# Patient Record
Sex: Female | Born: 1945
Health system: Southern US, Community
[De-identification: ages and names within clinical notes are randomized; demographics above are authoritative.]

## PROBLEM LIST (undated history)

## (undated) DIAGNOSIS — E785 Hyperlipidemia, unspecified: Secondary | ICD-10-CM

## (undated) DIAGNOSIS — G20 Parkinson's disease: Secondary | ICD-10-CM

## (undated) DIAGNOSIS — M1990 Unspecified osteoarthritis, unspecified site: Secondary | ICD-10-CM

## (undated) DIAGNOSIS — E7800 Pure hypercholesterolemia, unspecified: Secondary | ICD-10-CM

## (undated) DIAGNOSIS — F419 Anxiety disorder, unspecified: Secondary | ICD-10-CM

## (undated) DIAGNOSIS — K589 Irritable bowel syndrome without diarrhea: Secondary | ICD-10-CM

## (undated) DIAGNOSIS — G2581 Restless legs syndrome: Secondary | ICD-10-CM

## (undated) DIAGNOSIS — I639 Cerebral infarction, unspecified: Secondary | ICD-10-CM

## (undated) DIAGNOSIS — G20A1 Parkinson's disease without dyskinesia, without mention of fluctuations: Secondary | ICD-10-CM

## (undated) DIAGNOSIS — G9050 Complex regional pain syndrome I, unspecified: Secondary | ICD-10-CM

## (undated) HISTORY — PX: BACK SURGERY: SHX140

## (undated) HISTORY — DX: Parkinson's disease without dyskinesia, without mention of fluctuations: G20.A1

## (undated) HISTORY — PX: APPENDECTOMY: SHX54

## (undated) HISTORY — PX: CATARACT EXTRACTION: SUR2

## (undated) HISTORY — PX: COLONOSCOPY: SHX174

## (undated) HISTORY — DX: Parkinson's disease: G20

## (undated) HISTORY — PX: EYE SURGERY: SHX253

## (undated) HISTORY — PX: FRACTURE SURGERY: SHX138

---

## 2005-02-15 ENCOUNTER — Ambulatory Visit: Payer: Self-pay | Admitting: Internal Medicine

## 2005-05-03 ENCOUNTER — Ambulatory Visit: Payer: Self-pay

## 2005-11-27 ENCOUNTER — Ambulatory Visit: Payer: Self-pay | Admitting: Physician Assistant

## 2005-11-30 ENCOUNTER — Observation Stay (HOSPITAL_COMMUNITY): Admission: EM | Admit: 2005-11-30 | Discharge: 2005-12-01 | Payer: Self-pay | Admitting: Emergency Medicine

## 2006-03-25 ENCOUNTER — Ambulatory Visit: Payer: Self-pay | Admitting: Internal Medicine

## 2007-04-07 ENCOUNTER — Ambulatory Visit: Payer: Self-pay | Admitting: Internal Medicine

## 2008-05-03 ENCOUNTER — Ambulatory Visit: Payer: Self-pay | Admitting: Internal Medicine

## 2008-11-25 HISTORY — PX: PARS PLANA VITRECTOMY: SHX2166

## 2009-05-04 ENCOUNTER — Ambulatory Visit: Payer: Self-pay | Admitting: Internal Medicine

## 2010-06-13 ENCOUNTER — Ambulatory Visit: Payer: Self-pay | Admitting: Internal Medicine

## 2010-06-25 ENCOUNTER — Ambulatory Visit: Payer: Self-pay | Admitting: Internal Medicine

## 2011-01-01 ENCOUNTER — Ambulatory Visit: Payer: Self-pay | Admitting: Internal Medicine

## 2011-07-23 ENCOUNTER — Ambulatory Visit: Payer: Self-pay | Admitting: Internal Medicine

## 2012-07-23 ENCOUNTER — Ambulatory Visit: Payer: Self-pay | Admitting: Internal Medicine

## 2013-07-27 ENCOUNTER — Ambulatory Visit: Payer: Self-pay | Admitting: Internal Medicine

## 2013-08-06 ENCOUNTER — Ambulatory Visit: Payer: Self-pay | Admitting: Internal Medicine

## 2013-08-16 ENCOUNTER — Ambulatory Visit: Payer: Self-pay | Admitting: Family Medicine

## 2013-08-16 HISTORY — PX: BREAST BIOPSY: SHX20

## 2013-08-17 LAB — PATHOLOGY REPORT

## 2014-06-24 DIAGNOSIS — G8929 Other chronic pain: Secondary | ICD-10-CM | POA: Insufficient documentation

## 2014-06-24 DIAGNOSIS — F419 Anxiety disorder, unspecified: Secondary | ICD-10-CM | POA: Insufficient documentation

## 2014-06-24 DIAGNOSIS — G2581 Restless legs syndrome: Secondary | ICD-10-CM | POA: Insufficient documentation

## 2014-09-06 ENCOUNTER — Ambulatory Visit: Payer: Self-pay | Admitting: Family Medicine

## 2014-12-23 DIAGNOSIS — E7800 Pure hypercholesterolemia, unspecified: Secondary | ICD-10-CM | POA: Insufficient documentation

## 2015-06-20 ENCOUNTER — Ambulatory Visit
Admission: RE | Admit: 2015-06-20 | Discharge: 2015-06-20 | Disposition: A | Discharge status: Home / Self Care | Payer: PPO | Source: Ambulatory Visit | Attending: Gastroenterology | Admitting: Gastroenterology

## 2015-06-20 ENCOUNTER — Encounter
Admission: RE | Disposition: A | Discharge status: Home / Self Care | Payer: Self-pay | Source: Ambulatory Visit | Attending: Gastroenterology

## 2015-06-20 ENCOUNTER — Ambulatory Visit: Payer: PPO | Admitting: *Deleted

## 2015-06-20 DIAGNOSIS — F419 Anxiety disorder, unspecified: Secondary | ICD-10-CM | POA: Insufficient documentation

## 2015-06-20 DIAGNOSIS — E785 Hyperlipidemia, unspecified: Secondary | ICD-10-CM | POA: Insufficient documentation

## 2015-06-20 DIAGNOSIS — Z1211 Encounter for screening for malignant neoplasm of colon: Secondary | ICD-10-CM | POA: Insufficient documentation

## 2015-06-20 DIAGNOSIS — Z79899 Other long term (current) drug therapy: Secondary | ICD-10-CM | POA: Insufficient documentation

## 2015-06-20 DIAGNOSIS — K589 Irritable bowel syndrome without diarrhea: Secondary | ICD-10-CM | POA: Diagnosis not present

## 2015-06-20 DIAGNOSIS — K5730 Diverticulosis of large intestine without perforation or abscess without bleeding: Secondary | ICD-10-CM | POA: Insufficient documentation

## 2015-06-20 DIAGNOSIS — G9050 Complex regional pain syndrome I, unspecified: Secondary | ICD-10-CM | POA: Insufficient documentation

## 2015-06-20 DIAGNOSIS — G2581 Restless legs syndrome: Secondary | ICD-10-CM | POA: Insufficient documentation

## 2015-06-20 HISTORY — DX: Hyperlipidemia, unspecified: E78.5

## 2015-06-20 HISTORY — DX: Restless legs syndrome: G25.81

## 2015-06-20 HISTORY — PX: COLONOSCOPY WITH PROPOFOL: SHX5780

## 2015-06-20 HISTORY — DX: Complex regional pain syndrome I, unspecified: G90.50

## 2015-06-20 HISTORY — DX: Irritable bowel syndrome, unspecified: K58.9

## 2015-06-20 HISTORY — DX: Anxiety disorder, unspecified: F41.9

## 2015-06-20 SURGERY — COLONOSCOPY WITH PROPOFOL
Anesthesia: General

## 2015-06-20 MED ORDER — EPHEDRINE SULFATE 50 MG/ML IJ SOLN
INTRAMUSCULAR | Status: DC | PRN
  Administered 2015-06-20: 10 mg via INTRAVENOUS

## 2015-06-20 MED ORDER — SODIUM CHLORIDE 0.9 % IV SOLN: INTRAVENOUS | Status: DC

## 2015-06-20 MED ORDER — LACTATED RINGERS IV SOLN
INTRAVENOUS | Status: DC | PRN
  Administered 2015-06-20: 08:00:00 via INTRAVENOUS

## 2015-06-20 MED ORDER — SODIUM CHLORIDE 0.9 % IV SOLN
INTRAVENOUS | Status: DC
  Administered 2015-06-20: 1000 mL via INTRAVENOUS

## 2015-06-20 MED ORDER — PROPOFOL INFUSION 10 MG/ML OPTIME
INTRAVENOUS | Status: DC | PRN
  Administered 2015-06-20: 30 mL via INTRAVENOUS

## 2015-06-20 MED ORDER — PHENYLEPHRINE HCL 10 MG/ML IJ SOLN
INTRAMUSCULAR | Status: DC | PRN
  Administered 2015-06-20: 50 ug via INTRAVENOUS

## 2015-06-20 NOTE — Op Note (Signed)
[facility] Patient Name: Deborah Velazquez Procedure Date: 06/20/2015 7:30 AM MRN: [id number] Account #: [id number] Date of Birth: Apr 05, 1946 Admit Type: Outpatient Age: 69 Room: [facility] ENDO ROOM 4 Gender: Female Note Status: Finalized Procedure:         Colonoscopy Indications:       Screening for colorectal malignant neoplasm Providers:         Lollie Sails, MD Referring MD:      Caprice Renshaw (Referring MD) Medicines:         Monitored Anesthesia Care Complications:     No immediate complications. Procedure:         Pre-Anesthesia Assessment:                    - ASA Grade Assessment: II - A patient with mild systemic                     disease.                    After obtaining informed consent, the colonoscope was                     passed under direct vision. Throughout the procedure, the                     patient's blood pressure, pulse, and oxygen saturations                     were monitored continuously. The Colonoscope was                     introduced through the anus and advanced to the the cecum,                     identified by appendiceal orifice and ileocecal valve. The                     colonoscopy was unusually difficult due to a tortuous                     colon. Successful completion of the procedure was aided by                     changing the patient to a supine position and using manual                     pressure. The patient tolerated the procedure well. The                     quality of the bowel preparation was good. Findings:      A few small-mouthed diverticula were found in the transverse colon.      The exam was otherwise without abnormality.      The digital rectal exam was normal.      Non-bleeding internal hemorrhoids were found during anoscopy. The       hemorrhoids were small.      A few small angioectasias without bleeding were found in the rectum,       deeep to the mucosa. Impression:         - Diverticulosis in the transverse colon.                    - The examination was otherwise normal.                    -  No specimens collected. Recommendation:    - Repeat colonoscopy in 10 years for screening purposes. Procedure Code(s): --- Professional ---                    [id number], Colonoscopy, flexible; diagnostic, including                     collection of specimen(s) by brushing or washing, when                     performed (separate procedure) Diagnosis Code(s): --- Professional ---                    V76.51, Special screening for malignant neoplasms of colon                    562.10, Diverticulosis of colon (without mention of                     hemorrhage) CPT copyright 2014 American Medical Association. All rights reserved. The codes documented in this report are preliminary and upon coder review may  be revised to meet current compliance requirements. Lollie Sails, MD 06/20/2015 8:50:38 AM This report has been signed electronically. Number of Addenda: 0 Note Initiated On: 06/20/2015 7:30 AM Scope Withdrawal Time: 0 hours 6 minutes 54 seconds  Total Procedure Duration: 0 hours 33 minutes 28 seconds       [facility]

## 2015-06-20 NOTE — Anesthesia Postprocedure Evaluation (Signed)
  Anesthesia Post-op Note  Patient: Deborah Velazquez  Procedure(s) Performed: Procedure(s): COLONOSCOPY WITH PROPOFOL (N/A)  Anesthesia type:General  Patient location: PACU  Post pain: Pain level controlled  Post assessment: Post-op Vital signs reviewed, Patient's Cardiovascular Status Stable, Respiratory Function Stable, Patent Airway and No signs of Nausea or vomiting  Post vital signs: Reviewed and stable  Last Vitals:  Filed Vitals:   06/20/15 0920  BP: 116/74  Pulse: 64  Temp:   Resp: 20    Level of consciousness: awake, alert  and patient cooperative  Complications: No apparent anesthesia complications

## 2015-06-20 NOTE — Anesthesia Preprocedure Evaluation (Signed)
Anesthesia Evaluation  Patient identified by MRN, date of birth, ID band Patient awake    Reviewed: Allergy & Precautions, NPO status , Patient's Chart, lab work & pertinent test results  Airway Mallampati: II       Dental no notable dental hx.    Pulmonary neg pulmonary ROS,    Pulmonary exam normal       Cardiovascular negative cardio ROS Normal cardiovascular exam    Neuro/Psych    GI/Hepatic negative GI ROS, Neg liver ROS,   Endo/Other  negative endocrine ROS  Renal/GU negative Renal ROS     Musculoskeletal negative musculoskeletal ROS (+)   Abdominal Normal abdominal exam  (+)   Peds negative pediatric ROS (+)  Hematology negative hematology ROS (+)   Anesthesia Other Findings   Reproductive/Obstetrics negative OB ROS                             Anesthesia Physical Anesthesia Plan  ASA: II  Anesthesia Plan: General   Post-op Pain Management:    Induction: Intravenous  Airway Management Planned: Nasal Cannula  Additional Equipment:   Intra-op Plan:   Post-operative Plan:   Informed Consent: I have reviewed the patients History and Physical, chart, labs and discussed the procedure including the risks, benefits and alternatives for the proposed anesthesia with the patient or authorized representative who has indicated his/her understanding and acceptance.     Plan Discussed with: CRNA  Anesthesia Plan Comments:         Anesthesia Quick Evaluation

## 2015-06-20 NOTE — H&P (Signed)
Outpatient short stay form Pre-procedure 06/20/2015 8:01 AM Lollie Sails MD  Primary Physician: Dr. [name]  Reason for visit:  Colonoscopy  History of present illness:  Patient is a 69 year old female presenting today for screening colonoscopy. Her last colonoscopy was in 2006. She is never had polyps on a colonoscopy. There is no family history of colon cancer or colon polyps. She tolerated her prep well. She takes no aspirin's or anticoagulation products.    Current facility-administered medications:  .  0.9 %  sodium chloride infusion, , Intravenous, Continuous, Lollie Sails, MD, Last Rate: 20 mL/hr at 06/20/15 0726, 1,000 mL at 06/20/15 0726 .  0.9 %  sodium chloride infusion, , Intravenous, Continuous, Lollie Sails, MD  Prescriptions prior to admission  Medication Sig Dispense Refill Last Dose  . ALPRAZolam (XANAX) 0.5 MG tablet Take 0.5 mg by mouth at bedtime as needed for anxiety.   06/19/2015  . atorvastatin (LIPITOR) 20 MG tablet Take 20 mg by mouth daily.   06/19/2015  . co-enzyme Q-10 30 MG capsule Take 30 mg by mouth 3 (three) times daily.   06/19/2015  . ibuprofen (ADVIL,MOTRIN) 200 MG tablet Take 200 mg by mouth every 6 (six) hours as needed.   06/19/2015  . omega-3 fish oil (MAXEPA) 1000 MG CAPS capsule Take 1 capsule by mouth 2 (two) times daily.   06/19/2015  . pramipexole (MIRAPEX) 0.5 MG tablet Take 0.5 mg by mouth 3 (three) times daily.   06/19/2015  . sertraline (ZOLOFT) 100 MG tablet Take 100 mg by mouth daily.   06/19/2015     Allergies  Allergen Reactions  . Sudafed [Pseudoephedrine]      Past Medical History  Diagnosis Date  . Anxiety   . Hyperlipemia   . Restless leg syndrome   . Irritable bowel syndrome   . RSD (reflex sympathetic dystrophy)     Review of systems:      Physical Exam    Heart and lungs: Regular rate and rhythm without rub or gallop, lungs are bilaterally clear    HEENT: Norm cephalic atraumatic eyes are  anicteric    Other:     Pertinant exam for procedure: Soft nontender nondistended bowel sounds positive normoactive    Planned proceedures: Colonoscopy and indicated procedures I have discussed the risks benefits and complications of procedures to include not limited to bleeding, infection, perforation and the risk of sedation and the patient wishes to proceed.    Lollie Sails, MD Gastroenterology 06/20/2015  8:01 AM

## 2015-06-20 NOTE — Transfer of Care (Signed)
Immediate Anesthesia Transfer of Care Note  Patient: Deborah Velazquez  Procedure(s) Performed: Procedure(s): COLONOSCOPY WITH PROPOFOL (N/A)  Patient Location: PACU  Anesthesia Type:General  Level of Consciousness: sedated  Airway & Oxygen Therapy: Patient Spontanous Breathing  Post-op Assessment: Report given to RN and Post -op Vital signs reviewed and stable  Post vital signs: Reviewed and stable  Last Vitals:  Filed Vitals:   06/20/15 0920  BP: 116/74  Pulse: 64  Temp:   Resp: 20    Complications: No apparent anesthesia complications

## 2015-06-23 ENCOUNTER — Encounter: Payer: Self-pay | Admitting: Gastroenterology

## 2015-08-04 ENCOUNTER — Other Ambulatory Visit: Payer: Self-pay | Admitting: Family Medicine

## 2015-08-04 DIAGNOSIS — Z1231 Encounter for screening mammogram for malignant neoplasm of breast: Secondary | ICD-10-CM

## 2015-09-08 ENCOUNTER — Ambulatory Visit
Admission: RE | Admit: 2015-09-08 | Discharge: 2015-09-08 | Disposition: A | Discharge status: Home / Self Care | Payer: PPO | Source: Ambulatory Visit | Attending: Family Medicine | Admitting: Family Medicine

## 2015-09-08 DIAGNOSIS — Z1231 Encounter for screening mammogram for malignant neoplasm of breast: Secondary | ICD-10-CM

## 2016-01-10 DIAGNOSIS — E7800 Pure hypercholesterolemia, unspecified: Secondary | ICD-10-CM | POA: Diagnosis not present

## 2016-01-10 DIAGNOSIS — R7301 Impaired fasting glucose: Secondary | ICD-10-CM | POA: Diagnosis not present

## 2016-01-10 DIAGNOSIS — Z79899 Other long term (current) drug therapy: Secondary | ICD-10-CM | POA: Diagnosis not present

## 2016-01-18 DIAGNOSIS — G479 Sleep disorder, unspecified: Secondary | ICD-10-CM | POA: Diagnosis not present

## 2016-01-18 DIAGNOSIS — Z79899 Other long term (current) drug therapy: Secondary | ICD-10-CM | POA: Diagnosis not present

## 2016-01-18 DIAGNOSIS — F411 Generalized anxiety disorder: Secondary | ICD-10-CM | POA: Diagnosis not present

## 2016-01-18 DIAGNOSIS — E7800 Pure hypercholesterolemia, unspecified: Secondary | ICD-10-CM | POA: Diagnosis not present

## 2016-01-18 DIAGNOSIS — R739 Hyperglycemia, unspecified: Secondary | ICD-10-CM | POA: Diagnosis not present

## 2016-03-13 DIAGNOSIS — L821 Other seborrheic keratosis: Secondary | ICD-10-CM | POA: Diagnosis not present

## 2016-03-13 DIAGNOSIS — D1801 Hemangioma of skin and subcutaneous tissue: Secondary | ICD-10-CM | POA: Diagnosis not present

## 2016-06-28 DIAGNOSIS — R0789 Other chest pain: Secondary | ICD-10-CM | POA: Diagnosis not present

## 2016-06-28 DIAGNOSIS — F411 Generalized anxiety disorder: Secondary | ICD-10-CM | POA: Diagnosis not present

## 2016-07-19 DIAGNOSIS — R739 Hyperglycemia, unspecified: Secondary | ICD-10-CM | POA: Diagnosis not present

## 2016-07-19 DIAGNOSIS — Z79899 Other long term (current) drug therapy: Secondary | ICD-10-CM | POA: Diagnosis not present

## 2016-07-19 DIAGNOSIS — E7800 Pure hypercholesterolemia, unspecified: Secondary | ICD-10-CM | POA: Diagnosis not present

## 2016-07-26 DIAGNOSIS — Z0000 Encounter for general adult medical examination without abnormal findings: Secondary | ICD-10-CM | POA: Diagnosis not present

## 2016-07-26 DIAGNOSIS — R7303 Prediabetes: Secondary | ICD-10-CM | POA: Insufficient documentation

## 2016-07-26 DIAGNOSIS — Z79899 Other long term (current) drug therapy: Secondary | ICD-10-CM | POA: Diagnosis not present

## 2016-07-26 DIAGNOSIS — R739 Hyperglycemia, unspecified: Secondary | ICD-10-CM | POA: Diagnosis not present

## 2016-07-26 DIAGNOSIS — E7800 Pure hypercholesterolemia, unspecified: Secondary | ICD-10-CM | POA: Diagnosis not present

## 2016-10-08 DIAGNOSIS — M5441 Lumbago with sciatica, right side: Secondary | ICD-10-CM | POA: Diagnosis not present

## 2016-10-08 DIAGNOSIS — F411 Generalized anxiety disorder: Secondary | ICD-10-CM | POA: Diagnosis not present

## 2016-11-26 ENCOUNTER — Other Ambulatory Visit: Payer: Self-pay | Admitting: Family Medicine

## 2016-11-26 DIAGNOSIS — Z1231 Encounter for screening mammogram for malignant neoplasm of breast: Secondary | ICD-10-CM

## 2016-12-24 ENCOUNTER — Ambulatory Visit
Admission: RE | Admit: 2016-12-24 | Discharge: 2016-12-24 | Disposition: A | Discharge status: Home / Self Care | Payer: PPO | Source: Ambulatory Visit | Attending: Family Medicine | Admitting: Family Medicine

## 2016-12-24 DIAGNOSIS — Z1231 Encounter for screening mammogram for malignant neoplasm of breast: Secondary | ICD-10-CM | POA: Diagnosis not present

## 2017-01-10 DIAGNOSIS — H35372 Puckering of macula, left eye: Secondary | ICD-10-CM | POA: Diagnosis not present

## 2017-01-16 DIAGNOSIS — Z79899 Other long term (current) drug therapy: Secondary | ICD-10-CM | POA: Diagnosis not present

## 2017-01-16 DIAGNOSIS — R739 Hyperglycemia, unspecified: Secondary | ICD-10-CM | POA: Diagnosis not present

## 2017-01-16 DIAGNOSIS — E7800 Pure hypercholesterolemia, unspecified: Secondary | ICD-10-CM | POA: Diagnosis not present

## 2017-01-23 DIAGNOSIS — R739 Hyperglycemia, unspecified: Secondary | ICD-10-CM | POA: Diagnosis not present

## 2017-01-23 DIAGNOSIS — F411 Generalized anxiety disorder: Secondary | ICD-10-CM | POA: Diagnosis not present

## 2017-01-23 DIAGNOSIS — E7800 Pure hypercholesterolemia, unspecified: Secondary | ICD-10-CM | POA: Diagnosis not present

## 2017-01-23 DIAGNOSIS — G2581 Restless legs syndrome: Secondary | ICD-10-CM | POA: Diagnosis not present

## 2017-01-23 DIAGNOSIS — R202 Paresthesia of skin: Secondary | ICD-10-CM | POA: Diagnosis not present

## 2017-01-23 DIAGNOSIS — Z79899 Other long term (current) drug therapy: Secondary | ICD-10-CM | POA: Diagnosis not present

## 2017-03-06 DIAGNOSIS — J309 Allergic rhinitis, unspecified: Secondary | ICD-10-CM | POA: Diagnosis not present

## 2017-03-13 DIAGNOSIS — D1801 Hemangioma of skin and subcutaneous tissue: Secondary | ICD-10-CM | POA: Diagnosis not present

## 2017-03-13 DIAGNOSIS — L821 Other seborrheic keratosis: Secondary | ICD-10-CM | POA: Diagnosis not present

## 2017-07-22 DIAGNOSIS — R202 Paresthesia of skin: Secondary | ICD-10-CM | POA: Diagnosis not present

## 2017-07-22 DIAGNOSIS — E7800 Pure hypercholesterolemia, unspecified: Secondary | ICD-10-CM | POA: Diagnosis not present

## 2017-07-22 DIAGNOSIS — R739 Hyperglycemia, unspecified: Secondary | ICD-10-CM | POA: Diagnosis not present

## 2017-07-22 DIAGNOSIS — Z79899 Other long term (current) drug therapy: Secondary | ICD-10-CM | POA: Diagnosis not present

## 2017-07-29 DIAGNOSIS — R2689 Other abnormalities of gait and mobility: Secondary | ICD-10-CM | POA: Diagnosis not present

## 2017-07-29 DIAGNOSIS — Z0000 Encounter for general adult medical examination without abnormal findings: Secondary | ICD-10-CM | POA: Diagnosis not present

## 2017-08-19 DIAGNOSIS — R2681 Unsteadiness on feet: Secondary | ICD-10-CM | POA: Diagnosis not present

## 2017-08-19 DIAGNOSIS — G20 Parkinson's disease: Secondary | ICD-10-CM | POA: Diagnosis not present

## 2017-08-19 DIAGNOSIS — R251 Tremor, unspecified: Secondary | ICD-10-CM | POA: Insufficient documentation

## 2017-08-19 DIAGNOSIS — G20A1 Parkinson's disease without dyskinesia, without mention of fluctuations: Secondary | ICD-10-CM | POA: Insufficient documentation

## 2017-09-22 DIAGNOSIS — G20 Parkinson's disease: Secondary | ICD-10-CM | POA: Diagnosis not present

## 2017-09-22 DIAGNOSIS — R2681 Unsteadiness on feet: Secondary | ICD-10-CM | POA: Diagnosis not present

## 2017-09-22 DIAGNOSIS — M79604 Pain in right leg: Secondary | ICD-10-CM | POA: Diagnosis not present

## 2017-09-22 DIAGNOSIS — R251 Tremor, unspecified: Secondary | ICD-10-CM | POA: Diagnosis not present

## 2017-09-22 DIAGNOSIS — R4189 Other symptoms and signs involving cognitive functions and awareness: Secondary | ICD-10-CM | POA: Insufficient documentation

## 2017-09-22 DIAGNOSIS — M79605 Pain in left leg: Secondary | ICD-10-CM

## 2017-09-22 DIAGNOSIS — R413 Other amnesia: Secondary | ICD-10-CM | POA: Diagnosis not present

## 2017-12-01 ENCOUNTER — Encounter: Payer: Self-pay | Admitting: Neurology

## 2017-12-10 ENCOUNTER — Encounter: Payer: Self-pay | Admitting: Neurology

## 2017-12-12 NOTE — Progress Notes (Signed)
Deborah Velazquez was seen today in the movement disorders clinic for neurologic consultation at the request of [name], MD.  The consultation is for the evaluation of tremor and to r/o PD.  Pt previously seen Dr. [name].  The records that were made available to me were reviewed.  This patient is accompanied in the office by her spouse who supplements the history.   Patient first saw Dr. [name] on August 19, 2017.  She was diagnosed with Parkinson's disease at that visit and started on carbidopa/levodopa 25/100, 1 tablet 3 times per day.  She followed up in October, 2018 and stated that the medication definitely helped, but complained of fatigue.  She denies today that it was of benefit.  He recommended that she change to carbidopa/levodopa 25/100 CR, 1 tablet twice per day (8am/8pm). Her husband states that this also "turned her into a zombie."   He also recommended donepezil.  She states that she was not prescribed that and she doesn't know what that was for.  Specific Symptoms:  Tremor: No. Family hx of similar:  No. Voice: gotten weaker  Sleep: sleeps well  Vivid Dreams:  Yes.    Acting out dreams:  Yes.  , but very rarely screams out Wet Pillows: No. Postural symptoms:  Yes.    Falls?  No. Bradykinesia symptoms: shuffling gait, slow movements and difficulty getting out of a chair Loss of smell:  Yes.  x 5 years Loss of taste:  Yes.   Urinary Incontinence:  Yes.  , occasionally has to wear undergarments Difficulty Swallowing:  No. Handwriting, micrographia: Yes.   Trouble with ADL's:  No.  Trouble buttoning clothing: No. Depression:  No., but admits to anxiety - rarely takes xanax - last refilled in 2016 per patient Memory changes:  Yes.   (can forget recipes, can forget things in a list and patients husband attributes it to levodopa; prepares own pill box and remembers to take them with phone alarm; does drive and husband states that she does well with  that) Hallucinations:  No.  visual distortions: No. N/V:  No. Lightheaded:  No.  Syncope: No. Diplopia:  No. Dyskinesia:  No.  Neuroimaging of the brain has not previously been performed.  On pramipexole 1 mg for RLS x 10-20 years per patient.   ALLERGIES:   Allergies  Allergen Reactions  . Pseudoephedrine Hcl Other (See Comments)    Other Reaction: nervous  . Sudafed [Pseudoephedrine]     CURRENT MEDICATIONS:  Outpatient Encounter Medications as of 12/16/2017  Medication Sig  . ALPRAZolam (XANAX) 0.5 MG tablet Take 0.25 mg by mouth as needed for anxiety.   Marland Kitchen atorvastatin (LIPITOR) 20 MG tablet Take 20 mg by mouth daily.  . Carbidopa-Levodopa ER (SINEMET CR) 25-100 MG tablet controlled release Take 1 tablet by mouth 2 (two) times daily.  Marland Kitchen co-enzyme Q-10 30 MG capsule Take 30 mg by mouth 3 (three) times daily.  . Cyanocobalamin (B-12 PO) Take 200 mg by mouth daily.  Marland Kitchen ibuprofen (ADVIL,MOTRIN) 200 MG tablet Take 200 mg by mouth every 6 (six) hours as needed.  Marland Kitchen omega-3 fish oil (MAXEPA) 1000 MG CAPS capsule Take 1 capsule by mouth 2 (two) times daily.  . pramipexole (MIRAPEX) 0.5 MG tablet Take 1 mg by mouth at bedtime.   . sertraline (ZOLOFT) 100 MG tablet Take 100 mg by mouth daily.  . [DISCONTINUED] aspirin EC 81 MG tablet Take 81 mg by mouth daily.  . [DISCONTINUED] cetirizine (ZYRTEC) 10  MG tablet Take 10 mg by mouth daily.  . [DISCONTINUED] fluticasone (FLONASE) 50 MCG/ACT nasal spray Place into both nostrils daily.   No facility-administered encounter medications on file as of 12/16/2017.     PAST MEDICAL HISTORY:   Past Medical History:  Diagnosis Date  . Anxiety   . Hyperlipemia   . Irritable bowel syndrome   . Restless leg syndrome   . RSD (reflex sympathetic dystrophy)     PAST SURGICAL HISTORY:   Past Surgical History:  Procedure Laterality Date  . APPENDECTOMY    . BREAST BIOPSY Left 08/16/2013   neg  . COLONOSCOPY    . COLONOSCOPY WITH PROPOFOL N/A  06/20/2015   Procedure: COLONOSCOPY WITH PROPOFOL;  Surgeon: Martin U Skulskie, MD;  Location: [facility] ENDOSCOPY;  Service: Endoscopy;  Laterality: N/A;  . FRACTURE SURGERY      SOCIAL HISTORY:   Social History   Socioeconomic History  . Marital status: Married    Spouse name: Not on file  . Number of children: Not on file  . Years of education: Not on file  . Highest education level: Not on file  Social Needs  . Financial resource strain: Not on file  . Food insecurity - worry: Not on file  . Food insecurity - inability: Not on file  . Transportation needs - medical: Not on file  . Transportation needs - non-medical: Not on file  Occupational History  . Not on file  Tobacco Use  . Smoking status: Never Smoker  . Smokeless tobacco: Never Used  Substance and Sexual Activity  . Alcohol use: Yes    Comment: 1-2 x a week  . Drug use: No  . Sexual activity: Not on file  Other Topics Concern  . Not on file  Social History Narrative  . Not on file    FAMILY HISTORY:   Family Status  Relation Name Status  . Mother  Deceased  . Father  Deceased    ROS:  R foot burning.  A complete 10 system review of systems was obtained and was unremarkable apart from what is mentioned above.  PHYSICAL EXAMINATION:    VITALS:   Vitals:   12/16/17 0826  BP: 100/62  Pulse: 80  SpO2: 95%  Weight: 156 lb (70.8 kg)  Height: 5' 6" (1.676 m)    GEN:  The patient appears stated age and is in NAD. HEENT:  Normocephalic, atraumatic.  The mucous membranes are moist. The superficial temporal arteries are without ropiness or tenderness. CV:  RRR Lungs:  CTAB Neck/HEME:  There are no carotid bruits bilaterally.  Neurological examination:  Orientation:  Montreal Cognitive Assessment  12/16/2017  Visuospatial/ Executive (0/5) 3  Naming (0/3) 2  Attention: Read list of digits (0/2) 2  Attention: Read list of letters (0/1) 1  Attention: Serial 7 subtraction starting at 100 (0/3) 1  Language:  Repeat phrase (0/2) 2  Language : Fluency (0/1) 1  Abstraction (0/2) 1  Delayed Recall (0/5) 4  Orientation (0/6) 6  Total 23  Adjusted Score (based on education) 23   Cranial nerves: There is good facial symmetry. There is facial hypomimia.  Pupils are equal round and reactive to light bilaterally. Fundoscopic exam is attempted but the disc margins are not well visualized bilaterally. Extraocular muscles are intact. The visual fields are full to confrontational testing. The speech is fluent and clear.   She is hypophonic.  Soft palate rises symmetrically and there is no tongue deviation. Hearing is intact   to conversational tone. Sensation: Sensation is intact to light and pinprick throughout (facial, trunk, extremities). Vibration is intact at the bilateral big toe. There is no extinction with double simultaneous stimulation. There is no sensory dermatomal level identified. Motor: Strength is 5/5 in the bilateral upper and lower extremities.   Shoulder shrug is equal and symmetric.  There is no pronator drift. Deep tendon reflexes: Deep tendon reflexes are 3/4 at the bilateral biceps, triceps, brachioradialis, patella and achilles. Plantar responses are downgoing bilaterally.  Movement examination: Tone: There is mild increased tone in the RUE and bilateral lower extremities.   Abnormal movements: none even with distraction Coordination:  There is  decremation with RAM's, with any form of RAMS, including alternating supination and pronation of the forearm, hand opening and closing, finger taps, heel taps and toe taps on the right Gait and Station: The patient has difficulty arising out of a deep-seated chair without the use of the hands and tries multiple times and is unable.  She ultimately pushes off of the chair. The patient's stride length is initially fairly good but ultimately it decreases and she drags the right leg and becomes somewhat unsteady.      ASSESSMENT/PLAN:  1.  Idiopathic  Parkinson's disease.    -We discussed the diagnosis as well as pathophysiology of the disease.  We discussed treatment options as well as prognostic indicators.  Patient education was provided.  -We discussed that it used to be thought that levodopa would increase risk of melanoma but now it is believed that Parkinsons itself likely increases risk of melanoma. she is to get regular skin checks.  -we will do an MRI brain given hyperreflexia  -Greater than 50% of the 60 minute visit was spent in counseling answering questions and talking about what to expect now as well as in the future.  We talked about medication options as well as potential future surgical options.  We talked about safety in the home.  -I am not so convinced that her carbidopa/levodopa 25/100 is causing memory change as she thinks.  She has changed to carbidopa/levodopa CR and is taking it bid but they still think that it causes memory change.  I will take her off of it for now as they don't think that it has been helpful and just use it at night for RLS.  She will take carbidopa/levodopa 50/200 q hs.  I will hopefully be able to get her back on the IR version in the future, once we are able to likely prove that memory change is independent of memory.    -wean off of pramipexole 1 mg q hs that she has used for years given memory change  -We discussed community resources in the area including patient support groups and community exercise programs for PD and pt education was provided to the patient.  -met with our social worker today   2.  Memory change  -will send for neurocognitive testing  3.  Follow up is anticipated in the next few months, sooner should new neurologic issues arise.     Cc:  [name], MD

## 2017-12-16 ENCOUNTER — Encounter: Payer: Self-pay | Admitting: Neurology

## 2017-12-16 ENCOUNTER — Ambulatory Visit: Payer: PPO | Admitting: Neurology

## 2017-12-16 ENCOUNTER — Encounter: Payer: Self-pay | Admitting: Psychology

## 2017-12-16 VITALS — BP 100/62 | HR 80 | Ht 66.0 in | Wt 156.0 lb

## 2017-12-16 DIAGNOSIS — R413 Other amnesia: Secondary | ICD-10-CM

## 2017-12-16 DIAGNOSIS — R292 Abnormal reflex: Secondary | ICD-10-CM

## 2017-12-16 DIAGNOSIS — G2581 Restless legs syndrome: Secondary | ICD-10-CM

## 2017-12-16 DIAGNOSIS — G20 Parkinson's disease: Secondary | ICD-10-CM

## 2017-12-16 NOTE — Patient Instructions (Addendum)
1. Stop Carbidopa Levodopa 25/100 CR  Start Carbidopa Levodopa 50/200 - 1 tablet at night.   2. Stop Pramipexole as follows:  Take Pramipexole 0.5 mg - 1 tablet at night for two weeks, then stop.   3. You have been referred for a neurocognitive evaluation in our office.   The evaluation takes approximately two hours. The first part of the appointment is a clinical interview with the neuropsychologist (Dr. Macarthur Critchley). Please bring someone with you to this appointment if possible, as it is helpful for Dr. [name] to hear from both you and another adult who knows you well. After speaking with Dr. [name], you will complete testing with her technician. The testing includes a variety of tasks- mostly question-and-answer, some paper-and-pencil. There is nothing you need to do to prepare for this appointment, but having a good night's sleep prior to the testing, and bringing eyeglasses and hearing aids (if you wear them), is advised.   About a week after the evaluation, you will return to follow up with Dr. [name] to review the test results. This appointment is about 30 minutes. If you would like a family member to receive this information as well, please bring them to the appointment.   We have to reserve several hours of the neuropsychologist's time and the psychometrician's time for your evaluation appointment. As such, please note that there is a No-Show fee of $100. If you are unable to attend any of your appointments, please contact our office as soon as possible to reschedule.   4. We have sent a referral to [facility] for your MRI and they will call you directly to schedule your appt. They are located at [address],  [place]  [address]. If you need to contact them directly please call [phone].

## 2017-12-16 NOTE — Progress Notes (Signed)
I met with the patient and her husband today while they were in the clinic. I reviewed exercise and support resources in the [place] area. They did not have many questions, so I asked them to contact me with any questions or needs as they arise.

## 2017-12-18 ENCOUNTER — Telehealth: Payer: Self-pay | Admitting: Neurology

## 2017-12-18 MED ORDER — CARBIDOPA-LEVODOPA ER 50-200 MG PO TBCR: 1.0000 | EXTENDED_RELEASE_TABLET | Freq: Every day | ORAL | 1 refills | Status: DC

## 2017-12-18 NOTE — Telephone Encounter (Signed)
RX sent to [address]

## 2017-12-18 NOTE — Telephone Encounter (Signed)
Prescription was sent to the wrong pharmacy it needs to go [facility] [address],[place] [address] Phone # [phone]

## 2017-12-29 ENCOUNTER — Ambulatory Visit (INDEPENDENT_AMBULATORY_CARE_PROVIDER_SITE_OTHER): Payer: PPO | Admitting: Psychology

## 2017-12-29 ENCOUNTER — Encounter: Payer: Self-pay | Admitting: Psychology

## 2017-12-29 DIAGNOSIS — G20 Parkinson's disease: Secondary | ICD-10-CM | POA: Diagnosis not present

## 2017-12-29 DIAGNOSIS — R413 Other amnesia: Secondary | ICD-10-CM

## 2017-12-29 NOTE — Progress Notes (Signed)
NEUROBEHAVIORAL STATUS EXAM   Name: Deborah Velazquez Date of Birth: 20-Oct-1946 Date of Interview: 12/29/2017  Reason for Referral:  Deborah Velazquez is a 72 y.o. female who is referred for neuropsychological evaluation by Dr. Wells Guiles Tat of [facility] due to concerns about cognitive decline. This patient is accompanied in the office by her husband who supplements the history.  History of Presenting Problem:  Deborah Velazquez was diagnosed with PD in September 2018. She reports initial symptom of rigidity which started approximately 3 years ago. She also reported history of cognitive slowing present for about 5-6 years. She and her husband feel that her cognitive abilities abruptly declined after starting Levodopa. Levodopa was subsequently reduced to CR twice a day but they still felt it was causing memory change. The patient was seen by Dr. Carles Collet on 12/16/2017. MoCA was 23/30. She was taken off the carbidopa/levodopa during the day and is now just taking CR once at night. She thinks there has possibly been a small improvement in cognitive function since making this change. She also has been weaned off pramipexole that she was on for many years. Brain MRI was ordered but has not yet been completed.  Today (12/29/2017), the patient's husband states that while she had some cognitive decline prior to going on levodopa, it was "nothing like it is now". He stated she "got like a zombie" as soon as she went on that medication. She started having trouble understanding what he was telling her and remembering it afterwards. She continues to have difficulty processing information and remembering things she has been told. She has to concentrate much harder in conversation. She is misplacing her phone a lot. She has great difficulty with starting but not finishing tasks due to getting distracted. Her processing speed is significantly slowed. She endorses word finding difficulty. She denies any difficulty with  visual-spatial skills or navigation.   She also complains of an internal tremor, "quivering of my insides" when she gets nervous, for example about getting somewhere on time. She reports her mood fluctuates. Her husband says she is able to "put on a show" in front of other people, but then she goes back to feeling bad and wanting to be alone. She prefers to be by herself. She reported that she has had recent difficulty falling asleep due to significant itching. She is taking Benadryl for this. She has not had any hallucinations but has had brief visual illusions (seeing a flash out of the corner of her eye).   She complains of reduced balance and significant fatigue. She feels as though she has no energy. She is going to start exercising in a PD program tomorrow, which will likely be very helpful. She has not had any falls.   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 Anxiety   . Hyperlipemia   . Irritable bowel syndrome   . Restless leg syndrome   . RSD (reflex sympathetic dystrophy)    pt denies     Current Medications:  Outpatient Encounter Medications as of 12/29/2017  Medication Sig  . ALPRAZolam (XANAX) 0.5 MG tablet Take 0.25 mg by mouth as needed for anxiety.   Marland Kitchen atorvastatin (LIPITOR) 20  MG tablet Take 20 mg by mouth daily.  . carbidopa-levodopa (SINEMET CR) 50-200 MG tablet Take 1 tablet by mouth at bedtime.  Marland Kitchen co-enzyme Q-10 30 MG capsule Take 30 mg by mouth 3 (three) times daily.  . Cyanocobalamin (B-12 PO) Take 200 mg by mouth daily.  Marland Kitchen ibuprofen (ADVIL,MOTRIN) 200 MG tablet Take 200 mg by mouth every 6 (six) hours as needed.  Marland Kitchen omega-3 fish oil (MAXEPA) 1000 MG CAPS  capsule Take 1 capsule by mouth 2 (two) times daily.  . pramipexole (MIRAPEX) 0.5 MG tablet Take 1 mg by mouth at bedtime.   . sertraline (ZOLOFT) 100 MG tablet Take 100 mg by mouth daily.   No facility-administered encounter medications on file as of 12/29/2017.      Behavioral Observations:   Appearance: Neatly, casually and appropriately dressed and groomed Gait: Ambulated independently, no gross abnormalities observed Speech: Fluent; mildly reduced volume. Mild word finding difficulty. Thought process: Generally linear Affect: Blunted/masked facies, does smile on occasion Interpersonal: Pleasant, appropriate   40 minutes spent face-to-face with patient completing neurobehavioral status exam. 20 minutes spent integrating medical records/clinical data and completing this report. CPT code [phone]   TESTING: There is medical necessity to proceed with neuropsychological assessment as the results will be used to aid in differential diagnosis and clinical decision-making and to inform specific treatment recommendations. Per the patient, her husband and medical records reviewed, there has been a change in cognitive functioning and a reasonable suspicion of cognitive impairment due to PD versus due to levodopa (less likely).  Clinical Decision Making: In considering the patient's current level of functioning, level of presumed impairment, nature of symptoms, emotional and behavioral responses during the interview, level of literacy, and observed level of motivation, a battery of tests was selected and communicated to the psychometrician.    PLAN: The patient will return tomorrow to complete the above referenced full battery of neuropsychological testing with a psychometrician under my supervision. Education regarding testing procedures was provided to the patient. Subsequently, the patient will see this provider for a follow-up session at which time her test performances and my impressions and  treatment recommendations will be reviewed in detail.  Evaluation ongoing; full report to follow.

## 2017-12-30 ENCOUNTER — Ambulatory Visit (INDEPENDENT_AMBULATORY_CARE_PROVIDER_SITE_OTHER): Payer: PPO | Admitting: Psychology

## 2017-12-30 DIAGNOSIS — G20 Parkinson's disease: Secondary | ICD-10-CM

## 2017-12-30 DIAGNOSIS — G20A1 Parkinson's disease without dyskinesia, without mention of fluctuations: Secondary | ICD-10-CM

## 2017-12-30 NOTE — Progress Notes (Signed)
   Neuropsychology Note  Deborah Velazquez completed 60 minutes of neuropsychological testing with technician, Milana Kidney, BS, under the supervision of Dr. Macarthur Critchley, Licensed Psychologist. The patient did not appear overtly distressed by the testing session, per behavioral observation or via self-report to the technician. Rest breaks were offered.   Clinical Decision Making: In considering the patient's current level of functioning, level of presumed impairment, nature of symptoms, emotional and behavioral responses during the interview, level of literacy, and observed level of motivation/effort, a battery of tests was selected and communicated to the psychometrician.  Communication between the psychologist and technician was ongoing throughout the testing session and changes were made as deemed necessary based on patient performance on testing, technician observations and additional pertinent factors such as those listed above.  Deborah Velazquez will return within approximately 2 weeks for an interactive feedback session with Dr. [name] at which time her test performances, clinical impressions and treatment recommendations will be reviewed in detail. The patient understands she can contact our office should she require our assistance before this time.  15 minutes spent performing neuropsychological evaluation services/clinical decision making (psychologist). [CPT 58099] 60 minutes spent face-to-face with patient administering standardized tests, 30 minutes spent scoring (technician). [CPT [id number], 83382]  Full report to follow.

## 2018-01-04 NOTE — Progress Notes (Signed)
NEUROPSYCHOLOGICAL EVALUATION   Name:    Deborah Velazquez  Date of Birth:   03-20-1946 Date of Interview:  12/29/2017 Date of Testing:  12/30/2017   Date of Feedback:  01/06/2018       Background Information:  Reason for Referral:  Deborah Velazquez is a 72 y.o. female referred by Dr. Wells Guiles Tat to assess her current level of cognitive functioning and assist in differential diagnosis. The current evaluation consisted of a review of available medical records, an interview with the patient and her husband, and the completion of a neuropsychological testing battery. Informed consent was obtained.  History of Presenting Problem:  Deborah Velazquez was diagnosed with PD in September 2018. She reports initial symptom of rigidity which started approximately 3 years ago. She also reported history of cognitive slowing present for about 5-6 years. She and her husband feel that her cognitive abilities abruptly declined after starting Levodopa. Levodopa was subsequently reduced to CR twice a day but they still felt it was causing memory change. The patient was seen by Dr. Carles Collet on 12/16/2017. MoCA was 23/30. She was taken off the carbidopa/levodopa during the day and is now just taking CR once at night. She thinks there has possibly been a small improvement in cognitive function since making this change. She also has been weaned off pramipexole that she was on for many years. Brain MRI was ordered but has not yet been completed.  Today (12/29/2017), the patient's husband states that while she had some cognitive decline prior to going on levodopa, it was "nothing like it is now". He stated she "got like a zombie" as soon as she went on that medication. She started having trouble understanding what he was telling her and remembering it afterwards. She continues to have difficulty processing information and remembering things she has been told. She has to concentrate much harder in conversation. She is misplacing her phone a  lot. She has great difficulty with starting but not finishing tasks due to getting distracted. Her processing speed is significantly slowed. She endorses word finding difficulty. She denies any difficulty with visual-spatial skills or navigation.   She also complains of an internal tremor, "quivering of my insides" when she gets nervous, for example about getting somewhere on time. She reports her mood fluctuates. Her husband says she is able to "put on a show" in front of other people, but then she goes back to feeling bad and wanting to be alone. She prefers to be by herself. She reported that she has had recent difficulty falling asleep due to significant itching. She is taking Benadryl for this. She has not had any hallucinations but has had brief visual illusions (seeing a flash out of the corner of her eye).   She complains of reduced balance and significant fatigue. She feels as though she has no energy. She is going to start exercising in a PD program tomorrow, which will likely be very helpful. She has not had any falls.   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 Anxiety   . Hyperlipemia   . Irritable bowel syndrome   . Restless leg syndrome   . RSD (reflex sympathetic dystrophy)  pt denies    Current medications:  Outpatient Encounter Medications as of 01/06/2018  Medication Sig  . ALPRAZolam (XANAX) 0.5 MG tablet Take 0.25 mg by mouth as needed for anxiety.   Marland Kitchen atorvastatin (LIPITOR) 20 MG tablet Take 20 mg by mouth daily.  . carbidopa-levodopa (SINEMET CR) 50-200 MG tablet Take 1 tablet by mouth at bedtime.  Marland Kitchen co-enzyme Q-10 30 MG capsule Take 30 mg by mouth 3  (three) times daily.  . Cyanocobalamin (B-12 PO) Take 200 mg by mouth daily.  Marland Kitchen ibuprofen (ADVIL,MOTRIN) 200 MG tablet Take 200 mg by mouth every 6 (six) hours as needed.  Marland Kitchen omega-3 fish oil (MAXEPA) 1000 MG CAPS capsule Take 1 capsule by mouth 2 (two) times daily.  . pramipexole (MIRAPEX) 0.5 MG tablet Take 1 mg by mouth at bedtime.   . sertraline (ZOLOFT) 100 MG tablet Take 100 mg by mouth daily.   No facility-administered encounter medications on file as of 01/06/2018.      Current Examination:  Behavioral Observations:  Appearance: Neatly, casually and appropriately dressed and groomed Gait: Ambulated independently, no gross abnormalities observed Speech: Fluent; mildly reduced volume. Mild word finding difficulty. Thought process: Generally linear Affect: Blunted/masked facies, does smile on occasion Interpersonal: Pleasant, appropriate Orientation: Oriented to all spheres. Accurately named the current President and his predecessor.   Tests Administered: . Test of Premorbid Functioning (TOPF) . Wechsler Adult Intelligence Scale-Fourth Edition (WAIS-IV): Matrix Reasoning, Similarities, Block Design, Coding and Digit Span subtests . Wechsler Memory Scale-Fourth Edition (WMS-IV) Older Adult Version (ages 48-90): Logical Memory I, II and Recognition subtests  . [employer] Verbal Learning Test - 2nd Edition (CVLT-2) Short Form . Repeatable Battery for the Assessment of Neuropsychological Status (RBANS) Form A:  Figure Copy and Recall subtests and Semantic Fluency subtest . Neuropsychological Assessment Battery (NAB) Language Module, Form 1: Naming subtest . Boston Diagnostic Aphasia Examination: Complex Ideational Material and Commands subtests . Controlled Oral Word Association Test (COWAT) . Trail Making Test A and B . Clock drawing test . Beck Depression Inventory - 2nd Edition (BDI-II) . Generalized Anxiety Disorder - 7 item screener (GAD-7) . Parkinson's Disease Questionnaire  (PDQ-39)  Test Results: Note: Standardized scores are presented only for use by appropriately trained professionals and to allow for any future test-retest comparison. These scores should not be interpreted without consideration of all the information that is contained in the rest of the report. The most recent standardization samples from the test publisher or other sources were used whenever possible to derive standard scores; scores were corrected for age, gender, ethnicity and education when available.   Test Scores:   Test Name Raw Score Standardized Score Descriptor  TOPF 23/70 SS= 83 Low average  WAIS-IV Subtests     Matrix Reasoning 10/26 ss= 9 Average  Similarities 19/36 ss= 8 Average  Block Design 30/66 ss= 10 Average  Coding 46/135 ss= 9 Average  Digit Span Forward 9/16 ss= 9 Average  Digit Span Backward 7/16 ss= 9 Average  WMS-IV Subtests     LM I 18/53 ss= 5 Borderline  LM II 7/39 ss= 5 Borderline  LM II Recognition 14/23 Cum %: 3-9 Impaired  RBANS Subtests     Figure Copy 16/20 Z= -1 Low average  Figure Recall 4/20 Z= -2 Impaired  Semantic Fluency 16/40 Z= -0.7 Average  CVLT-II Scores     Trial 1 2/9 Z= -3 Severely impaired  Trial 4 6/9 Z= -1.5 Borderline  Trials 1-4 total 20/36 T= 37 Low average  SD Free Recall 7/9 Z= 0 Average  LD Free Recall 5/9 Z= -0.5 Average  LD Cued Recall 7/9 Z= 0 Average  Recognition Discriminability 9/9 hits, 2 false positives Z= 0 Average  Forced Choice Recognition 9/9  WNL  NAB Language subtests     Naming 31/31 T= 59 High average  BDAE Subtest     Complex Ideational Material 6/6  WNL  Commands 13/15  Mildly below expectation  COWAT-FAS 32 T= 42 Low average  COWAT-Animals 18 T= 50 Average   Trail Making Test A  57" 0 errors T= 40 Low average  Trail Making Test B  109" 0 errors T= 50 Average  Clock Drawing   WNL  BDI-II 12/63  WNL  GAD-7 12/21  Moderate  PDQ-39     Mobility 47.5%    Activities of Daily Living 25%      Emotional Well Being 45.8%    Stigma 18.75%    Social Support 0%    Cognitive Impairment 18.75%    Communication 16.66%    Bodily Discomfort 75%        Description of Test Results:  Premorbid verbal intellectual abilities were estimated to have been within the low average range based on a test of word reading. Psychomotor processing speed was average. Auditory attention and working memory were average. Visual-spatial construction was average to low average. Language abilities were within normal limits. Specifically, confrontation naming was high average, and semantic verbal fluency was average. Auditory comprehension of complex ideational material was within normal limits, while she did have some mild difficulty with auditory comprehension of complex multi-step commands. With regard to verbal memory, encoding and acquisition of non-contextual information (i.e., word list) was low average and below expectation overall. After a brief distracter task, however, free recall was average (7/9 items recalled). After a delay, free recall was average (5/9 items). Cued recall was average (7/9 items). Performance on a yes/no recognition task was average. On another verbal memory test, encoding and acquisition of contextual auditory information (i.e., short stories) was borderline. After a delay, free recall was borderline. Performance on a yes/no recognition task was impaired. With regard to non-verbal memory, delayed free recall of visual information was impaired. Executive functioning was within normal limits overall. Mental flexibility and set-shifting were average on Trails B. Verbal fluency with phonemic search restrictions was low average. Verbal abstract reasoning was average. Non-verbal abstract reasoning was average. Performance on a clock drawing task was within normal limits. On a self-report measure of mood, the patient's responses were not indicative of clinically significant depression at the present  time. On a self-report measure of anxiety, the patient endorsed moderate level generalized anxiety characterized by nervousness on a daily basis and difficulty relaxing, restlessness and irritability most days. She relates these symptoms to her feeling of internal tremor. On a self-report measure assessing the impact of PD symptoms on quality of life and daily functioning, the patient endorsed the most difficulty with bodily discomfort, mobility and emotional wellbeing. She did not endorse problems with social support, stigma, communication or significant cognitive impairment.   Clinical Impressions: Mild cognitive impairment, most likely secondary to Parkinson's disease.  Performances on cognitive testing suggested normal processing speed, attention, language abilities, visual spatial skills and executive functioning. Meanwhile, she demonstrated difficulty encoding and consolidating new information (both visual and verbal), but she benefited significantly from repetition and cueing, suggesting memory difficulty may be related to disruption of frontal-subcortical networks rather than hippocampal consolidation dysfunction. This pattern of memory difficulty  can certainly be seen in Parkinson's disease. While the patient and her husband report exacerbation of cognitive symptoms when she began levodopa, it is more likely that her cognitive symptoms are due to PD itself and not due to levodopa. This is supported by the fact that she continues to demonstrate mild cognitive impairment off the medication. Fortunately, test results do not indicate dementia. The most appropriate diagnosis at this time is Mild Cognitive Impairment. She does not appear to have any primary psychiatric disorder.    Recommendations/Plan: Based on the findings of the present evaluation, the following recommendations are offered:  1. The patient will benefit from repetition, cueing, and mental association strategies to compensate for  memory difficulty.  2. She is encouraged to continue her new exercise regimen as this will likely enhance physical, cognitive and emotional functioning. 3. Resuming medication for PD per Dr. Doristine Devoid recommendation is advised, given that we do not see any objective evidence of this affecting her cognitive function.  4. If a decline in cognitive functioning is reported or observed in the future, neuropsychological re-evaluation could be considered. 5. Educational information regarding cognition and PD was given to the patient.   Feedback to Patient: Deborah Velazquez and her husband returned for a feedback appointment on 01/06/2018 to review the results of her neuropsychological evaluation with this provider. 20 minutes face-to-face time was spent reviewing her test results, my impressions and my recommendations as detailed above.    Total time spent on this patient's case: 60 minutes for neurobehavioral status exam with psychologist (CPT code [id number]); 90 minutes of testing/scoring by psychometrician under psychologist's supervision (CPT codes [phone], [phone] units); 155 minutes for integration of patient data, interpretation of standardized test results and clinical data, clinical decision making, treatment planning and preparation of this report, and interactive feedback with review of results to the patient/family by psychologist (CPT codes [phone], [id number] units).      Thank you for your referral of Deborah Velazquez. Please feel free to contact me if you have any questions or concerns regarding this report.

## 2018-01-06 ENCOUNTER — Encounter: Payer: Self-pay | Admitting: Psychology

## 2018-01-06 ENCOUNTER — Ambulatory Visit (INDEPENDENT_AMBULATORY_CARE_PROVIDER_SITE_OTHER): Payer: PPO | Admitting: Psychology

## 2018-01-06 DIAGNOSIS — G20 Parkinson's disease: Secondary | ICD-10-CM | POA: Diagnosis not present

## 2018-01-06 DIAGNOSIS — G20A1 Parkinson's disease without dyskinesia, without mention of fluctuations: Secondary | ICD-10-CM

## 2018-01-06 DIAGNOSIS — G3184 Mild cognitive impairment, so stated: Secondary | ICD-10-CM

## 2018-01-06 NOTE — Patient Instructions (Signed)
  Performances on cognitive testing suggested normal processing speed, attention, language abilities, visual spatial skills and executive functioning. Meanwhile, there was some difficulty learning and remembering new information (both visual and verbal), but you benefited significantly from repetition and cueing. This pattern of memory difficulty is often seen in Parkinson's disease. It has not been shown to be due to levodopa.  Fortunately, test results do not indicate dementia. The most appropriate diagnosis at this time is Mild Cognitive Impairment (due to Parkinson's disease)    Recommendations/Plan:  1. You will benefit from repetition, cueing, and mental association strategies to compensate for memory difficulty.  2. You are encouraged to continue your new exercise regimen as this will likely enhance physical, cognitive and emotional functioning. 3. Resuming medication for PD per Dr. Doristine Devoid recommendation is advised, given that we do not see any objective evidence of this affecting cognitive function.  4. If a decline in cognitive functioning is reported or observed in the future, neuropsychological re-evaluation could be considered.

## 2018-01-09 ENCOUNTER — Telehealth: Payer: Self-pay | Admitting: Neurology

## 2018-01-09 NOTE — Telephone Encounter (Signed)
Spoke with patient. She wants to address increased anxiety with her PCP prior to adding daytime Levodopa. She will call us back and let us know what she decides.

## 2018-01-09 NOTE — Telephone Encounter (Signed)
Pt called and said she wants to have her medication changed, she is having anxiety, diarrhea and feels like a zombie, please call and advise

## 2018-01-09 NOTE — Telephone Encounter (Signed)
She is likely very underdosed as she didn't want to be on daytime medication.  She has been off daytime carbidopa/levodopa and pramipexole so they are clearing not the cause of zombie feeling she has or fatigue.  Cramping is sign she is not getting enough carbidopa/levodopa.  We could restart carbidopa/levodopa in the day if she wants and would recommend the IR version and work to 1 po tid with the carbidopa/levodopa 50/200 at bed

## 2018-01-09 NOTE — Telephone Encounter (Signed)
Spoke with patient. She was seen in January and medication was changed for her to take Carbidopa Levodopa 50/200 at bedtime only (previously taking Carbidopa Levodopa 25/100 CR twice daily).  She was also weaned off Pramipexole which patient stopped completely on 12/30/17.   She had many complaints, but after much questioning it sounds like all "side effects" have been present since starting Levodopa with Dr. [name].   Patient complains of "zombie like" feeling (which is noted in last office note) and anxiety. She states symptoms are getting worse. Also complains of nausea for the last couple days (has been on new medication since January).   She states it doesn't seem to be helping her RLS. She is having night time cramping and muscle tightening in legs to the point they are sore the next day.   Please advise.

## 2018-01-19 DIAGNOSIS — R195 Other fecal abnormalities: Secondary | ICD-10-CM | POA: Diagnosis not present

## 2018-01-19 DIAGNOSIS — E538 Deficiency of other specified B group vitamins: Secondary | ICD-10-CM | POA: Diagnosis not present

## 2018-01-19 DIAGNOSIS — E7800 Pure hypercholesterolemia, unspecified: Secondary | ICD-10-CM | POA: Diagnosis not present

## 2018-01-19 DIAGNOSIS — Z79899 Other long term (current) drug therapy: Secondary | ICD-10-CM | POA: Diagnosis not present

## 2018-01-19 DIAGNOSIS — F411 Generalized anxiety disorder: Secondary | ICD-10-CM | POA: Diagnosis not present

## 2018-01-23 DIAGNOSIS — G20 Parkinson's disease: Secondary | ICD-10-CM | POA: Diagnosis not present

## 2018-01-23 DIAGNOSIS — E7800 Pure hypercholesterolemia, unspecified: Secondary | ICD-10-CM | POA: Diagnosis not present

## 2018-01-23 DIAGNOSIS — F419 Anxiety disorder, unspecified: Secondary | ICD-10-CM | POA: Diagnosis not present

## 2018-01-23 DIAGNOSIS — R739 Hyperglycemia, unspecified: Secondary | ICD-10-CM | POA: Diagnosis not present

## 2018-02-02 ENCOUNTER — Other Ambulatory Visit: Payer: Self-pay

## 2018-02-02 NOTE — Patient Outreach (Addendum)
[place] [facility]) Care Management  02/02/2018  Deborah Velazquez 06-03-1946 [id number]   TELEPHONE SCREENING Referral date: 02/02/18 Referral source: nurse call line referral Referral reason: anxiety and panic attack  Insurance: health team advantage  PROVIDERS: Dr. [name] - primary  Dr. Carles Collet - neurologist.  Telephone call to patient regarding nurse call line referral. HIPAA verified with patient. Discussed nurse call line follow up with patient.   REFERRAL: patient states she was diagnosed with Parkinson's disease in September 2018. Patient reports recently the neurologist made adjustments to her medications and she has had anxiety and nervousness. Patient states she has also reported this to her primary MD who has been trying to wean down her medications to determine what could be causing the anxiety. Patient states she has been feeling this way since all of the adjustments have taken place with her medications. Patient states she is currently taking gabapentin 300 mg 1 tab at bedtime, sertraline 150mg   1 1/2 tab once per day, and atorvastatin 10mg  1 time per day.  Patient states she started taking the gabapentin on 01/29/18 and has been taking sertraline and atorvastatin for the last 20 years.   PARKINSON'S: Patient states she was newly diagnosed with Parkinson's disease in September 2018.  Patient verbally agrees to having follow up with [facility] for disease management/ education and follow up with pharmacist regarding her medications.   PLAN:  RNCM will refer patient to pharmacist and will be followed by telephonic case manager. RNCM will send [facility] care management welcome packet and consent to patient.  RNCM will send involvement letter to patient.  RNCM will follow up with patient within 1 week.    Quinn Plowman RN,BSN,CCM [facility] Telephonic  [phone]

## 2018-02-03 ENCOUNTER — Other Ambulatory Visit: Payer: Self-pay

## 2018-02-03 NOTE — Patient Outreach (Signed)
[facility] [facility] Care Management  02/03/2018  [name] [birth year] [id number]  72year old female referred to [place] [facility] by nurse call line referral related to anxiety and panic attacks.  [place] services requested for medication reconciliation.  Outreach call placed to Ms. Deborah Velazquez.  Patient was outside and requested a call back in 15 minutes.   Incoming call received from patient.  Appointment made to call her tomorrow morning to review medications.   [name], PharmD Clinical Pharmacist [phone]

## 2018-02-04 ENCOUNTER — Telehealth: Payer: Self-pay

## 2018-02-04 ENCOUNTER — Ambulatory Visit: Payer: Self-pay

## 2018-02-04 ENCOUNTER — Other Ambulatory Visit: Payer: Self-pay

## 2018-02-04 ENCOUNTER — Telehealth: Payer: Self-pay | Admitting: Psychology

## 2018-02-04 NOTE — Telephone Encounter (Signed)
TC received from patient. She had my contact number from the last time I met with her in the clinic.  She asked me what my function was in the clinic and then began to share her needs..  Apparently, she went off of her Parkinson's medications.  She is experiencing a lot of anxiety at this time.  She reports that the Parkinson's medication will help her with her anxiety.  I talked with her about possibly getting some counseling as it is apparent that her anxiety level is very high and it would be helpful for this to be managed.  I told her that I am more than happy to make a referral for her to get a counselor.  She said that she would think about it.  I did tell her that I would let Dr. Doristine Devoid assistant, Luvenia Starch, know that she wants to start her medications back. I also let her know that in the future I could be helpful with concerns like anxiety, but stopping and starting  medications need to be discussed with Dr. Carles Collet and she can speak to [facility] about these concerns.    I do not know the patient well, but I would be more than happy to help get her to a counselor if this would help improve her quality of life and help her live well with her Parkinson's disease.

## 2018-02-04 NOTE — Patient Outreach (Signed)
[facility]) Care Management  [place]   02/04/2018  Deborah Velazquez 1945-12-22 [id number]  72 year old female referred to [place] [facility] by nurse referral line.  [place] services requested for medication management for recent diagnosis of Parkinson's Disease and anxiety.  PMHx includes, but not limited to: Parkinson's Disease, anxiety, hyperlipidemia, restless leg syndrome  Successful outreach call made to Deborah Velazquez.  HIPAA identifiers verified.  Deborah Velazquez is agreeable to speak with me regarding her medications and gives consent for me to also speak with her husband.  Subjective: Spoke with Deborah Velazquez and her husband today regarding anxiety that she reports since her diagnosis of Parkinson's Disease in September of 2018.  At that time she was started on Sinemet and since then she has been complaining of fatigue, anxiety and reports that her husband says she has turned "into a zombie."  Her husband reports that she  "can't walk through a door, doesn't hear anything" and that she "just wonders around throughout the day."  Patient and husband both believe that these changes are due to the Sinemet.   Patient reports that she stopped her Sinemet 1/19 without an improvement in symptoms.  She reports she also stopped her alprazolam prn because she did not want to "get addicted."  Patient states she was started on gabapentin at bedtime for restless leg syndrome and that has helped.  She reports occasional, burning sensations of her bottom, right foot that make her breakout into a sweat.    Current Medications: Medications Reviewed Today    Reviewed by Dionne Milo, [facility] (Pharmacist) on 02/04/18 at 0955  Med List Status: <None>  Medication Order Taking? Sig Documenting Provider Last Dose Status Informant        Discontinued 02/04/18 0951 (Patient Preference)   atorvastatin (LIPITOR) 10 MG tablet [id number] Yes Take 10 mg by mouth at bedtime. [provider]   Active Self        Discontinued 02/04/18 0951 (Change in therapy)         Discontinued 02/04/18 0951 (Discontinued by provider)         Discontinued 02/04/18 0951 (Patient Preference)         Discontinued 02/04/18 5784 (Patient Preference)   gabapentin (NEURONTIN) 300 MG capsule [id number] Yes Take 300 mg by mouth at bedtime. [provider]  Active Self        Discontinued 02/04/18 [phone] (Error)         Discontinued 02/04/18 0953 (Patient Preference)         Discontinued 02/04/18 0953 (Change in therapy)   sertraline (ZOLOFT) 100 MG tablet [id number] Yes Take 150 mg by mouth daily. [provider]  Active Self         Functional Status: No flowsheet data found.  Fall/Depression Screening: Fall Risk  12/16/2017  Falls in the past year? No   PHQ 2/9 Scores 02/02/2018  PHQ - 2 Score 6  PHQ- 9 Score 22    Assessment:  Drugs sorted by system:  Neurologic/Psychologic: gabapentin, sertraline  Cardiovascular: atorvastatin  Pulmonary/Allergy:  Gastrointestinal:  Endocrine:  Renal:  Topical:  Pain:  Vitamins/Minerals:  Infectious Diseases:  Miscellaneous:   Duplications in therapy: none Gaps in therapy: Parkinson's currently not treated Medications to avoid in the elderly: none Drug interactions: none Other issues noted:  Medication Management: Per review of neurology note on 2/15, Dr. Carles Collet feels patient is underdosed as patient is not on daytime Sinemet.  She recommended restarting  IR Sinemet in the day and increase up  to 1 tablet po TID with the Sinemet 50mg /200mg  at bedtime.  [facility] pharmacist counseled patient and her husband on the signs/symptoms of Parkinson's Disease.  She voiced agreement to having many of the symptoms discussed.  Despite being off Sinemet, she is still having symptoms and anxiety. [facility] pharmacist reinforced that her symptoms are likely disease realated, since they persist off Sinemet and counseled her on importance of taking  Parkinson's medication to help manage disease symptoms.  Patent verbalized increased anxiety due to inability to complete daily task such as "putting her arm through her shirt sleeve."  Reinforced that controlling Parkinson's symptoms may decrease daily frustration and anxiety of simple tasks.     Successful outreach call placed to PCP, Dr.Babaoff's office to inform him of my conversation with patient about her increased anxiety.  They reported adding buspirone for anxiety, but it was without benefit and discontinued.  Then they increased her sertraline dose on 01/09/18.  After these interventions, Dr. Lucrezia Starch suggested a trial off Sinemet for one week to see if patient had decreased symptoms.  Patient preference not to resume Parkinson's mediation after this trial.  Successful followup outreach call placed to Mr. Strauser.  Discussed with patient the conversation with PCP office. Encourgaged patient to consider resuming Parkinson's medications.  Based on last neurology note, Dr. Carles Collet office's had left plan for Deborah Velazquez to contact their office if she wished to resume Sinemet.  Patient verbalized that she wanted to resume medication and would call her neurologist.   Plan: Follow up with Deborah Velazquez on Friday to see if she contacted neurology and if she has further questions.  [name], PharmD Clinical Pharmacist [place] [phone]

## 2018-02-05 ENCOUNTER — Other Ambulatory Visit: Payer: Self-pay

## 2018-02-05 MED ORDER — CARBIDOPA-LEVODOPA 25-100 MG PO TABS: 1.0000 | ORAL_TABLET | Freq: Three times a day (TID) | ORAL | 2 refills | Status: DC

## 2018-02-05 NOTE — Telephone Encounter (Signed)
Per message she wanted to go back on medication. Would that be okay?

## 2018-02-05 NOTE — Telephone Encounter (Signed)
ok 

## 2018-02-05 NOTE — Addendum Note (Signed)
Addended byMargarette Asal L on: 02/05/2018 11:13 AM   Modules accepted: Orders

## 2018-02-05 NOTE — Telephone Encounter (Signed)
She didn't stop it on her own.  Per last notes, she wanted to go off of it and I approved.  Counselor may be good idea.

## 2018-02-05 NOTE — Telephone Encounter (Signed)
Spoke with patient. She does want to start daytime Levodopa. She has seen PCP about anxiety. She is now taking Zoloft 150 mg daily and Xanax 0.5 mg - 1/2 BID.   Patient aware sending Levodopa RX to her pharmacy, but to start with directions I am sending through [place]. She expressed understanding.

## 2018-02-05 NOTE — Patient Outreach (Signed)
[facility]) Care Management  02/05/2018  Deborah Velazquez [birth year] [id number]  Incoming call received from [name].  HIPAA identifiers verified.  [name] reports that she "remembered why she is so anxious."  Reports that her anxiety level is increased because after her diagnosis she did not have a follow up scheduled as soon as she would have desired.  Reports she felt uneasy with new diagnosis and thought she should have been followed sooner. [facility] Pharmacist asked [name] if she had contacted Dr. Doristine Devoid office to inquire about restarting her Sinemet as she planned yesterday.  She said she thought [place] at neurology clinic was going to reach out to her for an appointment.  Encouraged her to call office this morning and follow up with [name].  Plan: Follow up with [name] tomorrow to see if she is resuming the Sinemet and understand the directions.  [name], PharmD Clinical Pharmacist [place] [phone]

## 2018-02-06 ENCOUNTER — Other Ambulatory Visit: Payer: Self-pay

## 2018-02-06 NOTE — Patient Outreach (Signed)
[place] [facility]) Care Management  02/06/2018  DANALEE FLATH 02-11-1946 [id number]   Succesful outreach call placed to Ms. Cobbs regarding medication management for Parkinson's Disease and anxiety.  HIPAA identifiers verified.  Subjective:   Patient reports that her neurologist, Dr. Carles Collet. called in a new prescription for Sinemet IR and she has picked it up and already taken her morning dose.  Patient was able to articulate dosing escalation plan below.  Sinemet IR 25mg /100mg  1/2 tablet TID at least 30 minutes before meals  X 1 week. 1/2 tablet in am, 1/2 tablet in afternoon and 1 tablet in evening at least 30 minutes before meals x 1 week 1/2 tablet in am, 1 tablet in afternoon and evening at least 30 minutes before meals x 1 week 1 tablet TID at least 30 minutes before meals  Pt reports feeling much less anxious today and is optimistic about resuming Sinemet.  Counseled patient that it may take awhile to achieve full effect of the drug, as the medication is being resumed slowly to avoid side effects.  I reminded patient that as her Parkinson's symptoms improve, she will hopefully be less anxious as she will be able to function in her normal daily tasks.  Patient reports that her PCP, Dr. Baldemar Lenis has called in a new anti-anxiety medication to her pharmacy, but she did not know the medications name.  [facility] Pharmacist contacted [facility] and prescription for "Hydroxyzine 50 mg 1 three times daily as needed for itching" is ready.    I reviewed with patientt the sedative and anticholinergic side effects of hydroxyzine and made sure that she understood that it was for anxiety even though the prescription label states itching.  Counseled her not to take the hydroxyzine if she takes alprazolam.      Ms. Litsey reports that her vision seems to be worsening despite glasses.  Encouraged patient to schedule appointment with eye doctor.  She said they are closed on Friday, but that she will call on  Monday.    Plan: 1.  Follow up with Ms. Sutphen next Friday 3/22 to see if Parkinson's symptoms and anxiety improving and if she has further questions. 2.  Will mail her [place]i patient education on Parkinson's Disease and Medication for Parkinson's Disease.  [name], PharmD Clinical Pharmacist [place] [phone]

## 2018-02-09 ENCOUNTER — Other Ambulatory Visit: Payer: Self-pay

## 2018-02-09 NOTE — Patient Outreach (Addendum)
[facility]) Care Management  02/09/2018  RAVEN FURNAS 14-Mar-1946 [id number]  Incoming call received from Ms. Witczak today regarding medication management for Sinemet and Hydroxyxine.  HIPAA identfiiers verified.   Subjective: Ms. Cardamone  requested information about when to take her Sinemet.  She had been taking at breakfast, lunch and bedtime.  I counseled her to space her dose evenly thoroughout her waking hours.  She has been setting her alarm at 0600 to wake up and take her Sinemet and then going back to sleep.  Patient was told that it is fine to move her sinemet morning dose to 0800 when she would normally awake and then to take it in the afternoon around 1500-1600 and then at bedtime 2200-2300.  She reports that she feels much less anxious in the afternoon and evening and reports she doesn't want to go to sleep at night because she feels so good.   However, she reports being very anxious in the mornings.   Patient reports taking hydroxyzine midmorning about once per day.   Ms. Posch states she has been enjoying more outside activities to help with her anxiety.   Assessment: Counseled patient to take her hyrdroxyzine earlier in the morning before the anxiety starts, since that is her most anxious time of the day.   She can also take it up to three times daily if she needs it.    Plan: Follow up with [name] on Friday about Sinemet and her anxiety.    [name], PharmD Clinical Pharmacist [place] [phone]

## 2018-02-13 ENCOUNTER — Other Ambulatory Visit: Payer: Self-pay

## 2018-02-13 ENCOUNTER — Ambulatory Visit: Payer: Self-pay

## 2018-02-13 NOTE — Patient Outreach (Signed)
[place] [facility]) Care Management  02/13/2018  Deborah Velazquez [birth year] [id number]   Successful outreach call placed to Ms. Hardaway.  HIPAA identifiers verified.  72 year old female referred to [place] [facility] by nurse referral line.  [place] services requested for medication management for recent diagnosis of Parkinson's Disease and anxiety.  PMHx includes, but not limited to: Parkinson's Disease, anxiety, hyperlipidemia, restless leg syndrome.    Following patient for medication management and adherence as she reinitiates her Parkinson's medication.  Subjective: Ms. Imber reports feeling much better "most of the day."  She states that the mornings are when she feels the worst and describes stiffness and more anxiety.  She says her afternoons and evenings are much improved.  She reports taking her Sinemet as prescribed (esculating her bedtime dose to 1 whole tablet) and using her hydroxyzine for anxiety 2-3 times daily.  She says she is a more normal routine and seems much happier.  Plan: Call patient next week to follow up with how she is doing and see if she has any questions or concerns.  [name], PharmD Clinical Pharmacist [place] [phone]

## 2018-02-16 ENCOUNTER — Other Ambulatory Visit: Payer: Self-pay

## 2018-02-16 NOTE — Patient Outreach (Signed)
[place] [facility]) Care Management  02/16/2018  BRYNLEIGH SEQUEIRA Mar 25, 1946 [id number]   81year oldfemalereferred to [place] Management by nurse referral line. [place] services requested for medication management for recent diagnosis of Parkinson's Disease and anxiety.PMHx includes, but not limited DI:[id number]'K Disease, anxiety, hyperlipidemia, restless leg syndrome.    Incoming call received by Ms. Skelley this morning.  HIPAA identifiers verified.  Subjective: Ms. Haft reports that she feels more anxious since increasing her Sinemet bedtime dose to a whole tablet on 3/21.  She questioned whether her anxiety was due to her increased dose of Sinemet ?  I counseled her that it may take a little while when drug dose adjustments are made for her body to get used to the new dose and that she should continue with her regimen prescribed by her neurologist.  Patient reports that she is taking hydroxyzine for anxiety, but that she has not taken it this morning.   I counseled her to take the hydroxyzine when she wakes up, since morning is the time of day when she reports her anxiety is at the highest level.    Plan: Follow up with patient later this week to see if anxiety is improved.    [name], PharmD Clinical Pharmacist [place] [phone]

## 2018-02-18 ENCOUNTER — Other Ambulatory Visit: Payer: Self-pay

## 2018-02-18 NOTE — Patient Outreach (Signed)
[place] [facility]) Care Management  02/18/2018  Deborah Velazquez May 30, 1946 [id number]   34year oldfemalereferred to [place] Management by nurse referral line. [place] services requested for medication management for recent diagnosis of Parkinson's Disease and anxiety.PMHx includes, but not limited WU:[id number] Disease, anxiety, hyperlipidemia, restless leg syndrome.   Successful outreach call placed to Mrs. Deborah Velazquez.  HIPAA identifiers verified.  Subjective: Mrs. Deborah Velazquez reports that she is still nervous, but "has no idea about what."  She states that she just took a hyroxyzine for anxiety.  She questioned if she should continue to take her medication for resttess leg if she is taking her Parkinson's Medication.   She states that her restless leg is much improved and that she sleeps well at night.  Patient reports that she would like to lay in bed all day, but that she makes herself get up. She verbalizes that she has always liked to lay in bed and that is not something new.  She states that she has not had to call her PCP with any issues lately.  She also states that she feels her anxiety started when the neurologist did not want to see her again for three months after her initial cognitive tests.     Assessment: Patient seems much improved to me from when I first reached out to her.  I reiterated that she should continue her dose escalation of the Sinemet.  I told her to continue with her gabapentin and sinemet as prescribed.   I verbalized that it was a good thing the neurologist  thought she didn't need to be seen for 3 months and that if she were really sick, that the doctor would have seen her sooner.   I also  encouraged her to continue to do all the things that she enjoys and try to stay active as that will decrease her anxiety.   Mrs. Deborah Velazquez has her first follow up appointment with neurology, Dr. Carles Collet next month.    I told patient that I was closing her [place] case  because she is doing so well, but that she can call me in the future if needed.   Plan:  [place] will close patient's case as all her medication management needs have been met at this time.  I am happy to assist in the future if needed.  I will alert the [facility] CMA of case closure.  [name], PharmD Clinical Pharmacist [place] [phone]

## 2018-02-27 DIAGNOSIS — F418 Other specified anxiety disorders: Secondary | ICD-10-CM | POA: Diagnosis not present

## 2018-02-27 DIAGNOSIS — G20 Parkinson's disease: Secondary | ICD-10-CM | POA: Diagnosis not present

## 2018-02-27 DIAGNOSIS — R21 Rash and other nonspecific skin eruption: Secondary | ICD-10-CM | POA: Diagnosis not present

## 2018-03-06 ENCOUNTER — Ambulatory Visit: Payer: Self-pay

## 2018-03-06 ENCOUNTER — Other Ambulatory Visit: Payer: Self-pay

## 2018-03-06 NOTE — Patient Outreach (Signed)
[facility]) Care Management  03/06/2018   Deborah Velazquez 1946/04/19 [id number]  Initial assessment Subjective: Telephone call to patient regarding for follow up. HIPAA verified with patient. Patient states the [facility] pharmacist has been working with her and her doctor regarding her medications. Patient states she had a period of time within the past few weeks that all of her medications were discontinued. Patient states she still had anxiety during this time. Patient states she had a follow up appointment with her primary MD on 4/5 19. Patient states she is scheduled to see her neurologist, Dr. Carles Collet on 03/11/18.  Patient reports her primary MD wants to follow up with her after she has seen Dr. Carles Collet.   Patient reports symptoms of anxiety, fatigue, tightness in thighs and calf.  RNCM reviewed EMMI education material on parkinson's with patient.  Patient states her last neurology appointment was December 16, 2017.  Patient concerned that her appointments are to far apart.  RNCM encouraged patient to call the neurology office prior to appointment if she is having concerns/ symptoms. Patient verbalized understanding.  Patient verbally agreed to next telephone outreach with [facility].   Objective: see assessment  Current Medications:  Current Outpatient Medications  Medication Sig Dispense Refill  . atorvastatin (LIPITOR) 10 MG tablet Take 10 mg by mouth at bedtime.    . carbidopa-levodopa (SINEMET IR) 25-100 MG tablet Take 1 tablet by mouth 3 (three) times daily. 90 tablet 2  . gabapentin (NEURONTIN) 300 MG capsule Take 300 mg by mouth at bedtime.    . sertraline (ZOLOFT) 100 MG tablet Take 150 mg by mouth daily.     No current facility-administered medications for this visit.     Functional Status:  In your present state of health, do you have any difficulty performing the following activities: 03/06/2018  Hearing? N  Vision? Y  Comment left eye cataract  Difficulty concentrating or  making decisions? Y  Walking or climbing stairs? Y  Dressing or bathing? N  Doing errands, shopping? Y  Preparing Food and eating ? N  Using the Toilet? N  In the past six months, have you accidently leaked urine? N  Do you have problems with loss of bowel control? N  Managing your Medications? N  Managing your Finances? Y  Housekeeping or managing your Housekeeping? Y  Some recent data might be hidden    Fall/Depression Screening: Fall Risk  03/06/2018 12/16/2017  Falls in the past year? Yes No  Number falls in past yr: 1 -  Injury with Fall? No -  Risk for fall due to : History of fall(s) -  Follow up Falls prevention discussed -   PHQ 2/9 Scores 03/06/2018 02/02/2018  PHQ - 2 Score 3 6  PHQ- 9 Score 15 22   [facility] CM Care Plan Problem One     Most Recent Value  Care Plan Problem One  knowledge deficit related to new Parkinson's diagnosis  Role Documenting the Problem One  Care Management Telephonic Coordinator  Care Plan for Problem One  Active  [facility] Long Term Goal   Patient will verbalize 3 ways to cope with Parkinson's disease symptoms within 31 days  [facility] Long Term Goal Start Date  02/02/18  [facility] Long Term Goal Met Date  -- [ongoing goal ]  Interventions for Problem One Long Term Goal  RNCM reviewed EMMI education material with patient on Parkinson's  [facility] CM Short Term Goal #1   patient will verbalize keeping follow up appointment  with neurologist within 2 weeks   [facility] CM Short Term Goal #1 Start Date  03/06/18  Interventions for Short Term Goal #1  RNCM advised patient to keep follow up appointment with neurologist.     [facility] CM Care Plan Problem Two     Most Recent Value  Care Plan Problem Two  Anxiety symptoms   Role Documenting the Problem Two  Care Management Telephonic Coordinator  Care Plan for Problem Two  Active  [facility] CM Short Term Goal #1   Patient will verbalize decrease in anxiety symptoms within 30 days.   [facility] CM Short Term Goal #1 Start Date  02/02/18  [facility] CM Short  Term Goal #1 Met Date   -- [ongoing goal ]  Interventions for Short Term Goal #2   RNCM advised patient to keep follow up visit with neurologist and ask about symptom of anxiety      Assessment: ongoing follow up for Parkinson's disease management and support.   Plan: RNCM will follow up with patient within 3 weeks.  RNCM will send [facility] assessment to primary MD.   Quinn Plowman RN,BSN,CCM [facility] Telephonic  [phone]

## 2018-03-10 NOTE — Progress Notes (Signed)
Deborah Velazquez was seen today in the movement disorders clinic for neurologic consultation at the request of [name], MD.  The consultation is for the evaluation of tremor and to r/o PD.  Pt previously seen Dr. [name].  The records that were made available to me were reviewed.  This patient is accompanied in the office by her spouse who supplements the history.   Patient first saw Dr. [name] on August 19, 2017.  She was diagnosed with Parkinson's disease at that visit and started on carbidopa/levodopa 25/100, 1 tablet 3 times per day.  She followed up in October, 2018 and stated that the medication definitely helped, but complained of fatigue.  She denies today that it was of benefit.  He recommended that she change to carbidopa/levodopa 25/100 CR, 1 tablet twice per day (8am/8pm). Her husband states that this also "turned her into a zombie."   He also recommended donepezil.  She states that she was not prescribed that and she doesn't know what that was for.  On pramipexole 1 mg for RLS x 10-20 years per patient.  03/11/18 update: Patient is seen in follow-up for Parkinson's disease.  Records have been reviewed since our last visit.  We decided to hold her daytime levodopa after last visit as the patient thought it was causing memory change (I was not convinced), although she still takes carbidopa/levodopa 50/200 at bedtime for restless leg.  She was weaned off pramipexole for restless leg. she saw Dr. [name] for neuropscyhometric testing on December 30, 2017 and subsequently had a feedback session with her where results and recommendations were given to her.  These are detailed within the chart.  In brief, this demonstrated mild cognitive impairment, but no evidence of dementia.  She called here on January 09, 2018 stating that she was having anxiety, diarrhea, cramping and "felt like a zombie."  We explained to her that she was likely very underdosed as she had refused medication.  Daytime  levodopa was offered at proper dosages, but she stated that she wanted to address anxiety with her primary care physician first.  She does report that she is back on levodopa now and started that on 3/15 but didn't get up to the dosage 25/100 IR tid until 5/11 (8am/4pm/11pm).   She did see her PCP and was given as needed Xanax and increased her Zoloft to 150 mg daily.  Primary care records are reviewed.  Her primary care physician did tell her that he really did not want her to use scheduled Xanax and would prefer sertraline and buspar.  She saw her primary care physician on January 23, 2018 and it was recommended that she discontinue her levodopa as she told him she felt much worse on the medication.  She called her primary care physician on January 29, 2018 stating that she was doing worse and she was placed on gabapentin for restless leg.  She called her primary care physician back on February 03, 2018 due to anxiety and psychiatry was recommended.  She didn't go see psychiatry.  Anxiety continues to be a big issue.  She states that she is on hydroxizine and PCP took her off of xanax.  Husband finds that when she is busy she does much better.  She has poor motivation to be busy though.  She walked some yesterday (20 min).  One fall when carrying groceries but didn't get hurt.     ALLERGIES:   Allergies  Allergen Reactions  . Pseudoephedrine Hcl  Other (See Comments)    Other Reaction: nervous  . Sudafed [Pseudoephedrine]     CURRENT MEDICATIONS:  Outpatient Encounter Medications as of 03/11/2018  Medication Sig  . atorvastatin (LIPITOR) 10 MG tablet Take 10 mg by mouth at bedtime.  . carbidopa-levodopa (SINEMET IR) 25-100 MG tablet Take 1 tablet by mouth 3 (three) times daily.  Marland Kitchen co-enzyme Q-10 30 MG capsule Take 100 mg by mouth daily.  Marland Kitchen gabapentin (NEURONTIN) 300 MG capsule Take 300 mg by mouth at bedtime.  . hydrOXYzine (ATARAX/VISTARIL) 50 MG tablet Take 50 mg by mouth 4 (four) times daily.  Marland Kitchen  ibuprofen (ADVIL,MOTRIN) 200 MG tablet Take 200 mg by mouth every 6 (six) hours as needed.  . Omega-3 Fatty Acids (FISH OIL) 1000 MG CAPS Take by mouth.  . psyllium (METAMUCIL) 58.6 % powder Take 1 packet by mouth 3 (three) times daily.  . sertraline (ZOLOFT) 100 MG tablet Take 150 mg by mouth daily.  . vitamin B-12 (CYANOCOBALAMIN) 250 MCG tablet Take 250 mcg by mouth daily.   No facility-administered encounter medications on file as of 03/11/2018.     PAST MEDICAL HISTORY:   Past Medical History:  Diagnosis Date  . Anxiety   . Hyperlipemia   . Irritable bowel syndrome   . Parkinson disease ([place])   . Restless leg syndrome   . RSD (reflex sympathetic dystrophy)    pt denies    PAST SURGICAL HISTORY:   Past Surgical History:  Procedure Laterality Date  . APPENDECTOMY    . BREAST BIOPSY Left 08/16/2013   neg  . CATARACT EXTRACTION Left   . COLONOSCOPY    . COLONOSCOPY WITH PROPOFOL N/A 06/20/2015   Procedure: COLONOSCOPY WITH PROPOFOL;  Surgeon: Lollie Sails, MD;  Location[facility] ENDOSCOPY;  Service: Endoscopy;  Laterality: N/A;  . FRACTURE SURGERY     wrist - right    SOCIAL HISTORY:   Social History   Socioeconomic History  . Marital status: Married    Spouse name: Not on file  . Number of children: Not on file  . Years of education: Not on file  . Highest education level: Not on file  Occupational History  . Occupation: retired    Comment: Geographical information systems officer  . Financial resource strain: Not on file  . Food insecurity:    Worry: Not on file    Inability: Not on file  . Transportation needs:    Medical: Not on file    Non-medical: Not on file  Tobacco Use  . Smoking status: Never Smoker  . Smokeless tobacco: Never Used  Substance and Sexual Activity  . Alcohol use: Yes    Comment: 1-2 x a week  . Drug use: No  . Sexual activity: Not on file  Lifestyle  . Physical activity:    Days per week: Not on file    Minutes per session: Not on file  . Stress:  Not on file  Relationships  . Social connections:    Talks on phone: Not on file    Gets together: Not on file    Attends religious service: Not on file    Active member of club or organization: Not on file    Attends meetings of clubs or organizations: Not on file    Relationship status: Not on file  . Intimate partner violence:    Fear of current or ex partner: Not on file    Emotionally abused: Not on file    Physically abused:  Not on file    Forced sexual activity: Not on file  Other Topics Concern  . Not on file  Social History Narrative  . Not on file    FAMILY HISTORY:   Family Status  Relation Name Status  . Mother  Deceased  . Father  Deceased  . Mat Aunt  (Not Specified)    ROS:  R foot burning.  A complete 10 system review of systems was obtained and was unremarkable apart from what is mentioned above.  PHYSICAL EXAMINATION:    VITALS:   Vitals:   03/11/18 0922  BP: 128/72  Pulse: 86  SpO2: 94%  Weight: 145 lb (65.8 kg)  Height: 5\' 6"  (1.676 m)    GEN:  The patient appears stated age and is in NAD. HEENT:  Normocephalic, atraumatic.  The mucous membranes are moist. The superficial temporal arteries are without ropiness or tenderness. CV:  RRR Lungs:  CTAB Neck/HEME:  There are no carotid bruits bilaterally.  Neurological examination:  Orientation: alert and oriented x 3 Montreal Cognitive Assessment  12/16/2017  Visuospatial/ Executive (0/5) 3  Naming (0/3) 2  Attention: Read list of digits (0/2) 2  Attention: Read list of letters (0/1) 1  Attention: Serial 7 subtraction starting at 100 (0/3) 1  Language: Repeat phrase (0/2) 2  Language : Fluency (0/1) 1  Abstraction (0/2) 1  Delayed Recall (0/5) 4  Orientation (0/6) 6  Total 23  Adjusted Score (based on education) 23   Cranial nerves: There is good facial symmetry. There is facial hypomimia. Extraocular muscles are intact. The visual fields are full to confrontational testing. The speech is  fluent and clear.   She is hypophonic.  Soft palate rises symmetrically and there is no tongue deviation. Hearing is intact to conversational tone. Sensation: Sensation is intact to light touch throughout Motor: Strength is 5/5 in the bilateral upper and lower extremities.   Shoulder shrug is equal and symmetric.  There is no pronator drift. Deep tendon reflexes: Deep tendon reflexes are 3/4 at the bilateral biceps, triceps, brachioradialis, patella and achilles. Plantar responses are downgoing bilaterally.  Movement examination: Tone: There is normal tone in the UE and mild to mod increased in the bilateral lower extremities Abnormal movements: none even with distraction Coordination:  There is mild decremation, with any form of RAMS, including alternating supination and pronation of the forearm, hand opening and closing, finger taps, heel taps and toe taps. Gait and Station: When asked to get OOC without hands, she cannot do it (but effort is questionable).  She does arise by pushing off of the chair.  She has decreased arm swing.     ASSESSMENT/PLAN:  1.  Idiopathic Parkinson's disease.    -We discussed the diagnosis as well as pathophysiology of the disease.  We discussed treatment options as well as prognostic indicators.  Patient education was provided.  -We discussed that it used to be thought that levodopa would increase risk of melanoma but now it is believed that Parkinsons itself likely increases risk of melanoma. she is to get regular skin checks.  -As previous, I was not and am not convinced that carbidopa/levodopa is causing memory change and, in fact, neurocognitive testing demonstrated no evidence of dementia. She was also off of it for over a month and she/husband both admit no memory change improvement when off.     -increase carbidopa/levodopa 25/100, 2/1/1 and move dosages closer together  -start Carbidopa/levodopa 50/200 at bed  -talked about exercise  -  talked about regular  schedule    2.  Memory change  -Neurocognitive testing in February, 2019 demonstrated no evidence of dementia, but only mild cognitive impairment.  3.  Anxiety  -This is very significant and getting in the way of treating her Parkinson's disease.  I agree with her primary care physician that psychiatry is recommended.  She and I talked about this at length.  She reports that she will make appt.    4.  Much greater than 50% of this visit was spent in counseling and coordinating care.  Total face to face time:  40 min.  Follow up is anticipated in the next 4 months, sooner should new neurologic issues arise.   Cc:  [name], MD

## 2018-03-11 ENCOUNTER — Ambulatory Visit (INDEPENDENT_AMBULATORY_CARE_PROVIDER_SITE_OTHER): Payer: PPO | Admitting: Neurology

## 2018-03-11 ENCOUNTER — Encounter: Payer: Self-pay | Admitting: Neurology

## 2018-03-11 VITALS — BP 128/72 | HR 86 | Ht 66.0 in | Wt 145.0 lb

## 2018-03-11 DIAGNOSIS — G20 Parkinson's disease: Secondary | ICD-10-CM | POA: Diagnosis not present

## 2018-03-11 DIAGNOSIS — F411 Generalized anxiety disorder: Secondary | ICD-10-CM

## 2018-03-11 NOTE — Patient Instructions (Signed)
1.  Take carbidopa/levodopa 25/100 as follows: 2 at 8am/1 at noon/1 at 4pm 2.  Take carbidopa/levodopa 50/200 at bedtime 3.  Make an appointment with psychiatry about anxiety 4.  Make a regular daily schedule and follow it   [facility] Parkinson's [facility] Movement [facility]  The [place] team know that living well with a movement disorder extends far beyond our clinic walls. We are together with you. Our team is passionate about providing resources to you and your loved ones who are living with Parkinson's disease and movement disorders. Participate in these programs and join our community. These resources are free or low cost!   [facility] is adding:   Innovative educational programs for patients and caregivers.   Support groups for patients and caregivers living with Parkinson's disease.   Parkinson's specific exercise programs.   Custom tailored therapeutic programs that will benefit patient's living with Parkinson's disease.   We are in this together. You can help and contribute to grow these programs and resources in our community. 100% of the funds donated to the [facility] stays right here in our community to support patients and their caregivers.  To make a tax deductible contribution:  -ask for a [facility] Parkinson's envelope in the office today.  - call the Office of Institutional Advancement at [phone].

## 2018-03-12 DIAGNOSIS — D2262 Melanocytic nevi of left upper limb, including shoulder: Secondary | ICD-10-CM | POA: Diagnosis not present

## 2018-03-12 DIAGNOSIS — D225 Melanocytic nevi of trunk: Secondary | ICD-10-CM | POA: Diagnosis not present

## 2018-03-12 DIAGNOSIS — D2261 Melanocytic nevi of right upper limb, including shoulder: Secondary | ICD-10-CM | POA: Diagnosis not present

## 2018-03-12 DIAGNOSIS — D2271 Melanocytic nevi of right lower limb, including hip: Secondary | ICD-10-CM | POA: Diagnosis not present

## 2018-03-12 DIAGNOSIS — L821 Other seborrheic keratosis: Secondary | ICD-10-CM | POA: Diagnosis not present

## 2018-03-12 DIAGNOSIS — D2272 Melanocytic nevi of left lower limb, including hip: Secondary | ICD-10-CM | POA: Diagnosis not present

## 2018-03-12 DIAGNOSIS — L853 Xerosis cutis: Secondary | ICD-10-CM | POA: Diagnosis not present

## 2018-03-23 ENCOUNTER — Other Ambulatory Visit: Payer: Self-pay

## 2018-03-23 NOTE — Patient Outreach (Addendum)
[facility]) Care Management  03/23/2018  Deborah Velazquez 09-08-1946 [id number]   28year oldfemalereferred to [facility] by nurse referral line. [facility] [place] services requested for medication management for recent diagnosis of Parkinson's Disease and anxiety.PMHx includes, but not limited IO:[id number] Disease, anxiety, hyperlipidemia, restless leg syndrome.   Incoming call received from Deborah Velazquez today.  HIPAA identifiers verified.  Subjective: Deborah Velazquez states that she is still experiencing a high level of anxiety.  She states that she wants to lay around all day and sleep and that her house is a  "wreck" because she has no desire to clean anymore.  She reports that she saw Dr. Carles Collet last week and she increased her Sinemet.  Patient states that she has tried to exercise more to see if that decreases her anxiety.  When questioned, the patient reports that she has always had some degree of anxiousness, but it is now worse than ever before.  Deborah Velazquez states that Dr. Carles Collet and her PCP, Dr. Baldemar Lenis recommend that she see a psychiatrist for her anxiety.  Assessment: I encouraged Deborah Velazquez to continue to exercise and do things she enjoys to keep busy.  I advised her to set little goals that she can complete daily to give herself a sense of accomplishment.  I counseled her to call her PCP, for a recommendation of a psychiatrist in her area.  She verbalized that she would call Dr. Baldemar Lenis to get his recommendation.  [name], PharmD Clinical Pharmacist [place] [phone]

## 2018-03-24 ENCOUNTER — Ambulatory Visit: Payer: Self-pay

## 2018-03-24 ENCOUNTER — Other Ambulatory Visit: Payer: Self-pay | Admitting: Neurology

## 2018-03-24 ENCOUNTER — Telehealth: Payer: Self-pay | Admitting: Neurology

## 2018-03-24 NOTE — Telephone Encounter (Signed)
Received request from pharmacy and already sent it.

## 2018-03-24 NOTE — Telephone Encounter (Signed)
*  STAT* If patient is at the pharmacy, call can be transferred to refill team.  1.     Which medications need to be refilled? (please list name of each medication and dose if know) Caridopa levopoda 25-100 mg   2.     Which pharmacy/location (including street and city if local pharmacy) is medication to be sent to? [address]   3.     Do they need a 30 or 90 day supply?30 day   4 pills a day per patient

## 2018-03-25 ENCOUNTER — Other Ambulatory Visit: Payer: Self-pay

## 2018-03-25 NOTE — Patient Outreach (Signed)
[facility]) Care Management  03/25/2018  KEIRAH KONITZER Feb 18, 1946 [id number]  Telephone assessment:  Referral date: 02/02/18 Insurance: health team advantage Attempt #1  Telephone call to patient regarding assessment follow up.  Unable to reach patient. HIPAA compliant voice message left with call back phone number.   PLAN; RNCM will attempt 2nd telephone call to patient within 4 business days.  RNCM will send patient outreach letter to attempt contact.   Quinn Plowman RN,BSN,CCM [facility] Telephonic  [phone]

## 2018-03-31 ENCOUNTER — Other Ambulatory Visit: Payer: Self-pay

## 2018-03-31 NOTE — Patient Outreach (Signed)
[facility]) Care Management  03/31/2018  Deborah Velazquez 11/16/1946 [id number]   Telephone assessment:  Referral date:02/02/18 Insurance:health team advantage Attempt #2  Telephone call to patient regarding assessment follow up.  Unable to reach patient. HIPAA compliant voice message left with call back phone number.   PLAN; RNCM will attempt 3rd telephone call to patient within 4 business days.    Quinn Plowman RN,BSN,CCM [facility] Telephonic  [phone]

## 2018-04-02 ENCOUNTER — Encounter: Payer: Self-pay | Admitting: Internal Medicine

## 2018-04-02 ENCOUNTER — Telehealth: Payer: Self-pay | Admitting: Internal Medicine

## 2018-04-02 ENCOUNTER — Ambulatory Visit: Payer: PPO | Admitting: Internal Medicine

## 2018-04-02 ENCOUNTER — Ambulatory Visit (INDEPENDENT_AMBULATORY_CARE_PROVIDER_SITE_OTHER): Payer: PPO | Admitting: Internal Medicine

## 2018-04-02 VITALS — BP 108/70 | HR 83 | Temp 97.8°F | Resp 15 | Ht 66.0 in | Wt 144.2 lb

## 2018-04-02 DIAGNOSIS — F419 Anxiety disorder, unspecified: Secondary | ICD-10-CM

## 2018-04-02 DIAGNOSIS — G629 Polyneuropathy, unspecified: Secondary | ICD-10-CM | POA: Diagnosis not present

## 2018-04-02 DIAGNOSIS — E559 Vitamin D deficiency, unspecified: Secondary | ICD-10-CM | POA: Diagnosis not present

## 2018-04-02 DIAGNOSIS — R739 Hyperglycemia, unspecified: Secondary | ICD-10-CM

## 2018-04-02 DIAGNOSIS — R7303 Prediabetes: Secondary | ICD-10-CM

## 2018-04-02 LAB — VITAMIN B12: VITAMIN B 12: 546 pg/mL (ref 211–911)

## 2018-04-02 LAB — HEMOGLOBIN A1C: Hgb A1c MFr Bld: 6.3 % (ref 4.6–6.5)

## 2018-04-02 LAB — VITAMIN D 25 HYDROXY (VIT D DEFICIENCY, FRACTURES): VITD: 30.4 ng/mL (ref 30.00–100.00)

## 2018-04-02 LAB — TSH: TSH: 2.15 u[IU]/mL (ref 0.35–4.50)

## 2018-04-02 MED ORDER — CLONAZEPAM 1 MG PO TABS: ORAL_TABLET | ORAL | 1 refills | Status: DC

## 2018-04-02 NOTE — Telephone Encounter (Signed)
I am aware that she does not take xanax any more,  I simply meant it is like xanax but longer acting,   And I do NOT want her to stop the zoloft.  She does not need to taper off hydroxyzine

## 2018-04-02 NOTE — Telephone Encounter (Signed)
Copied from [facility] [phone]. Topic: Quick Communication - See Telephone Encounter >> Apr 02, 2018  2:21 PM Robina Ade, Helene Kelp D wrote: CRM for notification. See Telephone encounter for: 04/02/18. Patient called and has question about the instructions she was given on her paper if Dr. Derrel Nip or her CMA can call her back today please.

## 2018-04-02 NOTE — Telephone Encounter (Signed)
Please advise 

## 2018-04-02 NOTE — Patient Instructions (Addendum)
I am recommending a trial of Klonipin for your anxiety.  This is in place of the xanax AND THE HYDROXYZINE   start with 1/2 tablet two times daily   If you need to ,  You increase the dose to a full tablet.

## 2018-04-02 NOTE — Progress Notes (Signed)
Subjective:  Patient ID: Deborah Velazquez, female    DOB: 1946-06-28  Age: 72 y.o. MRN: [id number]  CC: The primary encounter diagnosis was Elevated blood sugar. Diagnoses of Neuropathy, Vitamin D deficiency, Prediabetes, and Anxiety were also pertinent to this visit.  HPI [name] presents for transfer /establishment of care  Recently diagnosed with Parkinson's Disease after presenting to PCP in Sept 2018 with a one year history of progressive gait disorder, loss of balance and depth perception, resting tremor, and short term memory loss .  She was diagnosed by Dr [name]  And prescribed Sinemet 25/100 tid.  To summarize her course,  She has changed neurologists to Dr Wells Guiles Tat, and has had increased anxiety since the diagnosis,  Making treatment of PD difficult due to fear of side effects  Of Sinemet (per review of Dr Doristine Devoid and Dr Abbe Amsterdam notes)  Generalized Anxiety:  currenlty uncontrolled since the diagnosis of PD>  Had been taking zoloft for 20 years,  d dose was increased from 100 mg to 150 mg in Feb 2019 and buspirone was added due to overuse of alprazolam (she was using it three times daily ) . She continued to have uncontrolled anxiety and zoloft dose was again increased to 200 mg on  May 3  Alprazolam was weaned off and hydroxyzine was prescribed.  She did not tolerate buspirone er notes.  She has noted  neither side effects nor improvement with the change and describes feeling anxious and shaky on the inside constantly.  Her dose of zoloft was reduced to 150 mg at her last visit with Dr Loney Hering.  . .  Has an appointment on July 20 with Dr. Nicolasa Ducking and is requesting that her appointment be moved p if at all possible.    History Marney has a past medical history of Anxiety, Hyperlipemia, Irritable bowel syndrome, Parkinson disease ([place]), Restless leg syndrome, and RSD (reflex sympathetic dystrophy).   She has a past surgical history that includes Appendectomy; Colonoscopy; Fracture  surgery; Colonoscopy with propofol (N/A, 06/20/2015); Breast biopsy (Left, 08/16/2013); and Cataract extraction (Left).   Her family history includes Alzheimer's disease in her father; Breast cancer (age of onset: 65) in her mother; Cancer in her mother; Diabetes in her mother; Heart disease in her maternal aunt and mother.She reports that she has never smoked. She has never used smokeless tobacco. She reports that she drinks about 4.2 oz of alcohol per week. She reports that she does not use drugs.  Outpatient Medications Prior to Visit  Medication Sig Dispense Refill  . atorvastatin (LIPITOR) 10 MG tablet Take 10 mg by mouth at bedtime.    . carbidopa-levodopa (SINEMET IR) 25-100 MG tablet 2 in the morning, 1 in the afternoon, 1 in the evening 360 tablet 1  . co-enzyme Q-10 30 MG capsule Take 100 mg by mouth daily.    Marland Kitchen gabapentin (NEURONTIN) 300 MG capsule Take 300 mg by mouth at bedtime.    Marland Kitchen ibuprofen (ADVIL,MOTRIN) 200 MG tablet Take 200 mg by mouth every 6 (six) hours as needed.    . Omega-3 Fatty Acids (FISH OIL) 1000 MG CAPS Take by mouth.    . psyllium (METAMUCIL) 58.6 % powder Take 1 packet by mouth 3 (three) times daily.    . sertraline (ZOLOFT) 100 MG tablet Take 150 mg by mouth daily.    . vitamin B-12 (CYANOCOBALAMIN) 250 MCG tablet Take 250 mcg by mouth daily.    . hydrOXYzine (ATARAX/VISTARIL) 50 MG tablet Take  50 mg by mouth 4 (four) times daily.     No facility-administered medications prior to visit.     Review of Systems:  Patient denies headache, fevers, malaise, unintentional weight loss, skin rash, eye pain, sinus congestion and sinus pain, sore throat, dysphagia,  hemoptysis , cough, dyspnea, wheezing, chest pain, palpitations, orthopnea, edema, abdominal pain, nausea, melena, diarrhea, constipation, flank pain, dysuria, hematuria, urinary  Frequency, nocturia, numbness, tingling, seizures,  Focal weakness, Loss of consciousness,  Tremor, insomnia, depression, anxiety,  and suicidal ideation.     Objective:  BP 108/70 (BP Location: Left Arm, Patient Position: Sitting, Cuff Size: Normal)   Pulse 83   Temp 97.8 F (36.6 C) (Oral)   Resp 15   Ht 5\' 6"  (1.676 m)   Wt 144 lb 3.2 oz (65.4 kg)   SpO2 97%   BMI 23.27 kg/m   Physical Exam:   Assessment & Plan:   Problem List Items Addressed This Visit    Prediabetes - Primary    Her  random glucose is not  elevated but her A1c suggests she is at risk for developing diabetes.  I recommend he follow a low glycemic index diet and particpate regularly in an aerobic  exercise activity.  We should check an A1c in 6 months.    Lab Results  Component Value Date   HGBA1C 6.3 04/02/2018         Anxiety    Chronic aggravated by diagnosis of PD.  She was using alprazolam tid which was discouraged by her former PCP .  She did not tolerate buspirone and has had no relief with hydroxyzine.  The feeling s pervasive,  And constant.  Will continue zooft 150 mg daily and add clonazepam starting with 0.5 mg bid. Will try to get her new patient appointment with Dr Nicolasa Ducking moved up from July 20       Other Visit Diagnoses    Neuropathy       Relevant Orders   TSH (Completed)   B12 (Completed)   RPR (Completed)   Vitamin D deficiency       Relevant Orders   VITAMIN D 25 Hydroxy (Vit-D Deficiency, Fractures) (Completed)      I have discontinued Kedra G. Mansouri's hydrOXYzine. I am also having her start on clonazePAM. Additionally, I am having her maintain her sertraline, atorvastatin, gabapentin, Fish Oil, ibuprofen, co-enzyme Q-10, vitamin B-12, psyllium, and carbidopa-levodopa.  Meds ordered this encounter  Medications  . clonazePAM (KLONOPIN) 1 MG tablet    Sig: 1/2 to 1 pill 2 times daily    Dispense:  60 tablet    Refill:  1    Medications Discontinued During This Encounter  Medication Reason  . hydrOXYzine (ATARAX/VISTARIL) 50 MG tablet     Follow-up: No follow-ups on file.   [name],  MD

## 2018-04-02 NOTE — Telephone Encounter (Signed)
LMTCB. Please transfer pt to our office.  

## 2018-04-02 NOTE — Telephone Encounter (Signed)
Spoke with pt and she stated that on her AVS it states that "I am recommending a trial of klonipin for your anxiety. This is in place of the xanax and the hydroxyzine". The pt stated that she does not take xanax and is wondering if you meant the zoloft. Also if you meant the zoloft she wanted to know if she needed to taper off of it or the hydroxyzine?

## 2018-04-02 NOTE — Telephone Encounter (Signed)
The patient was seen in the office today she is wanting to know when she will need to follow up.

## 2018-04-03 ENCOUNTER — Other Ambulatory Visit: Payer: Self-pay

## 2018-04-03 LAB — RPR: RPR Ser Ql: NONREACTIVE

## 2018-04-03 NOTE — Telephone Encounter (Signed)
Spoke with Deborah Velazquez and informed her of Dr. Lupita Dawn message below. The Deborah Velazquez wanted to know if she could go ahead and take the klonopin when I talked to her yesterday. I spoke with Gregary Signs, NP because Dr. Derrel Nip was out of the office for the evening. The Deborah Velazquez stated that she had already taken two of the Hydroxyzine that day so when speaking with Gregary Signs, NP she stated that she would like for the Deborah Velazquez to wait until the next day to start the klonopin and not to take anymore hydroxyzine. The Deborah Velazquez gave a verbal understanding.

## 2018-04-03 NOTE — Telephone Encounter (Signed)
Pt has been scheduled for a one month follow up. Pt is aware of appt date and time.

## 2018-04-03 NOTE — Patient Outreach (Signed)
[facility]) Care Management  04/03/2018  Deborah Velazquez 10/31/1946 [id number]   Telephone assessment:  Referral date:02/02/18 Insurance:health team advantage Attempt #1   Telephone call to patient regarding assessment follow up. HIPAA verified with patient.  Patient states she had a follow up visit with the neurologist who changed her medications. States she is back on the full doses of her medications. Patient states she has reviewed this information with [facility] pharmacist.  Patient states she does not see Dr. Loney Hering anymore. States she is now seeing Dr. Derrel Nip as per primary MD. Patient states she saw Dr. Derrel Nip on yesterday 04/02/18. States Dr. Derrel Nip discontinued her hydralazine and started her on a new medication for anxiety. Reports the medicine starts with a "C".  Patient states she is not in the house to look at the bottle. States she believes the medicine is clonazepam once medicine reviewed with RNCM.  Patient states today was her first day taking the medicine and she feels better. States she has not spoken with [place] since the addition of the new medication from Dr. Derrel Nip.   Patient states she has an upcoming appointment with the psychiatrist and her next follow up appointment with the neurologist is in 3 months.  Patient reports her coping mechanisms with Parkinson's are Exercise boxing 1 day per week, New exercise class for Parkinson's 2 days per week, and patient states she has made her home safer to decrease potential for falls.  Patient verbally agreed to next follow up appointment with [facility]  RNCM advised patient to call doctor for any new concerns/ symptoms. Continue to take medication as prescribed and report to doctor if notices medication causing side effects or not working. Patient verbalized understanding.   PLAN; RNCM will follow up with patient within 2 weeks.   Quinn Plowman RN,BSN,CCM [facility] Telephonic  [phone]

## 2018-04-03 NOTE — Telephone Encounter (Signed)
We will start out with a one month follow up

## 2018-04-03 NOTE — Telephone Encounter (Signed)
How often would you like for Deborah Velazquez to follow up?

## 2018-04-04 ENCOUNTER — Encounter: Payer: Self-pay | Admitting: Internal Medicine

## 2018-04-04 NOTE — Assessment & Plan Note (Addendum)
Her  random glucose is not  elevated but her A1c suggests she is at risk for developing diabetes.  I recommend he follow a low glycemic index diet and particpate regularly in an aerobic  exercise activity.  We should check an A1c in 6 months.    Lab Results  Component Value Date   HGBA1C 6.3 04/02/2018

## 2018-04-04 NOTE — Assessment & Plan Note (Signed)
Chronic aggravated by diagnosis of PD.  She was using alprazolam tid which was discouraged by her former PCP .  She did not tolerate buspirone and has had no relief with hydroxyzine.  The feeling s pervasive,  And constant.  Will continue zooft 150 mg daily and add clonazepam starting with 0.5 mg bid. Will try to get her new patient appointment with Dr Nicolasa Ducking moved up from July 20

## 2018-04-08 ENCOUNTER — Other Ambulatory Visit: Payer: Self-pay

## 2018-04-08 NOTE — Patient Outreach (Signed)
[facility]) Care Management  04/08/2018  Deborah Velazquez Apr 15, 1946 [id number]  17year oldfemalereferred to [place] [facility] by nurse referral line. [place] services requested for medication management for recent diagnosis of Parkinson's Disease and anxiety.PMHx includes, but not limited OI:NOMVEHMCN'O Disease, anxiety, hyperlipidemia, restless leg syndrome.   Incoming call received from Deborah Velazquez.  HIPAA identifiers verified.   Subjective: Deborah Velazquez states that she has changed to a new PCP, Dr. Derrel Nip, that attends church with the her.  She reports that the doctor changed her to clonazepam 1/2 to 1 tablet twice daily and discontinued her alprazolam and hydroxyzine.  Patient reports that she is very anxious today and that she took an extra 1 mg of clonazepam around 1300 today in addition to the 0.5mg  that she had this morning.  When questioned if she knew why she was more anxious, Deborah Velazquez got teary and said it might be because they went to the financial planner recently and discussed issues related to the end of life.  Patient reports that Dr. Carles Collet increased her Parkinson's medication in April. She states that her leg pain has progressed more into her thighs, than just her calves as before.  She states her next appointment with Dr. Carles Collet is in August.     [name] reports that she doesn't have any children, but that she keeps in contact with friends she's known since second grade.  She reports going to exercise and boxing classes and repeatedly states that her husband says exercise won't help because her problem "is between her ears."    Patient states that she has an appointment with Dr. Nicolasa Ducking, a psychiatrist in [place] on 05/13/18.  She reports having to wait several weeks for an appointment.    Deborah Velazquez reports that her husband is often in his shop or mowing because they have over 100 acres of land.  She says that she sleeps well, but does not eat as good as she  should.    Assessment: I encouraged the patient to continue exercising and to try to make daily arrangements with friends or church to stay busy so she won't have as much idle time on her hands.  I also encouraged her to give the clonazepam some more time to be fully effective, because she just started it last week. She did not want me to call Dr. Derrel Nip about our conversation because she states that she is aware. I encouraged Deborah Velazquez to call me if she ever needs someone to listen.    Plan: Route in-basket message to [facility] RN Quinn Plowman, care team member.  Deborah Velazquez, PharmD Clinical Pharmacist [place] [phone]

## 2018-04-13 ENCOUNTER — Encounter: Payer: PPO | Admitting: Psychology

## 2018-04-14 DIAGNOSIS — F411 Generalized anxiety disorder: Secondary | ICD-10-CM | POA: Diagnosis not present

## 2018-04-14 DIAGNOSIS — F4321 Adjustment disorder with depressed mood: Secondary | ICD-10-CM | POA: Diagnosis not present

## 2018-04-16 ENCOUNTER — Other Ambulatory Visit: Payer: Self-pay

## 2018-04-16 ENCOUNTER — Other Ambulatory Visit: Payer: Self-pay | Admitting: Internal Medicine

## 2018-04-16 MED ORDER — ATORVASTATIN CALCIUM 10 MG PO TABS: 10.0000 mg | ORAL_TABLET | Freq: Every day | ORAL | 1 refills | Status: DC

## 2018-04-16 NOTE — Telephone Encounter (Signed)
Copied from [place] [id number]. Topic: Quick Communication - Rx Refill/Question >> Apr 16, 2018 11:23 AM Antonieta Iba C wrote: Medication: atorvastatin (LIPITOR) 10 MG tablet   Has the patient contacted their pharmacy? No   (Agent: If no, request that the patient contact the pharmacy for the refill.)  (Agent: If yes, when and what did the pharmacy advise?)  Preferred Pharmacy (with phone number or street name): [id number] - [place], [place] - [address] [phone] (Phone) [phone] (Fax)      Agent: Please be advised that RX refills may take up to 3 business days. We ask that you follow-up with your pharmacy.

## 2018-04-16 NOTE — Telephone Encounter (Signed)
LOV  04/02/18 Dr. Derrel Nip Provider's name not on medication list

## 2018-04-16 NOTE — Patient Outreach (Signed)
[facility]) Care Management  04/16/2018  Deborah Velazquez 11-Jun-1946 [id number]   Telephone assessment: Referral date:02/02/18 Insurance:health team advantage   Telephone call to patient for assessment follow up. HIPAA verified with patient. Patient states she is now seeing psychiatrist, Dr. Nicolasa Ducking who adjusted her medications. Patient states she is on a new medication now for the anxiety, Venlafaxine.  Patient states she continues to take the zoloft and has discontinued the Klonopine.  Patient states she is  Still having the anxiety but she just started the medication 2 days ago. Patient states her psychiatrist informed her it would take time for the medication to get in her system.  Patient states she has been instructed to call her psychiatrist in 1 week and then follow up with her in the office on 04/27/18.   Patient denies any additional concerns at this time. RNCM advised patient to take her medication as prescribe and call her doctor if she notices in new side effects or concerns regarding the medicine. Patient verbalized understanding.  RNCM informed patient she will plan to follow up with her after her April 27, 2018 appointment.   PLAN: RNCM will follow up with patient in the month of June 2019.  Quinn Plowman RN,BSN,CCM [facility] Telephonic  [phone]

## 2018-04-16 NOTE — Telephone Encounter (Signed)
Pravastatin  Never filled in our office. Pt saw you for first time on 04/02/2018. Is it okay to refill?

## 2018-04-17 ENCOUNTER — Telehealth: Payer: Self-pay | Admitting: Internal Medicine

## 2018-04-17 NOTE — Telephone Encounter (Signed)
Pt requesting Zoloft refill. Previously filled by historical provider. Last OV: 04/02/18 Dr. Derrel Nip Pharmacy:  [place]

## 2018-04-17 NOTE — Telephone Encounter (Signed)
Copied from [place] [id number]. Topic: Quick Communication - Rx Refill/Question >> Apr 17, 2018  5:31 PM Tye [place] wrote: Medication: sertraline (ZOLOFT) 100 MG tablet [[id number]]   Has the patient contacted their pharmacy? Yes.   (Agent: If no, request that the patient contact the pharmacy for the refill.) (Agent: If yes, when and what did the pharmacy advise?)  [facility]  Agent: Please be advised that RX refills may take up to 3 business days. We ask that you follow-up with your pharmacy.

## 2018-04-18 ENCOUNTER — Other Ambulatory Visit: Payer: Self-pay | Admitting: Neurology

## 2018-04-21 ENCOUNTER — Other Ambulatory Visit: Payer: Self-pay

## 2018-04-21 ENCOUNTER — Other Ambulatory Visit: Payer: Self-pay | Admitting: Neurology

## 2018-04-21 MED ORDER — SERTRALINE HCL 100 MG PO TABS: 150.0000 mg | ORAL_TABLET | Freq: Every day | ORAL | 5 refills | Status: DC

## 2018-04-21 MED ORDER — SERTRALINE HCL 100 MG PO TABS: 200.0000 mg | ORAL_TABLET | Freq: Every day | ORAL | 5 refills | Status: DC

## 2018-04-21 NOTE — Telephone Encounter (Signed)
Yes you can call pharmacy and cancel the 150 mg and  Refill the 200 mg

## 2018-04-21 NOTE — Telephone Encounter (Signed)
Is it ok to send in a new prescription stating 200mg  once daily. Pt stated that she has been taking that since 03/27/2018.

## 2018-04-21 NOTE — Telephone Encounter (Signed)
Ok to fill Historical provider. Last OV 04/02/18

## 2018-04-21 NOTE — Telephone Encounter (Signed)
Patient states that since 03/27/18 she has been taking 200mg  a day of Zoloft. Please advise.

## 2018-04-21 NOTE — Telephone Encounter (Signed)
Pt has only one pill. Pt is aware may take up to 3 business day

## 2018-04-21 NOTE — Telephone Encounter (Signed)
DOSE INCREASED TO 200 MG  AND SENT.  PLESAE  CALL PHARMACY  TO  DC THE 150 MG

## 2018-04-21 NOTE — Telephone Encounter (Signed)
Patient did not pick up RX that was sent in today she would like to wait until it is corrected before hand.

## 2018-04-21 NOTE — Telephone Encounter (Signed)
Spoke with pharmacy and discontinued the 150mg  zoloft.

## 2018-04-21 NOTE — Patient Outreach (Signed)
[facility]) Care Management  04/21/2018  Deborah Velazquez 1946-08-01 [id number]  9year oldfemalereferred to [place] [facility] by nurse referral line. [place] services requested for medication management for recent diagnosis of Parkinson's Disease and anxiety.PMHx includes, but not limited XL:KGMWNUUVO'Z Disease, anxiety, hyperlipidemia, restless leg syndrome.   Incoming call received from [name].  HIPAA identifiers verified.   Subjective: Deborah Velazquez reports that she is feeling very anxious today.  She states that she is now seeing a psychiatrist for her anxiety named Deborah Velazquez and she has been started on Venlafaxine.  She says that she takes Zoloft, Venlafaxine and Sinemet in the morning and then by noon she does not have an appetite to eat her lunch or supper.  She reports that that she only recently started on the Venlafaxine and her Zoloft was increased to 200 mg.  Assessment: Counseled patient that venlafaxine may cause decreased appetite, but that she should give her body time do become accustomed to the new medication.  Informed her to notify Deborah Velazquez if the problem persists.  Also, counseled her to never discontinued her medications without her doctor's approval because doing so could be dangerous.  She verbalized understanding.  Deborah Velazquez, PharmD Clinical Pharmacist [place] [phone]

## 2018-04-27 DIAGNOSIS — F411 Generalized anxiety disorder: Secondary | ICD-10-CM | POA: Diagnosis not present

## 2018-04-27 DIAGNOSIS — F4321 Adjustment disorder with depressed mood: Secondary | ICD-10-CM | POA: Diagnosis not present

## 2018-04-29 NOTE — Telephone Encounter (Signed)
This encounter was created in error - please disregard.

## 2018-05-01 ENCOUNTER — Other Ambulatory Visit: Payer: Self-pay

## 2018-05-01 NOTE — Patient Outreach (Signed)
[facility]) Care Management  05/01/2018  Deborah Velazquez 11-09-1946 [id number]   Telephone assessment:  Received return call from patient.  HIPAA verified with patient. Patient states her anxiety is terrible today.  Patient reports she was hoping the increase in her medication was going to make her feel better.  Patient states she is not scheduled to see the psychiatrist until 05/13/18.  Patient states she has a follow up scheduled with her primary MD within the next 2 weeks and scheduled for her first visit with the psychologist on 05/02/18. Patient reports she continues to take her anxiety medication as prescribed.  Patient again states her anxiety seems to be worse today than it has been since taking the increased dose of her medication. RNCM advised patient to call her psychiatrist office if continues with increased symptoms. Patient verbalized understanding.  Patient denies any other symptoms at this time.  RNCM advised patient to keep follow up appointments.  RNCM advised patient to notify MD of any changes in condition prior to scheduled appointment.  PLAN:  RNCM will follow up with patient within 2 weeks.   Quinn Plowman RN,BSN,CCM [facility] Telephonic  [phone]

## 2018-05-01 NOTE — Patient Outreach (Signed)
[facility]) Care Management  05/01/2018  Deborah Velazquez 05-22-1946 [id number]   Telephone assessment follow up.   Telephone call to patient.  Unable to reach patient. HIPAA compliant voice message left with call back phone number.   PLAN: RNCM will attempt 2nd telephone call to patient within 4 business days. RNCM will send outreach letter.   Quinn Plowman RN,BSN,CCM [facility] Telephonic  [phone]

## 2018-05-02 DIAGNOSIS — F411 Generalized anxiety disorder: Secondary | ICD-10-CM | POA: Diagnosis not present

## 2018-05-02 DIAGNOSIS — F4321 Adjustment disorder with depressed mood: Secondary | ICD-10-CM | POA: Diagnosis not present

## 2018-05-06 ENCOUNTER — Ambulatory Visit: Payer: Self-pay

## 2018-05-12 ENCOUNTER — Encounter: Payer: Self-pay | Admitting: Internal Medicine

## 2018-05-12 ENCOUNTER — Ambulatory Visit (INDEPENDENT_AMBULATORY_CARE_PROVIDER_SITE_OTHER): Payer: PPO | Admitting: Internal Medicine

## 2018-05-12 DIAGNOSIS — R413 Other amnesia: Secondary | ICD-10-CM

## 2018-05-12 DIAGNOSIS — R252 Cramp and spasm: Secondary | ICD-10-CM

## 2018-05-12 DIAGNOSIS — F419 Anxiety disorder, unspecified: Secondary | ICD-10-CM

## 2018-05-12 DIAGNOSIS — M79605 Pain in left leg: Secondary | ICD-10-CM

## 2018-05-12 DIAGNOSIS — M79604 Pain in right leg: Secondary | ICD-10-CM | POA: Diagnosis not present

## 2018-05-12 DIAGNOSIS — R7303 Prediabetes: Secondary | ICD-10-CM

## 2018-05-12 MED ORDER — GABAPENTIN 100 MG PO CAPS
ORAL_CAPSULE | ORAL | 2 refills | Status: DC
Start: 2018-05-12 — End: 2018-07-23

## 2018-05-12 NOTE — Patient Instructions (Addendum)
For your neuropathy:    I have reduced the gabapentin dose to 100 mg so you can start taking it during the day.  I want you to take  1 capsule with breakfast, lunch and dinner,  And 2 at bedtime     You have "prediabetes" .  This means you need to start reducing your intake of starches and sugary foods.  Here are a few ways to do that:   Nature's Own Double Fiber whole wheat is low in carbohydrates and higher in fiber   Ezekiel Bread is the lowest carb loaf bread,  but also $$$$.  YOu can find it in the frozen bread section at [employer] and [employer]  Increase your intake of  green vegetables, cauliflower.   Less potatoes,  Less corn  Less bananas , pineapple and watermelon .  Breyers low carb ice cream  bars and fudgsicles  That taste great and are 5 g sugar    KIND BARS  HAVE  A LOW GLYCEMIC INDEX    FOR BREAKFAST:  EGGS AND BREAKFAST MEAT PLUS LOW CARB TOAST  WITHOUT JELLY   You might want to try a premixed protein drink called Premier Protein shake for breakfast or late night snack . It is great tasting,   very low sugar and available of < $2 serving at [facility] and  In bulk for $1.50/serving at [employer] and [employer]  .    Nutritional analysis :  160 cal  30 g protein  1 g sugar 50% calcium needs   Vladimir Faster and [employer]  Danton Clap now makes a frozen breakfast frittata that can be microwaved in 2 minutes and is very low carb. Frittats are similar to quiches without the crust

## 2018-05-12 NOTE — Progress Notes (Signed)
Subjective:  Patient ID: Deborah Velazquez, female    DOB: 1946/08/22  Age: 72 y.o. MRN: [id number]  CC: Diagnoses of Prediabetes, Lower extremity pain, bilateral, Anxiety, Loss of memory, and Leg cramps were pertinent to this visit.  HPI [name] presents for follow up on multiple issues diagnosed at initial visit  1) elevated blood sugar  Secondary to  prediabetes . Review of medical records indicate chronic issue. Low glycemic index diet discussed  In detail/   2) Anxiety : lifelong,  aggravated by dx of Parkinson's. Disease.  She has been established with Dr. Nicolasa Ducking . clonazepam was stopped Started citalopram last Saturday 10 mg starting dose , going up to 20 mg on Thursday   Still taking 200 mg sertraline .  Already feeling better.     3) Leg cramps;  Intermittent last episode occurred 2 years ago.  Responding to 62meloxicam and flexeril. Had a cramp this morning (calf and thigh)  . Drinking  " lot of water"    4) has had distal neuropathy for years,  Now not walking as far because the foot pain discourages her and is aggravated by walking,  R > L .  No prior workup   Lab Results  Component Value Date   HGBA1C 6.3 04/02/2018      Taking gabapentin at night along with atorvastatin and co q 10  Lab Results  Component Value Date   HGBA1C 6.3 04/02/2018     Outpatient Medications Prior to Visit  Medication Sig Dispense Refill  . atorvastatin (LIPITOR) 10 MG tablet Take 1 tablet (10 mg total) by mouth at bedtime. 90 tablet 1  . carbidopa-levodopa (SINEMET CR) 50-200 MG tablet Take 1 tablet by mouth at bedtime.    . carbidopa-levodopa (SINEMET IR) 25-100 MG tablet 2 in the morning, 1 in the afternoon, 1 in the evening (Patient taking differently: 1-2 tablets. 2 in the morning, 1 in the noon, 1 in the 1600) 360 tablet 1  . citalopram (CELEXA) 20 MG tablet TK 1/2 T PO D FOR 5 DAYS THEN INCREASE TO 1 D  0  . co-enzyme Q-10 30 MG capsule Take 100 mg by mouth daily.    .  cyclobenzaprine (FLEXERIL) 5 MG tablet TK 1 T PO Q 8 H PRN FOR MUSCLE  SPAMS  1  . meloxicam (MOBIC) 15 MG tablet TK 1 T PO QD PRN FOR MUSCLE SPAMS  2  . Omega-3 Fatty Acids (FISH OIL) 1000 MG CAPS Take by mouth.    . psyllium (METAMUCIL) 58.6 % powder Take 1 packet by mouth 3 (three) times daily.    . sertraline (ZOLOFT) 100 MG tablet Take 2 tablets (200 mg total) by mouth daily. 60 tablet 5  . vitamin B-12 (CYANOCOBALAMIN) 250 MCG tablet Take 250 mcg by mouth daily.    . clonazePAM (KLONOPIN) 1 MG tablet 1/2 to 1 pill 2 times daily 60 tablet 1  . gabapentin (NEURONTIN) 300 MG capsule TAKE 1 CAPSULE(300 MG) BY MOUTH EVERY NIGHT    . carbidopa-levodopa (SINEMET IR) 25-100 MG tablet 2 in the morning, 1 in the afternoon, 1 in the evening (Patient not taking: Reported on 05/12/2018) 120 tablet 5  . carbidopa-levodopa (SINEMET IR) 25-100 MG tablet 2 at 8am/1 at noon/1 at 4pm (Patient not taking: Reported on 05/12/2018) 120 tablet 5  . gabapentin (NEURONTIN) 300 MG capsule Take 300 mg by mouth at bedtime.    Marland Kitchen ibuprofen (ADVIL,MOTRIN) 200 MG tablet Take 200 mg by  mouth every 6 (six) hours as needed.    . venlafaxine XR (EFFEXOR-XR) 37.5 MG 24 hr capsule Take 37.5 mg by mouth daily with breakfast.     No facility-administered medications prior to visit.     Review of Systems;  Patient denies headache, fevers, malaise, unintentional weight loss, skin rash, eye pain, sinus congestion and sinus pain, sore throat, dysphagia,  hemoptysis , cough, dyspnea, wheezing, chest pain, palpitations, orthopnea, edema, abdominal pain, nausea, melena, diarrhea, constipation, flank pain, dysuria, hematuria, urinary  Frequency, nocturia, numbness, tingling, seizures,  Focal weakness, Loss of consciousness,  Tremor, insomnia, depression, anxiety, and suicidal ideation.      Objective:  BP 112/76 (BP Location: Left Arm, Patient Position: Sitting, Cuff Size: Normal)   Pulse 84   Temp 98.6 F (37 C) (Oral)   Resp 15    Ht 5\' 6"  (1.676 m)   Wt 143 lb 6.4 oz (65 kg)   SpO2 95%   BMI 23.15 kg/m   BP Readings from Last 3 Encounters:  05/12/18 112/76  04/02/18 108/70  03/11/18 128/72    Wt Readings from Last 3 Encounters:  05/12/18 143 lb 6.4 oz (65 kg)  04/02/18 144 lb 3.2 oz (65.4 kg)  03/11/18 145 lb (65.8 kg)    General appearance: alert, cooperative and appears stated age Ears: normal TM's and external ear canals both ears Throat: lips, mucosa, and tongue normal; teeth and gums normal Neck: no adenopathy, no carotid bruit, supple, symmetrical, trachea midline and thyroid not enlarged, symmetric, no tenderness/mass/nodules Back: symmetric, no curvature. ROM normal. No CVA tenderness. Lungs: clear to auscultation bilaterally Heart: regular rate and rhythm, S1, S2 normal, no murmur, click, rub or gallop Abdomen: soft, non-tender; bowel sounds normal; no masses,  no organomegaly Pulses: 2+ and symmetric Skin: Skin color, texture, turgor normal. No rashes or lesions Lymph nodes: Cervical, supraclavicular, and axillary nodes normal.  Lab Results  Component Value Date   HGBA1C 6.3 04/02/2018    No results found for: CREATININE  Lab Results  Component Value Date   TSH 2.15 04/02/2018   HGBA1C 6.3 04/02/2018    Mm Screening Breast Tomo Bilateral  Result Date: 12/24/2016 CLINICAL DATA:  Screening. EXAM: 2D DIGITAL SCREENING BILATERAL MAMMOGRAM WITH CAD AND ADJUNCT TOMO COMPARISON:  Previous exam(s). ACR Breast Density Category b: There are scattered areas of fibroglandular density. FINDINGS: There are no findings suspicious for malignancy. Images were processed with CAD. IMPRESSION: No mammographic evidence of malignancy. A result letter of this screening mammogram will be mailed directly to the patient. RECOMMENDATION: Screening mammogram in one year. (Code:SM-B-01Y) BI-RADS CATEGORY  1: Negative. Electronically Signed   By: Lovey Newcomer M.D.   On: 12/24/2016 13:27    Assessment & Plan:    Problem List Items Addressed This Visit    Prediabetes    I have addressed  A1c and recommended adhering to a  a low glycemic index diet and regular exercise a minimum of 5 days per week.        Lower extremity pain, bilateral    Presumed to be a neuropathy based on description of pain,  But no prior workup has been done with EMG nerve conduction studies .  Changing gabapentin dosing to 200 mg qac and 200 mg at bedtime       Loss of memory    Unclear chronicity; may be secondary to PD      Leg cramps    Managed with meloxicam and prn flexeril  Anxiety    Chronic,  Greatly aggravated by her recent diagnosis of PD.  Improving with addition of citalopram to zoloft.       Relevant Medications   citalopram (CELEXA) 20 MG tablet      I have discontinued Skylor G. Heuer's gabapentin, ibuprofen, clonazePAM, and venlafaxine XR. I have also changed her gabapentin. Additionally, I am having her maintain her Fish Oil, co-enzyme Q-10, vitamin B-12, psyllium, carbidopa-levodopa, carbidopa-levodopa, atorvastatin, sertraline, citalopram, cyclobenzaprine, and meloxicam.  Meds ordered this encounter  Medications  . gabapentin (NEURONTIN) 100 MG capsule    Sig: 1 capsule at breakfast lunch and dinner ,  2 at bedtime    Dispense:  150 capsule    Refill:  2    Medications Discontinued During This Encounter  Medication Reason  . carbidopa-levodopa (SINEMET IR) 92-330 MG tablet Duplicate  . carbidopa-levodopa (SINEMET IR) 07-622 MG tablet Duplicate  . gabapentin (NEURONTIN) 633 MG capsule Duplicate  . ibuprofen (ADVIL,MOTRIN) 200 MG tablet Patient has not taken in last 30 days  . venlafaxine XR (EFFEXOR-XR) 37.5 MG 24 hr capsule Patient has not taken in last 30 days  . clonazePAM (KLONOPIN) 1 MG tablet   . gabapentin (NEURONTIN) 300 MG capsule     Follow-up: Return for /prediabetes .   [name], MD

## 2018-05-13 ENCOUNTER — Encounter: Payer: Self-pay | Admitting: Internal Medicine

## 2018-05-13 DIAGNOSIS — R252 Cramp and spasm: Secondary | ICD-10-CM | POA: Insufficient documentation

## 2018-05-13 NOTE — Assessment & Plan Note (Signed)
Unclear chronicity; may be secondary to PD

## 2018-05-13 NOTE — Assessment & Plan Note (Signed)
Managed with meloxicam and prn flexeril

## 2018-05-13 NOTE — Assessment & Plan Note (Signed)
I have addressed  A1c and recommended adhering to a  a low glycemic index diet and regular exercise a minimum of 5 days per week.

## 2018-05-13 NOTE — Assessment & Plan Note (Signed)
Presumed to be a neuropathy based on description of pain,  But no prior workup has been done with EMG nerve conduction studies .  Changing gabapentin dosing to 200 mg qac and 200 mg at bedtime

## 2018-05-13 NOTE — Assessment & Plan Note (Signed)
Chronic,  Greatly aggravated by her recent diagnosis of PD.  Improving with addition of citalopram to zoloft.

## 2018-05-14 ENCOUNTER — Other Ambulatory Visit: Payer: Self-pay

## 2018-05-14 DIAGNOSIS — F411 Generalized anxiety disorder: Secondary | ICD-10-CM | POA: Diagnosis not present

## 2018-05-14 DIAGNOSIS — F4321 Adjustment disorder with depressed mood: Secondary | ICD-10-CM | POA: Diagnosis not present

## 2018-05-14 NOTE — Patient Outreach (Signed)
[place] [facility]) Care Management  05/14/2018  Deborah Velazquez 1945-12-29 [id number]   Telephone assessment:  Referral date: 02/02/18 Insurance: Health team advantage Attempt #1  Telephone call to patient for telephone assessment follow up. Unable to reach patient. HIPAA compliant voice message left with call back phone number.   PLAN: RNCM will attempt 2nd telephone call to patient within 4 business days. RNCM will send outreach letter.   Quinn Plowman RN,BSN,CCM [facility] Telephonic  [phone]

## 2018-05-19 ENCOUNTER — Encounter: Payer: PPO | Admitting: Psychology

## 2018-05-19 DIAGNOSIS — F411 Generalized anxiety disorder: Secondary | ICD-10-CM | POA: Diagnosis not present

## 2018-05-19 DIAGNOSIS — F4321 Adjustment disorder with depressed mood: Secondary | ICD-10-CM | POA: Diagnosis not present

## 2018-05-20 ENCOUNTER — Other Ambulatory Visit: Payer: Self-pay

## 2018-05-20 NOTE — Patient Outreach (Signed)
[place] [facility]) Care Management  05/20/2018  Deborah Velazquez [birth year] [id number]  Telephone assessment:  Referral date: 02/02/18 Insurance: Health team advantage Attempt #2  Telephone call to patient for telephone assessment follow up. Unable to reach patient. HIPAA compliant voice message left with call back phone number.   PLAN: RNCM will attempt 3rd telephone call to patient within 4 business days.   Quinn Plowman RN,BSN,CCM [facility] Telephonic  [phone]

## 2018-05-22 ENCOUNTER — Other Ambulatory Visit: Payer: Self-pay

## 2018-05-22 NOTE — Patient Outreach (Signed)
[place] [facility]) Care Management  05/22/2018  Deborah Velazquez 08-Nov-1946 [id number]   Telephone assessment: Telephone call to patient for follow up. HIPAA verified.  Patient states she has had some additional medication changes since speaking with RNCM last. Patient states her anxiety has gotten better.  Patient reports her anxiety has previously been around 10.  States now her anxiety may range from 4 to 8 on a scale of 1-10.  Patient states she is taking her medications as prescribed.  Patient states she has no energy so she is resting more.  RNCM reviewed falls safety with patient due to medication side effects.  Patient reports she is having a little more constipation. States she is taking metamucil.  RNCM inquired if patient was taken as directed on metamucil container.  Patient states, "I'm trying to."  RNCM advised patient to take her metamucil as directed. Advised patient to eat more fruits, vegetables and drink more water. RNCM suggested prune juice if patient able to tolerate.  Medications reviewed with patient.  RNCM advised patient to notify MD of any changes in condition prior to scheduled appointment. RNCM verified patient aware of 911 services for urgent/ emergent needs.  PLAN:  RNCM will follow up with patient in month of July 2019 RNCM will send patient Fall prevention EMMI education material   Quinn Plowman RN,BSN,CCM [facility] Telephonic  [phone]

## 2018-05-25 ENCOUNTER — Other Ambulatory Visit: Payer: Self-pay

## 2018-05-25 ENCOUNTER — Ambulatory Visit: Payer: Self-pay

## 2018-05-25 NOTE — Patient Outreach (Signed)
[place] [facility]) Care Management  05/25/2018  Deborah Velazquez 1946/06/16 [id number]   54year oldfemalereferred to [place] [facility] by nurse referral line. [place] services requested for medication management for recent diagnosis of Parkinson's Disease and anxiety.PMHx includes, but not limited XK:GYJEHUDJS'H Disease, anxiety, hyperlipidemia, restless leg syndrome.   Incoming call received from Ms. Mcleroy. HIPAA identifiers verified.   Subjective: Mrs. Knighton was questioning whether she was taking all of her morning medications correctly.  She is seeing a new psychiatrist and wanted to make sure that she "could take all her morning medications at the same time."  Per her medication list from her psychiatrist, she is to take both sertraline (dose decreased) and citalopram.  She states that she is still having "nervous spells," but that they are improving.  She states that she has always "been in charge my whole life and I am not in charge now" and feels that this is why she has increased anxiety.   She reports having leg muscle spasms that are sometimes very severe.  She states that she is currently in an exercise program provided by a Fatima Sanger at [facility] for Parkinson's patients.    Medication Management: Mrs. Mcnaught was able to verbalize correctly her prescription regimen based on the Epic medication list.  Informed her that she can take her morning medications together.  Encouraged her to stay busy and keep exercising to help with her anxiety.  Informed her to call me anytime with medication questions.   [name], PharmD Clinical Pharmacist [facility] [phone]

## 2018-05-27 ENCOUNTER — Other Ambulatory Visit: Payer: Self-pay | Admitting: Internal Medicine

## 2018-06-09 ENCOUNTER — Telehealth: Payer: Self-pay | Admitting: Internal Medicine

## 2018-06-09 NOTE — Telephone Encounter (Signed)
Patient scheduled.

## 2018-06-09 NOTE — Telephone Encounter (Signed)
Please call ms Gladd and schedule her for 12:00 Thursday (this week) if she can do that time.  She has texged me about worsening leg pain

## 2018-06-11 ENCOUNTER — Ambulatory Visit (INDEPENDENT_AMBULATORY_CARE_PROVIDER_SITE_OTHER): Payer: PPO | Admitting: Internal Medicine

## 2018-06-11 ENCOUNTER — Encounter: Payer: Self-pay | Admitting: Internal Medicine

## 2018-06-11 ENCOUNTER — Other Ambulatory Visit: Payer: Self-pay

## 2018-06-11 VITALS — BP 102/70 | HR 82 | Temp 98.2°F | Resp 16 | Ht 66.0 in | Wt 142.2 lb

## 2018-06-11 DIAGNOSIS — M79604 Pain in right leg: Secondary | ICD-10-CM | POA: Diagnosis not present

## 2018-06-11 DIAGNOSIS — M79605 Pain in left leg: Secondary | ICD-10-CM

## 2018-06-11 DIAGNOSIS — R29898 Other symptoms and signs involving the musculoskeletal system: Secondary | ICD-10-CM

## 2018-06-11 DIAGNOSIS — R252 Cramp and spasm: Secondary | ICD-10-CM

## 2018-06-11 MED ORDER — DOCUSATE SODIUM 100 MG PO CAPS: 200.0000 mg | ORAL_CAPSULE | Freq: Every day | ORAL | 5 refills | Status: DC

## 2018-06-11 NOTE — Patient Instructions (Addendum)
Breyer's Carb Smart Ice cream and Fudgsicles are only 5  g sugar, so you can have one every night !    For your constipation :  Stop the miralax for now .   Stop the flexeril too.   Start taking Colace ( docusate)   up to 200 mg daily .  It Can  be taken at bedtime  With your other medications   You can have one glass of wine per night if you want to   Your muscle pains are due to disuse and atrophy.  You have no signs of spinal stenosis or sciatica  I am ordering pHysical therapy to help you gain your strength back

## 2018-06-11 NOTE — Progress Notes (Signed)
Subjective:  Patient ID: Deborah Velazquez, female    DOB: [birth year]  Age: 72 y.o. MRN: [id number]  CC: The primary encounter diagnosis was Complaints of leg weakness. Diagnoses of Leg cramps and Lower extremity pain, bilateral were also pertinent to this visit.  HPI [name] presents for evaluation and treatment of persistent bilateral leg pain and muscle cramping .  The pain has different qualities,  She states that she feels "pins and needles"  From the knees down bilaterally ,  For the past several weeksBut also has pain that will be brought on by standing for even 5 minutes and will localize to thighs,  Knees and calves,  Different places at different times.    Neuropathic pain started 2 weeks ago  Feels like ants crawling  Constipated:  3 BM per week, always strains.  Using miralax daily   Outpatient Medications Prior to Visit  Medication Sig Dispense Refill  . atorvastatin (LIPITOR) 10 MG tablet Take 1 tablet (10 mg total) by mouth at bedtime. 90 tablet 1  . carbidopa-levodopa (SINEMET CR) 50-200 MG tablet Take 1 tablet by mouth at bedtime.    . carbidopa-levodopa (SINEMET IR) 25-100 MG tablet 2 in the morning, 1 in the afternoon, 1 in the evening (Patient taking differently: 1-2 tablets. 2 in the morning, 1 in the noon, 1 in the 1600) 360 tablet 1  . citalopram (CELEXA) 40 MG tablet Take 40 mg by mouth daily.    Marland Kitchen co-enzyme Q-10 30 MG capsule Take 100 mg by mouth daily.    Marland Kitchen gabapentin (NEURONTIN) 100 MG capsule 1 capsule at breakfast lunch and dinner ,  2 at bedtime 150 capsule 2  . meloxicam (MOBIC) 15 MG tablet TK 1 T PO QD PRN FOR MUSCLE SPAMS  2  . Omega-3 Fatty Acids (FISH OIL) 1000 MG CAPS Take by mouth.    . sertraline (ZOLOFT) 100 MG tablet Take 2 tablets (200 mg total) by mouth daily. (Patient taking differently: Take 100 mg by mouth daily. ) 60 tablet 5  . vitamin B-12 (CYANOCOBALAMIN) 250 MCG tablet Take 250 mcg by mouth daily.    . cyclobenzaprine (FLEXERIL) 5 MG  tablet TK 2 T PO Q 8 H PRN FOR MUSCLE  SPAMS  1  . psyllium (METAMUCIL) 58.6 % powder Take 1 packet by mouth 3 (three) times daily.     No facility-administered medications prior to visit.     Review of Systems;  Patient denies headache, fevers, malaise, unintentional weight loss, skin rash, eye pain, sinus congestion and sinus pain, sore throat, dysphagia,  hemoptysis , cough, dyspnea, wheezing, chest pain, palpitations, orthopnea, edema, abdominal pain, nausea, melena, diarrhea, constipation, flank pain, dysuria, hematuria, urinary  Frequency, nocturia, numbness, tingling, seizures,  Focal weakness, Loss of consciousness,  Tremor, insomnia, depression, anxiety, and suicidal ideation.      Objective:  BP 102/70 (BP Location: Left Arm, Patient Position: Sitting, Cuff Size: Normal)   Pulse 82   Temp 98.2 F (36.8 C) (Oral)   Resp 16   Ht 5\' 6"  (1.676 m)   Wt 142 lb 3.2 oz (64.5 kg)   SpO2 96%   BMI 22.95 kg/m   BP Readings from Last 3 Encounters:  06/11/18 102/70  05/12/18 112/76  04/02/18 108/70    Wt Readings from Last 3 Encounters:  06/11/18 142 lb 3.2 oz (64.5 kg)  05/12/18 143 lb 6.4 oz (65 kg)  04/02/18 144 lb 3.2 oz (65.4 kg)    General  appearance: alert, cooperative and appears stated age Ears: normal TM's and external ear canals both ears Throat: lips, mucosa, and tongue normal; teeth and gums normal Neck: no adenopathy, no carotid bruit, supple, symmetrical, trachea midline and thyroid not enlarged, symmetric, no tenderness/mass/nodules Back: symmetric, no curvature. ROM normal. No CVA tenderness. Lungs: clear to auscultation bilaterally Heart: regular rate and rhythm, S1, S2 normal, no murmur, click, rub or gallop Abdomen: soft, non-tender; bowel sounds normal; no masses,  no organomegaly Pulses: 2+ and symmetric Skin: Skin color, texture, turgor normal. No rashes or lesions Lymph nodes: Cervical, supraclavicular, and axillary nodes normal. Neuro: CNs 2-12  intact. DTRs 3/4 in biceps, brachioradialis, patellars and achilles. Muscle strength 5/5 in upper and lower exremities. Fine resting tremor bilaterally both hands cerebellar function normal. Romberg negative.  No pronator drift.   Gait normal.   Lab Results  Component Value Date   HGBA1C 6.3 04/02/2018    No results found for: CREATININE  Lab Results  Component Value Date   TSH 2.15 04/02/2018   HGBA1C 6.3 04/02/2018    Mm Screening Breast Tomo Bilateral  Result Date: 12/24/2016 CLINICAL DATA:  Screening. EXAM: 2D DIGITAL SCREENING BILATERAL MAMMOGRAM WITH CAD AND ADJUNCT TOMO COMPARISON:  Previous exam(s). ACR Breast Density Category b: There are scattered areas of fibroglandular density. FINDINGS: There are no findings suspicious for malignancy. Images were processed with CAD. IMPRESSION: No mammographic evidence of malignancy. A result letter of this screening mammogram will be mailed directly to the patient. RECOMMENDATION: Screening mammogram in one year. (Code:SM-B-01Y) BI-RADS CATEGORY  1: Negative. Electronically Signed   By: Lovey Newcomer M.D.   On: 12/24/2016 13:27    Assessment & Plan:   Problem List Items Addressed This Visit    Lower extremity pain, bilateral    She has no evidence of disk disease or weakness on exam.  She has been largely sedentary for the past year and has lost muscle strength.  PT referral advised.       Leg cramps    Managed with meloxicam and prn flexeril  With no real benefit.  Due to muscle weakness.  Pt referral in progress        Other Visit Diagnoses    Complaints of leg weakness    -  Primary   Relevant Orders   Ambulatory referral to Physical Therapy    A total of 40 minutes was spent with patient more than half of which was spent in counseling patient on the above mentioned issues , reviewing and explaining recent labs and imaging studies done, and coordination of care.  I have discontinued Ruqaya G. Caggiano's psyllium and  cyclobenzaprine. I am also having her start on docusate sodium. Additionally, I am having her maintain her Fish Oil, co-enzyme Q-10, vitamin B-12, carbidopa-levodopa, carbidopa-levodopa, atorvastatin, sertraline, meloxicam, gabapentin, and citalopram.  Meds ordered this encounter  Medications  . docusate sodium (COLACE) 100 MG capsule    Sig: Take 2 capsules (200 mg total) by mouth at bedtime.    Dispense:  60 capsule    Refill:  5    Medications Discontinued During This Encounter  Medication Reason  . psyllium (METAMUCIL) 58.6 % powder   . cyclobenzaprine (FLEXERIL) 5 MG tablet     Follow-up: Return in about 3 months (around 09/11/2018) for lg weakness.   [name], MD

## 2018-06-11 NOTE — Patient Outreach (Addendum)
[place] [facility]) Care Management  06/11/2018  Deborah Velazquez May 26, 1946 [id number]  Telephone assessment:   Telephone call to patient for follow up assessment. HIPAA verified. Patient states she is doing better.  She states she continues to have leg pain.  Reports she is going to see her doctor today regarding this. Patient states she has not been able to attend the Parkinson's boxing class due to the leg pain but states she continues to go to the [place] [facility] [place] exercise class.    Patient states, " I am doing a whole lot better regarding my nervousness/ anxiety."  Patient reports she has some days where she does not have any symptoms at all.  She reports on a scale of 0 to 10 with 10 being worse her anxiety level is ranging from 3-4.  She reports she continues to take her medications as prescribed.  Patient states she is still dealing with the constipation. RNCM advised patient to discuss this with her doctor at today's appointment. Patient verbalized understanding.  Patient reports she sustained a fall approximately 1 week ago. Denies injury. Patient states her night light went out and when she was up in the middle of the night she couldn't see where she was going well and fell. Patient states she has since then replaced the bulb in her night light.  RNCM reviewed fall precautions with patient Patient denies any additional concerns or needs at this time.  Patient verbally agreed to next outreach with RNCM.   PLAN: RNCM will follow up with patient within the month of August 2019.  Quinn Plowman RN,BSN,CCM [facility] Telephonic  [phone]

## 2018-06-13 NOTE — Assessment & Plan Note (Signed)
She has no evidence of disk disease or weakness on exam.  She has been largely sedentary for the past year and has lost muscle strength.  PT referral advised.

## 2018-06-13 NOTE — Assessment & Plan Note (Signed)
Managed with meloxicam and prn flexeril  With no real benefit.  Due to muscle weakness.  Pt referral in progress

## 2018-06-30 ENCOUNTER — Ambulatory Visit: Payer: PPO | Attending: Internal Medicine | Admitting: Physical Therapy

## 2018-06-30 ENCOUNTER — Other Ambulatory Visit: Payer: Self-pay

## 2018-06-30 ENCOUNTER — Encounter: Payer: Self-pay | Admitting: Physical Therapy

## 2018-06-30 DIAGNOSIS — R2689 Other abnormalities of gait and mobility: Secondary | ICD-10-CM | POA: Insufficient documentation

## 2018-06-30 DIAGNOSIS — M79604 Pain in right leg: Secondary | ICD-10-CM | POA: Diagnosis not present

## 2018-06-30 DIAGNOSIS — R262 Difficulty in walking, not elsewhere classified: Secondary | ICD-10-CM | POA: Insufficient documentation

## 2018-06-30 NOTE — Therapy (Addendum)
[place] MAIN [facility] SERVICES [address] [place], [place], [address] Phone: [phone]   Fax:  [phone]  Physical Therapy Evaluation  Patient Details  Name: Deborah Velazquez MRN: [id number] Date of Birth: 10/23/1946 Referring Provider: [name]   Encounter Date: 06/30/2018  PT End of Session - 06/30/18 1714    Visit Number  1    Number of Visits  17    Date for PT Re-Evaluation  08/25/18    PT Start Time  0500    PT Stop Time  0600    PT Time Calculation (min)  60 min    Equipment Utilized During Treatment  Gait belt    Activity Tolerance  Patient tolerated treatment well    Behavior During Therapy  [facility] for tasks assessed/performed       Past Medical History:  Diagnosis Date  . Anxiety   . Hyperlipemia   . Irritable bowel syndrome   . Parkinson disease ([place])   . Restless leg syndrome   . RSD (reflex sympathetic dystrophy)    pt denies    Past Surgical History:  Procedure Laterality Date  . APPENDECTOMY    . BREAST BIOPSY Left 08/16/2013   neg  . CATARACT EXTRACTION Left   . COLONOSCOPY    . COLONOSCOPY WITH PROPOFOL N/A 06/20/2015   Procedure: COLONOSCOPY WITH PROPOFOL;  Surgeon: Lollie Sails, MD;  Location: [facility] ENDOSCOPY;  Service: Endoscopy;  Laterality: N/A;  . FRACTURE SURGERY     wrist - right  . PARS PLANA VITRECTOMY Left 2010    There were no vitals filed for this visit.   Subjective Assessment - 06/30/18 1700    Subjective  Patient is having leg pain and weakness.     Pertinent History  Patient is reporting B leg pain in achilles tendon for the last few years. She was told to drink more water and it did help some. Her pain has gotten worse and worse over the years. Sometimes the pain is from the buttocks to the back of the knee and it also can be from the back of the kneee to the heel. It is intermittent pain and is severe 8/10. It hurts so much that she has to stop.She has onset of leg pain during  standing and walking . She recently found out that she has parkonsons diseaese.     Currently in Pain?  Yes    Pain Score  8     Pain Location  Leg    Pain Orientation  Right;Left    Pain Descriptors / Indicators  Cramping;Spasm;Shooting    Pain Type  Chronic pain    Pain Onset  More than a month ago    Pain Frequency  Intermittent    Aggravating Factors   weight bearing, stretching    Pain Relieving Factors  getting off her feet    Multiple Pain Sites  No         [facility] PT Assessment - 06/30/18 1656      Assessment   Medical Diagnosis  Leg pain, weakness due to disuse.Marland Kitchen    Referring Provider  Deborra Medina L    Onset Date/Surgical Date  06/11/18    Hand Dominance  Right    Prior Therapy  no      Precautions   Precautions  Fall      Restrictions   Weight Bearing Restrictions  No      Balance Screen   Has the  patient fallen in the past 6 months  Yes    How many times?  1    Has the patient had a decrease in activity level because of a fear of falling?   Yes    Is the patient reluctant to leave their home because of a fear of falling?   No      Home Film/video editor residence    Living Arrangements  Spouse/significant other    Available Help at Discharge  Family    Type of [place] to enter    Entrance Stairs-Number of Steps  -- 2    Entrance Stairs-Rails  Right;Left;Can reach both    [place]  One level    [place]  Bedside commode      Prior Function   Level of Independence  Independent with household mobility with device    Vocation  Retired    Nature conservation officer the house, garden      Cognition   Overall Cognitive Status  Within Functional Limits for tasks assessed    Attention  Focused    Focused Attention  Appears intact        POSTURE flexed trunk   PROM/AROM:   R hip 90 degs flex B hips abd  ER left20 ER right 10 IR left 25 IR right 20  Decreased trunk extension with prone press up  x 10 reps  STRENGTH:  Graded on a 0-5 scale Muscle Group Left Right                          Hip Flex 4+/5 -4/5  Hip Abd 5/5 -4/5  Hip Add 3/5 3/5  Hip Ext 2/5 2/5  Hip IR/ER 3/5 3/5  Knee Flex 5/5 5/5  Knee Ext 5/5 5/5  Ankle DF 4/5 4/5  Ankle PF 4/5 4/5   SENSATION: Bilateral great toe burning and stinging with R side > than left side , tingling in bottom of feet intermittent bilaterally  SPECIAL TESTS: + FABER R with pain in back   + ELY BLE  Decreased flexibility in hamstrings and calfs bilaterally   FUNCTIONAL MOBILITY: transfers with several attempts     BALANCE: Static Standing Balance  Normal Able to maintain standing balance against maximal resistance   Good Able to maintain standing balance against moderate resistance   Good-/Fair+ Able to maintain standing balance against minimal resistance x  Fair Able to stand unsupported without UE support and without LOB for 1-2 min   Fair- Requires Min A and UE support to maintain standing without loss of balance   Poor+ Requires mod A and UE support to maintain standing without loss of balance   Poor Requires max A and UE support to maintain standing balance without loss    Standing Dynamic Balance  Normal Stand independently unsupported, able to weight shift and cross midline maximally   Good Stand independently unsupported, able to weight shift and cross midline moderately x  Good-/Fair+ Stand independently unsupported, able to weight shift across midline minimally   Fair Stand independently unsupported, weight shift, and reach ipsilaterally, loss of balance when crossing midline   Poor+ Able to stand with Min A and reach ipsilaterally, unable to weight shift   Poor Able to stand with Mod A and minimally reach ipsilaterally, unable to cross midline.       GAIT: Patient ambulates with decreased  step length and decreased gait speed with narrow base of support, flexed trunk, shuffling feet  OUTCOME MEASURES: TEST  Outcome Interpretation  5 times sit<>stand 20.57sec >34 yo, >15 sec indicates increased risk for falls  10 meter walk test   1.02              m/s <1.0 m/s indicates increased risk for falls; limited community ambulator  Timed up and Go    10.35             sec <14 sec indicates increased risk for falls                Treatment: Hamstring stretch 30 x 3 bilateral      .    Plan - 07/02/18 1410    Clinical Impression Statement . Patient presents with pain in BLE that she has had for 4 years , weakness and new onset of parkinsons. She has decreased BLE strength , decreased ROM with hips, back extension. She has decreased ability to ambulate intermittent and long distances She will benefit from skilled PT to improve flexibility in LE's and decrease pain in LE's.   Rehab Potential  Good    Clinical Impairments Affecting Rehab Potential  chronicity of condition, Parkinson's disease,     PT Frequency  2x / week    PT Duration  8 weeks    PT Treatment/Interventions  Neuromuscular re-education;Manual techniques;Therapeutic exercise;Therapeutic activities;Aquatic Therapy;Gait training;Patient/family education;Cryotherapy;Stair training;Balance training;Dry needling;Taping    PT Next Visit Plan  strengthening , stretching   PT Home Exercise Plan  Update HEP next visit.     Consulted and Agree with Plan of Care  Patient        Objective measurements completed on examination: See above findings.              PT Education - 06/30/18 1713    Education Details  plan of care    Person(s) Educated  Patient    Methods  Explanation    Comprehension  Verbalized understanding      Patient presents with pain in BLE that she has had for 4 years , weakness and new onset of parkinsons. She has decreased BLE strength , decreased ROM with hips, back extension. She has decreased ability to ambulate intermittent and long distances She will benefit from skilled PT to improve  flexibility in LE's and decrease pain in LE's              Patient will benefit from skilled therapeutic intervention in order to improve the following deficits and impairments:     Visit Diagnosis: Difficulty in walking, not elsewhere classified  Pain of right lower extremity  Other abnormalities of gait and mobility     Problem List Patient Active Problem List   Diagnosis Date Noted  . Leg cramps 05/13/2018  . Loss of memory 09/22/2017  . Lower extremity pain, bilateral 09/22/2017  . Parkinson's disease ([employer]) 08/19/2017  . Prediabetes 07/26/2016  . Pure hypercholesterolemia 12/23/2014  . Anxiety 06/24/2014    Alanson Puls , PT DPT 06/30/2018, 5:15 PM  [facility] MAIN [facility] SERVICES [address] [place], [place], [address] Phone: [phone]   Fax:  [phone]  Name: RAIYA STAINBACK MRN: [id number] Date of Birth: 1946-03-03

## 2018-07-02 ENCOUNTER — Ambulatory Visit: Payer: PPO | Admitting: Physical Therapy

## 2018-07-02 ENCOUNTER — Encounter: Payer: Self-pay | Admitting: Physical Therapy

## 2018-07-02 DIAGNOSIS — M79604 Pain in right leg: Secondary | ICD-10-CM

## 2018-07-02 DIAGNOSIS — F4321 Adjustment disorder with depressed mood: Secondary | ICD-10-CM | POA: Diagnosis not present

## 2018-07-02 DIAGNOSIS — R2689 Other abnormalities of gait and mobility: Secondary | ICD-10-CM

## 2018-07-02 DIAGNOSIS — F411 Generalized anxiety disorder: Secondary | ICD-10-CM | POA: Diagnosis not present

## 2018-07-02 DIAGNOSIS — R262 Difficulty in walking, not elsewhere classified: Secondary | ICD-10-CM

## 2018-07-02 NOTE — Addendum Note (Signed)
Addended by: Alanson Puls on: 07/02/2018 03:00 PM   Modules accepted: Orders

## 2018-07-02 NOTE — Therapy (Signed)
[place] MAIN [facility] SERVICES [address] [place], [place], [address] Phone: [phone]   Fax:  [phone]  Physical Therapy Treatment  Patient Details  Name: Deborah Velazquez MRN: [id number] Date of Birth: 05-06-1946 Referring Provider: [name]   Encounter Date: 07/02/2018  PT End of Session - 07/02/18 1407    Visit Number  2    Number of Visits  17    Date for PT Re-Evaluation  08/25/18    PT Start Time  0100    PT Stop Time  0145    PT Time Calculation (min)  45 min    Equipment Utilized During Treatment  Gait belt    Activity Tolerance  Patient tolerated treatment well    Behavior During Therapy  [facility] for tasks assessed/performed       Past Medical History:  Diagnosis Date  . Anxiety   . Hyperlipemia   . Irritable bowel syndrome   . Parkinson disease ([place])   . Restless leg syndrome   . RSD (reflex sympathetic dystrophy)    pt denies    Past Surgical History:  Procedure Laterality Date  . APPENDECTOMY    . BREAST BIOPSY Left 08/16/2013   neg  . CATARACT EXTRACTION Left   . COLONOSCOPY    . COLONOSCOPY WITH PROPOFOL N/A 06/20/2015   Procedure: COLONOSCOPY WITH PROPOFOL;  Surgeon: Lollie Sails, MD;  Location: [facility] ENDOSCOPY;  Service: Endoscopy;  Laterality: N/A;  . FRACTURE SURGERY     wrist - right  . PARS PLANA VITRECTOMY Left 2010    There were no vitals filed for this visit.  Subjective Assessment - 07/02/18 1309    Subjective  Patient is having leg pain and weakness. She has pain with intermediate walking today.    Pertinent History  Patient is reporting B leg pain in achilles tendon for the last few years. She was told to drink more water and it did help some. Her pain has gotten worse and worse over the years. Sometimes the pain is from the buttocks to the back of the knee and it also can be from the back of the kneee to the heel. It is intermittent pain and is severe 8/10. It hurts so much that she has  to stop.She has onset of leg pain during standing and walking . She recently found out that she has parkonsons diseaese.     Currently in Pain?  No/denies    Pain Score  0-No pain    Pain Onset  More than a month ago        Treatment: Manual therapy:  Long axis distraction BLE hips grade 4  X 8 mins x 2   RLE lateral glide ,grade 3 and 4, x 30 bouts x 3   Hamstring stretching BLE x 30 sec x 3   Calf gastroc and soleus stretching x 30 sec x 3  reviewed HEP of hamstring stretching  Patient reports pain walking into building in BLE and then no pain walking in from the waiting room to the therapy gym Patient reports no pain following therapy.                   PT Education - 07/02/18 1310    Education Details  HEP    Person(s) Educated  Patient    Methods  Explanation;Demonstration;Tactile cues;Verbal cues    Comprehension  Verbalized understanding       PT Short Term Goals - 07/01/18  0839      PT SHORT TERM GOAL #1   Title  Patient will be independent in home exercise program to improve strength/mobility for better functional independence with ADLs.    Time  4    Period  Weeks    Status  New    Target Date  07/29/18      PT SHORT TERM GOAL #2   Title  Patient (> 65 years old) will complete five times sit to stand test in < 15 seconds indicating an increased LE strength and improved balance.    Time  4    Period  Weeks    Status  New    Target Date  07/29/18        PT Long Term Goals - 07/01/18 0841      PT LONG TERM GOAL #1   Title  Patient will reduce timed up and go to <11 seconds to reduce fall risk and demonstrate improved transfer/gait ability.    Time  8    Period  Weeks    Status  New    Target Date  08/26/18      PT LONG TERM GOAL #2   Title  Patient will report a worst pain of 3/10 on VAS in B legs  to improve tolerance with ADLs and reduced symptoms with activities.     Baseline  8/10 B legs    Time  8    Period  Weeks    Status   New    Target Date  08/26/18      PT LONG TERM GOAL #3   Title  Patient will increase lower extremity functional scale to >60/80 to demonstrate improved functional mobility and increased tolerance with ADLs.     Time  8    Period  Weeks    Status  New    Target Date  08/26/18            Plan - 07/02/18 1503    Clinical Impression Statement  Patient is seen for manual therapy to B hips including long axis mobilization to B hips and lateral glide to right hip. She tolerates stretching to B hamstrings and calf musculature. She is instructed to continue with HEP of hamstring stretching. she will benefit from skilled PT to improve flexibility and decrease her pain.     Rehab Potential  Good    PT Frequency  2x / week    PT Duration  8 weeks    PT Treatment/Interventions  Therapeutic exercise;Therapeutic activities;Balance training;Patient/family education;Manual techniques;Dry needling    PT Next Visit Plan  manual therpay, stretching    Consulted and Agree with Plan of Care  Patient       Patient will benefit from skilled therapeutic intervention in order to improve the following deficits and impairments:  Abnormal gait, Difficulty walking, Decreased activity tolerance, Decreased strength, Impaired flexibility, Pain  Visit Diagnosis: Difficulty in walking, not elsewhere classified  Pain of right lower extremity  Other abnormalities of gait and mobility     Problem List Patient Active Problem List   Diagnosis Date Noted  . Leg cramps 05/13/2018  . Loss of memory 09/22/2017  . Lower extremity pain, bilateral 09/22/2017  . Parkinson's disease ([place]) 08/19/2017  . Prediabetes 07/26/2016  . Pure hypercholesterolemia 12/23/2014  . Anxiety 06/24/2014    Alanson Puls, PT DPT 07/02/2018, 3:09 PM  [place] MAIN [facility] SERVICES [address] [place], [place], [address] Phone:  [phone]   Fax:  [phone]  Name: Deborah Velazquez MRN: [id number] Date of Birth: 20-Aug-1946

## 2018-07-07 ENCOUNTER — Ambulatory Visit: Payer: PPO | Admitting: Physical Therapy

## 2018-07-07 ENCOUNTER — Encounter: Payer: Self-pay | Admitting: Physical Therapy

## 2018-07-07 DIAGNOSIS — R2689 Other abnormalities of gait and mobility: Secondary | ICD-10-CM

## 2018-07-07 DIAGNOSIS — M79604 Pain in right leg: Secondary | ICD-10-CM

## 2018-07-07 DIAGNOSIS — R262 Difficulty in walking, not elsewhere classified: Secondary | ICD-10-CM | POA: Diagnosis not present

## 2018-07-07 NOTE — Therapy (Signed)
[place] MAIN [facility] SERVICES [address] [place], [place], [address] Phone: [phone]   Fax:  [phone]  Physical Therapy Treatment  Patient Details  Name: Deborah Velazquez MRN: [id number] Date of Birth: October 13, 1946 Referring Provider: [name]   Encounter Date: 07/07/2018  PT End of Session - 07/07/18 0948    Visit Number  3    Number of Visits  17    Date for PT Re-Evaluation  08/25/18    Authorization Type  3/10    PT Start Time  0935    PT Stop Time  1015    PT Time Calculation (min)  40 min    Equipment Utilized During Treatment  Gait belt    Activity Tolerance  Patient tolerated treatment well    Behavior During Therapy  [facility] for tasks assessed/performed       Past Medical History:  Diagnosis Date  . Anxiety   . Hyperlipemia   . Irritable bowel syndrome   . Parkinson disease ([place])   . Restless leg syndrome   . RSD (reflex sympathetic dystrophy)    pt denies    Past Surgical History:  Procedure Laterality Date  . APPENDECTOMY    . BREAST BIOPSY Left 08/16/2013   neg  . CATARACT EXTRACTION Left   . COLONOSCOPY    . COLONOSCOPY WITH PROPOFOL N/A 06/20/2015   Procedure: COLONOSCOPY WITH PROPOFOL;  Surgeon: Lollie Sails, MD;  Location: [facility] ENDOSCOPY;  Service: Endoscopy;  Laterality: N/A;  . FRACTURE SURGERY     wrist - right  . PARS PLANA VITRECTOMY Left 2010    There were no vitals filed for this visit.  Subjective Assessment - 07/07/18 0940    Subjective  Patient reports that  she  wakes up with 6/10 in BLE and then it gets better as she moves around, then about 7-8 at night the pain gets worse and worse. After therapy she got worse and went to  bed after 10 PM  and had 10/10 pain BLE . She was able to decrease her pain by lying down and then on friday she woke up wiht the same pattern of pain that begins around 6/10 then decreases during the day and then increases to  6 or 7/10.      Pertinent History   Patient is reporting B leg pain in achilles tendon for the last few years. She was told to drink more water and it did help some. Her pain has gotten worse and worse over the years. Sometimes the pain is from the buttocks to the back of the knee and it also can be from the back of the kneee to the heel. It is intermittent pain and is severe 8/10. It hurts so much that she has to stop.She has onset of leg pain during standing and walking . She recently found out that she has parkonsons diseaese.     Currently in Pain?  Yes    Pain Score  6     Pain Location  Leg    Pain Orientation  Right;Left    Pain Descriptors / Indicators  Aching    Pain Type  Acute pain    Pain Onset  More than a month ago       Treatment: Knee to chest stretch x 30 sec x 3 sets BLE  Seated hamstring stretch x 30 sec x 3 sets  BLE  Standing hamstring stretch x 30 sec x 3 sets BLE  Standing calf stretch gastroc and soleus x 30 sec x 3 sets BLE  Standing trunk extension x 10  Instructed patient on not leaning fwd to stretch her back but to lean backwards and to use a towel roll for lumbar support with sitting.    Patient has cramps in left thigh during stretching and says that it feels like fire going through her feet.                      PT Education - 07/07/18 0947    Education Details  HEP    Person(s) Educated  Patient    Methods  Explanation    Comprehension  Verbalized understanding       PT Short Term Goals - 07/01/18 0839      PT SHORT TERM GOAL #1   Title  Patient will be independent in home exercise program to improve strength/mobility for better functional independence with ADLs.    Time  4    Period  Weeks    Status  New    Target Date  07/29/18      PT SHORT TERM GOAL #2   Title  Patient (> 54 years old) will complete five times sit to stand test in < 15 seconds indicating an increased LE strength and improved balance.    Time  4    Period  Weeks    Status  New     Target Date  07/29/18        PT Long Term Goals - 07/01/18 0841      PT LONG TERM GOAL #1   Title  Patient will reduce timed up and go to <11 seconds to reduce fall risk and demonstrate improved transfer/gait ability.    Time  8    Period  Weeks    Status  New    Target Date  08/26/18      PT LONG TERM GOAL #2   Title  Patient will report a worst pain of 3/10 on VAS in B legs  to improve tolerance with ADLs and reduced symptoms with activities.     Baseline  8/10 B legs    Time  8    Period  Weeks    Status  New    Target Date  08/26/18      PT LONG TERM GOAL #3   Title  Patient will increase lower extremity functional scale to >60/80 to demonstrate improved functional mobility and increased tolerance with ADLs.     Time  8    Period  Weeks    Status  New    Target Date  08/26/18            Plan - 07/07/18 0950    Clinical Impression Statement  Patient continues to have severe pain in BLE legs with no known origin and Dx of leg weakness and leg pain. She is fustrated with having pain that is getting worse over the last 4 years.  She was seen for stretching and manual therpay to BLE last visit which did increase her pain level that evening and night. Today she was seen for stretching BLE of hamstring, calfs, low back. She tolerates stretching and will continue stretching for HEP. She will continue to benefit from skilled PT to improve mobility and decrease her pain.     Rehab Potential  Good    PT Frequency  2x / week    PT Duration  8 weeks  PT Treatment/Interventions  Therapeutic exercise;Therapeutic activities;Balance training;Patient/family education;Manual techniques;Dry needling    PT Next Visit Plan  manual therpay, stretching    Consulted and Agree with Plan of Care  Patient       Patient will benefit from skilled therapeutic intervention in order to improve the following deficits and impairments:  Abnormal gait, Difficulty walking, Decreased activity  tolerance, Decreased strength, Impaired flexibility, Pain  Visit Diagnosis: Difficulty in walking, not elsewhere classified  Pain of right lower extremity  Other abnormalities of gait and mobility     Problem List Patient Active Problem List   Diagnosis Date Noted  . Leg cramps 05/13/2018  . Loss of memory 09/22/2017  . Lower extremity pain, bilateral 09/22/2017  . Parkinson's disease ([place]) 08/19/2017  . Prediabetes 07/26/2016  . Pure hypercholesterolemia 12/23/2014  . Anxiety 06/24/2014    Alanson Puls , PT DPT 07/07/2018, 10:26 AM  [place] MAIN [facility] SERVICES [address] [place], [place], [address] Phone: [phone]   Fax:  [phone]  Name: Deborah Velazquez MRN: [id number] Date of Birth: 11/30/1945

## 2018-07-07 NOTE — Patient Instructions (Addendum)
Soleus Stretch    Lean into wall with sound leg slightly bent until a stretch is felt in lower calf. Keep heel on floor. Hold _30___ seconds. Repeat ___3_ times. Do _2___ sessions per day.  Copyright  VHI. All rights reserved.  Soleus Stretch    Copyright  VHI. All rights reserved.  Gastroc Stretch    With sound leg straight, foot pointing to wall, lean into wall until a stretch is felt in the calf. Keep heel on floor. Hold _30___ seconds. Repeat _3___ times. Do _2___ sessions per day.  Copyright  VHI. All rights reserved.  Achilles Tendon Stretch  Hamstring Stretch (Standing)    Standing, place one heel on chair or bench. Use one or both hands on thigh for support. Keeping torso straight, lean forward slowly until a stretch is felt in back of same thigh. Hold _30___ seconds. Repeat with other leg.3 repetitions and 2 x / dayKnee-to-Chest Stretch: Unilateral    With hand behind right knee, pull knee in to chest until a comfortable stretch is felt in lower back and buttocks. Keep back relaxed. Hold _30__ seconds. Repeat _3___ times per set. Do __1__ sets per session. Do __2__ sessions per day.  http://orth.exer.us/127   Copyright  VHI. All rights reserved.    Copyright  VHI. All rights reserved.    Copyright  VHI. All rights reserved.  Extension: Lumbar - Standing (Belt)    Stand with your back against the counter top and bring legs close to counter, and lean back x 10  Repeat every hour  When awake. .Copyright  VHI. All rights reserved.

## 2018-07-08 ENCOUNTER — Telehealth: Payer: Self-pay | Admitting: *Deleted

## 2018-07-08 DIAGNOSIS — R7303 Prediabetes: Secondary | ICD-10-CM

## 2018-07-08 DIAGNOSIS — E7800 Pure hypercholesterolemia, unspecified: Secondary | ICD-10-CM

## 2018-07-08 NOTE — Telephone Encounter (Signed)
Fasting labs ordered

## 2018-07-08 NOTE — Telephone Encounter (Signed)
Please place future orders for lab appt on 07/09/18. 

## 2018-07-09 ENCOUNTER — Other Ambulatory Visit (INDEPENDENT_AMBULATORY_CARE_PROVIDER_SITE_OTHER): Payer: PPO

## 2018-07-09 ENCOUNTER — Other Ambulatory Visit: Payer: Self-pay

## 2018-07-09 ENCOUNTER — Ambulatory Visit: Payer: PPO | Admitting: Physical Therapy

## 2018-07-09 DIAGNOSIS — E7800 Pure hypercholesterolemia, unspecified: Secondary | ICD-10-CM | POA: Diagnosis not present

## 2018-07-09 DIAGNOSIS — R7303 Prediabetes: Secondary | ICD-10-CM | POA: Diagnosis not present

## 2018-07-09 LAB — LIPID PANEL
CHOL/HDL RATIO: 2
Cholesterol: 183 mg/dL (ref 0–200)
HDL: 75.6 mg/dL (ref >39.00)
LDL CALC: 81 mg/dL (ref 0–99)
NonHDL: 106.96
TRIGLYCERIDES: 132 mg/dL (ref 0.0–149.0)
VLDL: 26.4 mg/dL (ref 0.0–40.0)

## 2018-07-09 LAB — HEMOGLOBIN A1C: Hgb A1c MFr Bld: 6.4 % (ref 4.6–6.5)

## 2018-07-09 LAB — COMPREHENSIVE METABOLIC PANEL
ALBUMIN: 4.7 g/dL (ref 3.5–5.2)
ALK PHOS: 60 U/L (ref 39–117)
ALT: 2 U/L (ref 0–35)
AST: 16 U/L (ref 0–37)
BILIRUBIN TOTAL: 0.6 mg/dL (ref 0.2–1.2)
BUN: 26 mg/dL — AB (ref 6–23)
CO2: 30 mEq/L (ref 19–32)
Calcium: 9.9 mg/dL (ref 8.4–10.5)
Chloride: 100 mEq/L (ref 96–112)
Creatinine, Ser: 0.87 mg/dL (ref 0.40–1.20)
GFR: 67.65 mL/min (ref >60.00)
GLUCOSE: 122 mg/dL — AB (ref 70–99)
Potassium: 4.2 mEq/L (ref 3.5–5.1)
Sodium: 137 mEq/L (ref 135–145)
Total Protein: 7.5 g/dL (ref 6.0–8.3)

## 2018-07-09 NOTE — Patient Outreach (Signed)
[facility]) Care Management  07/09/2018  Deborah Velazquez 09-18-1946 [id number]  Telephone assessment:   Telephone call to patient regarding assessment follow up. HIPAA verified with patient. Patient reports her leg pain is not any better.  Patient states she does not have any energy.  Patient reports she saw her primary MD last month. Patient states her doctor said she was not exercising enough.  Patient states she still has therapy 2 times per week. Patient states she does not have the energy to exercise.  She states she only feels like laying around.  Patient states she thinks the medication she is taking is contributing to her having less energy.  Patient states she is on gabapentin 3 times per day and at bedtime, and she takes flexeril as needed. Patient reports she is scheduled to see her primary MD in approximately 1 week. She states she is going to ask her doctor about having an MRI because she thinks the leg pain could be contributed to back problems. Patient states when she is laying down she does not have the leg pain but when up walking her leg pain can range between a 8-9.  Patient reports her psychiatrist decrease her zoloft to 1/2 tablet daily.  Patient states the constipation has gotten a little better. She reports she continues to take colace.   Patient states she sees the doctor regarding her parkinson's disease in 2 weeks.  Patient states she does not like the fact that she only sees this doctor every 3 months. Rncm advised patient if she is having concerns/ or symptoms prior to her next appointments she should call the office for earlier appointment. Patient verbalized understanding.   PLAN; RNCM will follow up with patient within 2 weeks.   Quinn Plowman RN,BSN,CCM [facility] Telephonic  [phone]

## 2018-07-10 ENCOUNTER — Ambulatory Visit: Payer: PPO | Admitting: Physical Therapy

## 2018-07-13 ENCOUNTER — Encounter: Payer: PPO | Admitting: Physical Therapy

## 2018-07-13 ENCOUNTER — Encounter: Payer: Self-pay | Admitting: Internal Medicine

## 2018-07-13 ENCOUNTER — Ambulatory Visit (INDEPENDENT_AMBULATORY_CARE_PROVIDER_SITE_OTHER): Payer: PPO

## 2018-07-13 ENCOUNTER — Ambulatory Visit (INDEPENDENT_AMBULATORY_CARE_PROVIDER_SITE_OTHER): Payer: PPO | Admitting: Internal Medicine

## 2018-07-13 VITALS — BP 110/60 | HR 77 | Temp 98.1°F | Ht 66.0 in | Wt 140.8 lb

## 2018-07-13 DIAGNOSIS — M47816 Spondylosis without myelopathy or radiculopathy, lumbar region: Secondary | ICD-10-CM | POA: Diagnosis not present

## 2018-07-13 DIAGNOSIS — M79604 Pain in right leg: Secondary | ICD-10-CM

## 2018-07-13 DIAGNOSIS — R252 Cramp and spasm: Secondary | ICD-10-CM | POA: Diagnosis not present

## 2018-07-13 DIAGNOSIS — M5432 Sciatica, left side: Secondary | ICD-10-CM | POA: Diagnosis not present

## 2018-07-13 DIAGNOSIS — M79605 Pain in left leg: Secondary | ICD-10-CM

## 2018-07-13 DIAGNOSIS — G629 Polyneuropathy, unspecified: Secondary | ICD-10-CM

## 2018-07-13 DIAGNOSIS — E1140 Type 2 diabetes mellitus with diabetic neuropathy, unspecified: Secondary | ICD-10-CM | POA: Diagnosis not present

## 2018-07-13 DIAGNOSIS — M5431 Sciatica, right side: Secondary | ICD-10-CM

## 2018-07-13 DIAGNOSIS — R7303 Prediabetes: Secondary | ICD-10-CM

## 2018-07-13 MED ORDER — PREGABALIN 75 MG PO CAPS: 75.0000 mg | ORAL_CAPSULE | Freq: Two times a day (BID) | ORAL | 1 refills | Status: DC

## 2018-07-13 NOTE — Assessment & Plan Note (Signed)
Persistent.  Now with concurrent low back pain.  DDD with anterolisthesis of l4 on L5 on plain films , raising  the possibility of y referred pain from spinal or foraminal stenosis .  MRI lumbar spine and EMG/nerve conduction studies recommended.

## 2018-07-13 NOTE — Assessment & Plan Note (Signed)
etiolog y includes sensory neuropathy, muscle weakness from atrophy , and referred pain from spinal stenosis . Plain films of lumbar spine suggest DDD in the L4-L5 area may have resulted in

## 2018-07-13 NOTE — Progress Notes (Signed)
Pre visit review using our clinic review tool, if applicable. No additional management support is needed unless otherwise documented below in the visit note. 

## 2018-07-13 NOTE — Patient Instructions (Signed)
I am changing your gabapentin to Lyrica for nerve pain.  YOU ONLY HAVE TO TAKE IT TWO TIMES DAILY   Continue PT   I am ordering EMG/nerve conduction studies along with the lumbar x rays today

## 2018-07-13 NOTE — Progress Notes (Signed)
Subjective:  Patient ID: Deborah Velazquez, female    DOB: 11/06/1946  Age: 72 y.o. MRN: [id number]  CC: The primary encounter diagnosis was Bilateral sciatica. Diagnoses of Type 2 diabetes mellitus with diabetic neuropathy, without long-term current use of insulin ([place]), Lower extremity pain, bilateral, Leg cramps, Neuropathy, and Prediabetes were also pertinent to this visit.  HPI Deborah Velazquez presents for  1 month follow up on leg pain , prediabetes and other issues .  Patient has no new  complaints today.  Patient is following a low glycemic index diet .   Patient is attending  physical therapy for leg weakness but has been advised to return for further evaluation of possible spinal cord issues.  She is Not walking daily because her muscles "hurt so bad."   She continues to be limited by bilateral calf pain and recurrent muscle spasms along with posterioir thigh pain.  Brought on by walking and standing      She has had  neuropathic pain in her feet that has progressed .  She is taking  gabapetin  three times daily  Makes her sleepy.    Outpatient Medications Prior to Visit  Medication Sig Dispense Refill  . atorvastatin (LIPITOR) 10 MG tablet Take 1 tablet (10 mg total) by mouth at bedtime. 90 tablet 1  . carbidopa-levodopa (SINEMET CR) 50-200 MG tablet Take 1 tablet by mouth at bedtime.    . carbidopa-levodopa (SINEMET IR) 25-100 MG tablet 2 in the morning, 1 in the afternoon, 1 in the evening (Patient taking differently: 1-2 tablets. 2 in the morning, 1 in the noon, 1 in the 1600) 360 tablet 1  . citalopram (CELEXA) 40 MG tablet Take 40 mg by mouth daily.    Marland Kitchen co-enzyme Q-10 30 MG capsule Take 100 mg by mouth daily.    . cyclobenzaprine (FLEXERIL) 5 MG tablet Take 5 mg by mouth 3 (three) times daily as needed for muscle spasms.    Marland Kitchen docusate sodium (COLACE) 100 MG capsule Take 2 capsules (200 mg total) by mouth at bedtime. 60 capsule 5  . gabapentin (NEURONTIN) 100 MG capsule 1 capsule  at breakfast lunch and dinner ,  2 at bedtime 150 capsule 2  . meloxicam (MOBIC) 15 MG tablet TK 1 T PO QD PRN FOR MUSCLE SPAMS  2  . Omega-3 Fatty Acids (FISH OIL) 1000 MG CAPS Take by mouth.    . sertraline (ZOLOFT) 50 MG tablet     . vitamin B-12 (CYANOCOBALAMIN) 250 MCG tablet Take 250 mcg by mouth daily.    . sertraline (ZOLOFT) 100 MG tablet Take 2 tablets (200 mg total) by mouth daily. (Patient taking differently: Take 100 mg by mouth daily. ) 60 tablet 5   No facility-administered medications prior to visit.     Review of Systems;  Patient denies headache, fevers, malaise, unintentional weight loss, skin rash, eye pain, sinus congestion and sinus pain, sore throat, dysphagia,  hemoptysis , cough, dyspnea, wheezing, chest pain, palpitations, orthopnea, edema, abdominal pain, nausea, melena, diarrhea, constipation, flank pain, dysuria, hematuria, urinary  Frequency, nocturia, numbness, tingling, seizures,  Focal weakness, Loss of consciousness,  Tremor, insomnia, depression, anxiety, and suicidal ideation.      Objective:  BP 110/60   Pulse 77   Temp 98.1 F (36.7 C) (Oral)   Ht 5\' 6"  (1.676 m)   Wt 140 lb 12.8 oz (63.9 kg)   SpO2 96%   BMI 22.73 kg/m   BP Readings from Last  3 Encounters:  07/13/18 110/60  06/11/18 102/70  05/12/18 112/76    Wt Readings from Last 3 Encounters:  07/13/18 140 lb 12.8 oz (63.9 kg)  06/11/18 142 lb 3.2 oz (64.5 kg)  05/12/18 143 lb 6.4 oz (65 kg)    General appearance: alert, cooperative and appears stated age Back: symmetric, no curvature. ROM normal. No CVA tenderness. Lungs: clear to auscultation bilaterally Heart: regular rate and rhythm, S1, S2 normal, no murmur, click, rub or gallop Abdomen: soft, non-tender; bowel sounds normal; no masses,  no organomegaly Pulses: 2+ and symmetric Skin: Skin color, texture, turgor normal. No rashes or lesions Lymph nodes: Cervical, supraclavicular, and axillary nodes normal. MSK: positive  straight leg lift on the right.  Hip joints with full ROM Neuro:  3+/4 patellar DTRs,  Absent achilles  Reflexes bilaterally   Lab Results  Component Value Date   HGBA1C 6.4 07/09/2018   HGBA1C 6.3 04/02/2018    Lab Results  Component Value Date   CREATININE 0.87 07/09/2018    Lab Results  Component Value Date   GLUCOSE 122 (H) 07/09/2018   CHOL 183 07/09/2018   TRIG 132.0 07/09/2018   HDL 75.60 07/09/2018   LDLCALC 81 07/09/2018   ALT 2 07/09/2018   AST 16 07/09/2018   NA 137 07/09/2018   K 4.2 07/09/2018   CL 100 07/09/2018   CREATININE 0.87 07/09/2018   BUN 26 (H) 07/09/2018   CO2 30 07/09/2018   TSH 2.15 04/02/2018   HGBA1C 6.4 07/09/2018     Assessment & Plan:   Problem List Items Addressed This Visit    Prediabetes    I have addressed  A1c and recommended adhering to a  a low glycemic index diet and regular exercise a minimum of 5 days per week.    Lab Results  Component Value Date   HGBA1C 6.4 07/09/2018         Lower extremity pain, bilateral    etiolog y includes sensory neuropathy, muscle weakness from atrophy , and referred pain from spinal stenosis . Plain films of lumbar spine suggest DDD in the L4-L5 area may have resulted in       Leg cramps    Persistent.  Now with concurrent low back pain.  DDD with anterolisthesis of l4 on L5 on plain films , raising  the possibility of y referred pain from spinal or foraminal stenosis .  MRI lumbar spine and EMG/nerve conduction studies recommended.       Neuropathy    Not controlled with doss of gabapentin that are causing exciessive sedation.  Trial of lyrica.   EMG /nerve conduction studies        Other Visit Diagnoses    Bilateral sciatica    -  Primary   Relevant Medications   sertraline (ZOLOFT) 50 MG tablet   pregabalin (LYRICA) 75 MG capsule   Other Relevant Orders   DG Lumbar Spine Complete (Completed)   NCV with EMG(electromyography)   Type 2 diabetes mellitus with diabetic neuropathy,  without long-term current use of insulin (HCC)       Relevant Orders   NCV with EMG(electromyography)      I am having Deborah Velazquez start on pregabalin. I am also having her maintain her Fish Oil, co-enzyme Q-10, vitamin B-12, carbidopa-levodopa, carbidopa-levodopa, atorvastatin, meloxicam, gabapentin, citalopram, docusate sodium, cyclobenzaprine, and sertraline.  Meds ordered this encounter  Medications  . pregabalin (LYRICA) 75 MG capsule    Sig: Take 1 capsule (75  mg total) by mouth 2 (two) times daily.    Dispense:  60 capsule    Refill:  1    Medications Discontinued During This Encounter  Medication Reason  . sertraline (ZOLOFT) 100 MG tablet Patient has not taken in last 30 days    Follow-up: Return in about 3 months (around 10/13/2018) for follow up diabetes.   [name], MD

## 2018-07-13 NOTE — Assessment & Plan Note (Signed)
Not controlled with doss of gabapentin that are causing exciessive sedation.  Trial of lyrica.   EMG /nerve conduction studies

## 2018-07-13 NOTE — Assessment & Plan Note (Signed)
I have addressed  A1c and recommended adhering to a  a low glycemic index diet and regular exercise a minimum of 5 days per week.    Lab Results  Component Value Date   HGBA1C 6.4 07/09/2018

## 2018-07-14 ENCOUNTER — Telehealth: Payer: Self-pay

## 2018-07-14 ENCOUNTER — Ambulatory Visit: Payer: PPO | Admitting: Physical Therapy

## 2018-07-14 NOTE — Telephone Encounter (Signed)
Copied from [place] [id number]. Topic: General - Other >> Jul 14, 2018  2:54 PM Yvette Rack wrote: Reason for CRM: Pt returned call to office. Pt requests call back.

## 2018-07-16 ENCOUNTER — Encounter: Payer: PPO | Admitting: Physical Therapy

## 2018-07-16 ENCOUNTER — Ambulatory Visit: Payer: PPO | Admitting: Physical Therapy

## 2018-07-17 ENCOUNTER — Telehealth: Payer: Self-pay

## 2018-07-17 DIAGNOSIS — M4807 Spinal stenosis, lumbosacral region: Secondary | ICD-10-CM

## 2018-07-17 DIAGNOSIS — M5136 Other intervertebral disc degeneration, lumbar region: Secondary | ICD-10-CM

## 2018-07-17 NOTE — Telephone Encounter (Signed)
Copied from [place] [phone]. Topic: Referral - Question >> Jul 17, 2018 11:14 AM Deborah Velazquez, NT wrote: Patient called and states she was suppose to get an MRI done. She has not heard anything in reference to scheduling this. Please call  CB# [phone]

## 2018-07-20 ENCOUNTER — Other Ambulatory Visit: Payer: Self-pay | Admitting: Internal Medicine

## 2018-07-20 NOTE — Telephone Encounter (Signed)
Patient was supposed to e mail me back about whether she was willing to get an MRI .  I never hear back from her,  Just  Text message from  her husband who did not specify that she wanted an MRI, .  Thought he was just checking on the  Nerve conduction studies that  were  Ordered at the time of the visit.  !.  I will order the MRI now.

## 2018-07-20 NOTE — Addendum Note (Signed)
Addended by: [name] on: 07/20/2018 09:08 AM   Modules accepted: Orders

## 2018-07-20 NOTE — Telephone Encounter (Signed)
Dr. Derrel Nip, patient is calling regarding a MRI that she needs to schedule. No order for MRI.  Thanks [employer]

## 2018-07-21 ENCOUNTER — Other Ambulatory Visit: Payer: Self-pay | Admitting: Neurology

## 2018-07-21 ENCOUNTER — Ambulatory Visit: Payer: PPO | Admitting: Physical Therapy

## 2018-07-21 MED ORDER — CARBIDOPA-LEVODOPA ER 50-200 MG PO TBCR: 1.0000 | EXTENDED_RELEASE_TABLET | Freq: Every day | ORAL | 0 refills | Status: DC

## 2018-07-21 NOTE — Telephone Encounter (Signed)
No call placed to patient on 07/14/18

## 2018-07-22 NOTE — Progress Notes (Deleted)
Deborah Velazquez was seen today in the movement disorders clinic for neurologic consultation at the request of [name], MD.  The consultation is for the evaluation of tremor and to r/o PD.  Pt previously seen Dr. [name].  The records that were made available to me were reviewed.  This patient is accompanied in the office by her spouse who supplements the history.   Patient first saw Dr. [name] on August 19, 2017.  She was diagnosed with Parkinson's disease at that visit and started on carbidopa/levodopa 25/100, 1 tablet 3 times per day.  She followed up in October, 2018 and stated that the medication definitely helped, but complained of fatigue.  She denies today that it was of benefit.  He recommended that she change to carbidopa/levodopa 25/100 CR, 1 tablet twice per day (8am/8pm). Her husband states that this also "turned her into a zombie."   He also recommended donepezil.  She states that she was not prescribed that and she doesn't know what that was for.  On pramipexole 1 mg for RLS x 10-20 years per patient.  03/11/18 update: Patient is seen in follow-up for Parkinson's disease.  Records have been reviewed since our last visit.  We decided to hold her daytime levodopa after last visit as the patient thought it was causing memory change (I was not convinced), although she still takes carbidopa/levodopa 50/200 at bedtime for restless leg.  She was weaned off pramipexole for restless leg. she saw Dr. [name] for neuropscyhometric testing on December 30, 2017 and subsequently had a feedback session with her where results and recommendations were given to her.  These are detailed within the chart.  In brief, this demonstrated mild cognitive impairment, but no evidence of dementia.  She called here on January 09, 2018 stating that she was having anxiety, diarrhea, cramping and "felt like a zombie."  We explained to her that she was likely very underdosed as she had refused medication.  Daytime  levodopa was offered at proper dosages, but she stated that she wanted to address anxiety with her primary care physician first.  She does report that she is back on levodopa now and started that on 3/15 but didn't get up to the dosage 25/100 IR tid until 5/11 (8am/4pm/11pm).   She did see her PCP and was given as needed Xanax and increased her Zoloft to 150 mg daily.  Primary care records are reviewed.  Her primary care physician did tell her that he really did not want her to use scheduled Xanax and would prefer sertraline and buspar.  She saw her primary care physician on January 23, 2018 and it was recommended that she discontinue her levodopa as she told him she felt much worse on the medication.  She called her primary care physician on January 29, 2018 stating that she was doing worse and she was placed on gabapentin for restless leg.  She called her primary care physician back on February 03, 2018 due to anxiety and psychiatry was recommended.  She didn't go see psychiatry.  Anxiety continues to be a big issue.  She states that she is on hydroxizine and PCP took her off of xanax.  Husband finds that when she is busy she does much better.  She has poor motivation to be busy though.  She walked some yesterday (20 min).  One fall when carrying groceries but didn't get hurt.    07/23/18 update: Patient seen today in follow-up for Parkinson's disease.  She  is accompanied by her husband who supplements the history.  She is on carbidopa/levodopa 25/100, 2 tablets in the morning, 1 in the afternoon and 1 in the evening and last visit we added carbidopa/levodopa 50/200 at bedtime.  Records are reviewed since our last visit.  She has been to her primary care physician several times for muscle aching and pain and back pain.  MRI was ordered of the lumbar spine and is scheduled for September 8.  EMG was ordered and is scheduled for this afternoon   ALLERGIES:   Allergies  Allergen Reactions  . Pseudoephedrine Hcl Other  (See Comments)    Other Reaction: nervous  . Sudafed [Pseudoephedrine]     CURRENT MEDICATIONS:  Outpatient Encounter Medications as of 07/23/2018  Medication Sig  . atorvastatin (LIPITOR) 10 MG tablet Take 1 tablet (10 mg total) by mouth at bedtime.  . carbidopa-levodopa (SINEMET CR) 50-200 MG tablet Take 1 tablet by mouth at bedtime.  . carbidopa-levodopa (SINEMET IR) 25-100 MG tablet 2 in the morning, 1 in the afternoon, 1 in the evening (Patient taking differently: 1-2 tablets. 2 in the morning, 1 in the noon, 1 in the 1600)  . citalopram (CELEXA) 40 MG tablet Take 40 mg by mouth daily.  Marland Kitchen co-enzyme Q-10 30 MG capsule Take 100 mg by mouth daily.  . cyclobenzaprine (FLEXERIL) 5 MG tablet Take 5 mg by mouth 3 (three) times daily as needed for muscle spasms.  Marland Kitchen docusate sodium (COLACE) 100 MG capsule Take 2 capsules (200 mg total) by mouth at bedtime.  . gabapentin (NEURONTIN) 100 MG capsule 1 capsule at breakfast lunch and dinner ,  2 at bedtime  . meloxicam (MOBIC) 15 MG tablet TK 1 T PO QD PRN FOR MUSCLE SPAMS  . Omega-3 Fatty Acids (FISH OIL) 1000 MG CAPS Take by mouth.  . pregabalin (LYRICA) 75 MG capsule Take 1 capsule (75 mg total) by mouth 2 (two) times daily.  . sertraline (ZOLOFT) 50 MG tablet   . vitamin B-12 (CYANOCOBALAMIN) 250 MCG tablet Take 250 mcg by mouth daily.   No facility-administered encounter medications on file as of 07/23/2018.     PAST MEDICAL HISTORY:   Past Medical History:  Diagnosis Date  . Anxiety   . Hyperlipemia   . Irritable bowel syndrome   . Parkinson disease ([place])   . Restless leg syndrome   . RSD (reflex sympathetic dystrophy)    pt denies    PAST SURGICAL HISTORY:   Past Surgical History:  Procedure Laterality Date  . APPENDECTOMY    . BREAST BIOPSY Left 08/16/2013   neg  . CATARACT EXTRACTION Left   . COLONOSCOPY    . COLONOSCOPY WITH PROPOFOL N/A 06/20/2015   Procedure: COLONOSCOPY WITH PROPOFOL;  Surgeon: Lollie Sails, MD;   [facility];  Service: Endoscopy;  Laterality: N/A;  . FRACTURE SURGERY     wrist - right  . PARS PLANA VITRECTOMY Left 2010    SOCIAL HISTORY:   Social History   Socioeconomic History  . Marital status: Married    Spouse name: Not on file  . Number of children: Not on file  . Years of education: Not on file  . Highest education level: Not on file  Occupational History  . Occupation: retired    Comment: Geographical information systems officer  . Financial resource strain: Not on file  . Food insecurity:    Worry: Not on file    Inability: Not on file  . Transportation  needs:    Medical: Not on file    Non-medical: Not on file  Tobacco Use  . Smoking status: Never Smoker  . Smokeless tobacco: Never Used  Substance and Sexual Activity  . Alcohol use: Yes    Alcohol/week: 7.0 standard drinks    Types: 7 Glasses of wine per week    Comment: 1-2 x a week  . Drug use: No  . Sexual activity: Not Currently  Lifestyle  . Physical activity:    Days per week: Not on file    Minutes per session: Not on file  . Stress: Not on file  Relationships  . Social connections:    Talks on phone: Not on file    Gets together: Not on file    Attends religious service: Not on file    Active member of club or organization: Not on file    Attends meetings of clubs or organizations: Not on file    Relationship status: Not on file  . Intimate partner violence:    Fear of current or ex partner: Not on file    Emotionally abused: Not on file    Physically abused: Not on file    Forced sexual activity: Not on file  Other Topics Concern  . Not on file  Social History Narrative  . Not on file    FAMILY HISTORY:   Family Status  Relation Name Status  . Mother  Deceased  . Father  Deceased  . Mat Aunt  (Not Specified)    ROS:  R foot burning.  A complete 10 system review of systems was obtained and was unremarkable apart from what is mentioned above.  PHYSICAL EXAMINATION:    VITALS:     There were no vitals filed for this visit.  GEN:  The patient appears stated age and is in NAD. HEENT:  Normocephalic, atraumatic.  The mucous membranes are moist. The superficial temporal arteries are without ropiness or tenderness. CV:  RRR Lungs:  CTAB Neck/HEME:  There are no carotid bruits bilaterally.  Neurological examination:  Orientation: alert and oriented x 3 Montreal Cognitive Assessment  12/16/2017  Visuospatial/ Executive (0/5) 3  Naming (0/3) 2  Attention: Read list of digits (0/2) 2  Attention: Read list of letters (0/1) 1  Attention: Serial 7 subtraction starting at 100 (0/3) 1  Language: Repeat phrase (0/2) 2  Language : Fluency (0/1) 1  Abstraction (0/2) 1  Delayed Recall (0/5) 4  Orientation (0/6) 6  Total 23  Adjusted Score (based on education) 23   Cranial nerves: There is good facial symmetry. There is facial hypomimia. Extraocular muscles are intact. The visual fields are full to confrontational testing. The speech is fluent and clear.   She is hypophonic.  Soft palate rises symmetrically and there is no tongue deviation. Hearing is intact to conversational tone. Sensation: Sensation is intact to light touch throughout Motor: Strength is 5/5 in the bilateral upper and lower extremities.   Shoulder shrug is equal and symmetric.  There is no pronator drift. Deep tendon reflexes: Deep tendon reflexes are 3/4 at the bilateral biceps, triceps, brachioradialis, patella and achilles. Plantar responses are downgoing bilaterally.  Movement examination: Tone: There is normal tone in the UE and mild to mod increased in the bilateral lower extremities Abnormal movements: none even with distraction Coordination:  There is mild decremation, with any form of RAMS, including alternating supination and pronation of the forearm, hand opening and closing, finger taps, heel taps and toe  taps. Gait and Station: When asked to get OOC without hands, she cannot do it (but effort is  questionable).  She does arise by pushing off of the chair.  She has decreased arm swing.     ASSESSMENT/PLAN:  1.  Idiopathic Parkinson's disease.    -We discussed the diagnosis as well as pathophysiology of the disease.  We discussed treatment options as well as prognostic indicators.  Patient education was provided.  -We discussed that it used to be thought that levodopa would increase risk of melanoma but now it is believed that Parkinsons itself likely increases risk of melanoma. she is to get regular skin checks.  -As previous, I was not and am not convinced that carbidopa/levodopa is causing memory change and, in fact, neurocognitive testing demonstrated no evidence of dementia. She was also off of it for over a month and she/husband both admit no memory change improvement when off.     -increase carbidopa/levodopa 25/100, 2/1/1 and move dosages closer together  -start Carbidopa/levodopa 50/200 at bed  -talked about exercise  -talked about regular schedule    2.  Memory change  -Neurocognitive testing in February, 2019 demonstrated no evidence of dementia, but only mild cognitive impairment.  3.  Anxiety  -This is very significant and getting in the way of treating her Parkinson's disease.  I agree with her primary care physician that psychiatry is recommended.  She and I talked about this at length.  She reports that she will make appt.    4.  ***   Cc:  [name], MD

## 2018-07-23 ENCOUNTER — Other Ambulatory Visit: Payer: Self-pay | Admitting: Internal Medicine

## 2018-07-23 ENCOUNTER — Ambulatory Visit (INDEPENDENT_AMBULATORY_CARE_PROVIDER_SITE_OTHER): Payer: PPO | Admitting: Neurology

## 2018-07-23 ENCOUNTER — Ambulatory Visit: Payer: Self-pay

## 2018-07-23 ENCOUNTER — Ambulatory Visit: Payer: PPO | Admitting: Neurology

## 2018-07-23 ENCOUNTER — Encounter: Payer: Self-pay | Admitting: Physical Therapy

## 2018-07-23 DIAGNOSIS — M5432 Sciatica, left side: Secondary | ICD-10-CM | POA: Diagnosis not present

## 2018-07-23 DIAGNOSIS — M5431 Sciatica, right side: Secondary | ICD-10-CM | POA: Diagnosis not present

## 2018-07-23 DIAGNOSIS — E1140 Type 2 diabetes mellitus with diabetic neuropathy, unspecified: Secondary | ICD-10-CM

## 2018-07-23 DIAGNOSIS — M5417 Radiculopathy, lumbosacral region: Secondary | ICD-10-CM

## 2018-07-23 NOTE — Procedures (Signed)
[facility] Neurology  [place], [place]  [place], [place] [address] Tel: [phone] Fax:  [phone] Test Date:  07/23/2018  Patient: Deborah Velazquez DOB: Oct 17, 1946 Physician: Narda Amber, DO  Sex: Female Height: 5\' 6"  Ref Phys: Deborra Medina, MD  ID#: [id number] Temp: 34.2C Technician:    Patient Complaints: This is a 72 year old female referred for evaluation of low back pain radiating into bilateral legs.   NCV & EMG Findings: Extensive electrodiagnostic testing of the right lower extremity and additional studies of the left shows:  1. Bilateral sural, superficial peroneal, and mixed and lateral plantar sensory responses are within normal limits. 2. Bilateral tibial and peroneal motor responses are within normal limits. Of note, there is evidence of an accessory peroneal nerve on the right, as noted by a motor response when stimulating at the medial malleolus. 3. Bilateral tibial H reflex studies are within normal limits. 4. Sparse chronic motor axon loss changes are seen affecting the S1 myotomes bilaterally, without accompanied active denervation.   Impression: 1. Chronic S1 radiculopathy affecting bilateral lower extremities, mild in degree electrically. 2. There is no evidence of a large fiber sensorimotor polyneuropathy affecting the lower extremities. 3. Incidentally, there is a right accessory peroneal nerve, normal anatomical variant.  ___________________________ Narda Amber, DO    Nerve Conduction Studies Anti Sensory Summary Table   Site NR Peak (ms) Norm Peak (ms) P-T Amp (V) Norm P-T Amp  Left Sup Peroneal Anti Sensory (Ant Lat Mall)  12 cm    2.6 <4.6 12.0 >3  Right Sup Peroneal Anti Sensory (Ant Lat Mall)  12 cm    2.2 <4.6 8.9 >3  Left Sural Anti Sensory (Lat Mall)  Calf    3.2 <4.6 9.9 >3  Right Sural Anti Sensory (Lat Mall)  Calf    2.8 <4.6 9.5 >3   Motor Summary Table   Site NR Onset (ms) Norm Onset (ms) O-P Amp (mV) Norm O-P Amp  Site1 Site2 Delta-0 (ms) Dist (cm) Vel (m/s) Norm Vel (m/s)  Left Peroneal Motor (Ext Dig Brev)  Ankle    3.2 <6.0 6.2 >2.5 B Fib Ankle 7.8 36.0 46 >40  B Fib    11.0  5.7  Poplt B Fib 1.1 7.0 64 >40  Poplt    12.1  5.5         Right Peroneal Motor (Ext Dig Brev)  Ankle    2.7 <6.0 5.3 >2.5 B Fib Ankle 7.5 36.0 48 >40  B Fib    10.2  4.0  Poplt B Fib 1.1 8.0 73 >40  Poplt    11.3  3.8         Medial malleolus    3.6  2.8         Left Tibial Motor (Abd Hall Brev)  Ankle    3.1 <6.0 6.2 >4 Knee Ankle 8.4 39.0 46 >40  Knee    11.5  4.7         Right Tibial Motor (Abd Hall Brev)  Ankle    4.1 <6.0 7.7 >4 Knee Ankle 7.4 39.0 53 >40  Knee    11.5  5.1          H Reflex Studies   NR H-Lat (ms) Lat Norm (ms) L-R H-Lat (ms)  Left Tibial (Gastroc)     34.83 <35 0.00  Right Tibial (Gastroc)     34.83 <35 0.00   EMG   Side Muscle Ins Act Fibs Psw Fasc Number Recrt  Dur Dur. Amp Amp. Poly Poly. Comment  Right AntTibialis Nml Nml Nml Nml Nml Nml Nml Nml Nml Nml Nml Nml N/A  Right Gastroc Nml Nml Nml Nml 1- Rapid Few 1+ Few 1+ Nml Nml N/A  Right Flex Dig Long Nml Nml Nml Nml Nml Nml Nml Nml Nml Nml Nml Nml N/A  Right RectFemoris Nml Nml Nml Nml Nml Nml Nml Nml Nml Nml Nml Nml N/A  Right GluteusMed Nml Nml Nml Nml Nml Nml Nml Nml Nml Nml Nml Nml N/A  Right BicepsFemS Nml Nml Nml Nml 1- Rapid Few 1+ Few 1+ Nml Nml N/A  Left Gastroc Nml Nml Nml Nml 1- Rapid Few 1+ Few 1+ Nml Nml N/A  Left Flex Dig Long Nml Nml Nml Nml Nml Nml Nml Nml Nml Nml Nml Nml N/A  Left RectFemoris Nml Nml Nml Nml Nml Nml Nml Nml Nml Nml Nml Nml N/A  Left GluteusMed Nml Nml Nml Nml Nml Nml Nml Nml Nml Nml Nml Nml N/A  Left BicepsFemS Nml Nml Nml Nml 1- Rapid Few 1+ Few 1+ Nml Nml N/A  Left AntTibialis Nml Nml Nml Nml Nml Nml Nml Nml Nml Nml Nml Nml N/A      Waveforms:

## 2018-07-28 ENCOUNTER — Ambulatory Visit: Payer: PPO | Admitting: Physical Therapy

## 2018-07-29 ENCOUNTER — Other Ambulatory Visit: Payer: Self-pay

## 2018-07-29 NOTE — Patient Outreach (Signed)
[facility]) Care Management  07/29/2018  Deborah Velazquez 11/17/1946 [id number]  Telephone assessment:  Telephone call to patient for assessment follow up. HIPAA verified. Patient reports she has had a xray of her back which showed " problems with her L1 and S5 area.  Patient states she also had a nerve conduction study which confirmed this. Patient reports she is scheduled for an MRI on 08/02/18.  Patient states her doctor discontinued her gabapentin and started her on Lyrica.  Patient states she does not feel like her energy is much improved due to the leg pain she is having. Patient states she had to discontinue her physical therapy because it was making the leg pain worse.  Patient states it is difficult for her to walk due to the pain so she is not doing much but sitting around.  Patient reports her pain level is a 0 when sitting and between a 9-10 when standing/ walking.  Patient states she still has constipation on occasion.  She states she continues to take her stool softner and is eating more vegetables and drinking prune juice.   RNCM advised patient to warm the prune juice which may help with the constipation.    Patient verbally agreed to next outreach with RNCM.  RNCM advised patient to notify MD of any changes in condition prior to scheduled appointment.  ASSESSMENT: Ongoing supportive care for parkinson's and related symptoms.   PLAN: RNCM will follow up with patient within 4 weeks.   Quinn Plowman RN,BSN,CCM [facility] Telephonic  [phone]

## 2018-07-30 ENCOUNTER — Ambulatory Visit: Payer: PPO | Admitting: Physical Therapy

## 2018-08-02 ENCOUNTER — Ambulatory Visit
Admission: RE | Admit: 2018-08-02 | Discharge: 2018-08-02 | Disposition: A | Discharge status: Home / Self Care | Payer: PPO | Source: Ambulatory Visit | Attending: Internal Medicine | Admitting: Internal Medicine

## 2018-08-02 DIAGNOSIS — M4807 Spinal stenosis, lumbosacral region: Secondary | ICD-10-CM | POA: Diagnosis not present

## 2018-08-02 DIAGNOSIS — M47896 Other spondylosis, lumbar region: Secondary | ICD-10-CM | POA: Insufficient documentation

## 2018-08-02 DIAGNOSIS — M5136 Other intervertebral disc degeneration, lumbar region: Secondary | ICD-10-CM | POA: Diagnosis not present

## 2018-08-02 DIAGNOSIS — M48062 Spinal stenosis, lumbar region with neurogenic claudication: Secondary | ICD-10-CM

## 2018-08-02 DIAGNOSIS — M545 Low back pain: Secondary | ICD-10-CM | POA: Diagnosis not present

## 2018-08-03 ENCOUNTER — Other Ambulatory Visit: Payer: Self-pay | Admitting: Internal Medicine

## 2018-08-03 ENCOUNTER — Ambulatory Visit: Payer: PPO | Admitting: Physical Therapy

## 2018-08-04 ENCOUNTER — Telehealth: Payer: Self-pay | Admitting: *Deleted

## 2018-08-04 DIAGNOSIS — M48062 Spinal stenosis, lumbar region with neurogenic claudication: Secondary | ICD-10-CM | POA: Insufficient documentation

## 2018-08-04 NOTE — Assessment & Plan Note (Signed)
Confirmed by MRI and EMG /nerve conduction studies.  ,  Referral to neurosurgery recommended.

## 2018-08-04 NOTE — Telephone Encounter (Signed)
Pt called for results of MRI. Informed pt they were not released and she would hear from the office once Dr. Derrel Nip reviews.

## 2018-08-05 ENCOUNTER — Ambulatory Visit: Payer: PPO | Admitting: Physical Therapy

## 2018-08-07 DIAGNOSIS — M48062 Spinal stenosis, lumbar region with neurogenic claudication: Secondary | ICD-10-CM

## 2018-08-10 ENCOUNTER — Ambulatory Visit: Payer: PPO | Admitting: Physical Therapy

## 2018-08-11 DIAGNOSIS — F4321 Adjustment disorder with depressed mood: Secondary | ICD-10-CM | POA: Diagnosis not present

## 2018-08-11 DIAGNOSIS — F411 Generalized anxiety disorder: Secondary | ICD-10-CM | POA: Diagnosis not present

## 2018-08-12 ENCOUNTER — Ambulatory Visit: Payer: PPO | Admitting: Physical Therapy

## 2018-08-17 ENCOUNTER — Ambulatory Visit: Payer: PPO | Admitting: Physical Therapy

## 2018-08-19 ENCOUNTER — Ambulatory Visit: Payer: PPO | Admitting: Physical Therapy

## 2018-08-20 NOTE — Progress Notes (Signed)
Deborah Velazquez was seen today in the movement disorders clinic for neurologic consultation at the request of [name], MD.  The consultation is for the evaluation of tremor and to r/o PD.  Pt previously seen Dr. [name].  The records that were made available to me were reviewed.  This patient is accompanied in the office by her spouse who supplements the history.   Patient first saw Dr. [name] on August 19, 2017.  She was diagnosed with Parkinson's disease at that visit and started on carbidopa/levodopa 25/100, 1 tablet 3 times per day.  She followed up in October, 2018 and stated that the medication definitely helped, but complained of fatigue.  She denies today that it was of benefit.  He recommended that she change to carbidopa/levodopa 25/100 CR, 1 tablet twice per day (8am/8pm). Her husband states that this also "turned her into a zombie."   He also recommended donepezil.  She states that she was not prescribed that and she doesn't know what that was for.  On pramipexole 1 mg for RLS x 10-20 years per patient.  03/11/18 update: Patient is seen in follow-up for Parkinson's disease.  Records have been reviewed since our last visit.  We decided to hold her daytime levodopa after last visit as the patient thought it was causing memory change (I was not convinced), although she still takes carbidopa/levodopa 50/200 at bedtime for restless leg.  She was weaned off pramipexole for restless leg. she saw Dr. [name] for neuropscyhometric testing on December 30, 2017 and subsequently had a feedback session with her where results and recommendations were given to her.  These are detailed within the chart.  In brief, this demonstrated mild cognitive impairment, but no evidence of dementia.  She called here on January 09, 2018 stating that she was having anxiety, diarrhea, cramping and "felt like a zombie."  We explained to her that she was likely very underdosed as she had refused medication.  Daytime  levodopa was offered at proper dosages, but she stated that she wanted to address anxiety with her primary care physician first.  She does report that she is back on levodopa now and started that on 3/15 but didn't get up to the dosage 25/100 IR tid until 5/11 (8am/4pm/11pm).   She did see her PCP and was given as needed Xanax and increased her Zoloft to 150 mg daily.  Primary care records are reviewed.  Her primary care physician did tell her that he really did not want her to use scheduled Xanax and would prefer sertraline and buspar.  She saw her primary care physician on January 23, 2018 and it was recommended that she discontinue her levodopa as she told him she felt much worse on the medication.  She called her primary care physician on January 29, 2018 stating that she was doing worse and she was placed on gabapentin for restless leg.  She called her primary care physician back on February 03, 2018 due to anxiety and psychiatry was recommended.  She didn't go see psychiatry.  Anxiety continues to be a big issue.  She states that she is on hydroxizine and PCP took her off of xanax.  Husband finds that when she is busy she does much better.  She has poor motivation to be busy though.  She walked some yesterday (20 min).  One fall when carrying groceries but didn't get hurt.    08/24/18 update: Patient seen today in follow-up for Parkinson's disease.  She  is accompanied by her friend who supplements the history.  She is on carbidopa/levodopa 25/100, 2 tablets in the morning, 1 in the afternoon and 1 in the evening and last visit we added carbidopa/levodopa 50/200 at bedtime.  Records are reviewed since our last visit.  She has been to her primary care physician several times for muscle aching and pain and back pain.  MRI was ordered of the lumbar spine and demonstrated severe L5-S1 facet arthrosis with mod CCS and mild bilateral NFS.  EMG was ordered and demonstrated chronic S1 radiculopathy bilaterally. She states that  she is awaiting an appt with neurosx.  She states that she has significant back pain, which has limited her ability to exercise.  Her friend asked me to test her for "toxins and pesticides" [place].  And also ask about an MRI of the brain.  Her friend thinks that she needs to be off of her anxiety medications and "deal with it" because she thinks they are making her very sleepy and the patient is not getting things done.   ALLERGIES:   Allergies  Allergen Reactions  . Pseudoephedrine Hcl Other (See Comments)    Other Reaction: nervous  . Sudafed [Pseudoephedrine]     CURRENT MEDICATIONS:  Outpatient Encounter Medications as of 08/24/2018  Medication Sig  . atorvastatin (LIPITOR) 10 MG tablet Take 1 tablet (10 mg total) by mouth at bedtime.  . carbidopa-levodopa (SINEMET CR) 50-200 MG tablet Take 1 tablet by mouth at bedtime.  . carbidopa-levodopa (SINEMET IR) 25-100 MG tablet 2 in the morning, 1 in the afternoon, 1 in the evening (Patient taking differently: 2 at 7am, 1 at 11am, 1 at 3pm)  . citalopram (CELEXA) 40 MG tablet Take 40 mg by mouth daily.  Marland Kitchen co-enzyme Q-10 30 MG capsule Take 100 mg by mouth daily.  . cyclobenzaprine (FLEXERIL) 5 MG tablet Take 5 mg by mouth 3 (three) times daily as needed for muscle spasms.  Marland Kitchen docusate sodium (COLACE) 100 MG capsule Take 2 capsules (200 mg total) by mouth at bedtime.  . DULoxetine (CYMBALTA) 20 MG capsule Take 40 mg by mouth daily.  . meloxicam (MOBIC) 15 MG tablet TAKE 1 TABLET BY MOUTH EVERY DAY AS NEEDED FOR MUSCLE SPAMS  . Omega-3 Fatty Acids (FISH OIL) 1000 MG CAPS Take by mouth.  . pregabalin (LYRICA) 75 MG capsule Take 1 capsule (75 mg total) by mouth 2 (two) times daily.  . vitamin B-12 (CYANOCOBALAMIN) 250 MCG tablet Take 250 mcg by mouth daily.  . [DISCONTINUED] gabapentin (NEURONTIN) 100 MG capsule TAKE 1 CAPSULE BY MOUTH AT BREAKFAST, LUNCH, AND DINNER AND 2 AT BEDTIME (Patient not  taking: Reported on 07/29/2018)  . [DISCONTINUED] sertraline (ZOLOFT) 50 MG tablet    No facility-administered encounter medications on file as of 08/24/2018.     PAST MEDICAL HISTORY:   Past Medical History:  Diagnosis Date  . Anxiety   . Hyperlipemia   . Irritable bowel syndrome   . Parkinson disease ([place])   . Restless leg syndrome   . RSD (reflex sympathetic dystrophy)    pt denies    PAST SURGICAL HISTORY:   Past Surgical History:  Procedure Laterality Date  . APPENDECTOMY    . BREAST BIOPSY Left 08/16/2013   neg  . CATARACT EXTRACTION Left   . COLONOSCOPY    . COLONOSCOPY WITH PROPOFOL N/A 06/20/2015   Procedure: COLONOSCOPY WITH PROPOFOL;  Surgeon: Lollie Sails,  MD;  Location: [facility] ENDOSCOPY;  Service: Endoscopy;  Laterality: N/A;  . FRACTURE SURGERY     wrist - right  . PARS PLANA VITRECTOMY Left 2010    SOCIAL HISTORY:   Social History   Socioeconomic History  . Marital status: Married    Spouse name: Not on file  . Number of children: Not on file  . Years of education: Not on file  . Highest education level: Not on file  Occupational History  . Occupation: retired    Comment: Geographical information systems officer  . Financial resource strain: Not on file  . Food insecurity:    Worry: Not on file    Inability: Not on file  . Transportation needs:    Medical: Not on file    Non-medical: Not on file  Tobacco Use  . Smoking status: Never Smoker  . Smokeless tobacco: Never Used  Substance and Sexual Activity  . Alcohol use: Yes    Alcohol/week: 7.0 standard drinks    Types: 7 Glasses of wine per week    Comment: 1-2 x a week  . Drug use: No  . Sexual activity: Not Currently  Lifestyle  . Physical activity:    Days per week: Not on file    Minutes per session: Not on file  . Stress: Not on file  Relationships  . Social connections:    Talks on phone: Not on file    Gets together: Not on file    Attends religious service: Not on file    Active member of club  or organization: Not on file    Attends meetings of clubs or organizations: Not on file    Relationship status: Not on file  . Intimate partner violence:    Fear of current or ex partner: Not on file    Emotionally abused: Not on file    Physically abused: Not on file    Forced sexual activity: Not on file  Other Topics Concern  . Not on file  Social History Narrative  . Not on file    FAMILY HISTORY:   Family Status  Relation Name Status  . Mother  Deceased  . Father  Deceased  . Mat Aunt  (Not Specified)    ROS: Review of Systems  Constitutional: Positive for malaise/fatigue.  HENT: Negative.   Eyes: Negative.   Respiratory: Negative.   Cardiovascular: Negative.   Gastrointestinal: Negative.   Genitourinary: Negative.   Musculoskeletal: Positive for back pain.  Skin: Negative.   Endo/Heme/Allergies: Negative.      PHYSICAL EXAMINATION:    VITALS:   Vitals:   08/24/18 1000  BP: 108/60  Pulse: 82  SpO2: 97%  Weight: 140 lb (63.5 kg)  Height: 5\' 6"  (1.676 m)    GEN:  The patient appears stated age and is in NAD. HEENT:  Normocephalic, atraumatic.  The mucous membranes are moist. The superficial temporal arteries are without ropiness or tenderness. CV:  RRR Lungs:  CTAB Neck/HEME:  There are no carotid bruits bilaterally.  Neurological examination:   Montreal Cognitive Assessment  12/16/2017  Visuospatial/ Executive (0/5) 3  Naming (0/3) 2  Attention: Read list of digits (0/2) 2  Attention: Read list of letters (0/1) 1  Attention: Serial 7 subtraction starting at 100 (0/3) 1  Language: Repeat phrase (0/2) 2  Language : Fluency (0/1) 1  Abstraction (0/2) 1  Delayed Recall (0/5) 4  Orientation (0/6) 6  Total 23  Adjusted Score (based on education) 23  Orientation: The patient is alert and oriented x3. Cranial nerves: There is good facial symmetry. The speech is fluent and clear. Soft palate rises symmetrically and there is no tongue deviation.  Hearing is intact to conversational tone. Sensation: Sensation is intact to light touch throughout Motor: Strength is 5/5 in the bilateral upper and lower extremities.   Shoulder shrug is equal and symmetric.  There is no pronator drift.  Movement examination: Tone: There is mild increased tone in the RUE/RLE Abnormal movements: none even with distraction Coordination:  There is mild decremation with any form of RAMS, including alternating supination and pronation of the forearm, hand opening and closing, finger taps, heel taps and toe taps bilaterally but its very mild Gait and Station: able to arise out of chair without using her hands.  She walks slowly down the hall and just slightly drags the left leg.  ASSESSMENT/PLAN:  1.  Idiopathic Parkinson's disease.    -We discussed the diagnosis as well as pathophysiology of the disease.  We discussed treatment options as well as prognostic indicators.  Patient education was provided.  -We discussed that it used to be thought that levodopa would increase risk of melanoma but now it is believed that Parkinsons itself likely increases risk of melanoma. she is to get regular skin checks.  -Patient asked me several questions about why I thought she had Parkinson's disease.  Discussed the criteria that she meets.  -Continue carbidopa/levodopa 25/100, 2 tablets in the morning, when the afternoon and 1 in the evening.  -Continue carbidopa/levodopa 50/200 at bed, which has helped with restless leg.  She is previously on Lyrica (instead was) for restless leg and that did not help the symptoms.  -talked about exercise and find an exercise that doesn't worsen her back.  -talked about regular schedule  -her friend asks me about toxins and testing her for these.  Discussed that toxins and pesticide exposure and [place] can increase risk for Parkinson's disease, but this does not mean that they would be present in the system currently.  I do not think that  she needs this testing.  She is going to ask primary care about this.  -her friend would like her to have an MRI.  I think that is reasonable.  We will order, although neuro exam is nonfocal and nonlateralizing.    2.  Memory change  -Neurocognitive testing in February, 2019 demonstrated no evidence of dementia, but only mild cognitive impairment.  Patient is still worried about this.  Provided reassurance again today.  3.  Anxiety  -I still think that this is the biggest issue, but she looks much better than she did.  Her friend wants her off of the meds from psychiatry, and I think that they are actually helping.  I do think she needs a counselor and we talked about this today.  Names were provided.    4.  Low back pain with MRI evidence of degenerative changes and central canal stenosis  -Referral was made by primary care to Dr. [name].  Patient is frustrated that she has not heard about date/time for the appointment.  Was given contact information to [place] [facility] to call.  5.  Follow up is anticipated in the next few months, sooner should new neurologic issues arise.  Much greater than 50% of this visit was spent in counseling and coordinating care.  Total face to face time:  25 min   Cc:  [name], MD

## 2018-08-21 ENCOUNTER — Other Ambulatory Visit: Payer: Self-pay

## 2018-08-21 NOTE — Patient Outreach (Signed)
[facility]) Care Management  [place]   08/21/2018  Deborah Velazquez January 12, 1946 [id number]   40year oldfemalereferred to [place] [facility] by nurse referral line. [place] services requested for medication management for recent diagnosis of Parkinson's Disease and anxiety.PMHx includes, but not limited JO:INOMVEHMC'N Disease, anxiety, hyperlipidemia, restless leg syndrome.   Incoming call received from Deborah Velazquez. HIPAA identifiers verified.  Subjective: Deborah Velazquez questions if she can take duloxetine in the morning when she takes her citalopram and pregabalin.  She states that she is feeling more anxious now that she has increased her duloxetine dose to 40 mg/day.  She states that overall she does feel like her anxiety is improved since zoloft was discontinued and  duloxetine was started.  She state that she feels drowsy after taking her morning medications.  She reports that she has spinal stenosis and is waiting to meet with a surgeon.  Current Medications: Current Outpatient Medications  Medication Sig Dispense Refill  . carbidopa-levodopa (SINEMET CR) 50-200 MG tablet Take 1 tablet by mouth at bedtime. 90 tablet 0  . carbidopa-levodopa (SINEMET IR) 25-100 MG tablet 2 in the morning, 1 in the afternoon, 1 in the evening (Patient taking differently: 1-2 tablets. 2 in the morning, 1 in the noon, 1 in the 1600) 360 tablet 1  . citalopram (CELEXA) 40 MG tablet Take 40 mg by mouth daily.    . DULoxetine (CYMBALTA) 20 MG capsule Take 40 mg by mouth daily.    . pregabalin (LYRICA) 75 MG capsule Take 1 capsule (75 mg total) by mouth 2 (two) times daily. 60 capsule 1  . atorvastatin (LIPITOR) 10 MG tablet Take 1 tablet (10 mg total) by mouth at bedtime. 90 tablet 1  . co-enzyme Q-10 30 MG capsule Take 100 mg by mouth daily.    . cyclobenzaprine (FLEXERIL) 5 MG tablet Take 5 mg by mouth 3 (three) times daily as needed for muscle spasms.    Marland Kitchen docusate sodium  (COLACE) 100 MG capsule Take 2 capsules (200 mg total) by mouth at bedtime. 60 capsule 5  . gabapentin (NEURONTIN) 100 MG capsule TAKE 1 CAPSULE BY MOUTH AT BREAKFAST, LUNCH, AND DINNER AND 2 AT BEDTIME (Patient not taking: Reported on 07/29/2018) 150 capsule 1  . meloxicam (MOBIC) 15 MG tablet TAKE 1 TABLET BY MOUTH EVERY DAY AS NEEDED FOR MUSCLE SPAMS 30 tablet 2  . Omega-3 Fatty Acids (FISH OIL) 1000 MG CAPS Take by mouth.    . sertraline (ZOLOFT) 50 MG tablet     . vitamin B-12 (CYANOCOBALAMIN) 250 MCG tablet Take 250 mcg by mouth daily.     No current facility-administered medications for this visit.    Medication Management: Informed Deborah Velazquez that there are no drug interactions between duloxetine, citalopram and pregabalin.  Informed her that she may notice more drowsiness, which may be an additive effect when taking these three medications in the morning.  Recommended that she move the duloxetine to bedtime, since that was the newest addition to her regimen.   Encouraged her to give her body time to adjust to the new dose and see if her perceived increased level of anxiety goes back to her baseline.   She verbalized understanding.   Informed Deborah Velazquez to call me anytime with questions.   [name], PharmD Clinical Pharmacist [place] [phone]

## 2018-08-24 ENCOUNTER — Ambulatory Visit: Payer: Self-pay

## 2018-08-24 ENCOUNTER — Ambulatory Visit: Payer: PPO | Admitting: Physical Therapy

## 2018-08-24 ENCOUNTER — Encounter: Payer: Self-pay | Admitting: Neurology

## 2018-08-24 ENCOUNTER — Ambulatory Visit (INDEPENDENT_AMBULATORY_CARE_PROVIDER_SITE_OTHER): Payer: PPO | Admitting: Neurology

## 2018-08-24 ENCOUNTER — Telehealth: Payer: Self-pay

## 2018-08-24 VITALS — BP 108/60 | HR 82 | Ht 66.0 in | Wt 140.0 lb

## 2018-08-24 DIAGNOSIS — G3184 Mild cognitive impairment, so stated: Secondary | ICD-10-CM | POA: Diagnosis not present

## 2018-08-24 DIAGNOSIS — M545 Low back pain: Secondary | ICD-10-CM

## 2018-08-24 DIAGNOSIS — G20 Parkinson's disease: Secondary | ICD-10-CM

## 2018-08-24 MED ORDER — CARBIDOPA-LEVODOPA 25-100 MG PO TABS: ORAL_TABLET | ORAL | 1 refills | Status: DC

## 2018-08-24 MED ORDER — CARBIDOPA-LEVODOPA ER 50-200 MG PO TBCR: 1.0000 | EXTENDED_RELEASE_TABLET | Freq: Every day | ORAL | 1 refills | Status: DC

## 2018-08-24 NOTE — Patient Instructions (Addendum)
1.  Call the neurosurgery office and check on your appt with Dr. [name]  Address: [address] [place], [place], [place] [address]  Phone: [phone] 2.  You need to add in a counselor for therapy for the anxiety associated with chronic disease. We recommend Cristin Saffo -  Address: [address], [place], [place] [address] Phone: [phone] 3. Order placed for MR Brain. They will call you from [place].

## 2018-08-24 NOTE — Telephone Encounter (Signed)
Copied from [place] [id number]. Topic: General - Other >> Aug 24, 2018  3:50 PM Janace Aris A wrote: Reason for CRM: patient called in regards to Dr. [name] never recieving the referral request from our office for the MRI results. Please send request to [facility].    [phone]

## 2018-08-25 NOTE — Telephone Encounter (Signed)
MRI is included in the referral that was sent to Dr. [name] through [facility]. Referral has been flagged with the note attached to [facility] stating that the MRI is included.

## 2018-08-26 ENCOUNTER — Ambulatory Visit: Payer: PPO | Admitting: Neurology

## 2018-08-26 ENCOUNTER — Ambulatory Visit: Payer: PPO | Admitting: Physical Therapy

## 2018-08-26 ENCOUNTER — Other Ambulatory Visit: Payer: Self-pay

## 2018-08-26 NOTE — Telephone Encounter (Signed)
Pt calling  To f/up.   Pt states she spoke with Rollene Fare this morning, and was told the referral had not been received.

## 2018-08-26 NOTE — Patient Outreach (Signed)
[facility]) Care Management  08/26/2018  Deborah Velazquez 07/13/1946 [id number]   Telephone assessment:  Telephone call to patient regarding referral. Unable to reach patient. HIPAA compliant voice message left with call back phone number.   PLAN: RNCM will attempt 2nd telephone call to patient within 4 business days.    Quinn Plowman RN,BSN, [place] Telephonic  [phone]

## 2018-08-28 ENCOUNTER — Telehealth: Payer: Self-pay

## 2018-08-28 NOTE — Telephone Encounter (Signed)
Copied from [place] [phone]. Topic: Referral - Question >> Aug 26, 2018 10:19 AM [place], Deborah Velazquez wrote: Reason for CRM:   Pt requesting call back from Dr Derrel Nip or her asst. re: referral to Dr [name].   [phone]

## 2018-08-28 NOTE — Telephone Encounter (Signed)
Patient has an appointment on 09/04/18 with Dr. [name] .

## 2018-09-01 ENCOUNTER — Other Ambulatory Visit: Payer: Self-pay

## 2018-09-01 NOTE — Patient Outreach (Signed)
[facility]) Care Management  09/01/2018  Deborah Velazquez [birth year] [id number]  Telephone assessment:   Patient states her doctor has her taking meloxicam daily and tylenol.  Patient reports her pain has decreased since taking these medications. Patient reports her leg pain on yesterday was between 2-3. She reports today's leg pain at an 8.  Patient states she has been more active today.  Patient stats she is scheduled to see an orthopedic surgeon regarding the leg pain on Monday 10/ 14/19.  Patient states she is going to call her primary MD today to request an increase in her pain medication..  Patient states she continues to have constipation.  Patient states she continues to take her stool softener and miralax. RNCM advised patient to mention this to her doctor when she calls office today.  Patient states her feet burn as well.  RNCM advised patient to inform her doctor of this. Patient states she is borderline diabetic and is controlling this with diet.   Patient agreeable to ongoing calls and support from [facility].   PLAN; RNCM will follow up with patient within 4 business days.   Quinn Plowman RN,BSN,CCM [facility] Telephonic  [phone]

## 2018-09-02 ENCOUNTER — Other Ambulatory Visit: Payer: Self-pay | Admitting: Internal Medicine

## 2018-09-02 MED ORDER — HYDROCODONE-ACETAMINOPHEN 10-325 MG PO TABS: 1.0000 | ORAL_TABLET | Freq: Four times a day (QID) | ORAL | 0 refills | Status: DC | PRN

## 2018-09-03 NOTE — Telephone Encounter (Signed)
Refilled: 07/13/2018 Last OV: 07/13/2018 Next OV: 09/11/2018

## 2018-09-06 ENCOUNTER — Other Ambulatory Visit: Payer: Self-pay | Admitting: Internal Medicine

## 2018-09-08 DIAGNOSIS — M4317 Spondylolisthesis, lumbosacral region: Secondary | ICD-10-CM | POA: Diagnosis not present

## 2018-09-08 DIAGNOSIS — M48062 Spinal stenosis, lumbar region with neurogenic claudication: Secondary | ICD-10-CM | POA: Diagnosis not present

## 2018-09-09 ENCOUNTER — Other Ambulatory Visit: Payer: Self-pay | Admitting: Neurosurgery

## 2018-09-10 DIAGNOSIS — F4321 Adjustment disorder with depressed mood: Secondary | ICD-10-CM | POA: Diagnosis not present

## 2018-09-10 DIAGNOSIS — F411 Generalized anxiety disorder: Secondary | ICD-10-CM | POA: Diagnosis not present

## 2018-09-11 ENCOUNTER — Ambulatory Visit (INDEPENDENT_AMBULATORY_CARE_PROVIDER_SITE_OTHER): Payer: PPO | Admitting: Internal Medicine

## 2018-09-11 ENCOUNTER — Encounter: Payer: Self-pay | Admitting: Internal Medicine

## 2018-09-11 VITALS — BP 114/72 | HR 82 | Temp 98.4°F | Resp 15 | Ht 66.0 in | Wt 142.2 lb

## 2018-09-11 DIAGNOSIS — Z23 Encounter for immunization: Secondary | ICD-10-CM

## 2018-09-11 DIAGNOSIS — Z01818 Encounter for other preprocedural examination: Secondary | ICD-10-CM | POA: Diagnosis not present

## 2018-09-11 DIAGNOSIS — M48062 Spinal stenosis, lumbar region with neurogenic claudication: Secondary | ICD-10-CM | POA: Diagnosis not present

## 2018-09-11 DIAGNOSIS — R252 Cramp and spasm: Secondary | ICD-10-CM | POA: Diagnosis not present

## 2018-09-11 LAB — COMPREHENSIVE METABOLIC PANEL
ALBUMIN: 4.5 g/dL (ref 3.5–5.2)
ALT: 4 U/L (ref 0–35)
AST: 14 U/L (ref 0–37)
Alkaline Phosphatase: 58 U/L (ref 39–117)
BUN: 17 mg/dL (ref 6–23)
CHLORIDE: 103 meq/L (ref 96–112)
CO2: 31 mEq/L (ref 19–32)
Calcium: 9.7 mg/dL (ref 8.4–10.5)
Creatinine, Ser: 0.77 mg/dL (ref 0.40–1.20)
GFR: 77.85 mL/min (ref >60.00)
Glucose, Bld: 113 mg/dL — ABNORMAL HIGH (ref 70–99)
POTASSIUM: 4 meq/L (ref 3.5–5.1)
SODIUM: 140 meq/L (ref 135–145)
Total Bilirubin: 0.5 mg/dL (ref 0.2–1.2)
Total Protein: 7.3 g/dL (ref 6.0–8.3)

## 2018-09-11 LAB — TSH: TSH: 1.53 u[IU]/mL (ref 0.35–4.50)

## 2018-09-11 LAB — CBC WITH DIFFERENTIAL/PLATELET
BASOS PCT: 0.4 % (ref 0.0–3.0)
Basophils Absolute: 0 10*3/uL (ref 0.0–0.1)
EOS PCT: 0.3 % (ref 0.0–5.0)
Eosinophils Absolute: 0 10*3/uL (ref 0.0–0.7)
HEMATOCRIT: 40.3 % (ref 36.0–46.0)
HEMOGLOBIN: 13.6 g/dL (ref 12.0–15.0)
Lymphocytes Relative: 22.5 % (ref 12.0–46.0)
Lymphs Abs: 1.6 10*3/uL (ref 0.7–4.0)
MCHC: 33.8 g/dL (ref 30.0–36.0)
MCV: 89.9 fl (ref 78.0–100.0)
MONO ABS: 0.4 10*3/uL (ref 0.1–1.0)
Monocytes Relative: 6.1 % (ref 3.0–12.0)
Neutro Abs: 4.9 10*3/uL (ref 1.4–7.7)
Neutrophils Relative %: 70.7 % (ref 43.0–77.0)
Platelets: 232 10*3/uL (ref 150.0–400.0)
RBC: 4.48 Mil/uL (ref 3.87–5.11)
RDW: 14.6 % (ref 11.5–15.5)
WBC: 7 10*3/uL (ref 4.0–10.5)

## 2018-09-11 MED ORDER — HYDROCODONE-ACETAMINOPHEN 10-325 MG PO TABS: 1.0000 | ORAL_TABLET | Freq: Four times a day (QID) | ORAL | 0 refills | Status: DC | PRN

## 2018-09-11 NOTE — Patient Instructions (Addendum)
Use the hydrocone up to 4 times daily as needed (every 6 hours as needed)   Continue the meloxicam  Daily  Ok to take up to 2 more tylenol daily in between other meds if needed  ( max dose is 2500 mg daily )  Suspend the lyrica while yoy are taking hydrocodone to avoid oversedation  pick up Dulcolax,  E X Lax,  Or Sennokot   (stimulant laxatives)  To combat the constipation that will occur with hydrocodone use  You can continue colace but add one of these

## 2018-09-11 NOTE — Progress Notes (Signed)
Subjective:  Patient ID: Deborah Velazquez, female    DOB: 05-31-1946  Age: 72 y.o. MRN: [id number]  CC: The primary encounter diagnosis was Preoperative evaluation to rule out surgical contraindication. Diagnoses of Encounter for immunization, Neurogenic claudication due to lumbar spinal stenosis, and Leg cramps were also pertinent to this visit.  HPI Deborah Velazquez presents for follow up on leg pain and weakness secondary to lumbar spinal stenosis, recently identified by MRI.  Patient has been evaluated by Neurosurgery and  DR [name] has recommended  Posterior interbody fusion  Of L5-S1  Which is planned for Nov . 13 th.  She continues to have back pain with radiculopathy that is not controlled  Because she is afraid to use the medication that was prescribed 2 weeks ago due to fear of addiction.  Her pain limits her daily activities significantly and she is not sleeping well.   Preoperative evaluation done today .  She has no history of chest pain,  Shortness of breath,  Or cough.  Normal renal function.   Has prediabetes.   Patient remains unable to accept her diagnosis of Parkinson's Disease without considerable anxiety and doubt , but is taking her medication.        Outpatient Medications Prior to Visit  Medication Sig Dispense Refill  . acetaminophen (TYLENOL) 500 MG tablet Take 500 mg by mouth. Every 12 hours    . atorvastatin (LIPITOR) 10 MG tablet Take 1 tablet (10 mg total) by mouth at bedtime. 90 tablet 1  . carbidopa-levodopa (SINEMET CR) 50-200 MG tablet Take 1 tablet by mouth at bedtime. 90 tablet 1  . carbidopa-levodopa (SINEMET IR) 25-100 MG tablet 2 in the morning, 1 in the afternoon, 1 in the evening 360 tablet 1  . citalopram (CELEXA) 40 MG tablet Take 40 mg by mouth daily.    Marland Kitchen co-enzyme Q-10 30 MG capsule Take 100 mg by mouth daily.    . cyclobenzaprine (FLEXERIL) 5 MG tablet TAKE 1 TABLET BY MOUTH EVERY 8 HOURS AS NEEDED FOR MUSCLE SPAMS 60 tablet 1  . docusate sodium  (COLACE) 100 MG capsule Take 2 capsules (200 mg total) by mouth at bedtime. 60 capsule 5  . DULoxetine (CYMBALTA) 20 MG capsule Take 40 mg by mouth daily.     Marland Kitchen LYRICA 75 MG capsule TAKE 1 CAPSULE(75 MG) BY MOUTH TWICE DAILY 60 capsule 0  . meloxicam (MOBIC) 15 MG tablet TAKE 1 TABLET BY MOUTH EVERY DAY AS NEEDED FOR MUSCLE SPAMS 30 tablet 2  . Omega-3 Fatty Acids (FISH OIL) 1000 MG CAPS Take by mouth.    . vitamin B-12 (CYANOCOBALAMIN) 250 MCG tablet Take 250 mcg by mouth daily.    Marland Kitchen HYDROcodone-acetaminophen (NORCO) 10-325 MG tablet Take 1 tablet by mouth every 6 (six) hours as needed. (Patient not taking: Reported on 09/11/2018) 30 tablet 0   No facility-administered medications prior to visit.     Review of Systems;  Patient denies headache, fevers, malaise, unintentional weight loss, skin rash, eye pain, sinus congestion and sinus pain, sore throat, dysphagia,  hemoptysis , cough, dyspnea, wheezing, chest pain, palpitations, orthopnea, edema, abdominal pain, nausea, melena, diarrhea, constipation, flank pain, dysuria, hematuria, urinary  Frequency, nocturia,  tingling, seizures,  Focal weakness, Loss of consciousness,  insomnia, depression,  and suicidal ideation.      Objective:  BP 114/72 (BP Location: Left Arm, Patient Position: Sitting, Cuff Size: Normal)   Pulse 82   Temp 98.4 F (36.9 C) (Oral)  Resp 15   Ht 5\' 6"  (1.676 m)   Wt 142 lb 3.2 oz (64.5 kg)   SpO2 98%   BMI 22.95 kg/m   BP Readings from Last 3 Encounters:  09/11/18 114/72  08/24/18 108/60  07/13/18 110/60    Wt Readings from Last 3 Encounters:  09/11/18 142 lb 3.2 oz (64.5 kg)  08/24/18 140 lb (63.5 kg)  07/13/18 140 lb 12.8 oz (63.9 kg)    General appearance: alert, cooperative and appears stated age Ears: normal TM's and external ear canals both ears Throat: lips, mucosa, and tongue normal; teeth and gums normal Neck: no adenopathy, no carotid bruit, supple, symmetrical, trachea midline and  thyroid not enlarged, symmetric, no tenderness/mass/nodules Back: symmetric, no curvature. ROM normal. No CVA tenderness. Lungs: clear to auscultation bilaterally Heart: regular rate and rhythm, S1, S2 normal, no murmur, click, rub or gallop Abdomen: soft, non-tender; bowel sounds normal; no masses,  no organomegaly Pulses: 2+ and symmetric Skin: Skin color, texture, turgor normal. No rashes or lesions Lymph nodes: Cervical, supraclavicular, and axillary nodes normal.  Lab Results  Component Value Date   HGBA1C 6.4 07/09/2018   HGBA1C 6.3 04/02/2018    Lab Results  Component Value Date   CREATININE 0.77 09/11/2018   CREATININE 0.87 07/09/2018    Lab Results  Component Value Date   WBC 7.0 09/11/2018   HGB 13.6 09/11/2018   HCT 40.3 09/11/2018   PLT 232.0 09/11/2018   GLUCOSE 113 (H) 09/11/2018   CHOL 183 07/09/2018   TRIG 132.0 07/09/2018   HDL 75.60 07/09/2018   LDLCALC 81 07/09/2018   ALT 4 09/11/2018   AST 14 09/11/2018   NA 140 09/11/2018   K 4.0 09/11/2018   CL 103 09/11/2018   CREATININE 0.77 09/11/2018   BUN 17 09/11/2018   CO2 31 09/11/2018   TSH 1.53 09/11/2018   HGBA1C 6.4 07/09/2018    Mr Lumbar Spine Wo Contrast  Result Date: 08/03/2018 CLINICAL DATA:  Low back pain and bilateral leg pain. EXAM: MRI LUMBAR SPINE WITHOUT CONTRAST TECHNIQUE: Multiplanar, multisequence MR imaging of the lumbar spine was performed. No intravenous contrast was administered. COMPARISON:  Lumbar spine radiograph 07/13/2018 FINDINGS: Segmentation: There is transitional lumbosacral anatomy with the numbering scheme preserved from the prior radiograph. Alignment:  Grade 1 anterolisthesis at L5-S1. Vertebrae:  No fracture, evidence of discitis, or bone lesion. Conus medullaris and cauda equina: Conus extends to the L2 level. Conus and cauda equina appear normal. Paraspinal and other soft tissues: Negative. Disc levels: T11-T12: Imaged only in the sagittal plane.  Normal. T12-L1: Normal.  L1-L2: Minimal disc bulge without spinal canal or neural foraminal stenosis. Normal facets. L2-L3: Small disc bulge. No spinal canal or neural foraminal stenosis. L3-L4: Normal. L4-L5: Moderate facet hypertrophy and small disc bulge. No spinal canal or neural foraminal stenosis. L5-S1: Severe bilateral facet hypertrophy resulting in grade 1 anterolisthesis with disc uncovering. Moderate spinal canal stenosis. Mild bilateral neural foraminal stenosis. IMPRESSION: 1. Severe L5-S1 facet arthrosis causing grade 1 anterolisthesis, moderate spinal canal stenosis and mild bilateral neural foraminal stenosis. 2. Moderate L4-5 facet arthrosis. 3. Transitional lumbosacral anatomy. Electronically Signed   By: Ulyses Jarred M.D.   On: 08/03/2018 02:28    Assessment & Plan:   Problem List Items Addressed This Visit    Leg cramps    Secondary to neurogenic claudication from lumbar spinal stenosis       Neurogenic claudication due to lumbar spinal stenosis    Patient  is  encouraged to use the medication prescribed for her pain until she can have her surgery,  She is considered to be at low risk  For perioperative complications  Based on today's exam and history.  Baseline lytes,  Thyroid screen,  hgb and ekg done.    Lab Results  Component Value Date   WBC 7.0 09/11/2018   HGB 13.6 09/11/2018   HCT 40.3 09/11/2018   MCV 89.9 09/11/2018   PLT 232.0 09/11/2018     Lab Results  Component Value Date   TSH 1.53 09/11/2018   Lab Results  Component Value Date   NA 140 09/11/2018   K 4.0 09/11/2018   CL 103 09/11/2018   CO2 31 09/11/2018   Lab Results  Component Value Date   CREATININE 0.77 09/11/2018   Lab Results  Component Value Date   HGBA1C 6.4 07/09/2018         Relevant Medications   HYDROcodone-acetaminophen (NORCO) 10-325 MG tablet   Preoperative evaluation to rule out surgical contraindication - Primary    I have reviewed her EKG which was done today during her preoperative  evaluation.  She has no evidence of  Heart disease or arrhythmias and is consdiered low risk for surgical complications.       Relevant Orders   TSH (Completed)   Comprehensive metabolic panel (Completed)   CBC with Differential/Platelet (Completed)   EKG 12-Lead (Completed)    Other Visit Diagnoses    Encounter for immunization       Relevant Orders   Flu vaccine HIGH DOSE PF (Completed)      I am having Deborah Velazquez maintain her Fish Oil, co-enzyme Q-10, vitamin B-12, atorvastatin, citalopram, docusate sodium, meloxicam, DULoxetine, carbidopa-levodopa, carbidopa-levodopa, acetaminophen, LYRICA, cyclobenzaprine, and HYDROcodone-acetaminophen.  Meds ordered this encounter  Medications  . HYDROcodone-acetaminophen (NORCO) 10-325 MG tablet    Sig: Take 1 tablet by mouth every 6 (six) hours as needed.    Dispense:  30 tablet    Refill:  0    Patient never picked up the original rx on oct 9    Medications Discontinued During This Encounter  Medication Reason  . HYDROcodone-acetaminophen (NORCO) 10-325 MG tablet Reorder    Follow-up: No follow-ups on file.   [name], MD

## 2018-09-13 DIAGNOSIS — Z01818 Encounter for other preprocedural examination: Secondary | ICD-10-CM | POA: Insufficient documentation

## 2018-09-13 NOTE — Assessment & Plan Note (Signed)
Patient  is encouraged to use the medication prescribed for her pain until she can have her surgery,  She is considered to be at low risk  For perioperative complications  Based on today's exam and history.  Baseline lytes,  Thyroid screen,  hgb and ekg done.    Lab Results  Component Value Date   WBC 7.0 09/11/2018   HGB 13.6 09/11/2018   HCT 40.3 09/11/2018   MCV 89.9 09/11/2018   PLT 232.0 09/11/2018     Lab Results  Component Value Date   TSH 1.53 09/11/2018   Lab Results  Component Value Date   NA 140 09/11/2018   K 4.0 09/11/2018   CL 103 09/11/2018   CO2 31 09/11/2018   Lab Results  Component Value Date   CREATININE 0.77 09/11/2018   Lab Results  Component Value Date   HGBA1C 6.4 07/09/2018

## 2018-09-13 NOTE — Assessment & Plan Note (Signed)
Secondary to neurogenic claudication from lumbar spinal stenosis

## 2018-09-13 NOTE — Assessment & Plan Note (Signed)
I have reviewed her EKG which was done today during her preoperative evaluation.  She has no evidence of  Heart disease or arrhythmias and is consdiered low risk for surgical complications.

## 2018-09-22 ENCOUNTER — Other Ambulatory Visit: Payer: Self-pay | Admitting: Neurosurgery

## 2018-09-23 DIAGNOSIS — F4321 Adjustment disorder with depressed mood: Secondary | ICD-10-CM | POA: Diagnosis not present

## 2018-09-23 DIAGNOSIS — F411 Generalized anxiety disorder: Secondary | ICD-10-CM | POA: Diagnosis not present

## 2018-09-25 NOTE — Pre-Procedure Instructions (Signed)
JULEE STOLL  09/25/2018      [id number] [address], [place] AT [place] [address] [place] [address] Phone: [phone] Fax: [phone]    Your procedure is scheduled on Wednesday November 13th.  Report to [facility] Admitting at 8:15 A.M.  Call this number if you have problems the morning of surgery:  [phone]   Remember:  Do not eat or drink after midnight.   Take these medicines the morning of surgery with A SIP OF WATER  carbidopa-levodopa (SINEMET CR) citalopram (CELEXA) DULoxetine (CYMBALTA) LYRICA  HYDROcodone-acetaminophen (NORCO)- if needed cyclobenzaprine (FLEXERIL)- if needed acetaminophen (TYLENOL)- if needed  7 days prior to surgery STOP taking any Aspirin(unless otherwise instructed by your surgeon), Mobic (meloxicam) Aleve, Naproxen, Ibuprofen, Motrin, Advil, Goody's, BC's, all herbal medications, fish oil, and all vitamins    Do not wear jewelry, make-up or nail polish.  Do not wear lotions, powders, or perfumes, or deodorant.  Do not shave 48 hours prior to surgery.  Men may shave face and neck.  Do not bring valuables to the hospital.  [facility] is not responsible for any belongings or valuables.  Contacts, eyeglasses, hearing aids, dentures or bridgework may not be worn into surgery.  Leave your suitcase in the car.  After surgery it may be brought to your room.  For patients admitted to the hospital, discharge time will be determined by your treatment team.  Patients discharged the day of surgery will not be allowed to drive home.   [facility]- Preparing For Surgery  Before surgery, you can play an important role. Because skin is not sterile, your skin needs to be as free of germs as possible. You can reduce the number of germs on your skin by washing with CHG (chlorahexidine gluconate) Soap before surgery.  CHG is an antiseptic cleaner  which kills germs and bonds with the skin to continue killing germs even after washing.    Oral Hygiene is also important to reduce your risk of infection.  Remember - BRUSH YOUR TEETH THE MORNING OF SURGERY WITH YOUR REGULAR TOOTHPASTE  Please do not use if you have an allergy to CHG or antibacterial soaps. If your skin becomes reddened/irritated stop using the CHG.  Do not shave (including legs and underarms) for at least 48 hours prior to first CHG shower. It is OK to shave your face.  Please follow these instructions carefully.   1. Shower the NIGHT BEFORE SURGERY and the MORNING OF SURGERY with CHG.   2. If you chose to wash your hair, wash your hair first as usual with your normal shampoo.  3. After you shampoo, rinse your hair and body thoroughly to remove the shampoo.  4. Use CHG as you would any other liquid soap. You can apply CHG directly to the skin and wash gently with a scrungie or a clean washcloth.   5. Apply the CHG Soap to your body ONLY FROM THE NECK DOWN.  Do not use on open wounds or open sores. Avoid contact with your eyes, ears, mouth and genitals (private parts). Wash Face and genitals (private parts)  with your normal soap.  6. Wash thoroughly, paying special attention to the area where your surgery will be performed.  7. Thoroughly rinse your body with warm water from the neck down.  8. DO NOT shower/wash with your normal soap after using and rinsing off the  CHG Soap.  9. Pat yourself dry with a CLEAN TOWEL.  10. Wear CLEAN PAJAMAS to bed the night before surgery, wear comfortable clothes the morning of surgery  11. Place CLEAN SHEETS on your bed the night of your first shower and DO NOT SLEEP WITH PETS.    Day of Surgery: Shower as stated above. Do not apply any deodorants/lotions.  Please wear clean clothes to the hospital/surgery center.   Remember to brush your teeth WITH YOUR REGULAR TOOTHPASTE.    Please read over the following fact sheets that  you were given.

## 2018-09-28 ENCOUNTER — Encounter (HOSPITAL_COMMUNITY): Payer: Self-pay

## 2018-09-28 ENCOUNTER — Other Ambulatory Visit: Payer: Self-pay

## 2018-09-28 ENCOUNTER — Encounter (HOSPITAL_COMMUNITY)
Admission: RE | Admit: 2018-09-28 | Discharge: 2018-09-28 | Disposition: A | Discharge status: Home / Self Care | Payer: PPO | Source: Ambulatory Visit | Attending: Neurosurgery | Admitting: Neurosurgery

## 2018-09-28 DIAGNOSIS — Z01818 Encounter for other preprocedural examination: Secondary | ICD-10-CM | POA: Insufficient documentation

## 2018-09-28 DIAGNOSIS — M48062 Spinal stenosis, lumbar region with neurogenic claudication: Secondary | ICD-10-CM | POA: Diagnosis not present

## 2018-09-28 HISTORY — DX: Unspecified osteoarthritis, unspecified site: M19.90

## 2018-09-28 LAB — CBC
HCT: 44.1 % (ref 36.0–46.0)
Hemoglobin: 13.7 g/dL (ref 12.0–15.0)
MCH: 28.8 pg (ref 26.0–34.0)
MCHC: 31.1 g/dL (ref 30.0–36.0)
MCV: 92.8 fL (ref 80.0–100.0)
PLATELETS: 246 10*3/uL (ref 150–400)
RBC: 4.75 MIL/uL (ref 3.87–5.11)
RDW: 13.1 % (ref 11.5–15.5)
WBC: 7.4 10*3/uL (ref 4.0–10.5)
nRBC: 0 % (ref 0.0–0.2)

## 2018-09-28 LAB — BASIC METABOLIC PANEL
Anion gap: 3 — ABNORMAL LOW (ref 5–15)
BUN: 14 mg/dL (ref 8–23)
CALCIUM: 9.6 mg/dL (ref 8.9–10.3)
CHLORIDE: 106 mmol/L (ref 98–111)
CO2: 28 mmol/L (ref 22–32)
Creatinine, Ser: 0.7 mg/dL (ref 0.44–1.00)
GFR calc Af Amer: >60 mL/min (ref >60)
GFR calc non Af Amer: >60 mL/min (ref >60)
GLUCOSE: 129 mg/dL — AB (ref 70–99)
POTASSIUM: 4.7 mmol/L (ref 3.5–5.1)
Sodium: 137 mmol/L (ref 135–145)

## 2018-09-28 LAB — TYPE AND SCREEN
ABO/RH(D): O POS
Antibody Screen: NEGATIVE

## 2018-09-28 LAB — SURGICAL PCR SCREEN
MRSA, PCR: NEGATIVE
Staphylococcus aureus: NEGATIVE

## 2018-09-28 LAB — ABO/RH: ABO/RH(D): O POS

## 2018-09-28 NOTE — Progress Notes (Signed)
PCP - Dr. Deborra Medina  Cardiologist - Denies  Chest x-ray - Denies  EKG - 09/11/18 (E)  Stress Test - Denies  ECHO - Denies  Cardiac Cath - Denies  AICD- na PM- na LOOP- na  Sleep Study - Denies CPAP - None  LABS- 09/28/18: CBC, BMP, T/S, PCR  ASA- Denies   Anesthesia- No  Pt denies having chest pain, sob, or fever at this time. All instructions explained to the pt, with a verbal understanding of the material. Pt agrees to go over the instructions while at home for a better understanding. The opportunity to ask questions was provided.

## 2018-09-29 ENCOUNTER — Telehealth: Payer: Self-pay | Admitting: Internal Medicine

## 2018-09-29 NOTE — Telephone Encounter (Signed)
Spoke with Deborah Velazquez regarding AWV. Patient declined to schedule appointment for this year. Patient stated that she will give the office a call next year (2020) to schedule wellness visit. SF

## 2018-09-30 ENCOUNTER — Other Ambulatory Visit: Payer: Self-pay | Admitting: Internal Medicine

## 2018-10-07 ENCOUNTER — Encounter (HOSPITAL_COMMUNITY)
Admission: RE | Disposition: A | Discharge status: Home / Self Care | Payer: Self-pay | Source: Home / Self Care | Attending: Neurosurgery

## 2018-10-07 ENCOUNTER — Encounter (HOSPITAL_COMMUNITY): Payer: Self-pay | Admitting: *Deleted

## 2018-10-07 ENCOUNTER — Inpatient Hospital Stay (HOSPITAL_COMMUNITY)
Admission: RE | Admit: 2018-10-07 | Discharge: 2018-10-08 | DRG: 455 | Disposition: A | Discharge status: Home / Self Care | Payer: PPO | Attending: Neurosurgery | Admitting: Neurosurgery

## 2018-10-07 ENCOUNTER — Inpatient Hospital Stay (HOSPITAL_COMMUNITY): Payer: PPO

## 2018-10-07 ENCOUNTER — Other Ambulatory Visit: Payer: Self-pay

## 2018-10-07 ENCOUNTER — Inpatient Hospital Stay (HOSPITAL_COMMUNITY): Payer: PPO | Admitting: Certified Registered"

## 2018-10-07 DIAGNOSIS — Z981 Arthrodesis status: Secondary | ICD-10-CM | POA: Diagnosis not present

## 2018-10-07 DIAGNOSIS — G20 Parkinson's disease: Secondary | ICD-10-CM | POA: Diagnosis present

## 2018-10-07 DIAGNOSIS — Z79899 Other long term (current) drug therapy: Secondary | ICD-10-CM

## 2018-10-07 DIAGNOSIS — M4326 Fusion of spine, lumbar region: Secondary | ICD-10-CM | POA: Diagnosis not present

## 2018-10-07 DIAGNOSIS — G2581 Restless legs syndrome: Secondary | ICD-10-CM | POA: Diagnosis not present

## 2018-10-07 DIAGNOSIS — M545 Low back pain: Secondary | ICD-10-CM | POA: Diagnosis present

## 2018-10-07 DIAGNOSIS — M4807 Spinal stenosis, lumbosacral region: Secondary | ICD-10-CM | POA: Diagnosis not present

## 2018-10-07 DIAGNOSIS — M5117 Intervertebral disc disorders with radiculopathy, lumbosacral region: Secondary | ICD-10-CM | POA: Diagnosis not present

## 2018-10-07 DIAGNOSIS — M48062 Spinal stenosis, lumbar region with neurogenic claudication: Secondary | ICD-10-CM | POA: Diagnosis not present

## 2018-10-07 DIAGNOSIS — E785 Hyperlipidemia, unspecified: Secondary | ICD-10-CM | POA: Diagnosis present

## 2018-10-07 DIAGNOSIS — Z419 Encounter for procedure for purposes other than remedying health state, unspecified: Secondary | ICD-10-CM

## 2018-10-07 DIAGNOSIS — M4317 Spondylolisthesis, lumbosacral region: Secondary | ICD-10-CM | POA: Diagnosis not present

## 2018-10-07 SURGERY — POSTERIOR LUMBAR FUSION 1 LEVEL
Anesthesia: General

## 2018-10-07 MED ORDER — PROPOFOL 10 MG/ML IV BOLUS
INTRAVENOUS | Status: DC | PRN
  Administered 2018-10-07: 100 mg via INTRAVENOUS

## 2018-10-07 MED ORDER — FENTANYL CITRATE (PF) 100 MCG/2ML IJ SOLN
INTRAMUSCULAR | Status: AC
  Filled 2018-10-07: qty 2

## 2018-10-07 MED ORDER — BUPIVACAINE-EPINEPHRINE 0.5% -1:200000 IJ SOLN
INTRAMUSCULAR | Status: AC
  Filled 2018-10-07: qty 1

## 2018-10-07 MED ORDER — LIDOCAINE 2% (20 MG/ML) 5 ML SYRINGE
INTRAMUSCULAR | Status: DC | PRN
  Administered 2018-10-07: 100 mg via INTRAVENOUS

## 2018-10-07 MED ORDER — MIDAZOLAM HCL 2 MG/2ML IJ SOLN
INTRAMUSCULAR | Status: AC
  Filled 2018-10-07: qty 2

## 2018-10-07 MED ORDER — OXYCODONE HCL 5 MG PO TABS
ORAL_TABLET | ORAL | Status: AC
  Filled 2018-10-07: qty 2

## 2018-10-07 MED ORDER — ONDANSETRON HCL 4 MG/2ML IJ SOLN
INTRAMUSCULAR | Status: AC
  Filled 2018-10-07: qty 2

## 2018-10-07 MED ORDER — THROMBIN 5000 UNITS EX SOLR
CUTANEOUS | Status: AC
  Filled 2018-10-07: qty 5000

## 2018-10-07 MED ORDER — DOCUSATE SODIUM 100 MG PO CAPS
100.0000 mg | ORAL_CAPSULE | Freq: Two times a day (BID) | ORAL | Status: DC
  Administered 2018-10-07 – 2018-10-08 (×2): 100 mg via ORAL
  Filled 2018-10-07 (×2): qty 1

## 2018-10-07 MED ORDER — ROCURONIUM BROMIDE 50 MG/5ML IV SOSY
PREFILLED_SYRINGE | INTRAVENOUS | Status: AC
  Filled 2018-10-07: qty 5

## 2018-10-07 MED ORDER — LACTATED RINGERS IV SOLN
INTRAVENOUS | Status: DC
  Administered 2018-10-07 (×2): via INTRAVENOUS

## 2018-10-07 MED ORDER — SODIUM CHLORIDE 0.9% FLUSH: 3.0000 mL | INTRAVENOUS | Status: DC | PRN

## 2018-10-07 MED ORDER — BUPIVACAINE LIPOSOME 1.3 % IJ SUSP
INTRAMUSCULAR | Status: DC | PRN
  Administered 2018-10-07: 20 mL

## 2018-10-07 MED ORDER — OXYCODONE HCL 5 MG PO TABS
5.0000 mg | ORAL_TABLET | ORAL | Status: DC | PRN
  Administered 2018-10-08: 5 mg via ORAL
  Filled 2018-10-07: qty 1

## 2018-10-07 MED ORDER — CARBIDOPA-LEVODOPA ER 50-200 MG PO TBCR
1.0000 | EXTENDED_RELEASE_TABLET | Freq: Every day | ORAL | Status: DC
  Administered 2018-10-07: 1 via ORAL
  Filled 2018-10-07: qty 1

## 2018-10-07 MED ORDER — THROMBIN (RECOMBINANT) 20000 UNITS EX SOLR
CUTANEOUS | Status: AC
  Filled 2018-10-07: qty 20000

## 2018-10-07 MED ORDER — CYCLOBENZAPRINE HCL 10 MG PO TABS
ORAL_TABLET | ORAL | Status: AC
  Filled 2018-10-07: qty 1

## 2018-10-07 MED ORDER — SUGAMMADEX SODIUM 200 MG/2ML IV SOLN
INTRAVENOUS | Status: DC | PRN
  Administered 2018-10-07: 130 mg via INTRAVENOUS

## 2018-10-07 MED ORDER — SODIUM CHLORIDE 0.9 % IV SOLN
INTRAVENOUS | Status: DC | PRN
  Administered 2018-10-07: 50 ug/min via INTRAVENOUS

## 2018-10-07 MED ORDER — ALBUMIN HUMAN 5 % IV SOLN
INTRAVENOUS | Status: DC | PRN
  Administered 2018-10-07: 13:00:00 via INTRAVENOUS

## 2018-10-07 MED ORDER — ACETAMINOPHEN 325 MG PO TABS: 650.0000 mg | ORAL_TABLET | ORAL | Status: DC | PRN

## 2018-10-07 MED ORDER — FENTANYL CITRATE (PF) 250 MCG/5ML IJ SOLN
INTRAMUSCULAR | Status: AC
  Filled 2018-10-07: qty 5

## 2018-10-07 MED ORDER — SODIUM CHLORIDE 0.9% FLUSH: 3.0000 mL | Freq: Two times a day (BID) | INTRAVENOUS | Status: DC

## 2018-10-07 MED ORDER — CEFAZOLIN SODIUM-DEXTROSE 2-4 GM/100ML-% IV SOLN
2.0000 g | INTRAVENOUS | Status: AC
  Administered 2018-10-07: 2 g via INTRAVENOUS
  Filled 2018-10-07: qty 100

## 2018-10-07 MED ORDER — BACITRACIN ZINC 500 UNIT/GM EX OINT
TOPICAL_OINTMENT | CUTANEOUS | Status: DC | PRN
  Administered 2018-10-07: 1 via TOPICAL

## 2018-10-07 MED ORDER — EPHEDRINE SULFATE-NACL 50-0.9 MG/10ML-% IV SOSY
PREFILLED_SYRINGE | INTRAVENOUS | Status: DC | PRN
  Administered 2018-10-07: 10 mg via INTRAVENOUS

## 2018-10-07 MED ORDER — MENTHOL 3 MG MT LOZG: 1.0000 | LOZENGE | OROMUCOSAL | Status: DC | PRN

## 2018-10-07 MED ORDER — SUGAMMADEX SODIUM 200 MG/2ML IV SOLN
INTRAVENOUS | Status: AC
  Filled 2018-10-07: qty 2

## 2018-10-07 MED ORDER — PROPOFOL 10 MG/ML IV BOLUS
INTRAVENOUS | Status: AC
  Filled 2018-10-07: qty 20

## 2018-10-07 MED ORDER — ACETAMINOPHEN 500 MG PO TABS
1000.0000 mg | ORAL_TABLET | Freq: Four times a day (QID) | ORAL | Status: DC
  Administered 2018-10-07 – 2018-10-08 (×3): 1000 mg via ORAL
  Filled 2018-10-07 (×3): qty 2

## 2018-10-07 MED ORDER — 0.9 % SODIUM CHLORIDE (POUR BTL) OPTIME
TOPICAL | Status: DC | PRN
  Administered 2018-10-07: 1000 mL

## 2018-10-07 MED ORDER — CYCLOBENZAPRINE HCL 10 MG PO TABS
10.0000 mg | ORAL_TABLET | Freq: Three times a day (TID) | ORAL | Status: DC | PRN
  Administered 2018-10-07: 10 mg via ORAL

## 2018-10-07 MED ORDER — ALBUMIN HUMAN 5 % IV SOLN
INTRAVENOUS | Status: AC
  Filled 2018-10-07: qty 250

## 2018-10-07 MED ORDER — OXYCODONE HCL 5 MG PO TABS
10.0000 mg | ORAL_TABLET | ORAL | Status: DC | PRN
  Administered 2018-10-07 (×2): 10 mg via ORAL
  Filled 2018-10-07: qty 2

## 2018-10-07 MED ORDER — SODIUM CHLORIDE 0.9 % IV SOLN: 250.0000 mL | INTRAVENOUS | Status: DC

## 2018-10-07 MED ORDER — VANCOMYCIN HCL 1000 MG IV SOLR
INTRAVENOUS | Status: AC
  Filled 2018-10-07: qty 1000

## 2018-10-07 MED ORDER — ROCURONIUM BROMIDE 50 MG/5ML IV SOSY
PREFILLED_SYRINGE | INTRAVENOUS | Status: DC | PRN
  Administered 2018-10-07: 30 mg via INTRAVENOUS
  Administered 2018-10-07 (×2): 50 mg via INTRAVENOUS

## 2018-10-07 MED ORDER — BISACODYL 10 MG RE SUPP: 10.0000 mg | Freq: Every day | RECTAL | Status: DC | PRN

## 2018-10-07 MED ORDER — MIDAZOLAM HCL 5 MG/5ML IJ SOLN
INTRAMUSCULAR | Status: DC | PRN
  Administered 2018-10-07 (×2): 1 mg via INTRAVENOUS

## 2018-10-07 MED ORDER — SERTRALINE HCL 50 MG PO TABS
100.0000 mg | ORAL_TABLET | Freq: Every day | ORAL | Status: DC
  Administered 2018-10-07 – 2018-10-08 (×2): 100 mg via ORAL
  Filled 2018-10-07 (×2): qty 2

## 2018-10-07 MED ORDER — PHENYLEPHRINE 40 MCG/ML (10ML) SYRINGE FOR IV PUSH (FOR BLOOD PRESSURE SUPPORT)
PREFILLED_SYRINGE | INTRAVENOUS | Status: DC | PRN
  Administered 2018-10-07: 40 ug via INTRAVENOUS
  Administered 2018-10-07: 80 ug via INTRAVENOUS
  Administered 2018-10-07: 20 ug via INTRAVENOUS
  Administered 2018-10-07 (×2): 120 ug via INTRAVENOUS
  Administered 2018-10-07: 160 ug via INTRAVENOUS
  Administered 2018-10-07: 120 ug via INTRAVENOUS

## 2018-10-07 MED ORDER — CEFAZOLIN SODIUM-DEXTROSE 2-4 GM/100ML-% IV SOLN
2.0000 g | Freq: Three times a day (TID) | INTRAVENOUS | Status: AC
  Administered 2018-10-07 – 2018-10-08 (×2): 2 g via INTRAVENOUS
  Filled 2018-10-07 (×2): qty 100

## 2018-10-07 MED ORDER — THROMBIN 5000 UNITS EX SOLR
OROMUCOSAL | Status: DC | PRN
  Administered 2018-10-07 (×3): via TOPICAL

## 2018-10-07 MED ORDER — DULOXETINE HCL 30 MG PO CPEP
60.0000 mg | ORAL_CAPSULE | Freq: Every day | ORAL | Status: DC
  Administered 2018-10-07 – 2018-10-08 (×2): 60 mg via ORAL
  Filled 2018-10-07 (×2): qty 2

## 2018-10-07 MED ORDER — ONDANSETRON HCL 4 MG PO TABS: 4.0000 mg | ORAL_TABLET | Freq: Four times a day (QID) | ORAL | Status: DC | PRN

## 2018-10-07 MED ORDER — ALBUMIN HUMAN 5 % IV SOLN
12.5000 g | Freq: Once | INTRAVENOUS | Status: AC
  Administered 2018-10-07: 12.5 g via INTRAVENOUS

## 2018-10-07 MED ORDER — BACITRACIN ZINC 500 UNIT/GM EX OINT
TOPICAL_OINTMENT | CUTANEOUS | Status: AC
  Filled 2018-10-07: qty 28.35

## 2018-10-07 MED ORDER — BUPIVACAINE-EPINEPHRINE (PF) 0.5% -1:200000 IJ SOLN
INTRAMUSCULAR | Status: DC | PRN
  Administered 2018-10-07: 10 mL via PERINEURAL

## 2018-10-07 MED ORDER — CARBIDOPA-LEVODOPA 25-100 MG PO TABS
1.0000 | ORAL_TABLET | Freq: Every day | ORAL | Status: DC
  Filled 2018-10-07 (×2): qty 1

## 2018-10-07 MED ORDER — HYDROCODONE-ACETAMINOPHEN 10-325 MG PO TABS: 1.0000 | ORAL_TABLET | ORAL | Status: DC | PRN

## 2018-10-07 MED ORDER — ZOLPIDEM TARTRATE 5 MG PO TABS: 5.0000 mg | ORAL_TABLET | Freq: Every evening | ORAL | Status: DC | PRN

## 2018-10-07 MED ORDER — ACETAMINOPHEN 650 MG RE SUPP: 650.0000 mg | RECTAL | Status: DC | PRN

## 2018-10-07 MED ORDER — BUPIVACAINE LIPOSOME 1.3 % IJ SUSP
20.0000 mL | Freq: Once | INTRAMUSCULAR | Status: DC
  Filled 2018-10-07: qty 20

## 2018-10-07 MED ORDER — DEXAMETHASONE SODIUM PHOSPHATE 10 MG/ML IJ SOLN
INTRAMUSCULAR | Status: AC
  Filled 2018-10-07: qty 1

## 2018-10-07 MED ORDER — DEXAMETHASONE SODIUM PHOSPHATE 10 MG/ML IJ SOLN
INTRAMUSCULAR | Status: DC | PRN
  Administered 2018-10-07: 10 mg via INTRAVENOUS

## 2018-10-07 MED ORDER — CARBIDOPA-LEVODOPA 25-100 MG PO TABS
1.0000 | ORAL_TABLET | Freq: Three times a day (TID) | ORAL | Status: DC
  Administered 2018-10-07: 1 via ORAL
  Administered 2018-10-08: 2 via ORAL
  Filled 2018-10-07 (×3): qty 1

## 2018-10-07 MED ORDER — PHENOL 1.4 % MT LIQD: 1.0000 | OROMUCOSAL | Status: DC | PRN

## 2018-10-07 MED ORDER — VANCOMYCIN HCL 1 G IV SOLR
INTRAVENOUS | Status: DC | PRN
  Administered 2018-10-07: 1000 mg

## 2018-10-07 MED ORDER — COENZYME Q10 100 MG PO CAPS: 100.0000 mg | ORAL_CAPSULE | Freq: Every day | ORAL | Status: DC

## 2018-10-07 MED ORDER — CHLORHEXIDINE GLUCONATE CLOTH 2 % EX PADS: 6.0000 | MEDICATED_PAD | Freq: Once | CUTANEOUS | Status: DC

## 2018-10-07 MED ORDER — ATORVASTATIN CALCIUM 20 MG PO TABS
10.0000 mg | ORAL_TABLET | Freq: Every day | ORAL | Status: DC
  Administered 2018-10-07: 10 mg via ORAL
  Filled 2018-10-07: qty 1

## 2018-10-07 MED ORDER — SODIUM CHLORIDE 0.9 % IV SOLN
INTRAVENOUS | Status: DC | PRN
  Administered 2018-10-07: 10:00:00

## 2018-10-07 MED ORDER — SODIUM CHLORIDE 0.9 % IV SOLN
INTRAVENOUS | Status: DC | PRN
  Administered 2018-10-07: 14:00:00 via INTRAVENOUS

## 2018-10-07 MED ORDER — MORPHINE SULFATE (PF) 4 MG/ML IV SOLN: 4.0000 mg | INTRAVENOUS | Status: DC | PRN

## 2018-10-07 MED ORDER — FENTANYL CITRATE (PF) 100 MCG/2ML IJ SOLN
25.0000 ug | INTRAMUSCULAR | Status: DC | PRN
  Administered 2018-10-07: 50 ug via INTRAVENOUS

## 2018-10-07 MED ORDER — FENTANYL CITRATE (PF) 100 MCG/2ML IJ SOLN
INTRAMUSCULAR | Status: DC | PRN
  Administered 2018-10-07: 50 ug via INTRAVENOUS
  Administered 2018-10-07: 150 ug via INTRAVENOUS
  Administered 2018-10-07 (×2): 50 ug via INTRAVENOUS

## 2018-10-07 MED ORDER — ONDANSETRON HCL 4 MG/2ML IJ SOLN: 4.0000 mg | Freq: Four times a day (QID) | INTRAMUSCULAR | Status: DC | PRN

## 2018-10-07 SURGICAL SUPPLY — 69 items
ADH SKN CLS APL DERMABOND .7 (GAUZE/BANDAGES/DRESSINGS) ×1
APL SKNCLS STERI-STRIP NONHPOA (GAUZE/BANDAGES/DRESSINGS) ×1
BAG DECANTER FOR FLEXI CONT (MISCELLANEOUS) ×3 IMPLANT
BENZOIN TINCTURE PRP APPL 2/3 (GAUZE/BANDAGES/DRESSINGS) ×3 IMPLANT
BLADE CLIPPER SURG (BLADE) IMPLANT
BUR MATCHSTICK NEURO 3.0 LAGG (BURR) ×3 IMPLANT
BUR PRECISION FLUTE 6.0 (BURR) ×3 IMPLANT
CAGE ALTERA 10X31MM-10-14-15 (Cage) ×1 IMPLANT
CAGE ALTERA 10X31X10-14 15D (Cage) ×1 IMPLANT
CANISTER SUCT 3000ML PPV (MISCELLANEOUS) ×3 IMPLANT
CAP REVERE LOCKING (Cap) ×8 IMPLANT
CARTRIDGE OIL MAESTRO DRILL (MISCELLANEOUS) ×1 IMPLANT
CLOSURE WOUND 1/2 X4 (GAUZE/BANDAGES/DRESSINGS) ×1
CONT SPEC 4OZ CLIKSEAL STRL BL (MISCELLANEOUS) ×3 IMPLANT
COVER BACK TABLE 60X90IN (DRAPES) ×3 IMPLANT
COVER WAND RF STERILE (DRAPES) ×3 IMPLANT
DECANTER SPIKE VIAL GLASS SM (MISCELLANEOUS) ×3 IMPLANT
DERMABOND ADVANCED (GAUZE/BANDAGES/DRESSINGS) ×2
DERMABOND ADVANCED .7 DNX12 (GAUZE/BANDAGES/DRESSINGS) IMPLANT
DIFFUSER DRILL AIR PNEUMATIC (MISCELLANEOUS) ×3 IMPLANT
DRAPE C-ARM 42X72 X-RAY (DRAPES) ×6 IMPLANT
DRAPE HALF SHEET 40X57 (DRAPES) ×5 IMPLANT
DRAPE LAPAROTOMY 100X72X124 (DRAPES) ×3 IMPLANT
DRAPE SURG 17X23 STRL (DRAPES) ×12 IMPLANT
DRSG OPSITE 4X5.5 SM (GAUZE/BANDAGES/DRESSINGS) ×2 IMPLANT
ELECT BLADE 4.0 EZ CLEAN MEGAD (MISCELLANEOUS) ×3
ELECT REM PT RETURN 9FT ADLT (ELECTROSURGICAL) ×3
ELECTRODE BLDE 4.0 EZ CLN MEGD (MISCELLANEOUS) ×1 IMPLANT
ELECTRODE REM PT RTRN 9FT ADLT (ELECTROSURGICAL) ×1 IMPLANT
EVACUATOR 1/8 PVC DRAIN (DRAIN) IMPLANT
GAUZE 4X4 16PLY RFD (DISPOSABLE) ×5 IMPLANT
GAUZE SPONGE 4X4 12PLY STRL (GAUZE/BANDAGES/DRESSINGS) ×3 IMPLANT
GLOVE BIO SURGEON STRL SZ8 (GLOVE) ×6 IMPLANT
GLOVE BIO SURGEON STRL SZ8.5 (GLOVE) ×6 IMPLANT
GLOVE EXAM NITRILE XL STR (GLOVE) IMPLANT
GOWN STRL REUS W/ TWL LRG LVL3 (GOWN DISPOSABLE) IMPLANT
GOWN STRL REUS W/ TWL XL LVL3 (GOWN DISPOSABLE) ×2 IMPLANT
GOWN STRL REUS W/TWL 2XL LVL3 (GOWN DISPOSABLE) IMPLANT
GOWN STRL REUS W/TWL LRG LVL3 (GOWN DISPOSABLE) ×6
GOWN STRL REUS W/TWL XL LVL3 (GOWN DISPOSABLE) ×6
HEMOSTAT POWDER KIT SURGIFOAM (HEMOSTASIS) ×7 IMPLANT
KIT BASIN OR (CUSTOM PROCEDURE TRAY) ×3 IMPLANT
KIT TURNOVER KIT B (KITS) ×3 IMPLANT
MILL MEDIUM DISP (BLADE) ×1 IMPLANT
NDL HYPO 21X1.5 SAFETY (NEEDLE) IMPLANT
NEEDLE HYPO 21X1.5 SAFETY (NEEDLE) ×3 IMPLANT
NEEDLE HYPO 22GX1.5 SAFETY (NEEDLE) ×3 IMPLANT
NS IRRIG 1000ML POUR BTL (IV SOLUTION) ×3 IMPLANT
OIL CARTRIDGE MAESTRO DRILL (MISCELLANEOUS) ×3
PACK LAMINECTOMY NEURO (CUSTOM PROCEDURE TRAY) ×3 IMPLANT
PAD ARMBOARD 7.5X6 YLW CONV (MISCELLANEOUS) ×9 IMPLANT
PATTIES SURGICAL .5 X1 (DISPOSABLE) IMPLANT
PATTIES SURGICAL 1X1 (DISPOSABLE) ×2 IMPLANT
ROD REVERE 6.35 40MM (Rod) ×4 IMPLANT
SCREW 7.5X50MM (Screw) ×4 IMPLANT
SCREW REVERE 6.35 7.5X40 (Screw) ×4 IMPLANT
SPONGE LAP 4X18 RFD (DISPOSABLE) IMPLANT
SPONGE NEURO XRAY DETECT 1X3 (DISPOSABLE) IMPLANT
SPONGE SURGIFOAM ABS GEL 100 (HEMOSTASIS) IMPLANT
STRIP BIOACTIVE 20CC 25X100X8 (Miscellaneous) ×2 IMPLANT
STRIP CLOSURE SKIN 1/2X4 (GAUZE/BANDAGES/DRESSINGS) ×2 IMPLANT
SUT VIC AB 1 CT1 18XBRD ANBCTR (SUTURE) ×2 IMPLANT
SUT VIC AB 1 CT1 8-18 (SUTURE) ×6
SUT VIC AB 2-0 CP2 18 (SUTURE) ×6 IMPLANT
SYR 20CC LL (SYRINGE) ×2 IMPLANT
TOWEL GREEN STERILE (TOWEL DISPOSABLE) ×3 IMPLANT
TOWEL GREEN STERILE FF (TOWEL DISPOSABLE) ×3 IMPLANT
TRAY FOLEY MTR SLVR 16FR STAT (SET/KITS/TRAYS/PACK) ×3 IMPLANT
WATER STERILE IRR 1000ML POUR (IV SOLUTION) ×3 IMPLANT

## 2018-10-07 NOTE — H&P (Signed)
Subjective: The patient is a 72 year old white female who is complained of back and bilateral pain, numbness, tingling and weakness.  She has failed medical management and was worked up with a lumbar MRI and lumbar x-rays.  This demonstrated an L5-S1 spinal listhesis with severe stenosis.  I discussed the various treatment options with the patient including surgery.  She has weighed the risks, benefits, and alternatives surgery and decided to proceed with a L5-S1 decompression, instrumentation and fusion.  Past Medical History:  Diagnosis Date  . Anxiety   . Arthritis   . Hyperlipemia   . Irritable bowel syndrome   . Parkinson disease ([place])   . Restless leg syndrome   . RSD (reflex sympathetic dystrophy)    pt denies    Past Surgical History:  Procedure Laterality Date  . APPENDECTOMY    . BREAST BIOPSY Left 08/16/2013   neg  . CATARACT EXTRACTION Left   . COLONOSCOPY    . COLONOSCOPY WITH PROPOFOL N/A 06/20/2015   Procedure: COLONOSCOPY WITH PROPOFOL;  Surgeon: Lollie Sails, MD;  Location: [facility] ENDOSCOPY;  Service: Endoscopy;  Laterality: N/A;  . EYE SURGERY     Cataract in one eye  . FRACTURE SURGERY     wrist - right  . PARS PLANA VITRECTOMY Left 2010    Allergies  Allergen Reactions  . Pseudoephedrine Hcl Anxiety and Other (See Comments)    Nervous  . Sudafed [Pseudoephedrine] Anxiety and Other (See Comments)    Nervous    Social History   Tobacco Use  . Smoking status: Never Smoker  . Smokeless tobacco: Never Used  Substance Use Topics  . Alcohol use: Yes    Comment: 1-2 x a week    Family History  Problem Relation Age of Onset  . Breast cancer Mother 41  . Diabetes Mother   . Cancer Mother   . Heart disease Mother   . Alzheimer's disease Father   . Heart disease Maternal Aunt    Prior to Admission medications   Medication Sig Start Date End Date Taking? Authorizing Provider  acetaminophen (TYLENOL) 500 MG tablet Take 1,000 mg by mouth every 6 (six)  hours as needed for moderate pain or headache.    Yes [provider]  atorvastatin (LIPITOR) 10 MG tablet TAKE 1 TABLET(10 MG) BY MOUTH AT BEDTIME 09/30/18  Yes [name], MD  carbidopa-levodopa (SINEMET CR) 50-200 MG tablet Take 1 tablet by mouth at bedtime. 08/24/18  Yes Tat, Eustace Quail, DO  carbidopa-levodopa (SINEMET IR) 25-100 MG tablet 2 in the morning, 1 in the afternoon, 1 in the evening Patient taking differently: Take 1-2 tablets by mouth See admin instructions. 2 in the morning, 1 in the afternoon, 1 in the evening 08/24/18  Yes Tat, Eustace Quail, DO  Coenzyme Q10 100 MG capsule Take 100 mg by mouth daily.    Yes [provider]  cyclobenzaprine (FLEXERIL) 5 MG tablet TAKE 1 TABLET BY MOUTH EVERY 8 HOURS AS NEEDED FOR MUSCLE SPAMS Patient taking differently: Take 5 mg by mouth 3 (three) times daily as needed for muscle spasms.  09/07/18  Yes [name], MD  docusate sodium (COLACE) 100 MG capsule Take 2 capsules (200 mg total) by mouth at bedtime. Patient taking differently: Take 200 mg by mouth daily as needed for mild constipation.  06/11/18  Yes [name], MD  DULoxetine (CYMBALTA) 60 MG capsule Take 60 mg by mouth daily.    Yes [provider]  HYDROcodone-acetaminophen (  NORCO) 10-325 MG tablet Take 1 tablet by mouth every 6 (six) hours as needed. Patient taking differently: Take 1 tablet by mouth every 6 (six) hours as needed for moderate pain.  09/11/18  Yes [name], MD  meloxicam (MOBIC) 15 MG tablet TAKE 1 TABLET BY MOUTH EVERY DAY AS NEEDED FOR MUSCLE SPAMS Patient taking differently: Take 15 mg by mouth daily as needed for pain.  08/03/18  Yes [name], MD  Omega-3 Fatty Acids (FISH OIL) 1000 MG CAPS Take 1,000 mg by mouth daily.    Yes [provider]  sertraline (ZOLOFT) 100 MG tablet Take 100 mg by mouth daily.   Yes [provider]  vitamin B-12 (CYANOCOBALAMIN) 250 MCG tablet Take 250 mcg by mouth daily.    Yes [provider]  LYRICA 75 MG capsule TAKE 1 CAPSULE(75 MG) BY MOUTH TWICE DAILY Patient not taking: No sig reported 09/03/18   [name], MD     Review of Systems  Positive ROS: As above  All other systems have been reviewed and were otherwise negative with the exception of those mentioned in the HPI and as above.  Objective: Vital signs in last 24 hours: Temp:  [97.8 F (36.6 C)] 97.8 F (36.6 C) (11/13 0831) Pulse Rate:  [96] 96 (11/13 0831) Resp:  [18] 18 (11/13 0831) BP: (127)/(80) 127/80 (11/13 0832) SpO2:  [97 %] 97 % (11/13 0831) Estimated body mass index is 22.32 kg/m as calculated from the following:   Height as of 09/28/18: 5\' 6"  (1.676 m).   Weight as of 09/28/18: 62.7 kg.   General Appearance: Alert Head: Normocephalic, without obvious abnormality, atraumatic Eyes: PERRL, conjunctiva/corneas clear, EOM's intact,    Ears: Normal  Throat: Normal  Neck: Supple, Back: unremarkable Lungs: Clear to auscultation bilaterally, respirations unlabored Heart: Regular rate and rhythm, no murmur, rub or gallop Abdomen: Soft, non-tender Extremities: Extremities normal, atraumatic, no cyanosis or edema Skin: unremarkable  NEUROLOGIC:   Mental status: alert and oriented,Motor Exam - grossly normal Sensory Exam -she has bilateral feet numbness. Reflexes:  Coordination - grossly normal Gait - grossly normal Balance - grossly normal Cranial Nerves: I: smell Not tested  II: visual acuity  OS: Normal  OD: Normal   II: visual fields Full to confrontation  II: pupils Equal, round, reactive to light  III,VII: ptosis None  III,IV,VI: extraocular muscles  Full ROM  V: mastication Normal  V: facial light touch sensation  Normal  V,VII: corneal reflex  Present  VII: facial muscle function - upper  Normal  VII: facial muscle function - lower Normal  VIII: hearing Not tested  IX: soft palate elevation  Normal  IX,X: gag reflex Present  XI: trapezius  strength  5/5  XI: sternocleidomastoid strength 5/5  XI: neck flexion strength  5/5  XII: tongue strength  Normal    Data Review Lab Results  Component Value Date   WBC 7.4 09/28/2018   HGB 13.7 09/28/2018   HCT 44.1 09/28/2018   MCV 92.8 09/28/2018   PLT 246 09/28/2018   Lab Results  Component Value Date   NA 137 09/28/2018   K 4.7 09/28/2018   CL 106 09/28/2018   CO2 28 09/28/2018   BUN 14 09/28/2018   CREATININE 0.70 09/28/2018   GLUCOSE 129 (H) 09/28/2018   No results found for: INR, PROTIME  Assessment/Plan: L5-S1 spondylolisthesis, spinal stenosis, lumbago, lumbar radiculopathy, neurogenic claudication: I have discussed the situation with the patient and her husband  and reviewed the imaging studies with him.  We have discussed the various treatment options including surgery.  I have described the surgical treatment option of an L5-S1 decompression, instrumentation, and fusion.  I have shown her surgical models.  I have given her a surgical pamphlet.  We have discussed the risks, benefits, alternatives, expected postoperative course and likelihood of achieving our goals with surgery.  I have answered all her questions.  She has decided to proceed with surgery.   Ophelia Charter 10/07/2018 10:27 AM

## 2018-10-07 NOTE — Anesthesia Preprocedure Evaluation (Signed)
Anesthesia Evaluation  Patient identified by MRN, date of birth, ID band Patient awake    Reviewed: Allergy & Precautions, NPO status , Patient's Chart, lab work & pertinent test results  Airway Mallampati: II  TM Distance: >3 FB     Dental   Pulmonary neg pulmonary ROS,    breath sounds clear to auscultation       Cardiovascular negative cardio ROS   Rhythm:Regular Rate:Normal     Neuro/Psych    GI/Hepatic negative GI ROS, Neg liver ROS,   Endo/Other  negative endocrine ROS  Renal/GU negative Renal ROS     Musculoskeletal  (+) Arthritis ,   Abdominal   Peds  Hematology   Anesthesia Other Findings   Reproductive/Obstetrics                             Anesthesia Physical Anesthesia Plan  ASA: II  Anesthesia Plan: General   Post-op Pain Management:    Induction: Intravenous  PONV Risk Score and Plan: 3 and Treatment may vary due to age or medical condition, Ondansetron, Dexamethasone and Midazolam  Airway Management Planned:   Additional Equipment:   Intra-op Plan:   Post-operative Plan: Extubation in OR  Informed Consent: I have reviewed the patients History and Physical, chart, labs and discussed the procedure including the risks, benefits and alternatives for the proposed anesthesia with the patient or authorized representative who has indicated his/her understanding and acceptance.   Dental advisory given  Plan Discussed with: CRNA and Anesthesiologist  Anesthesia Plan Comments:         Anesthesia Quick Evaluation

## 2018-10-07 NOTE — Progress Notes (Signed)
PHARMACIST - PHYSICIAN ORDER COMMUNICATION  CONCERNING: P&T Medication Policy on Herbal Medications  DESCRIPTION:  This patient's order for:  Coenzyme Q 10  has been noted.  This product(s) is classified as an "herbal" or natural product. Due to a lack of definitive safety studies or FDA approval, nonstandard manufacturing practices, plus the potential risk of unknown drug-drug interactions while on inpatient medications, the Pharmacy and Therapeutics Committee does not permit the use of "herbal" or natural products of this type within [facility].   ACTION TAKEN: The pharmacy department is unable to verify this order at this time and your patient has been informed of this safety policy. Please reevaluate patient's clinical condition at discharge and address if the herbal or natural product(s) should be resumed at that time.  Cythia Bachtel A. Levada Dy, PharmD, [place] Pager: [phone] Please utilize Amion for appropriate phone number to reach the unit pharmacist ([place])

## 2018-10-07 NOTE — Progress Notes (Signed)
Subjective: The patient is somnolent but arousable.  She is in no apparent distress.  Objective: Vital signs in last 24 hours: Temp:  [97.8 F (36.6 C)-98 F (36.7 C)] 98 F (36.7 C) (11/13 1510) Pulse Rate:  [96-107] 103 (11/13 1515) Resp:  [13-18] 13 (11/13 1515) BP: (100-127)/(76-80) 100/76 (11/13 1510) SpO2:  [97 %-98 %] 98 % (11/13 1515) Estimated body mass index is 22.32 kg/m as calculated from the following:   Height as of 09/28/18: 5\' 6"  (1.676 m).   Weight as of 09/28/18: 62.7 kg.   Intake/Output from previous day: No intake/output data recorded. Intake/Output this shift: Total I/O In: 2160 [I.V.:1750; Blood:160; IV Piggyback:250] Out: 1400 [Urine:800; Blood:600]  Physical exam the patient is somnolent but arousable.  She is moving her lower extremities well.  Lab Results: No results for input(s): WBC, HGB, HCT, PLT in the last 72 hours. BMET No results for input(s): NA, K, CL, CO2, GLUCOSE, BUN, CREATININE, CALCIUM in the last 72 hours.  Studies/Results: Dg Lumbar Spine 2-3 Views  Result Date: 10/07/2018 CLINICAL DATA:  Intraoperative fluoroscopic guidance for L5-S1 PLIF. EXAM: DG C-ARM 61-120 MIN; LUMBAR SPINE - 2-3 VIEW COMPARISON:  Prior radiograph from earlier the same day. FINDINGS: Spot intraoperative AP and lateral fluoroscopic views of the lumbosacral spine mild from L5-S1 PLIF are seen. Placement of bilateral transpedicular screws with interbody device. Hardware appears well positioned without adverse features. Fluoroscopic time = 47 seconds. IMPRESSION: Intraoperative fluoroscopic localization for L5-S1 PLIF. Electronically Signed   By: Jeannine Boga M.D.   On: 10/07/2018 14:49   Dg Lumbar Spine 1 View  Result Date: 10/07/2018 CLINICAL DATA:  Localization radiograph film 2 for lumbar surgical procedure. EXAM: LUMBAR SPINE - 1 VIEW COMPARISON:  Portable lateral lumbar spine image film 1 at 11:33 a.m. today. FINDINGS: The single cross-table lateral  view interpreted is timed at 12:08 p.m. The metallic trocar projects along the posterosuperior margin of the body of L5 just inferior to the disc space. IMPRESSION: Film 2 lateral localization image revealing the metallic trocar lie along the posterosuperior margin of the body of L5. Electronically Signed   By: David  Martinique M.D.   On: 10/07/2018 12:58   Dg Lumbar Spine 1 View  Result Date: 10/07/2018 CLINICAL DATA:  Surgical posterior fusion of lumbar spine. EXAM: LUMBAR SPINE - 1 VIEW COMPARISON:  MRI of August 02, 2018. FINDINGS: Single intraoperative cross-table lateral projection of the lumbar spine demonstrates surgical probe directed toward posterior elements of L5. IMPRESSION: Surgical localization as described above. Electronically Signed   By: Marijo Conception, M.D.   On: 10/07/2018 11:53   Dg C-arm 1-60 Min  Result Date: 10/07/2018 CLINICAL DATA:  Intraoperative fluoroscopic guidance for L5-S1 PLIF. EXAM: DG C-ARM 61-120 MIN; LUMBAR SPINE - 2-3 VIEW COMPARISON:  Prior radiograph from earlier the same day. FINDINGS: Spot intraoperative AP and lateral fluoroscopic views of the lumbosacral spine mild from L5-S1 PLIF are seen. Placement of bilateral transpedicular screws with interbody device. Hardware appears well positioned without adverse features. Fluoroscopic time = 47 seconds. IMPRESSION: Intraoperative fluoroscopic localization for L5-S1 PLIF. Electronically Signed   By: Jeannine Boga M.D.   On: 10/07/2018 14:49    Assessment/Plan: The patient is doing well.  I spoke with her husband.  LOS: 0 days     Ophelia Charter 10/07/2018, 3:30 PM

## 2018-10-07 NOTE — Transfer of Care (Signed)
Immediate Anesthesia Transfer of Care Note  Patient: Deborah Velazquez  Procedure(s) Performed: POSTERIOR LUMBAR INTERBODY FUSION, POSTERIOR INSTRUMENTATION LUMBAR FIVE-SACRAL ONE (N/A )  Patient Location: PACU  Anesthesia Type:General  Level of Consciousness: drowsy and patient cooperative  Airway & Oxygen Therapy: Patient Spontanous Breathing and Patient connected to face mask oxygen  Post-op Assessment: Report given to RN and Post -op Vital signs reviewed and stable  Post vital signs: Reviewed and stable  Last Vitals:  Vitals Value Taken Time  BP 100/76 10/07/2018  3:11 PM  Temp    Pulse 103 10/07/2018  3:13 PM  Resp 27 10/07/2018  3:13 PM  SpO2 99 % 10/07/2018  3:13 PM  Vitals shown include unvalidated device data.  Last Pain:  Vitals:   10/07/18 0847  TempSrc:   PainSc: 10-Worst pain ever         Complications: No apparent anesthesia complications

## 2018-10-07 NOTE — Patient Outreach (Signed)
[facility]) Care Management  10/07/2018  Deborah Velazquez [birth year] [id number]  Care coordination:  ADT notification: patient admitted to [facility] [place].   [facility] care management hospital liaisons notified of patients admission.   PLAN: RNCM will follow for patients discharge disposition  Quinn Plowman RN,BSN,CCM [facility] Telephonic  [phone] '

## 2018-10-07 NOTE — Anesthesia Postprocedure Evaluation (Signed)
Anesthesia Post Note  Patient: Deborah Velazquez  Procedure(s) Performed: POSTERIOR LUMBAR INTERBODY FUSION, POSTERIOR INSTRUMENTATION LUMBAR FIVE-SACRAL ONE (N/A )     Patient location during evaluation: PACU Anesthesia Type: General Level of consciousness: awake Pain management: pain level controlled Vital Signs Assessment: post-procedure vital signs reviewed and stable Respiratory status: spontaneous breathing Cardiovascular status: stable Postop Assessment: no apparent nausea or vomiting Anesthetic complications: no    Last Vitals:  Vitals:   10/07/18 1535 10/07/18 1540  BP:  (!) 87/52  Pulse: 94 93  Resp: 10 10  Temp:    SpO2: 96% 95%    Last Pain:  Vitals:   10/07/18 1530  TempSrc:   PainSc: 8                  Bernabe Dorce

## 2018-10-07 NOTE — Anesthesia Procedure Notes (Signed)
Procedure Name: Intubation Date/Time: 10/07/2018 10:47 AM Performed by: Lance Coon, CRNA Pre-anesthesia Checklist: Patient identified, Suction available, Emergency Drugs available, Patient being monitored and Timeout performed Patient Re-evaluated:Patient Re-evaluated prior to induction Oxygen Delivery Method: Circle system utilized Preoxygenation: Pre-oxygenation with 100% oxygen Induction Type: IV induction Ventilation: Mask ventilation without difficulty Laryngoscope Size: Miller and 2 Grade View: Grade I Tube type: Oral Tube size: 7.0 mm Number of attempts: 1 Airway Equipment and Method: Stylet Placement Confirmation: ETT inserted through vocal cords under direct vision,  positive ETCO2 and breath sounds checked- equal and bilateral Secured at: 21 cm Tube secured with: Tape Dental Injury: Teeth and Oropharynx as per pre-operative assessment

## 2018-10-07 NOTE — Op Note (Signed)
Brief history: The patient is a 72 year old white female who has complained of back and leg pain, numbness and weakness.  She has failed medical management and was worked up with a lumbar MRI and lumbar x-rays.  She has a transitional vertebrae which has been called S1.  At L5-S1 the patient has a grade 1 spinal listhesis with severe stenosis.  I discussed the various treatment options with the patient treating surgery.  She has weighed the risks, benefits and alternatives of surgery and decided proceed with an L5-S1 decompression, nstrumentation and fusion.  Preoperative diagnosis: L5-S1 spondylolisthesis degenerative disc disease, spinal stenosis compressing both the L5 and the S1 nerve roots; lumbago; lumbar radiculopathy; neurogenic claudication  Postoperative diagnosis: The same  Procedure: Bilateral L5-S1 laminotomy/foraminotomies/medial facetectomy to decompress the bilateral L5 and S1 nerve roots(the work required to do this was in addition to the work required to do the posterior lumbar interbody fusion because of the patient's spinal stenosis, facet arthropathy. Etc. requiring a wide decompression of the nerve roots.);  L5-S1 transforaminal lumbar interbody fusion with local morselized autograft bone and Kinnex graft extender; insertion of interbody prosthesis at L5-S1 (globus peek expandable interbody prosthesis); posterior nonsegmental instrumentation from L5 to S1 with globus titanium pedicle screws and rods; posterior lateral arthrodesis at L5-S1 with local morselized autograft bone and Kinnex bone graft extender.  Surgeon: Dr. Earle Gell  Asst.: Arnetha Massy nurse practitioner  Anesthesia: Gen. endotracheal  Estimated blood loss: 600 cc  Drains: One medium Hemovac in the epidural space  Complications: None  Description of procedure: The patient was brought to the operating room by the anesthesia team. General endotracheal anesthesia was induced. The patient was turned to the  prone position on the Wilson frame. The patient's lumbosacral region was then prepared with Betadine scrub and Betadine solution. Sterile drapes were applied.  I then injected the area to be incised with Marcaine with epinephrine solution. I then used the scalpel to make a linear midline incision over the L5-S1 interspace. I then used electrocautery to perform a bilateral subperiosteal dissection exposing the spinous process and lamina of L4, L5 and S1. We then obtained intraoperative radiograph to confirm our location. We then inserted the Verstrac retractor to provide exposure.  I then used a high speed drill to perform bilateral foraminotomies at L5-S1.  We then used the Kerrison punches to widen the laminotomy and removed the ligamentum flavum at L5-S1 bilaterally. We used the Kerrison punches to remove the medial facets at L5-S1 bilaterally. We performed wide foraminotomies about the bilateral L5 and S1 nerve roots completing the decompression.  We now turned our attention to the posterior lumbar interbody fusion. I used a scalpel to incise the intervertebral disc at 5 S1 bilaterally. I then performed a partial intervertebral discectomy at L5-S1 bilaterally using the pituitary forceps. We prepared the vertebral endplates at W4-X3 bilaterally for the fusion by removing the soft tissues with the curettes. We then used the trial spacers to pick the appropriate sized interbody prosthesis. We prefilled his prosthesis with a combination of local morselized autograft bone that we obtained during the decompression as well as Kinnex bone graft extender. We inserted the prefilled prosthesis into the interspace at L5-S1 from the right, we then turned and expanded the prosthesis. There was a good snug fit of the prosthesis in the interspace. We then filled and the remainder of the intervertebral disc space with local morselized autograft bone and Kinnex. This completed the posterior lumbar interbody  arthrodesis.  We now  turned attention to the instrumentation. Under fluoroscopic guidance we cannulated the bilateral L5 and S1 pedicles with the bone probe. We then removed the bone probe. We then tapped the pedicle with a 6.5 millimeter tap. We then removed the tap. We probed inside the tapped pedicle with a ball probe to rule out cortical breaches. We then inserted a 7.5 x 40 and 50 millimeter pedicle screw into the L5 and S1 pedicles bilaterally under fluoroscopic guidance. We then palpated along the medial aspect of the pedicles to rule out cortical breaches. There were none. The nerve roots were not injured. We then connected the unilateral pedicle screws with a lordotic rod. We compressed the construct and secured the rod in place with the caps. We then tightened the caps appropriately. This completed the instrumentation from L5-S1 bilaterally.  We now turned our attention to the posterior lateral arthrodesis at L5-S1 bilaterally. We used the high-speed drill to decorticate the remainder of the facets, pars, transverse process at L5-S1 bilaterally. We then applied a combination of local morselized autograft bone and Kinnex bone graft extender over these decorticated posterior lateral structures. This completed the posterior lateral arthrodesis.  We then obtained hemostasis using bipolar electrocautery.  We encountered more than average epidural bleeding throughout the case.  We irrigated the wound out with bacitracin solution. We inspected the thecal sac and nerve roots and noted they were well decompressed. We then removed the retractor.  We placed a medium Hemovac drain in the epidural space and tunneled it out through a separate stab wound.  We placed vancomycin powder in the wound.  We injected Exparel . We reapproximated patient's thoracolumbar fascia with interrupted #1 Vicryl suture. We reapproximated patient's subcutaneous tissue with interrupted 2-0 Vicryl suture. The reapproximated patient's  skin with Steri-Strips and benzoin. The wound was then coated with bacitracin ointment. A sterile dressing was applied. The drapes were removed. The patient was subsequently returned to the supine position where they were extubated by the anesthesia team. He was then transported to the post anesthesia care unit in stable condition. All sponge instrument and needle counts were reportedly correct at the end of this case.

## 2018-10-08 LAB — BASIC METABOLIC PANEL
ANION GAP: 7 (ref 5–15)
BUN: 8 mg/dL (ref 8–23)
CALCIUM: 9.2 mg/dL (ref 8.9–10.3)
CO2: 27 mmol/L (ref 22–32)
Chloride: 105 mmol/L (ref 98–111)
Creatinine, Ser: 0.85 mg/dL (ref 0.44–1.00)
GFR calc Af Amer: >60 mL/min (ref >60)
GFR calc non Af Amer: >60 mL/min (ref >60)
GLUCOSE: 143 mg/dL — AB (ref 70–99)
Potassium: 3.9 mmol/L (ref 3.5–5.1)
Sodium: 139 mmol/L (ref 135–145)

## 2018-10-08 LAB — CBC
HEMATOCRIT: 32.4 % — AB (ref 36.0–46.0)
Hemoglobin: 10.4 g/dL — ABNORMAL LOW (ref 12.0–15.0)
MCH: 29.7 pg (ref 26.0–34.0)
MCHC: 32.1 g/dL (ref 30.0–36.0)
MCV: 92.6 fL (ref 80.0–100.0)
Platelets: 181 10*3/uL (ref 150–400)
RBC: 3.5 MIL/uL — ABNORMAL LOW (ref 3.87–5.11)
RDW: 13.2 % (ref 11.5–15.5)
WBC: 12.9 10*3/uL — ABNORMAL HIGH (ref 4.0–10.5)
nRBC: 0 % (ref 0.0–0.2)

## 2018-10-08 MED ORDER — CYCLOBENZAPRINE HCL 10 MG PO TABS: 10.0000 mg | ORAL_TABLET | Freq: Three times a day (TID) | ORAL | 0 refills | Status: DC | PRN

## 2018-10-08 MED ORDER — DOCUSATE SODIUM 100 MG PO CAPS: 100.0000 mg | ORAL_CAPSULE | Freq: Two times a day (BID) | ORAL | 0 refills | Status: DC

## 2018-10-08 MED ORDER — CARBIDOPA-LEVODOPA 25-100 MG PO TABS
1.0000 | ORAL_TABLET | Freq: Two times a day (BID) | ORAL | Status: DC
  Filled 2018-10-08 (×2): qty 1

## 2018-10-08 MED ORDER — CARBIDOPA-LEVODOPA 25-100 MG PO TABS
2.0000 | ORAL_TABLET | Freq: Every day | ORAL | Status: DC
  Filled 2018-10-08: qty 2

## 2018-10-08 MED ORDER — OXYCODONE HCL 5 MG PO TABS: 5.0000 mg | ORAL_TABLET | ORAL | 0 refills | Status: DC | PRN

## 2018-10-08 NOTE — Evaluation (Signed)
Physical Therapy Evaluation Patient Details Name: Deborah Velazquez MRN: [id number] DOB: 11/10/1946 Today's Date: 10/08/2018   History of Present Illness  Bilateral L5-S1 laminotomy/foraminotomies/medial facetectomy to decompress the bilateral L5 and S1 nerve roots and fusion  Clinical Impression  Pt admitted with above diagnosis. Pt currently with functional limitations due to the deficits listed below (see PT Problem List). At the time of PT eval pt was able to perform transfers and ambulation with gross min guard assist for balance support and safety. Pt reports feeling significant improvement in LE's since surgery. Pt was educated on brace application/wearing schedule, car transfer, precautions, and safe activity progression. Pt will benefit from skilled PT to increase their independence and safety with mobility to allow discharge to the venue listed below.       Follow Up Recommendations No PT follow up;Supervision for mobility/OOB    Equipment Recommendations  None recommended by PT    Recommendations for Other Services       Precautions / Restrictions Precautions Precautions: Back Precaution Booklet Issued: Yes (comment) Precaution Comments: Reviewed handout with pt and she was cued for precautions during functional mobility.  Required Braces or Orthoses: Spinal Brace Spinal Brace: Lumbar corset;Applied in sitting position Restrictions Weight Bearing Restrictions: No      Mobility  Bed Mobility Overal bed mobility: Needs Assistance Bed Mobility: Rolling;Sidelying to Sit;Sit to Sidelying Rolling: Supervision Sidelying to sit: Supervision     Sit to sidelying: Supervision General bed mobility comments: HOB Flat and no rail  Transfers Overall transfer level: Needs assistance Equipment used: Rolling walker (2 wheeled) Transfers: Sit to/from Stand Sit to Stand: Supervision         General transfer comment: VC's for hand placement on seated surface for safety. No  assist required to power-up to full stand however increased time required and hands-on guarding provided for safety.   Ambulation/Gait Ambulation/Gait assistance: Min guard Gait Distance (Feet): 200 Feet Assistive device: Rolling walker (2 wheeled) Gait Pattern/deviations: Decreased stride length;Narrow base of support;Shuffle Gait velocity: Decreased Gait velocity interpretation: 1.31 - 2.62 ft/sec, indicative of limited community ambulator General Gait Details: Noted a narrow BOS with shuffling gait and quick cadence. Pt reports feeling fatigued during gait training. No assist required but min guard assist provided throughout for safety and balance support.   Stairs            Wheelchair Mobility    Modified Rankin (Stroke Patients Only)       Balance Overall balance assessment: Mild deficits observed, not formally tested                                           Pertinent Vitals/Pain Pain Assessment: 0-10 Pain Score: 3  Pain Location: incisional Pain Descriptors / Indicators: Sore Pain Intervention(s): Limited activity within patient's tolerance;Monitored during session;Repositioned    Home Living Family/patient expects to be discharged to:: Private residence Living Arrangements: Spouse/significant other Available Help at Discharge: Family Type of Home: House Home Access: Stairs to enter Entrance Stairs-Rails: Right Entrance Stairs-Number of Steps: 2 Home Layout: One level Home Equipment: Environmental consultant - 2 wheels;Bedside commode;Adaptive equipment;Shower seat      Prior Function Level of Independence: Independent               Hand Dominance   Dominant Hand: Right    Extremity/Trunk Assessment   Upper Extremity Assessment Upper Extremity Assessment: Overall [facility]  for tasks assessed    Lower Extremity Assessment Lower Extremity Assessment: Generalized weakness(Consistent with pre-op diagnosis)    Cervical / Trunk Assessment Cervical  / Trunk Assessment: Other exceptions Cervical / Trunk Exceptions: s/p surgery  Communication   Communication: No difficulties  Cognition Arousal/Alertness: Awake/alert Behavior During Therapy: Flat affect Overall Cognitive Status: Within Functional Limits for tasks assessed Area of Impairment: Problem solving                             Problem Solving: Slow processing;Requires verbal cues        General Comments      Exercises     Assessment/Plan    PT Assessment Patient needs continued PT services  PT Problem List Decreased strength;Decreased range of motion;Decreased activity tolerance;Decreased balance;Decreased mobility;Decreased knowledge of use of DME;Decreased safety awareness;Decreased knowledge of precautions;Pain       PT Treatment Interventions DME instruction;Gait training;Stair training;Functional mobility training;Therapeutic activities;Therapeutic exercise;Neuromuscular re-education;Patient/family education    PT Goals (Current goals can be found in the Care Plan section)  Acute Rehab PT Goals Patient Stated Goal: Home tomorrow, not today.  PT Goal Formulation: With patient Time For Goal Achievement: 10/15/18 Potential to Achieve Goals: Good    Frequency Min 5X/week   Barriers to discharge        Co-evaluation               AM-PAC PT "6 Clicks" Daily Activity  Outcome Measure Difficulty turning over in bed (including adjusting bedclothes, sheets and blankets)?: A Little Difficulty moving from lying on back to sitting on the side of the bed? : A Little Difficulty sitting down on and standing up from a chair with arms (e.g., wheelchair, bedside commode, etc,.)?: A Little Help needed moving to and from a bed to chair (including a wheelchair)?: A Little Help needed walking in hospital room?: A Little Help needed climbing 3-5 steps with a railing? : A Little 6 Click Score: 18    End of Session Equipment Utilized During Treatment:  Gait belt;Back brace Activity Tolerance: Patient limited by fatigue Patient left: with call Barret/phone within reach(Sitting EOB with OT present) Nurse Communication: Mobility status PT Visit Diagnosis: Unsteadiness on feet (R26.81);Pain;Other symptoms and signs involving the nervous system (R29.898) Pain - part of body: (back)    Time: 7096-2836 PT Time Calculation (min) (ACUTE ONLY): 25 min   Charges:   PT Evaluation $PT Eval Moderate Complexity: 1 Mod PT Treatments $Gait Training: 8-22 mins        Rolinda Roan, PT, DPT Acute Rehabilitation Services Pager: [phone] Office: [phone]   Thelma Comp 10/08/2018, 9:29 AM

## 2018-10-08 NOTE — Progress Notes (Signed)
Orthopedic Tech Progress Note Patient Details:  Deborah Velazquez May 23, 1946 [id number]  Patient ID: Deborah Velazquez, female   DOB: Apr 13, 1946, 72 y.o.   MRN: [id number] Called rn. Rn stated pt already has their brace.  Karolee Stamps 10/08/2018, 9:08 AM

## 2018-10-08 NOTE — Discharge Summary (Signed)
Physician Discharge Summary  Patient ID: Deborah Velazquez MRN: [id number] DOB/AGE: 1946-04-19 72 y.o.  Admit date: 10/07/2018 Discharge date: 10/08/2018  Admission Diagnoses: Lumbosacral spinal listhesis, spinal stenosis, lumbago, lumbar radiculopathy, neurogenic claudication  Discharge Diagnoses: Lumbosacral spondylolisthesis, spinal stenosis, lumbago, lumbar radiculopathy, neurogenic claudication Active Problems:   Spondylolisthesis of lumbosacral region   Discharged Condition: good  Hospital Course: I performed a L5-S1 decompression, instrumentation and fusion on the patient on 10/07/2018.  The patient's postoperative course was unremarkable.  On postoperative day #1 she requested discharge to home.  She was given written and oral discharge instructions.  All her questions were answered.  Consults: Physical therapy Significant Diagnostic Studies: None Treatments: L5-S1 decompression, instrumentation and fusion. Discharge Exam: Blood pressure 124/68, pulse 76, temperature 98.3 F (36.8 C), temperature source Oral, resp. rate 18, SpO2 96 %. The patient is alert and pleasant.  She looks well.  Her dressing is clean and dry.  Her lower extremity strength is normal.  Disposition: Home  Discharge Instructions    Call MD for:  difficulty breathing, headache or visual disturbances   Complete by:  As directed    Call MD for:  extreme fatigue   Complete by:  As directed    Call MD for:  hives   Complete by:  As directed    Call MD for:  persistant dizziness or light-headedness   Complete by:  As directed    Call MD for:  persistant nausea and vomiting   Complete by:  As directed    Call MD for:  redness, tenderness, or signs of infection (pain, swelling, redness, odor or green/yellow discharge around incision site)   Complete by:  As directed    Call MD for:  severe uncontrolled pain   Complete by:  As directed    Call MD for:  temperature >100.4   Complete by:  As directed     Diet - low sodium heart healthy   Complete by:  As directed    Discharge instructions   Complete by:  As directed    Call [phone] for a followup appointment. Take a stool softener while you are using pain medications.   Driving Restrictions   Complete by:  As directed    Do not drive for 2 weeks.   Increase activity slowly   Complete by:  As directed    Lifting restrictions   Complete by:  As directed    Do not lift more than 5 pounds. No excessive bending or twisting.   May shower / Bathe   Complete by:  As directed    Remove the dressing for 3 days after surgery.  You may shower, but leave the incision alone.   Remove dressing in 48 hours   Complete by:  As directed    Your stitches are under the scan and will dissolve by themselves. The Steri-Strips will fall off after you take a few showers. Do not rub back or pick at the wound, Leave the wound alone.     Allergies as of 10/08/2018      Reactions   Pseudoephedrine Hcl Anxiety, Other (See Comments)   Nervous   Sudafed [pseudoephedrine] Anxiety, Other (See Comments)   Nervous      Medication List    STOP taking these medications   acetaminophen 500 MG tablet Commonly known as:  TYLENOL   HYDROcodone-acetaminophen 10-325 MG tablet Commonly known as:  NORCO   meloxicam 15 MG tablet Commonly known as:  MOBIC  TAKE these medications   atorvastatin 10 MG tablet Commonly known as:  LIPITOR TAKE 1 TABLET(10 MG) BY MOUTH AT BEDTIME   carbidopa-levodopa 50-200 MG tablet Commonly known as:  SINEMET CR Take 1 tablet by mouth at bedtime. What changed:  Another medication with the same name was changed. Make sure you understand how and when to take each.   carbidopa-levodopa 25-100 MG tablet Commonly known as:  SINEMET IR 2 in the morning, 1 in the afternoon, 1 in the evening What changed:    how much to take  how to take this  when to take this   Coenzyme Q10 100 MG capsule Take 100 mg by mouth daily.    cyclobenzaprine 10 MG tablet Commonly known as:  FLEXERIL Take 1 tablet (10 mg total) by mouth 3 (three) times daily as needed for muscle spasms. What changed:    medication strength  See the new instructions.   docusate sodium 100 MG capsule Commonly known as:  COLACE Take 1 capsule (100 mg total) by mouth 2 (two) times daily. What changed:    how much to take  when to take this   DULoxetine 60 MG capsule Commonly known as:  CYMBALTA Take 60 mg by mouth daily.   Fish Oil 1000 MG Caps Take 1,000 mg by mouth daily.   LYRICA 75 MG capsule Generic drug:  pregabalin TAKE 1 CAPSULE(75 MG) BY MOUTH TWICE DAILY   oxyCODONE 5 MG immediate release tablet Commonly known as:  Oxy IR/ROXICODONE Take 1 tablet (5 mg total) by mouth every 4 (four) hours as needed for moderate pain ((score 4 to 6)).   sertraline 100 MG tablet Commonly known as:  ZOLOFT Take 100 mg by mouth daily.   vitamin B-12 250 MCG tablet Commonly known as:  CYANOCOBALAMIN Take 250 mcg by mouth daily.        Signed: Ophelia Charter 10/08/2018, 11:05 AM

## 2018-10-08 NOTE — Evaluation (Signed)
Occupational Therapy Evaluation and Discharge Patient Details Name: Deborah Velazquez MRN: [id number] DOB: Mar 16, 1946 Today's Date: 10/08/2018    History of Present Illness Bilateral L5-S1 laminotomy/foraminotomies/medial facetectomy to decompress the bilateral L5 and S1 nerve roots and fusion   Clinical Impression   This 72 yo female admitted and underwent above presents to acute OT with all education completed as well as handout given, we will D/C from acute OT.    Follow Up Recommendations  No OT follow up;Supervision - Intermittent    Equipment Recommendations  None recommended by OT       Precautions / Restrictions Precautions Precautions: Back Precaution Booklet Issued: Yes (comment) Precaution Comments: Reviewed handout with pt and she was cued for precautions during functional mobility.  Required Braces or Orthoses: Spinal Brace Spinal Brace: Lumbar corset;Applied in sitting position Restrictions Weight Bearing Restrictions: No      Mobility Bed Mobility Overal bed mobility: Needs Assistance Bed Mobility: Rolling;Sidelying to Sit;Sit to Sidelying Rolling: Supervision Sidelying to sit: Supervision     Sit to sidelying: Supervision General bed mobility comments: HOB Flat and no rail, VCs for technique  Transfers Overall transfer level: Needs assistance Equipment used: Rolling walker (2 wheeled) Transfers: Sit to/from Stand Sit to Stand: Supervision         General transfer comment: No assist required to power-up to full stand however increased time required    Balance Overall balance assessment: Mild deficits observed, not formally tested                                         ADL either performed or assessed with clinical judgement   ADL Overall ADL's : Needs assistance/impaired Eating/Feeding: Independent;Sitting     Grooming Details (indicate cue type and reason): Educated on using 2 cups for brushing teeth (one to spit and one  rinse) Upper Body Bathing: Set up;Supervision/ safety;Sitting   Lower Body Bathing: Set up;Supervison/ safety;Sit to/from stand Lower Body Bathing Details (indicate cue type and reason): Pt can cross legs to get to feet Upper Body Dressing : Supervision/safety;Set up;Sitting   Lower Body Dressing: Supervision/safety;Set up;Sit to/from stand Lower Body Dressing Details (indicate cue type and reason): Pt can cross legs to get to feet, educated on donning underwear and pants then socks  Toilet Transfer: Supervision/safety;Ambulation;RW     Toileting - Clothing Manipulation Details (indicate cue type and reason): Educated on using wet wipes for back peri care to help with avoiding as much twisting as possible   Tub/Shower Transfer Details (indicate cue type and reason): Educated on side stepping into tub   General ADL Comments: Educated on not sitting more than 20-30 minutes intially ,then get up and walk around so can sit another 20-30 minutes     Vision Patient Visual Report: No change from baseline              Pertinent Vitals/Pain Pain Assessment: 0-10 Pain Score: 3  Pain Location: incisional Pain Descriptors / Indicators: Sore Pain Intervention(s): Limited activity within patient's tolerance;Monitored during session;Repositioned     Hand Dominance Right   Extremity/Trunk Assessment Upper Extremity Assessment Upper Extremity Assessment: Overall WFL for tasks assessed     Communication Communication Communication: No difficulties   Cognition Arousal/Alertness: Awake/alert Behavior During Therapy: Flat affect Overall Cognitive Status: Impaired Area of Impairment: Problem solving  Problem Solving: Slow processing;Requires verbal cues                Home Living Family/patient expects to be discharged to:: Private residence Living Arrangements: Spouse/significant other Available Help at Discharge: Family Type of Home:  House Home Access: Stairs to enter Technical brewer of Steps: 2 Entrance Stairs-Rails: Right Home Layout: One level     Bathroom Shower/Tub: Tub/shower unit;Curtain   Biochemist, clinical: Standard     Home Equipment: Environmental consultant - 2 wheels;Bedside commode;Adaptive equipment;Shower seat Adaptive Equipment: Reacher;Sock aid;Long-handled shoe horn;Long-handled sponge        Prior Functioning/Environment Level of Independence: Independent                 OT Problem List: Pain;Decreased strength         OT Goals(Current goals can be found in the care plan section) Acute Rehab OT Goals Patient Stated Goal: home soon  OT Frequency:                AM-PAC PT "6 Clicks" Daily Activity     Outcome Measure Help from another person eating meals?: None Help from another person taking care of personal grooming?: None Help from another person toileting, which includes using toliet, bedpan, or urinal?: None Help from another person bathing (including washing, rinsing, drying)?: None Help from another person to put on and taking off regular upper body clothing?: None Help from another person to put on and taking off regular lower body clothing?: None 6 Click Score: 24   End of Session Equipment Utilized During Treatment: Rolling walker;Back brace Nurse Communication: (NT: pt may call to go walking in 20-30 minutes if she feels she needs someone to walk with her; no further OT needs)  Activity Tolerance: Patient tolerated treatment well Patient left: in chair;with call [place]/phone within reach;with family/visitor present  OT Visit Diagnosis: Unsteadiness on feet (R26.81);Pain Pain - part of body: (incisional)                Time: 5621-3086 OT Time Calculation (min): 35 min Charges:  OT General Charges $OT Visit: 1 Visit OT Evaluation $OT Eval Moderate Complexity: 1 Mod OT Treatments $Self Care/Home Management : 8-22 mins  Deborah Velazquez, OTR/L Acute [employer] Pager  [phone] Office [phone]     Deborah Velazquez 10/08/2018, 9:31 AM

## 2018-10-09 ENCOUNTER — Other Ambulatory Visit: Payer: Self-pay

## 2018-10-09 NOTE — Consult Note (Signed)
Patient active with [place] Coordinator with [employer]   Patient's primary care provider is Dr. Deborra Medina.  This office provides the transition of care follow up.    Per surgical MD note:  Brief history: The patient is a 72 year old white female who has complained of back and leg pain, numbness and weakness.  She has failed medical management and was worked up with a lumbar MRI and lumbar x-rays.  She has a transitional vertebrae which has been called S1.  At L5-S1 the patient has a grade 1 spinal listhesis with severe stenosis.  I discussed the various treatment options with the patient treating surgery.  She has weighed the risks, benefits and alternatives of surgery and decided proceed with an L5-S1 decompression, nstrumentation and fusion.  Patient will be assigned post hospital follow up with [place] in the Diabetes Program and post surgical follow up.  Natividad Brood, RN BSN [facility] Liaison  [phone] business mobile phone Toll free office [phone]

## 2018-10-12 ENCOUNTER — Other Ambulatory Visit: Payer: Self-pay | Admitting: *Deleted

## 2018-10-12 ENCOUNTER — Encounter: Payer: Self-pay | Admitting: *Deleted

## 2018-10-12 ENCOUNTER — Other Ambulatory Visit: Payer: Self-pay

## 2018-10-12 NOTE — Patient Outreach (Signed)
[facility]) Care Management  10/12/2018  Deborah Velazquez July 11, 1946 [id number]  Care coordination:  Received notification from [place] that she will follow up with patient post hospital discharge.   ASSESSMENT:   Admit date: 10/07/2018 Discharge date: 10/08/2018  Admission Diagnoses: Lumbosacral spinal listhesis, spinal stenosis, lumbago, lumbar radiculopathy, neurogenic claudication  Discharge Diagnoses: Lumbosacral spondylolisthesis, spinal stenosis, lumbago, lumbar radiculopathy, neurogenic claudication Active Problems:   Spondylolisthesis of lumbosacral region   PLAN:  No additional follow up needed by this RNCM at this time.    Quinn Plowman RN,BSN,CCM [facility] Telephonic  [phone]

## 2018-10-12 NOTE — Patient Outreach (Signed)
[facility]) [name] Telephone Outreach PCP office completes Transition of Care follow up post-hospital discharge Post-hospital discharge day # 4-- elective surgery Unsuccessful telephone outreach attempt # 1  10/12/2018  [name] 1946-02-26 [id number]  Unsuccessful telephone outreach attempt to Deborah Velazquez, 72 y/o female referred to [place] after recent elective surgery October 07, 2018 for lumbar fusion/ decompression; patient was previously active with [facility] telephonic RN CM.  Patient has had no recent unplanned hospitalizations or ED visits.  Patient has history including, but not limited to, Parkinson's Disease; HLD; and pre-diabetes.  HIPAA compliant voice mail message left for patient, requesting return call back.  Plan:  Will place [facility] CM unsuccessful patient outreach letter in mail requesting call back in writing  Will re-attempt [place] telephone outreach later this week if I do not hear back from patient first.  [name], RN, BSN, [place] Coordinator [facility] Care Management  [phone]

## 2018-10-13 ENCOUNTER — Other Ambulatory Visit: Payer: Self-pay

## 2018-10-13 ENCOUNTER — Encounter: Payer: Self-pay | Admitting: Emergency Medicine

## 2018-10-13 ENCOUNTER — Emergency Department
Admission: EM | Admit: 2018-10-13 | Discharge: 2018-10-14 | Disposition: A | Discharge status: Home / Self Care | Payer: PPO | Attending: Emergency Medicine | Admitting: Emergency Medicine

## 2018-10-13 ENCOUNTER — Telehealth: Payer: Self-pay | Admitting: Internal Medicine

## 2018-10-13 ENCOUNTER — Ambulatory Visit: Payer: Self-pay

## 2018-10-13 DIAGNOSIS — G20 Parkinson's disease: Secondary | ICD-10-CM | POA: Insufficient documentation

## 2018-10-13 DIAGNOSIS — M545 Low back pain, unspecified: Secondary | ICD-10-CM

## 2018-10-13 DIAGNOSIS — T481X5A Adverse effect of skeletal muscle relaxants [neuromuscular blocking agents], initial encounter: Secondary | ICD-10-CM | POA: Diagnosis not present

## 2018-10-13 DIAGNOSIS — R410 Disorientation, unspecified: Secondary | ICD-10-CM | POA: Diagnosis not present

## 2018-10-13 DIAGNOSIS — R269 Unspecified abnormalities of gait and mobility: Secondary | ICD-10-CM | POA: Diagnosis not present

## 2018-10-13 DIAGNOSIS — Z79899 Other long term (current) drug therapy: Secondary | ICD-10-CM | POA: Diagnosis not present

## 2018-10-13 DIAGNOSIS — R2689 Other abnormalities of gait and mobility: Secondary | ICD-10-CM | POA: Diagnosis not present

## 2018-10-13 DIAGNOSIS — R300 Dysuria: Secondary | ICD-10-CM | POA: Diagnosis present

## 2018-10-13 DIAGNOSIS — R262 Difficulty in walking, not elsewhere classified: Secondary | ICD-10-CM

## 2018-10-13 LAB — COMPREHENSIVE METABOLIC PANEL
ALK PHOS: 56 U/L (ref 38–126)
ALT: 11 U/L (ref 0–44)
AST: 20 U/L (ref 15–41)
Albumin: 3.5 g/dL (ref 3.5–5.0)
Anion gap: 12 (ref 5–15)
BUN: 12 mg/dL (ref 8–23)
CALCIUM: 9.1 mg/dL (ref 8.9–10.3)
CO2: 25 mmol/L (ref 22–32)
CREATININE: 0.55 mg/dL (ref 0.44–1.00)
Chloride: 98 mmol/L (ref 98–111)
GFR calc Af Amer: >60 mL/min (ref >60)
GFR calc non Af Amer: >60 mL/min (ref >60)
Glucose, Bld: 121 mg/dL — ABNORMAL HIGH (ref 70–99)
Potassium: 4.3 mmol/L (ref 3.5–5.1)
SODIUM: 135 mmol/L (ref 135–145)
Total Bilirubin: 0.7 mg/dL (ref 0.3–1.2)
Total Protein: 7.2 g/dL (ref 6.5–8.1)

## 2018-10-13 LAB — URINALYSIS, COMPLETE (UACMP) WITH MICROSCOPIC
BACTERIA UA: NONE SEEN
Bilirubin Urine: NEGATIVE
GLUCOSE, UA: NEGATIVE mg/dL
Ketones, ur: NEGATIVE mg/dL
Leukocytes, UA: NEGATIVE
Nitrite: NEGATIVE
PH: 6 (ref 5.0–8.0)
Protein, ur: NEGATIVE mg/dL
Specific Gravity, Urine: 1.011 (ref 1.005–1.030)

## 2018-10-13 LAB — TROPONIN I: Troponin I: <0.03 ng/mL (ref <0.03)

## 2018-10-13 LAB — CBC
HCT: 29.5 % — ABNORMAL LOW (ref 36.0–46.0)
Hemoglobin: 9.6 g/dL — ABNORMAL LOW (ref 12.0–15.0)
MCH: 29.4 pg (ref 26.0–34.0)
MCHC: 32.5 g/dL (ref 30.0–36.0)
MCV: 90.5 fL (ref 80.0–100.0)
PLATELETS: 299 10*3/uL (ref 150–400)
RBC: 3.26 MIL/uL — ABNORMAL LOW (ref 3.87–5.11)
RDW: 13.6 % (ref 11.5–15.5)
WBC: 6.8 10*3/uL (ref 4.0–10.5)
nRBC: 0 % (ref 0.0–0.2)

## 2018-10-13 LAB — LIPASE, BLOOD: Lipase: 28 U/L (ref 11–51)

## 2018-10-13 MED ORDER — COENZYME Q10 100 MG PO CAPS: 100.0000 mg | ORAL_CAPSULE | Freq: Every day | ORAL | Status: DC

## 2018-10-13 MED ORDER — SODIUM CHLORIDE 0.9 % IV BOLUS
1000.0000 mL | Freq: Once | INTRAVENOUS | Status: AC
  Administered 2018-10-13: 1000 mL via INTRAVENOUS

## 2018-10-13 MED ORDER — PREGABALIN 75 MG PO CAPS
75.0000 mg | ORAL_CAPSULE | Freq: Every day | ORAL | Status: DC
  Administered 2018-10-13 – 2018-10-14 (×2): 75 mg via ORAL
  Filled 2018-10-13 (×2): qty 1

## 2018-10-13 MED ORDER — DOCUSATE SODIUM 100 MG PO CAPS
100.0000 mg | ORAL_CAPSULE | Freq: Two times a day (BID) | ORAL | Status: DC
  Administered 2018-10-13 – 2018-10-14 (×2): 100 mg via ORAL
  Filled 2018-10-13 (×2): qty 1

## 2018-10-13 MED ORDER — SODIUM CHLORIDE 0.9 % IV BOLUS: 1000.0000 mL | Freq: Once | INTRAVENOUS | Status: DC

## 2018-10-13 MED ORDER — CARBIDOPA-LEVODOPA 25-100 MG PO TABS
1.0000 | ORAL_TABLET | Freq: Three times a day (TID) | ORAL | Status: DC
  Administered 2018-10-13 – 2018-10-14 (×2): 1 via ORAL
  Filled 2018-10-13 (×6): qty 1

## 2018-10-13 MED ORDER — OXYCODONE HCL 5 MG PO TABS: 5.0000 mg | ORAL_TABLET | ORAL | Status: DC | PRN

## 2018-10-13 MED ORDER — SERTRALINE HCL 50 MG PO TABS
100.0000 mg | ORAL_TABLET | Freq: Every day | ORAL | Status: DC
  Administered 2018-10-13 – 2018-10-14 (×2): 100 mg via ORAL
  Filled 2018-10-13 (×2): qty 2

## 2018-10-13 MED ORDER — CARBIDOPA-LEVODOPA 25-100 MG PO TABS: 1.0000 | ORAL_TABLET | ORAL | Status: DC

## 2018-10-13 MED ORDER — ATORVASTATIN CALCIUM 20 MG PO TABS: 10.0000 mg | ORAL_TABLET | Freq: Every day | ORAL | Status: DC

## 2018-10-13 MED ORDER — CARBIDOPA-LEVODOPA ER 50-200 MG PO TBCR
1.0000 | EXTENDED_RELEASE_TABLET | Freq: Every day | ORAL | Status: DC
  Administered 2018-10-13: 1 via ORAL
  Filled 2018-10-13 (×2): qty 1

## 2018-10-13 MED FILL — Sodium Chloride IV Soln 0.9%: INTRAVENOUS | Qty: 1000 | Status: AC

## 2018-10-13 MED FILL — Heparin Sodium (Porcine) Inj 1000 Unit/ML: INTRAMUSCULAR | Qty: 30 | Status: AC

## 2018-10-13 NOTE — ED Provider Notes (Addendum)
[facility] Emergency Department Provider Note  Time seen: 4:12 PM  I have reviewed the triage vital signs and the nursing notes.   HISTORY  Chief Complaint Dehydration    HPI Deborah Velazquez is a 72 y.o. female with a past medical history of anxiety, arthritis, hyperlipidemia, Parkinson's disease, presents to the emergency department for possible dehydration and confusion.  According to the patient and family patient had a back surgery at [facility] 10/07/2018, ultimately was discharged home the following day.  Since going home patient has continued to have significant pain, was taking hydrocodone, was having leg cramps back cramps and continue to have back pain/spasms.  Her doctor switched her to cyclobenzaprine and oxycodone yesterday.  Family noted that this morning patient appeared to be confused early this morning shortly after taking these medications.  Since the patient has become more coherent and is acting fairly normal currently per family.  Patient called her primary care doctor who is concerned about dehydration, the family also voiced concerns that they do not believe they can adequately care for the patient at home without help, so they recommended she come to the emergency department for evaluation.  She denies any known fever.  They do state the patient urinates very frequently especially at night which is abnormal for her.  Patient states a burning sensation when she urinates.  No vomiting no diarrhea, constipated x1 week but the patient is currently taking pain medication as well as Colace.   Past Medical History:  Diagnosis Date  . Anxiety   . Arthritis   . Hyperlipemia   . Irritable bowel syndrome   . Parkinson disease ([place])   . Restless leg syndrome   . RSD (reflex sympathetic dystrophy)    pt denies    Patient Active Problem List   Diagnosis Date Noted  . Spondylolisthesis of lumbosacral region 10/07/2018  . Preoperative  evaluation to rule out surgical contraindication 09/13/2018  . Neurogenic claudication due to lumbar spinal stenosis 08/04/2018  . Neuropathy 07/13/2018  . Leg cramps 05/13/2018  . Loss of memory 09/22/2017  . Parkinson's disease ([place]) 08/19/2017  . Prediabetes 07/26/2016  . Pure hypercholesterolemia 12/23/2014  . Anxiety 06/24/2014    Past Surgical History:  Procedure Laterality Date  . APPENDECTOMY    . BREAST BIOPSY Left 08/16/2013   neg  . CATARACT EXTRACTION Left   . COLONOSCOPY    . COLONOSCOPY WITH PROPOFOL N/A 06/20/2015   Procedure: COLONOSCOPY WITH PROPOFOL;  Surgeon: Lollie Sails, MD;  Location: [facility] ENDOSCOPY;  Service: Endoscopy;  Laterality: N/A;  . EYE SURGERY     Cataract in one eye  . FRACTURE SURGERY     wrist - right  . PARS PLANA VITRECTOMY Left 2010    Prior to Admission medications   Medication Sig Start Date End Date Taking? Authorizing Provider  atorvastatin (LIPITOR) 10 MG tablet TAKE 1 TABLET(10 MG) BY MOUTH AT BEDTIME 09/30/18   [name], MD  carbidopa-levodopa (SINEMET CR) 50-200 MG tablet Take 1 tablet by mouth at bedtime. 08/24/18   Tat, Eustace Quail, DO  carbidopa-levodopa (SINEMET IR) 25-100 MG tablet 2 in the morning, 1 in the afternoon, 1 in the evening Patient taking differently: Take 1-2 tablets by mouth See admin instructions. 2 in the morning, 1 in the afternoon, 1 in the evening 08/24/18   Tat, Rebecca S, DO  Coenzyme Q10 100 MG capsule Take 100 mg by mouth daily.     [provider]  cyclobenzaprine (FLEXERIL) 10 MG tablet Take 1 tablet (10 mg total) by mouth 3 (three) times daily as needed for muscle spasms. 10/08/18   Newman Pies, MD  docusate sodium (COLACE) 100 MG capsule Take 1 capsule (100 mg total) by mouth 2 (two) times daily. 10/08/18   Newman Pies, MD  DULoxetine (CYMBALTA) 60 MG capsule Take 60 mg by mouth daily.     [provider]  LYRICA 75 MG capsule TAKE 1 CAPSULE(75 MG) BY MOUTH TWICE  DAILY Patient not taking: No sig reported 09/03/18   [name], MD  Omega-3 Fatty Acids (FISH OIL) 1000 MG CAPS Take 1,000 mg by mouth daily.     [provider]  oxyCODONE (OXY IR/ROXICODONE) 5 MG immediate release tablet Take 1 tablet (5 mg total) by mouth every 4 (four) hours as needed for moderate pain ((score 4 to 6)). 10/08/18   Newman Pies, MD  sertraline (ZOLOFT) 100 MG tablet Take 100 mg by mouth daily.    [provider]  vitamin B-12 (CYANOCOBALAMIN) 250 MCG tablet Take 250 mcg by mouth daily.    [provider]    Allergies  Allergen Reactions  . Pseudoephedrine Hcl Anxiety and Other (See Comments)    Nervous  . Sudafed [Pseudoephedrine] Anxiety and Other (See Comments)    Nervous    Family History  Problem Relation Age of Onset  . Breast cancer Mother 33  . Diabetes Mother   . Cancer Mother   . Heart disease Mother   . Alzheimer's disease Father   . Heart disease Maternal Aunt     Social History Social History   Tobacco Use  . Smoking status: Never Smoker  . Smokeless tobacco: Never Used  Substance Use Topics  . Alcohol use: Yes    Comment: 1-2 x a week  . Drug use: No    Review of Systems Constitutional: Negative for fever. Cardiovascular: Negative for chest pain. Respiratory: Negative for shortness of breath. Gastrointestinal: Negative for abdominal pain, vomiting.  Positive for constipation. Genitourinary: Negative for urinary compaints Musculoskeletal: Negative for musculoskeletal complaints Skin: Negative for skin complaints  Neurological: Negative for headache All other ROS negative  ____________________________________________   PHYSICAL EXAM:  VITAL SIGNS: ED Triage Vitals  Enc Vitals Group     BP 10/13/18 1324 124/82     Pulse Rate 10/13/18 1324 92     Resp 10/13/18 1324 16     Temp 10/13/18 1324 98.7 F (37.1 C)     Temp Source 10/13/18 1324 Oral     SpO2 10/13/18 1324 96 %     Weight 10/13/18  1322 138 lb 4.8 oz (62.7 kg)     Height 10/13/18 1322 5\' 6"  (1.676 m)     Head Circumference --      Peak Flow --      Pain Score 10/13/18 1322 0     Pain Loc --      Pain Edu? --      Excl. in [place]? --    Constitutional: Alert and oriented. Well appearing and in no distress. Eyes: Normal exam ENT   Head: Normocephalic and atraumatic.   Mouth/Throat: Mucous membranes are moist. Cardiovascular: Normal rate, regular rhythm. No murmur Respiratory: Normal respiratory effort without tachypnea nor retractions. Breath sounds are clear  Gastrointestinal: Soft and nontender. No distention.  Musculoskeletal: Nontender with normal range of motion in all extremities.  Well-appearing lumbar incision with honeycomb dressing.  Nontender to palpation.  No signs  of discharge. Neurologic:  Normal speech and language. No gross focal neurologic deficits  Skin:  Skin is warm, dry and intact.  Psychiatric: Mood and affect are normal.   ____________________________________________   INITIAL IMPRESSION / ASSESSMENT AND PLAN / ED COURSE  Pertinent labs & imaging results that were available during my care of the patient were reviewed by me and considered in my medical decision making (see chart for details).  Patient presents the emergency department for confusion, continued back pain and spasm, family is having significant difficulties at home trying to care for the patient.  Overall the patient appears very well currently, highly suspect the patient's confusion this morning was likely due to her new medications cyclobenzaprine as well as oxycodone.  Patient's labs are largely at baseline for the patient, reassuringly kidney function is normal.  Troponin is negative.  We will IV hydrate.  We will check a urinalysis.  We will also obtain PT/OT/social work consultation as the family does not believe they can adequately care for the patient at home.  Patient's labs are essentially normal.  Patient receiving  IV fluids but is medically cleared at this time.  I discussed with social work they would like PT and OT to evaluate the patient and I will attempt to place.  This likely will not happen today, I discussed this with the family they are agreeable to spend the night in the emergency department for PT/OT/social work consultation tomorrow.  EKG reviewed and interpreted by myself shows a normal sinus rhythm at 92 bpm with a narrow QRS, normal axis, normal intervals, no concerning ST changes.  Normal EKG. ____________________________________________   FINAL CLINICAL IMPRESSION(S) / ED DIAGNOSES  Back pain Confusion/medication reaction    Harvest Dark, MD 10/14/18 1510    Harvest Dark, MD 10/24/18 [phone]

## 2018-10-13 NOTE — ED Notes (Signed)
Di Jasmer RN helped the Pt to the bedside commode to have a BM. Pt had a BM  And returned to her bed without difficulty.

## 2018-10-13 NOTE — ED Notes (Signed)
In and out cath for cleare yelow urine.

## 2018-10-13 NOTE — ED Triage Notes (Addendum)
Had a back surgery last week to L5.  Patient has been taking percocet for pain.  No BM x 1 week.  Patient's physician is concerned that patient may be dehydrated / constipated.  Also c/o intermittent leg pain and skin dryness on legs.  Patient also started on flexeril over the week end for c/o leg cramping.  Patients daughter also reports that started last night patient seem slightly confused at times and balance seems "off".  Patient c/o intermittent dizziness.

## 2018-10-13 NOTE — ED Notes (Signed)
Meal tray given to patient.

## 2018-10-13 NOTE — Clinical Social Work Note (Signed)
CSW received consult that patient may need SNF placement.  CSW requested that physician order PT and OT, since patient will need insurance authorization for SNF.  CSW awaiting PT and OT recommendations.  Evette Cristal, MSW, [facility] ED Covering CSW [phone] 10/13/2018 4:12 PM

## 2018-10-13 NOTE — Progress Notes (Signed)
PHARMACIST - PHYSICIAN ORDER COMMUNICATION  CONCERNING: P&T Medication Policy on Herbal Medications  DESCRIPTION:  This patient's order for:  Coenzyme Q10 100 mg  has been noted.  This product(s) is classified as an "herbal" or natural product. Due to a lack of definitive safety studies or FDA approval, nonstandard manufacturing practices, plus the potential risk of unknown drug-drug interactions while on inpatient medications, the Pharmacy and Therapeutics Committee does not permit the use of "herbal" or natural products of this type within [facility].   ACTION TAKEN: The pharmacy department is unable to verify this order at this time. Please reevaluate patient's clinical condition at discharge and address if the herbal or natural product(s) should be resumed at that time.

## 2018-10-14 ENCOUNTER — Other Ambulatory Visit: Payer: Self-pay | Admitting: Internal Medicine

## 2018-10-14 NOTE — ED Notes (Signed)
Pt brief changed, assisted pt to the toilet and pt was repositioned in recliner by this tech and [name],RN. Call light at side of pt. Pt urged to use call light if she needs anything. Pt agrees to stay sitting up in recliner until after she eats breakfast at least until 9am.

## 2018-10-14 NOTE — ED Provider Notes (Signed)
 -----------------------------------------   11:54 AM on 10/14/2018 -----------------------------------------  Physical therapy finds the patient to be safe at home with home PT OT home health and social work services.  Home health agency has come to evaluate the patient and set up all of her needs.  Remains medically stable, suitable for discharge home.  Follow-up with primary care.  Has family at bedside.   Carrie Mew, MD 10/14/18 1154

## 2018-10-14 NOTE — ED Notes (Signed)
Pharmacy called.  Sinemet requested.  Pharmacy said they would sent it up.

## 2018-10-14 NOTE — ED Notes (Signed)
Report received from [facility]. Patient care assumed. Patient/RN introduction complete. Will continue to monitor.

## 2018-10-14 NOTE — ED Notes (Signed)
Pt sitting up in recliner 

## 2018-10-14 NOTE — ED Notes (Signed)
Pt is constantly up to the bathroom.  At times, she does not urinate or have a bowel movement.  Attempted to get pt from toilet to chair and as soon as pt sat down, she said she needed to go to the bathroom.  Pt left on toilet with call Yamamoto within reach.

## 2018-10-14 NOTE — Progress Notes (Signed)
CSW aware of patient in ED- awaiting PT eval to determine if SNF is appropriate. Will continue to follow.   Kingsley Spittle, [place]  [phone]

## 2018-10-14 NOTE — ED Notes (Addendum)
Pt up to bathroom, mod sized formed stool noted.

## 2018-10-14 NOTE — Telephone Encounter (Signed)
Spoke with patient 's family after dc from [facility] ER for  Dehydration post operatively from back surgery,  Flexeril l stopped due to MS changes,  Continue Lyrica,  Which she is tolerating  For restless lelgs,  And prn oxycodone .

## 2018-10-14 NOTE — ED Notes (Signed)
Assisted pt from bed to toilet, pt with urge to urinate. Once pt on toilet and sat there for approximally ten minutes she was unable to use toilet. Placed pt in recliner. Pt c/o brief "has a knot in it." This tech repositioned pt brief and placed her back in recliner. Pt then stated "I have to go to the toilet again." Pt placed back on toilet and [name],RN is with pt now.

## 2018-10-14 NOTE — ED Notes (Addendum)
Pt given wet washcloth to clean face. Pt with urge to urinate, assisted to toilet to do so but was unable to do so. Pt ambulated back to recliner and given call light to call for needs. Back brace readjusted. Pt asking for "one of those things that you blow in because I feel very congested." In formed pt that I will ask MD if it is ok for pt to have incentive spirometer.

## 2018-10-14 NOTE — Care Management Note (Addendum)
Case Management Note  Patient Details  Name: Deborah Velazquez MRN: [id number] Date of Birth: 04-05-1946  Subjective/Objective:                 Patient has been evaluated by physical therapy and recommendation to  meet functional needs is home health.  Patient did ambulate 150 feet per physical therapy.  She has access to walker and bedside commode. Spoke with patient, her husband and close friends.  The couple does not have any siblings or children - no other extended family members.  Patient states in the presence of her husband that she does not want to go to skilled nursing facility.  No agency preference for home health. Informed that CM would contact agency that is in network with insurance and who can see patient either today or 11.21 morning.  Patient has long term care insurance and in need of agency to provided in home continuous care.  Agency preference is Deborah Velazquez, Deborah Velazquez.  Family will transport home and deny issues getting patient into the house from the car   Action/Plan:  Deborah Velazquez can accept referral and if patient can discharge home soon enough may be able to visit today- otherwise will see 11.21 morning.  [facility] representative will come to ED to speak with patient and husband. Updated primary nurse. Requested home health orders from ED physician.  Expected Discharge Date:                  Expected Discharge Plan:     In-House Referral:     Discharge planning Services     Post Acute Care Choice:    Choice offered to:     DME Arranged:    DME Agency:     HH Arranged:    HH Agency:     Status of Service:     If discussed at [employer], dates discussed:    Additional Comments:  Deborah Stack, RN 10/14/2018, 10:19 AM

## 2018-10-14 NOTE — Discharge Instructions (Signed)
Follow-up with home health and physical therapy and your primary care doctor for continued management of your pain and difficulty walking.

## 2018-10-14 NOTE — ED Notes (Signed)
Pt given breakfast tray. No other needs at this time.

## 2018-10-14 NOTE — Evaluation (Signed)
Physical Therapy Evaluation Patient Details Name: Deborah Velazquez MRN: [id number] DOB: 12-22-1945 Today's Date: 10/14/2018   History of Present Illness  presented to ER secondary to progressive weakness, back pain, mild AMS; family concerned about ability to manage in home environment.  Of note, patient with recent bilat L5-S1 laminotomy/foraminotomy/facetectomy and fusion (10/07/18).  Clinical Impression  Upon evaluation, patient alert and oriented; follows commands and demonstrates fair/good insight into deficits and necessary assist.  Rates pain in back at 2-3/10; strength grossly symmetrical and WFL, denies sensory deficit or radicular symptoms.  Demonstrates ability to complete bed mobility with close sup (good integration of log rolling technique); sit/stand, basic transfers and gait (140') with RW, cga/close sup. Mildly antalgic to R LE, but no overt buckling or LOB. Would benefit from skilled PT to address above deficits and promote optimal return to PLOF; Recommend transition to [place] upon discharge from acute hospitalization.     Follow Up Recommendations Home health PT(HHOT, HHRN, HHaide)    Equipment Recommendations  (has RW, BSC)    Recommendations for Other Services       Precautions / Restrictions Precautions Precautions: Fall;Back Required Braces or Orthoses: Spinal Brace Spinal Brace: Lumbar corset;Applied in sitting position Restrictions Weight Bearing Restrictions: No      Mobility  Bed Mobility Overal bed mobility: Needs Assistance Bed Mobility: Sit to Supine         Sit to sidelying: Supervision General bed mobility comments: good integration/demonstration of log rolling technique  Transfers Overall transfer level: Needs assistance Equipment used: Rolling walker (2 wheeled) Transfers: Sit to/from Stand Sit to Stand: Min guard         General transfer comment: slightly impulsive, but fair/good hand placement  Ambulation/Gait Ambulation/Gait  assistance: Min guard Gait Distance (Feet): 140 Feet Assistive device: Rolling walker (2 wheeled)       General Gait Details: reciprocal stepping pattern, but mildly antalgic pattern to R LE (closed chain posterior-lateral weakness).  Reports mild fatigue with gait efforts "because I haven't walked much", but demonstrates no buckling or LOB.  Stairs            Wheelchair Mobility    Modified Rankin (Stroke Patients Only)       Balance Overall balance assessment: Needs assistance Sitting-balance support: No upper extremity supported;Feet supported Sitting balance-Leahy Scale: Good     Standing balance support: Bilateral upper extremity supported Standing balance-Leahy Scale: Fair                               Pertinent Vitals/Pain Pain Assessment: 0-10 Pain Score: 3  Pain Location: incisional Pain Descriptors / Indicators: Sore Pain Intervention(s): Limited activity within patient's tolerance;Monitored during session;Repositioned    Home Living Family/patient expects to be discharged to:: Private residence Living Arrangements: Spouse/significant other Available Help at Discharge: Family Type of Home: House Home Access: Stairs to enter Entrance Stairs-Rails: Right Entrance Stairs-Number of Steps: 2 Home Layout: One level Home Equipment: Environmental consultant - 2 wheels;Bedside commode;Adaptive equipment;Shower seat      Prior Function Level of Independence: Independent         Comments: Indep with ADLs, household and community mobilization; has been utilizing RW since recent surgery     Hand Dominance   Dominant Hand: Right    Extremity/Trunk Assessment   Upper Extremity Assessment Upper Extremity Assessment: Overall WFL for tasks assessed    Lower Extremity Assessment Lower Extremity Assessment: Generalized weakness(grossly 4-/5 throughout, generally symmetrical; denies  numbness, tingling or radicular symptoms.  Mild functional weakness R  posterior/lateral hip musculature (previous injury))       Communication   Communication: No difficulties  Cognition Arousal/Alertness: Awake/alert Behavior During Therapy: WFL for tasks assessed/performed Overall Cognitive Status: Within Functional Limits for tasks assessed                                 General Comments: multiple questions, answered to satisfaction and within scope      General Comments      Exercises Other Exercises Other Exercises: Verbally reviewed back precautions and wearing schedule for corset; patient voiced understanding of all ifnromation. Other Exercises: Toilet transfer, ambulatory with RW, cga/close sup; improved cadence, overall movement fluidity with urgency/purpose to mobility. Sit/stand from standard toilet, cga with grab bars; encouraged frame of BSC over toilet in home environment (aptient reports this is already in place).  Standing balance for clothing management, close sup with RW.   Assessment/Plan    PT Assessment Patient needs continued PT services  PT Problem List Decreased strength;Decreased range of motion;Decreased activity tolerance;Decreased balance;Decreased mobility;Decreased knowledge of use of DME;Decreased safety awareness;Decreased knowledge of precautions;Pain       PT Treatment Interventions DME instruction;Gait training;Stair training;Functional mobility training;Therapeutic activities;Therapeutic exercise;Neuromuscular re-education;Patient/family education    PT Goals (Current goals can be found in the Care Plan section)  Acute Rehab PT Goals Patient Stated Goal: to get back on my feet PT Goal Formulation: With patient Time For Goal Achievement: 10/28/18 Potential to Achieve Goals: Good    Frequency Min 2X/week   Barriers to discharge        Co-evaluation               AM-PAC PT "6 Clicks" Daily Activity  Outcome Measure Difficulty turning over in bed (including adjusting bedclothes, sheets  and blankets)?: A Little Difficulty moving from lying on back to sitting on the side of the bed? : A Little Difficulty sitting down on and standing up from a chair with arms (e.g., wheelchair, bedside commode, etc,.)?: Unable Help needed moving to and from a bed to chair (including a wheelchair)?: A Little Help needed walking in hospital room?: A Little Help needed climbing 3-5 steps with a railing? : A Little 6 Click Score: 16    End of Session Equipment Utilized During Treatment: Gait belt;Back brace Activity Tolerance: Patient tolerated treatment well Patient left: in bed;with call Mccanless/phone within reach Nurse Communication: Mobility status PT Visit Diagnosis: Unsteadiness on feet (R26.81);Pain;Other symptoms and signs involving the nervous system (R29.898) Pain - part of body: (back)    Time: 1194-1740 PT Time Calculation (min) (ACUTE ONLY): 29 min   Charges:   PT Evaluation $PT Eval Moderate Complexity: 1 Mod PT Treatments $Therapeutic Activity: 8-22 mins       Mirna Sutcliffe H. Owens Shark, PT, DPT, NCS 10/14/18, 10:16 AM [phone]

## 2018-10-14 NOTE — ED Notes (Signed)
Pt laying in bed comfortably with family at bedside. Call light in placed for pt to use for needs.

## 2018-10-14 NOTE — Progress Notes (Addendum)
OT Cancellation Note  Patient Details Name: Deborah Velazquez MRN: [id number] DOB: January 28, 1946   Cancelled Treatment:    Reason Eval/Treat Not Completed: Other (comment). Order received, chart reviewed. Per PT and RNCM, plan is to set up [facility] services (OT, PT, RN, Aide) at this time. OT order cancelled per RNCM. Will sign off. Please re-consult if additional needs arise.    Jeni Salles, MPH, MS, OTR/L ascom [phone] 10/14/18, 10:31 AM

## 2018-10-14 NOTE — ED Notes (Signed)
Pt up to bathroom with minimal assistance, no co pain.

## 2018-10-14 NOTE — ED Notes (Signed)
Pt resting comfortably without distress noted, will continue to monitor.

## 2018-10-15 ENCOUNTER — Encounter: Payer: Self-pay | Admitting: *Deleted

## 2018-10-15 ENCOUNTER — Other Ambulatory Visit: Payer: Self-pay | Admitting: *Deleted

## 2018-10-15 DIAGNOSIS — Z4789 Encounter for other orthopedic aftercare: Secondary | ICD-10-CM | POA: Diagnosis not present

## 2018-10-15 DIAGNOSIS — M4316 Spondylolisthesis, lumbar region: Secondary | ICD-10-CM | POA: Diagnosis not present

## 2018-10-15 DIAGNOSIS — F419 Anxiety disorder, unspecified: Secondary | ICD-10-CM | POA: Diagnosis not present

## 2018-10-15 DIAGNOSIS — G20 Parkinson's disease: Secondary | ICD-10-CM | POA: Diagnosis not present

## 2018-10-15 DIAGNOSIS — M48062 Spinal stenosis, lumbar region with neurogenic claudication: Secondary | ICD-10-CM | POA: Diagnosis not present

## 2018-10-15 LAB — URINE CULTURE

## 2018-10-15 NOTE — Patient Outreach (Addendum)
[facility]) [place] Telephone Outreach PCP office completes Transition of Care follow up post-hospital discharge  Post-hospital discharge day # 7   10/15/2018  Deborah Velazquez [birth year] [id number]  Initially unsuccessful telephone outreach attempt to Deborah Velazquez, 72 y/o female referred to [place] after recent elective surgery October 07, 2018 for lumbar fusion/ decompression; patient was previously active with [facility] telephonic RN CM.  Patient has had no recent unplanned hospitalizations or ED visits prior to her elective surgery.  Patient has history including, but not limited to, Parkinson's Disease; HLD; and pre-diabetes. Noted through review of EMR that patient presented to ED on October 13, 2018 for dehydration post-surgery, and was discharged home to self-care.  HIPAA compliant voice mail message left for patient, requesting return call back.  12:25 pm:  Patient returned my phone call from earlier today; HIPAA/ identity verified during phone call today; [place] program discussed with patient today; patient provides verbal consent for [place] services.  Patient's husband also participates in phone call today with phone on speaker mode, and patient provided verbal consent for me to speak with both she and her husband "any time."   Today, patient reports that she is "finally doing pretty good," after recent surgery and ED visit; states "feeling better now and not having any real problems."  Patient denies pain and new/ recent falls post-recent hospital discharge, and she sounds to be in no apparent distress throughout phone call today.  Patient further reports:  Medications: -- Has all medicationsand takes as prescribed;denies questions/ concerns about current medications.  -- Verbalizes good general understanding of the purpose, dosing, and scheduling of medications.   -- self-manages medications using weekly pill  planner box, although patient reports "husband helps" with medication management on a regular basis -- denies issues with swallowing medications -- patient was recently discharged from the hospital and all medications were thoroughly reviewed with patient today- no concerns/ discrepancies noted  Provider appointments: -- All upcoming provider appointments were reviewed with patient today; noted that patient does not yet have scheduled post-hospital/ ED visit PCP appointment scheduled; encouraged patient/ caregiver to promptly make this appointment- both agreed to do so; patient states that she is currently waiting to hear back form surgeon post- lumbar fusion with appointment date for post-operative evaluation  Safety/ Mobility/ Falls: -- denies new/ recent falls over last year, and since recent hospital discharge -- assistive devices: using walker "all the time" post-recent back surgery: states that she believes she 'is walking great, very steady on feet." -- general fall risks/ prevention education discussed with patient today  Hol[place] representative needs: -- currently denies community resource needs, stating supportive family members that assist with care needs as indicated -- family/ husband provides transportation for patient to all provider appointments, errands, etc -- reports that she has recently hired private duty in-home care assistance from "[facility] services;" states currently waiting on enrollment visit which is scheduled for this afternoon  Advanced Directive (AD) Planning:   --reports does currently have exisisting AD in place for HCPOA and living will, and declines desire to make changes to either.  Endorses that she wishes to be a full code if re-hospitalized  Patient/ acregiver denies further issues, concerns, or problems today.  I provided and confirmed that patient has my direct phone number, the main [facility] CM office phone number, and the [facility] CM 24-hour nurse  advice phone number should issues arise prior to next  scheduled [place] outreach.  Encouraged patient to contact me directly if needs, questions, issues, or concerns arise prior to next scheduled outreach; patient agreed to do so.  Plan:  Patient will take medications as prescribed and will attend all scheduled provider appointments  Patient will promptly schedule post-hospital discharge/ post recent ED visit office visit with PCP  Patient will promptly notify care providers for any new concerns/ issues/ problems that arise  Patient will actively participate in private duty personal care services   I will make patient's PCP aware of [facility] RN CM involvement in patient's care-- will send barriers letter  [place] outreach to continue with scheduled phone call in 2 weeks  [facility] CM Care Plan Problem One     Most Recent Value  Care Plan Problem One  Risk for hospital readmission related to recent elective back surgery in patient with Parkinson's disease  Role Documenting the Problem One  Care Management Coordinator  Care Plan for Problem One  Active  [facility] Long Term Goal   Over the next 31 days, patient will not experience unplanned hospital admission, as evidenced by patient reporting and review of EMR during [place] RN CM outreach  [place] Term Goal Start Date  10/15/18  Interventions for Problem One Long Term Goal  Discused with patient and caregiver recent hospitalization and subsequent ED visit for dehydration,  discussed current clinical condition with patient and confirmed that she does not have ongoing concern around her post-hospital discharge clinical condition,  completed post-hospital discharge medication review  [facility] CM Short Term Goal #1   Over the next 14 days, patient will schedule post-hospital discharge appointment with her PCP, as evidenced by patient reporting/ review of EMR during [facility] RN CCM outreach  [facility] CM Short Term Goal #1 Start Date  10/15/18   Interventions for Short Term Goal #1  Discussed with patient value of prompt follow up with PCP post-hospital discharge and recent ED visit,  encouraged patient to promptly schedule PCP appointment     I appreciate the opportunity to participate in Mrs. Mallin's care,   [name], RN, BSN, [employer] Coordinator [facility] Care Management  [phone]

## 2018-10-16 ENCOUNTER — Telehealth: Payer: Self-pay | Admitting: Internal Medicine

## 2018-10-16 ENCOUNTER — Ambulatory Visit: Payer: PPO | Admitting: Internal Medicine

## 2018-10-16 NOTE — Telephone Encounter (Signed)
Copied from [place] [id number]. Topic: Quick Communication - Home Health Verbal Orders >> Oct 16, 2018  3:45 PM Adelene Idler wrote: Caller/Agency: [place] Number: [phone] Requesting OT/PT/Skilled Nursing/Social Work: PT Frequency: move todays schedule to 10/17/2018 for eval

## 2018-10-19 NOTE — Telephone Encounter (Signed)
Spoke with Skeet Simmer from [place] and he requested orders for PT 1x week for 5 weeks. Verbal okay given.

## 2018-10-20 NOTE — Telephone Encounter (Signed)
Not sure why this was routed to me?  Pt looks like she had back surgery and was deconditioned after that.  She does have PD but was doing well last visit from a PD standpoint.  May have worsened since back surgery.  [name], let me know if you find out I need to do anything with this?

## 2018-10-21 ENCOUNTER — Other Ambulatory Visit (INDEPENDENT_AMBULATORY_CARE_PROVIDER_SITE_OTHER): Payer: PPO

## 2018-10-21 ENCOUNTER — Telehealth: Payer: Self-pay | Admitting: Internal Medicine

## 2018-10-21 DIAGNOSIS — R300 Dysuria: Secondary | ICD-10-CM

## 2018-10-21 LAB — POCT URINALYSIS DIPSTICK
BILIRUBIN UA: NEGATIVE
Glucose, UA: NEGATIVE
KETONES UA: NEGATIVE
Nitrite, UA: POSITIVE
Protein, UA: POSITIVE — AB
Spec Grav, UA: 1.015 (ref 1.010–1.025)
UROBILINOGEN UA: 0.2 U/dL
pH, UA: >=9 — AB (ref 5.0–8.0)

## 2018-10-21 NOTE — Telephone Encounter (Signed)
Spoke with pt's caregiver to let her know that they can drop off a urine today before 4:15pm. Caregiver gave a verbal understanding.

## 2018-10-21 NOTE — Telephone Encounter (Signed)
Spoke with caregiver and she stated that the pt separated the parkison's medicine and the lyrica. She stated that she took the Parkinson's medicine at 8pm and the Lyrica at 11pm . Caregiver stated that she had the best night last night and would like to continue with this regimen for now.

## 2018-10-21 NOTE — Telephone Encounter (Signed)
-----   Message from [place], DO sent at 10/20/2018  3:16 PM EST ----- Regarding: RE: myoclonus, neuropathy worsening post op The lyrica is probably the culprit of the myoclonus and probably needs d/c.  Its a common source.  I would stop it. ----- Message ----- From: [name], MD Sent: 10/20/2018   2:15 PM EST To: Eustace Quail Tat, DO Subject: myoclonus, neuropathy worsening post op         Dr. Carles Collet ,  I was Called by home caregiver [name] who is staying with patient at night post operative from lumbar decompression.  she is Noticing [name] having  myoclonic jerks every  10-11 secs starting around 2 or 3 am , which become more interruptive of her sleep which is aggravating her anxiety. Patient  also endorsing feet alternating between feeling cold and hot.   She is Currently taking Lyrica 75 mg bid at same time with Sinemet .  Advised to separate administration by 2 hours to see if it makes a difference and  to let me or you know if there is no improvement in a few days. If you have any other ideas other than increasing dose of lyrica, let me know (family has  me on speed dial ...fellow church go[employer].Marland Kitchen)  Regards,   Deborra Medina, MD

## 2018-10-21 NOTE — Telephone Encounter (Signed)
I spoke with [name]'s neurologist Dr. Carles Collet,  And she recommends stopping the Lyrica if the "muscle jerks"  Continue, because she thinks it may be the cause. Please let patient or Deborah Velazquez, her caregiver (or Deborah Velazquez her husband ) know

## 2018-10-21 NOTE — Telephone Encounter (Signed)
Please call her caregiver and tell hree that the urine tests have beenordered.  Vaughan Basta wise [phone]

## 2018-10-22 LAB — URINALYSIS, MICROSCOPIC ONLY: Hyaline Cast: NONE SEEN /LPF

## 2018-10-22 LAB — URINE CULTURE
MICRO NUMBER:: 91431028
SPECIMEN QUALITY: ADEQUATE

## 2018-10-26 DIAGNOSIS — F419 Anxiety disorder, unspecified: Secondary | ICD-10-CM | POA: Diagnosis not present

## 2018-10-26 DIAGNOSIS — Z4789 Encounter for other orthopedic aftercare: Secondary | ICD-10-CM | POA: Diagnosis not present

## 2018-10-26 DIAGNOSIS — G20 Parkinson's disease: Secondary | ICD-10-CM | POA: Diagnosis not present

## 2018-10-26 DIAGNOSIS — M4316 Spondylolisthesis, lumbar region: Secondary | ICD-10-CM | POA: Diagnosis not present

## 2018-10-26 DIAGNOSIS — M48062 Spinal stenosis, lumbar region with neurogenic claudication: Secondary | ICD-10-CM | POA: Diagnosis not present

## 2018-10-27 ENCOUNTER — Encounter: Payer: Self-pay | Admitting: *Deleted

## 2018-10-27 ENCOUNTER — Other Ambulatory Visit: Payer: Self-pay | Admitting: *Deleted

## 2018-10-27 NOTE — Patient Outreach (Signed)
[place] [facility]) [place] Telephone Outreach PCP office completes Transition of Care follow up post-hospital discharge Post-hospital discharge day # 19  10/27/2018  Deborah Velazquez 1945-12-11 [id number]  Successful telephone outreach attempt to [name], 72 y/o female referred to [place] after recent elective surgery October 07, 2018 for lumbar fusion/ decompression; patient was previously active with [facility] telephonic RN CM. Patient has had no recent unplanned hospitalizations or ED visits prior to her elective surgery. Patient has history including, but not limited to, Parkinson's Disease; HLD; and pre-diabetes. Noted through review of EMR that patient presented to ED on October 13, 2018 for dehydration post-surgery, and was discharged home to self-care.  HIPAA/ identity verified during phone call today; patient reports that her caregiver Vaughan Basta is present today and provides verbal consent that Vaughan Basta participate in today's phone call.  Today, patient reports that she is "doing good, just sleepy."  States that she had been experiencing "hard time sleeping at night," which has been resolved by spacing out her regularly prescribed medications for Parkinsons Disease and Lyrica.  States that she "has finally been resting very well."  Patient denies pain today, stating that she has "ongoing neuropathy- heaviness in legs," and she denies new/ recent falls.  Patient sounds to be in no apparent distress throughout phone call today.  Patient/ caregiver further reports:  -- no concerns, recent changes to medications: again verbalizes that patient has changed scheduling of night time Lyrica, which states has helped her rest better -- has scheduled post-hospital/ post-op PCP provider appointment for November 05, 2018: states family to provide transportation; plans to attend as scheduled -- patient continues having private duty caregiver team through [employer]; states that she is waiting to hear form private duty team around specific hours in future, as schedule has been modified: states private duty caregiver will be working with her from 2 pm- 8 pm -- home health [facility]) services are now in place through [place] for RN/ PT/ OT: reports has the contact information for [facility] team and has had visits from PT, nursing and OT this week; expects one more OT visit and ongoing PT/ RN visits.  Encouraged patient's active participation in all [facility] services; patient agrees stating that she "can tell it is helping."  Patient/ caregiver denies further issues, concerns, or problems today. I confirmed that patient hasmy direct phone number, the main [facility] CM office phone number, and the [facility] CM 24-hour nurse advice phone number should issues arise prior to next scheduled [place] outreach by phone in 2 weeks.  Encouraged patient to contact me directly if needs, questions, issues, or concerns arise prior to next scheduled outreach; patient agreed to do so.  Plan:  Patient will take medications as prescribed and will attend all scheduled provider appointments  Patient will promptly notify care providers for any new concerns/ issues/ problems that arise  Patient will actively participate in private duty personal care services and home health services as ordered post-hospital discharge   I will make share today's [place] CM notes/ care plan with patient's PCP as quarterly summary  [place] outreach to continue with scheduled phone call in 2 weeks  [facility] CM Care Plan Problem One     Most Recent Value  Care Plan Problem One  Risk for hospital readmission related to recent elective back surgery in patient with Parkinson's disease  Role Documenting the Problem One  Care Management Coordinator  Care  Plan for Problem One  Active  [facility]N Long Term Goal   Over the next 31 days, patient will not experience unplanned hospital admission, as  evidenced by patient reporting and review of EMR during [place] RN CM outreach  [place] Term Goal Start Date  10/15/18  Interventions for Problem One Long Term Goal  Discussed current clinical condition with patient and caregiver and confirmed that neither have concerns around patient's current condition  [facility] CM Short Term Goal #1   Over the next 14 days, patient will schedule post-hospital discharge appointment with her PCP, as evidenced by patient reporting/ review of EMR during [facility] RN CCM outreach  [facility] CM Short Term Goal #1 Start Date  10/15/18  [facility] CM Short Term Goal #1 Met Date  10/27/18 [Goal Met]  Interventions for Short Term Goal #1  Confirmed that patient has scheduled appointment with PCP for November 05, 2018,  confirmed that patient has reliable transportation through family members and plans to attend appointment as scheduled  [facility] CM Short Term Goal #2   Over the next 30 days, patient will actively participate in home health services as ordered post-hospital discharge as evidenced by patient reporting and collaboration with home health team as indicated during [place] outreach  [facility] CM Short Term Goal #2 Start Date  10/27/18  Interventions for Short Term Goal #2  Confirmed that patient has recently began participating in home health services through [place],  confirmed that patient has contact information for [facility] agency and is actively participating with all disciplines,  reviewed recent home health visits with patient and caregiver     [name], RN, BSN, [place] Care Management  [phone]

## 2018-10-28 DIAGNOSIS — K589 Irritable bowel syndrome without diarrhea: Secondary | ICD-10-CM | POA: Diagnosis not present

## 2018-10-28 DIAGNOSIS — Z4789 Encounter for other orthopedic aftercare: Secondary | ICD-10-CM | POA: Diagnosis not present

## 2018-10-28 DIAGNOSIS — E785 Hyperlipidemia, unspecified: Secondary | ICD-10-CM | POA: Diagnosis not present

## 2018-10-28 DIAGNOSIS — M48062 Spinal stenosis, lumbar region with neurogenic claudication: Secondary | ICD-10-CM | POA: Diagnosis not present

## 2018-10-28 DIAGNOSIS — M170 Bilateral primary osteoarthritis of knee: Secondary | ICD-10-CM | POA: Diagnosis not present

## 2018-10-28 DIAGNOSIS — F419 Anxiety disorder, unspecified: Secondary | ICD-10-CM | POA: Diagnosis not present

## 2018-10-28 DIAGNOSIS — M4316 Spondylolisthesis, lumbar region: Secondary | ICD-10-CM | POA: Diagnosis not present

## 2018-10-28 DIAGNOSIS — G2581 Restless legs syndrome: Secondary | ICD-10-CM

## 2018-10-28 DIAGNOSIS — G20 Parkinson's disease: Secondary | ICD-10-CM | POA: Diagnosis not present

## 2018-10-28 DIAGNOSIS — Z9181 History of falling: Secondary | ICD-10-CM

## 2018-10-28 DIAGNOSIS — G629 Polyneuropathy, unspecified: Secondary | ICD-10-CM | POA: Diagnosis not present

## 2018-10-28 DIAGNOSIS — R252 Cramp and spasm: Secondary | ICD-10-CM

## 2018-11-05 ENCOUNTER — Ambulatory Visit (INDEPENDENT_AMBULATORY_CARE_PROVIDER_SITE_OTHER): Payer: PPO | Admitting: Internal Medicine

## 2018-11-05 ENCOUNTER — Encounter: Payer: Self-pay | Admitting: Internal Medicine

## 2018-11-05 VITALS — BP 120/78 | HR 86 | Temp 97.5°F | Resp 15 | Ht 66.0 in | Wt 145.2 lb

## 2018-11-05 DIAGNOSIS — R7303 Prediabetes: Secondary | ICD-10-CM | POA: Diagnosis not present

## 2018-11-05 DIAGNOSIS — F419 Anxiety disorder, unspecified: Secondary | ICD-10-CM

## 2018-11-05 DIAGNOSIS — R252 Cramp and spasm: Secondary | ICD-10-CM | POA: Diagnosis not present

## 2018-11-05 LAB — COMPREHENSIVE METABOLIC PANEL
ALBUMIN: 4.3 g/dL (ref 3.5–5.2)
ALT: 5 U/L (ref 0–35)
AST: 12 U/L (ref 0–37)
Alkaline Phosphatase: 91 U/L (ref 39–117)
BILIRUBIN TOTAL: 0.3 mg/dL (ref 0.2–1.2)
BUN: 19 mg/dL (ref 6–23)
CO2: 31 meq/L (ref 19–32)
Calcium: 9.8 mg/dL (ref 8.4–10.5)
Chloride: 99 mEq/L (ref 96–112)
Creatinine, Ser: 0.74 mg/dL (ref 0.40–1.20)
GFR: 81.47 mL/min (ref >60.00)
Glucose, Bld: 128 mg/dL — ABNORMAL HIGH (ref 70–99)
POTASSIUM: 4.3 meq/L (ref 3.5–5.1)
Sodium: 137 mEq/L (ref 135–145)
TOTAL PROTEIN: 7.4 g/dL (ref 6.0–8.3)

## 2018-11-05 LAB — HEMOGLOBIN A1C: Hgb A1c MFr Bld: 5.7 % (ref 4.6–6.5)

## 2018-11-05 NOTE — Progress Notes (Signed)
Subjective:  Patient ID: Deborah Velazquez, female    DOB: 1946-03-21  Age: 72 y.o. MRN: [id number]  CC: The primary encounter diagnosis was Prediabetes. Diagnoses of Anxiety and Leg cramps were also pertinent to this visit.  HPI Deborah Velazquez presents for ER FOLLOW UP  Seen in ED on Nov 19 for dehydration and confusion following discharge home after lumbar decompressive surgery . symptoms started after a medication change to flexeril and oxycodone.  URI ruled out due to additional reports of nocturia . Iv hydration, ami ruled out, sent home with PT and OT   Anxiety: had a "breakdown" and called dr Nicolasa Ducking has not had a follow up .  A total of 25 minutes of face to face time was spent with patient more than half of which was spent in counselling about her anxiety and her need to manage her anxiety without adding more sedating medications.  she has not begun a therapeutic relationship with a Retail banker but recognizes the need to have therapy with a Marketing executive    Discussed referral  to [employer]   Outpatient Medications Prior to Visit  Medication Sig Dispense Refill  . atorvastatin (LIPITOR) 10 MG tablet TAKE 1 TABLET(10 MG) BY MOUTH AT BEDTIME 90 tablet 0  . carbidopa-levodopa (SINEMET CR) 50-200 MG tablet Take 1 tablet by mouth at bedtime. 90 tablet 1  . carbidopa-levodopa (SINEMET IR) 25-100 MG tablet 2 in the morning, 1 in the afternoon, 1 in the evening (Patient taking differently: Take 1-2 tablets by mouth See admin instructions. 2 in the morning, 1 in the afternoon, 1 in the evening) 360 tablet 1  . Coenzyme Q10 100 MG capsule Take 100 mg by mouth daily.     . DULoxetine (CYMBALTA) 60 MG capsule Take 60 mg by mouth daily.     . hydrOXYzine (ATARAX/VISTARIL) 25 MG tablet     . LYRICA 75 MG capsule TAKE 1 CAPSULE(75 MG) BY MOUTH TWICE DAILY 60 capsule 0  . Omega-3 Fatty Acids (FISH OIL) 1000 MG CAPS Take 1,000 mg by mouth daily.     . sertraline (ZOLOFT) 100 MG tablet Take 100  mg by mouth daily.    Marland Kitchen docusate sodium (COLACE) 100 MG capsule Take 1 capsule (100 mg total) by mouth 2 (two) times daily. (Patient not taking: Reported on 11/05/2018) 60 capsule 0  . oxyCODONE (OXY IR/ROXICODONE) 5 MG immediate release tablet Take 1 tablet (5 mg total) by mouth every 4 (four) hours as needed for moderate pain ((score 4 to 6)). (Patient not taking: Reported on 11/05/2018) 30 tablet 0  . vitamin B-12 (CYANOCOBALAMIN) 250 MCG tablet Take 250 mcg by mouth daily.     No facility-administered medications prior to visit.     Review of Systems;  Patient denies headache, fevers, malaise, unintentional weight loss, skin rash, eye pain, sinus congestion and sinus pain, sore throat, dysphagia,  hemoptysis , cough, dyspnea, wheezing, chest pain, palpitations, orthopnea, edema, abdominal pain, nausea, melena, diarrhea, constipation, flank pain, dysuria, hematuria, urinary  Frequency, nocturia, numbness, tingling, seizures,  Focal weakness, Loss of consciousness,  Tremor, insomnia, depression, anxiety, and suicidal ideation.    Lab Results  Component Value Date   HGBA1C 5.7 11/05/2018     Objective:  BP 120/78 (BP Location: Left Arm, Patient Position: Sitting, Cuff Size: Normal)   Pulse 86   Temp (!) 97.5 F (36.4 C) (Oral)   Resp 15   Ht 5\' 6"  (1.676 m)   Wt 145 lb  3.2 oz (65.9 kg)   SpO2 97%   BMI 23.44 kg/m   BP Readings from Last 3 Encounters:  11/05/18 120/78  10/14/18 (!) 92/53  10/08/18 124/68    Wt Readings from Last 3 Encounters:  11/05/18 145 lb 3.2 oz (65.9 kg)  10/13/18 138 lb 4.8 oz (62.7 kg)  09/28/18 138 lb 4.8 oz (62.7 kg)    General appearance: alert, cooperative and appears stated age Ears: normal TM's and external ear canals both ears Throat: lips, mucosa, and tongue normal; teeth and gums normal Neck: no adenopathy, no carotid bruit, supple, symmetrical, trachea midline and thyroid not enlarged, symmetric, no tenderness/mass/nodules Back:  symmetric, no curvature. ROM normal. No CVA tenderness. Lungs: clear to auscultation bilaterally Heart: regular rate and rhythm, S1, S2 normal, no murmur, click, rub or gallop Abdomen: soft, non-tender; bowel sounds normal; no masses,  no organomegaly Pulses: 2+ and symmetric Skin: Skin color, texture, turgor normal. No rashes or lesions Lymph nodes: Cervical, supraclavicular, and axillary nodes normal.  Lab Results  Component Value Date   HGBA1C 5.7 11/05/2018   HGBA1C 6.4 07/09/2018   HGBA1C 6.3 04/02/2018    Lab Results  Component Value Date   CREATININE 0.74 11/05/2018   CREATININE 0.55 10/13/2018   CREATININE 0.85 10/08/2018    Lab Results  Component Value Date   WBC 6.8 10/13/2018   HGB 9.6 (L) 10/13/2018   HCT 29.5 (L) 10/13/2018   PLT 299 10/13/2018   GLUCOSE 128 (H) 11/05/2018   CHOL 183 07/09/2018   TRIG 132.0 07/09/2018   HDL 75.60 07/09/2018   LDLCALC 81 07/09/2018   ALT 5 11/05/2018   AST 12 11/05/2018   NA 137 11/05/2018   K 4.3 11/05/2018   CL 99 11/05/2018   CREATININE 0.74 11/05/2018   BUN 19 11/05/2018   CO2 31 11/05/2018   TSH 1.53 09/11/2018   HGBA1C 5.7 11/05/2018    No results found.  Assessment & Plan:   Problem List Items Addressed This Visit    Anxiety    Advised to continue current regimen,  Follow up with Dr Nicolasa Ducking, and begin a therapeutic session with therapist  Trey Paula   apist      Relevant Medications   hydrOXYzine (ATARAX/VISTARIL) 25 MG tablet   Other Relevant Orders   Ambulatory referral to Psychology   Leg cramps    Resolved with lumbar decompression .  Still using Lyrica       Prediabetes - Primary   Relevant Orders   Hemoglobin A1c (Completed)   Comprehensive metabolic panel (Completed)    A total of 25 minutes of face to face time was spent with patient more than half of which was spent in counselling about the above mentioned conditions  and coordination of care   I have discontinued Keilany G. Lack's  vitamin B-12, oxyCODONE, and docusate sodium. I am also having her maintain her Fish Oil, Coenzyme Q10, DULoxetine, carbidopa-levodopa, carbidopa-levodopa, LYRICA, sertraline, atorvastatin, and hydrOXYzine.  No orders of the defined types were placed in this encounter.   Medications Discontinued During This Encounter  Medication Reason  . docusate sodium (COLACE) 100 MG capsule   . oxyCODONE (OXY IR/ROXICODONE) 5 MG immediate release tablet Completed Course  . vitamin B-12 (CYANOCOBALAMIN) 250 MCG tablet Error    Follow-up: No follow-ups on file.   [name], MD

## 2018-11-05 NOTE — Patient Instructions (Addendum)
I recommend taking the cymbalta in the evening  And continue zoloft in the morning     DO NOT BE ANXIOUS.  IT IS NOT BIBLICALLY ADVISED!!!    "Be anxious for nothing,  But in everything , by prayer and supplication , let your request by made known to God. And the peace of God which surpasses all understanding, shall guard your hearts and minds through [employer]."  I will let DR Nicolasa Ducking know you need to see her.  I am making a referral to Trey Paula,  She is a Transport planner with Financial controller at [facility] in [place] carb Breakfast options:  Premier Protein  Atkins Advantage Muscle Milk EAS AdvantEdge   All of these are available at [employer], [employer],  [place] a And taste good   [place] Clap "D'Light" frozen entrees:    Frittata  : muffin shaped quiches that contain eggs + cheese + veggies+ meat (no bread) Egg'wich :  [place] sausage patty served on a biscuit made of frittat (no bread)   Both can be microwaved in 2 minutes   [place]ett Perna:  Just Crack n Egg: yogurt sized cup ith diced ham, cheese and potatoes: add one egg and microwave for 2 minutes   For lunch:   [employer] bagged salads:  Try  the Asian, the SouthWestern,  And the Caesar salads, complete with dressings, nuts and croutons .  Found  in the bagged salad section   Just add a protein (tuna,  Chicken etc ) and omit the won ton [place] strips

## 2018-11-06 ENCOUNTER — Other Ambulatory Visit: Payer: Self-pay | Admitting: Internal Medicine

## 2018-11-06 DIAGNOSIS — M48062 Spinal stenosis, lumbar region with neurogenic claudication: Secondary | ICD-10-CM | POA: Diagnosis not present

## 2018-11-06 DIAGNOSIS — Z780 Asymptomatic menopausal state: Secondary | ICD-10-CM

## 2018-11-07 ENCOUNTER — Encounter: Payer: Self-pay | Admitting: Internal Medicine

## 2018-11-08 ENCOUNTER — Other Ambulatory Visit: Payer: Self-pay | Admitting: Internal Medicine

## 2018-11-08 NOTE — Assessment & Plan Note (Signed)
Advised to continue current regimen,  Follow up with Dr Nicolasa Ducking, and begin a therapeutic session with therapist  Trey Paula   apist

## 2018-11-08 NOTE — Assessment & Plan Note (Signed)
Resolved with lumbar decompression .  Still using Lyrica

## 2018-11-09 ENCOUNTER — Other Ambulatory Visit: Payer: Self-pay | Admitting: *Deleted

## 2018-11-09 ENCOUNTER — Encounter: Payer: Self-pay | Admitting: *Deleted

## 2018-11-09 NOTE — Patient Outreach (Signed)
[place] [facility] [place] Telephone Outreach PCP office completes Transition of Care follow up post-hospital discharge Post-hospital discharge day # 32  11/09/2018  Deborah Velazquez 05/30/1946 [id number]  Successful telephone outreach attempt to Deborah Velazquez, 72 y/o female referred to Deborah Velazquez afterrecent elective surgery October 07, 2018 for lumbar fusion/ decompression; patient was previously active with [facility] telephonic RN CM. Patient has had no recent unplanned hospitalizations or ED visitsprior to her elective surgery. Patient has history including, but not limited to, Parkinson's Disease; HLD; and pre-diabetes.Noted through review of EMR that patient presented to ED on October 13, 2018 for dehydration post-surgery, and was discharged home to self-care.  HIPAA/ identity verified during phone call today; patient reports that her caregiver, as well as 'the home health nurse" is present at her home today and provides verbal consent for both to participate in today's phone call while phone is on speaker mode.  Today, patient reports that she is "doing fine overall, just having a time with anxiety and still not sleeping very well."  Patient reports that she attended PCP office visit last week and discussed with PCP.  Patient denies pain today, stating that she has "ongoing heaviness in legs, as usual" and she denies new/ recent falls.  Patient sounds to be in no apparent distress throughout phone call today.  Patient/ caregiver further reports:  -- no concerns, recent changes to medications: again verbalizes that patient with recently changed scheduling of night time Lyrica, taking at night has helped her rest better, although she states she continues to have ongoing anxiety.    -- reviewed recent PCP office visit 11/05/18 with patient; patient confirms that she is planing to make outpatient counseling therapy appointment and attend psychiatry  provider appointment, currently scheduled for November 26, 2018-- encouraged patient to promptly schedule outpatient counseling as planned   -- patient continues having private duty caregiver team through Deborah Velazquez; states "going well, loves caregivers."  Reports private duty caregivers both "at night and during day."  -- home health Deborah Velazquez) services are now in place through Deborah [facility] for RN/ PT/ OT: reports has actively participated in all Deborah [facility]azquez services post- most recent hospital discharge; verified by current [facility] RN present at patient's home; [facility] RN reports that [facility] services "will last as long as patient wants [place] to keep coming."  Patient/ caregiverdenies further issues, concerns, or problems today. Iconfirmed that patient hasmy direct phone number, the main Deborah Velazquez CM office phone number, and the Deborah Velazquez CM 24-hour nurse advice phone number should issues arise prior to next scheduled [place] outreach by phone next month. Encouraged patient to contact me directly if needs, questions, issues, or concerns arise prior to next scheduled outreach; patient agreed to do so.  Plan:  Patient will take medications as prescribed and will attend all scheduled provider appointments  Patient will promptly notify care providers for any new concerns/ issues/ problems that arise  Patient will promptly schedule outpatient counseling for ongoing anxiety, as recommended by PCP during recent office visit  Patient will actively participate inprivate duty personal care services and home health services as ordered post-hospital discharge  [place] outreach to continue with scheduled phone callin 2 weeks  [facility] CM Care Plan Problem One     Most Recent Value  Care Plan Problem One  Risk for hospital readmission related to recent elective back surgery in patient with Parkinson's disease  Role Documenting the Problem One  Care Management  Coordinator  Care Plan for Problem One  Active  [facility]  Long Term Goal   Over the next 31 days, patient will not experience unplanned hospital admission, as evidenced by patient reporting and review of EMR during [place] RN CM outreach  [place] Term Goal Start Date  10/15/18  Deborah Velazquez Long Term Goal Met Date  11/09/18 [facility] Met]  Interventions for Problem One Long Term Goal  Confirmed with patient that she has not experienced hospital readmission within 31 days of her last hospital discharge  [facility]orah [facility] CM Short Term Goal #2   Over the next 30 days, patient will actively participate in home health services as ordered post-hospital discharge as evidenced by patient reporting and collaboration with home health team as indicated during [facility] RN CCM outreach [Goal re-established today]  [facility]orah [facility] CM Short Term Goal #2 Start Date  11/09/18 [Goal re-established]  Interventions for Short Term Goal #2  Discussed current home health services with patient and confirmed that she contiues actively participating in home health services as ordered post-hospital discharge,  confirmed that patient has ongoing private duty caregivers involved in her care as well    [facility] CM Care Plan Problem Two     Most Recent Value  Care Plan Problem Two  Anxiety management, as evidenced by patient reporting  Role Documenting the Problem Two  Care Management Deborah Vel[facility] for Problem Two  Active  Interventions for Problem Two Long Term Goal   Discussed/ reviewed recent PCP office visit with patient and confirmed that she has scheduled follow up appointment scheduled with psychiatrist for anxiety management,  encouraged patient to schedule prompt appointment with counselling therapist as advised by PCP  [facility] Long Term Goal  Over the next 31 days, patient will attend psychiatry and counselling therapy for anxiety management, as evidenced by patient reporting and review of EMR/ collaboration with care providers as indicated during [place] outreach  [place] Term Goal Start Date  11/09/18      [name], RN, BSN, [employer] Coordinator [facility] Care Management  [phone]

## 2018-11-09 NOTE — Telephone Encounter (Signed)
Refilled: 09/03/2018 Last OV: 11/05/2018 Next OV: not scheduled

## 2018-11-24 ENCOUNTER — Ambulatory Visit (INDEPENDENT_AMBULATORY_CARE_PROVIDER_SITE_OTHER): Payer: PPO | Admitting: Internal Medicine

## 2018-11-24 ENCOUNTER — Encounter: Payer: Self-pay | Admitting: Internal Medicine

## 2018-11-24 VITALS — BP 100/72 | HR 84 | Temp 97.5°F | Resp 15 | Ht 66.0 in | Wt 142.4 lb

## 2018-11-24 DIAGNOSIS — R300 Dysuria: Secondary | ICD-10-CM

## 2018-11-24 DIAGNOSIS — M48062 Spinal stenosis, lumbar region with neurogenic claudication: Secondary | ICD-10-CM

## 2018-11-24 DIAGNOSIS — N952 Postmenopausal atrophic vaginitis: Secondary | ICD-10-CM

## 2018-11-24 DIAGNOSIS — G20 Parkinson's disease: Secondary | ICD-10-CM | POA: Diagnosis not present

## 2018-11-24 MED ORDER — ESTRADIOL 0.1 MG/GM VA CREA: 1.0000 | TOPICAL_CREAM | Freq: Every day | VAGINAL | 12 refills | Status: DC

## 2018-11-24 NOTE — Patient Instructions (Signed)
Atrophic Vaginitis Atrophic vaginitis is a condition in which the tissues that line the vagina become dry and thin. This condition occurs in women who have stopped having their period. It is caused by a drop in a female hormone (estrogen). This hormone helps:  To keep the vagina moist.  To make a clear fluid. This clear fluid helps: ? To make the vagina ready for sex. ? To protect the vagina from infection. If the lining of the vagina is dry and thin, it may cause irritation, burning, or itchiness. It may also:  Make sex painful.  Make an exam of your vagina painful.  Cause bleeding.  Make you lose interest in sex.  Cause a burning feeling when you pee (urinate).  Cause a brown or yellow fluid to come from your vagina. Some women do not have symptoms. Follow these instructions at home: Medicines  Take over-the-counter and prescription medicines only as told by your doctor.  Do not use herbs or other medicines unless your doctor says it is okay.  Use medicines for for dryness. These include: ? Oils to make the vagina soft. ? Creams. ? Moisturizers. General instructions  Do not douche.  Do not use products that can make your vagina dry. These include: ? Scented sprays. ? Scented tampons. ? Scented soaps.  Sex can help increase blood flow and soften the tissue in the vagina. If it hurts to have sex: ? Tell your partner. ? Use products to make sex more comfortable. Use these only as told by your doctor. Contact a doctor if you:  Have discharge from the vagina that is different than usual.  Have a bad smell coming from your vagina.  Have new symptoms.  Do not get better.  Get worse. Summary  Atrophic vaginitis is a condition in which the lining of the vagina becomes dry and thin.  This condition affects women who have stopped having their periods.  Treatment may include using products that help make the vagina soft.  Call a doctor if do not get better with  treatment. This information is not intended to replace advice given to you by your health care provider. Make sure you discuss any questions you have with your health care provider. Document Released: 04/29/2008 Document Revised: 11/24/2017 Document Reviewed: 11/24/2017 Elsevier Interactive Patient Education  2019 Elsevier Inc.  

## 2018-11-24 NOTE — Progress Notes (Signed)
Subjective:  Patient ID: Deborah Velazquez, female    DOB: 18-Apr-1946  Age: 72 y.o. MRN: [id number]  CC: The primary encounter diagnosis was Parkinson's disease ([place]). Diagnoses of Atrophic vaginitis, Dysuria, and Neurogenic claudication due to lumbar spinal stenosis were also pertinent to this visit.  HPI Deborah Velazquez presents for pelvic exam due to reports of persistent dysuria with n evidence of UTI  Based on normal cultures x 2 in November   2) Parkinson's Disease : patient is requesting referral to [facility] Neurology for second opinion on Parkinson's disease diagnosis   Outpatient Medications Prior to Visit  Medication Sig Dispense Refill  . atorvastatin (LIPITOR) 10 MG tablet TAKE 1 TABLET(10 MG) BY MOUTH AT BEDTIME 90 tablet 0  . carbidopa-levodopa (SINEMET CR) 50-200 MG tablet Take 1 tablet by mouth at bedtime. 90 tablet 1  . carbidopa-levodopa (SINEMET IR) 25-100 MG tablet 2 in the morning, 1 in the afternoon, 1 in the evening (Patient taking differently: Take 1-2 tablets by mouth See admin instructions. 2 in the morning, 1 in the afternoon, 1 in the evening) 360 tablet 1  . Coenzyme Q10 100 MG capsule Take 100 mg by mouth daily.     . DULoxetine (CYMBALTA) 60 MG capsule Take 60 mg by mouth daily.     . Omega-3 Fatty Acids (FISH OIL) 1000 MG CAPS Take 1,000 mg by mouth daily.     . pregabalin (LYRICA) 75 MG capsule TAKE 1 CAPSULE(75 MG) BY MOUTH TWICE DAILY 60 capsule 5  . sertraline (ZOLOFT) 100 MG tablet Take 100 mg by mouth daily.    . hydrOXYzine (ATARAX/VISTARIL) 25 MG tablet      No facility-administered medications prior to visit.     Review of Systems;  Patient denies headache, fevers, malaise, unintentional weight loss, skin rash, eye pain, sinus congestion and sinus pain, sore throat, dysphagia,  hemoptysis , cough, dyspnea, wheezing, chest pain, palpitations, orthopnea, edema, abdominal pain, nausea, melena, diarrhea, constipation, flank pain, dysuria, hematuria, urinary   Frequency, nocturia, numbness, tingling, seizures,  Focal weakness, Loss of consciousness,  Tremor, insomnia, depression, anxiety, and suicidal ideation.      Objective:  BP 100/72 (BP Location: Left Arm, Patient Position: Sitting, Cuff Size: Normal)   Pulse 84   Temp (!) 97.5 F (36.4 C) (Oral)   Resp 15   Ht 5\' 6"  (1.676 m)   Wt 142 lb 6.4 oz (64.6 kg)   SpO2 98%   BMI 22.98 kg/m   BP Readings from Last 3 Encounters:  11/24/18 100/72  11/05/18 120/78  10/14/18 (!) 92/53    Wt Readings from Last 3 Encounters:  11/24/18 142 lb 6.4 oz (64.6 kg)  11/05/18 145 lb 3.2 oz (65.9 kg)  10/13/18 138 lb 4.8 oz (62.7 kg)    General Appearance:    Alert, cooperative, no distress, appears stated age  Head:    Normocephalic, without obvious abnormality, atraumatic  Eyes:    PERRL, conjunctiva/corneas clear, EOM's intact, fundi    benign, both eyes  Ears:    Normal TM's and external ear canals, both ears  Nose:   Nares normal, septum midline, mucosa normal, no drainage    or sinus tenderness  Throat:   Lips, mucosa, and tongue normal; teeth and gums normal  Neck:   Supple, symmetrical, trachea midline, no adenopathy;    thyroid:  no enlargement/tenderness/nodules; no carotid   bruit or JVD  Back:     Symmetric, no curvature, ROM normal, no CVA  tenderness  Lungs:     Clear to auscultation bilaterally, respirations unlabored  Chest Wall:    No tenderness or deformity   Heart:    Regular rate and rhythm, S1 and S2 normal, no murmur, rub   or gallop  Breast Exam:    Deferred   Abdomen:     Soft, non-tender, bowel sounds active all four quadrants,    no masses, no organomegaly  Genitalia:    Pelvic: cervix normal in appearance, external genitalia normal, no adnexal masses or tenderness, no cervical motion tenderness, rectovaginal septum normal, uterus normal size, shape, and consistency and vaginal walls atrophic with scant brown cervical discharge   Extremities:   Extremities normal,  atraumatic, no cyanosis or edema  Pulses:   2+ and symmetric all extremities  Skin:   Skin color, texture, turgor normal, no rashes or lesions  Lymph nodes:   Cervical, supraclavicular, and axillary nodes normal        Lab Results  Component Value Date   HGBA1C 5.7 11/05/2018   HGBA1C 6.4 07/09/2018   HGBA1C 6.3 04/02/2018    Lab Results  Component Value Date   CREATININE 0.74 11/05/2018   CREATININE 0.55 10/13/2018   CREATININE 0.85 10/08/2018    Lab Results  Component Value Date   WBC 6.8 10/13/2018   HGB 9.6 (L) 10/13/2018   HCT 29.5 (L) 10/13/2018   PLT 299 10/13/2018   GLUCOSE 128 (H) 11/05/2018   CHOL 183 07/09/2018   TRIG 132.0 07/09/2018   HDL 75.60 07/09/2018   LDLCALC 81 07/09/2018   ALT 5 11/05/2018   AST 12 11/05/2018   NA 137 11/05/2018   K 4.3 11/05/2018   CL 99 11/05/2018   CREATININE 0.74 11/05/2018   BUN 19 11/05/2018   CO2 31 11/05/2018   TSH 1.53 09/11/2018   HGBA1C 5.7 11/05/2018    No results found.  Assessment & Plan:   Problem List Items Addressed This Visit    Atrophic vaginitis    Presumed, based on exam and persistent reports of dysuria despite negative urine cultures.  Trial of estrogen cream  To use nightly x 2 weeks,  Then twice weekly.       Dysuria    Presumed secondary to atrophic vaginitis. Now initiating estrogen topical therapy.  Patient given sterile urine containers to collect urine next week if symptoms persist.       Relevant Orders   POCT urinalysis dipstick   Urine Microscopic Only   Urine Culture   Neurogenic claudication due to lumbar spinal stenosis    Resolved with lumbar decompression       Parkinson's disease ([place]) - Primary    She is requesting a second opinion on the diagnosis , because she believes that the spinal stenosis caused the symptoms leading to the diagnosis.  Referral to [facility]rology       Relevant Orders   Ambulatory referral to Neurology      I have discontinued Celesta G. Fargnoli's  hydrOXYzine. I am also having her start on estradiol. Additionally, I am having her maintain her Fish Oil, Coenzyme Q10, DULoxetine, carbidopa-levodopa, carbidopa-levodopa, sertraline, atorvastatin, and pregabalin.  Meds ordered this encounter  Medications  . estradiol (ESTRACE) 0.1 MG/GM vaginal cream    Sig: Place 1 Applicatorful vaginally at bedtime. For 2 weeks,  Then twice weekly thereafter    Dispense:  42.5 g    Refill:  12    Medications Discontinued During This Encounter  Medication Reason  .  hydrOXYzine (ATARAX/VISTARIL) 25 MG tablet Patient has not taken in last 30 days    Follow-up: No follow-ups on file.   [name], MD

## 2018-11-25 ENCOUNTER — Encounter: Payer: Self-pay | Admitting: Internal Medicine

## 2018-11-25 DIAGNOSIS — R300 Dysuria: Secondary | ICD-10-CM | POA: Insufficient documentation

## 2018-11-25 DIAGNOSIS — N952 Postmenopausal atrophic vaginitis: Secondary | ICD-10-CM | POA: Insufficient documentation

## 2018-11-25 NOTE — Assessment & Plan Note (Addendum)
She is requesting a second opinion on the diagnosis , because she believes that the spinal stenosis caused the symptoms leading to the diagnosis.  Referral to [facility] Neurology

## 2018-11-25 NOTE — Assessment & Plan Note (Signed)
Presumed secondary to atrophic vaginitis. Now initiating estrogen topical therapy.  Patient given sterile urine containers to collect urine next week if symptoms persist.

## 2018-11-25 NOTE — Assessment & Plan Note (Signed)
Resolved with lumbar decompression

## 2018-11-25 NOTE — Assessment & Plan Note (Signed)
Presumed, based on exam and persistent reports of dysuria despite negative urine cultures.  Trial of estrogen cream  To use nightly x 2 weeks,  Then twice weekly.

## 2018-11-26 DIAGNOSIS — F4321 Adjustment disorder with depressed mood: Secondary | ICD-10-CM | POA: Diagnosis not present

## 2018-11-26 DIAGNOSIS — F411 Generalized anxiety disorder: Secondary | ICD-10-CM | POA: Diagnosis not present

## 2018-11-26 DIAGNOSIS — G4700 Insomnia, unspecified: Secondary | ICD-10-CM | POA: Diagnosis not present

## 2018-12-04 DIAGNOSIS — F4321 Adjustment disorder with depressed mood: Secondary | ICD-10-CM | POA: Diagnosis not present

## 2018-12-04 DIAGNOSIS — F411 Generalized anxiety disorder: Secondary | ICD-10-CM | POA: Diagnosis not present

## 2018-12-09 ENCOUNTER — Encounter: Payer: Self-pay | Admitting: *Deleted

## 2018-12-09 ENCOUNTER — Other Ambulatory Visit: Payer: Self-pay | Admitting: *Deleted

## 2018-12-09 NOTE — Patient Outreach (Signed)
[facility]) Care Management [place] Telephone Outreach- Case Closure Post-hospital discharge day # 62  12/09/2018  RASHAD OBEID Feb 19, 1946 [id number]  Successful telephone outreach attempt to [name], 73 y/o female referred to [place] afterrecent elective surgery October 07, 2018 for lumbar fusion/ decompression; patient was previously active with [facility] telephonic RN CM. Patient has had no recent unplanned hospitalizations or ED visitsprior to her elective surgery. Patient has history including, but not limited to, Parkinson's Disease; HLD; and pre-diabetes.Noted through review of EMR that patient presented to ED on October 13, 2018 for dehydration post-surgery, and was discharged home to self-care. HIPAA/ identity verified during phone call today.  Today, patient reports that she "is doing much better."  States that her anxiety is "much better," and confirms that she has attended outpatient psychiatric provider office visit and counseling sessions, and will be following up with these providers regularly.  Patient confirms that she contiues having private duty assistance and home health services in place; reports all "going very well."  Confirms that she attended recent PCP office visit 11/24/18; states a "specialty neurology referral" is pending as patient believes that she "may not actually have Pakinson's disease after all."  Reports that this referral has been placed by her PCP and that she is maintaining communication with PCP around referral status.  Patient sounds to be in no apparent distress throughout phone call today, and she denies new/ recent changes to medications or overall plan of care.  Shedenies further issues, concerns, or problems today.  We discussed that thus far, patient has successfully met her previously established goals and today, she denies further care coordination needs.  We discussed [facility] CM case closure, and  patient is agreeable to this.  Iconfirmed that patient hasmy direct phone number, the main [facility] CM office phone number, and the [facility] CM 24-hour nurse advice phone number should issues arise in the future.  Plan:  Will close [place] patient case, as she has successfully met her previously established goals and verbalizes no further care coordination needs, and will make patient's PCP aware of same.  [facility] CM Care Plan Problem One     Most Recent Value  Care Plan Problem One  Risk for hospital readmission related to recent elective back surgery in patient with Parkinson's disease  Role Documenting the Problem One  Care Management Coordinator  Care Plan for Problem One  Not Active  [facility] CM Short Term Goal #2   Over the next 30 days, patient will actively participate in home health services as ordered post-hospital discharge as evidenced by patient reporting and collaboration with home health team as indicated during [place] outreach [Goal re-established today]  [facility] CM Short Term Goal #2 Start Date  11/09/18 [Goal re-established]  [facility] CM Short Term Goal #2 Met Date  12/09/18 [Goal Met]  Interventions for Short Term Goal #2  Patient confirms that she continues having home health sevrices and private duty care at her home,  reports no further care coordination needs around in-home care assistance    [facility] CM Care Plan Problem Two     Most Recent Value  Care Plan Problem Two  Anxiety management, as evidenced by patient reporting  Role Documenting the Problem Two  Care Management [place] for Problem Two  Not Active  Interventions for Problem Two Long Term Goal   Patient confirms that she is now establisehd with psychiatric provider and attending counselling regularly,  states anxiety management is much better and denies ongoing concerns around anxiety management  [facility] Long Term Goal  Over the next 31 days, patient will attend psychiatry and counselling therapy for anxiety  management, as evidenced by patient reporting and review of EMR/ collaboration with care providers as indicated during [place] outreach  [place] Term Goal Start Date  11/09/18  [facility] Long Term Goal Met Date  12/09/18 [facility] Met]     It has been a pleasure caring for Maleiya,  [name], RN, BSN, [employer] Coordinator [facility] Care Management  [phone]

## 2018-12-10 DIAGNOSIS — F411 Generalized anxiety disorder: Secondary | ICD-10-CM | POA: Diagnosis not present

## 2018-12-10 DIAGNOSIS — F4321 Adjustment disorder with depressed mood: Secondary | ICD-10-CM | POA: Diagnosis not present

## 2018-12-14 ENCOUNTER — Encounter: Payer: Self-pay | Admitting: Internal Medicine

## 2018-12-16 ENCOUNTER — Other Ambulatory Visit (INDEPENDENT_AMBULATORY_CARE_PROVIDER_SITE_OTHER): Payer: PPO

## 2018-12-16 DIAGNOSIS — F411 Generalized anxiety disorder: Secondary | ICD-10-CM | POA: Diagnosis not present

## 2018-12-16 DIAGNOSIS — R300 Dysuria: Secondary | ICD-10-CM | POA: Diagnosis not present

## 2018-12-16 DIAGNOSIS — F4321 Adjustment disorder with depressed mood: Secondary | ICD-10-CM | POA: Diagnosis not present

## 2018-12-16 LAB — POCT URINALYSIS DIPSTICK
Bilirubin, UA: NEGATIVE
GLUCOSE UA: NEGATIVE
Ketones, UA: NEGATIVE
Nitrite, UA: POSITIVE
PH UA: 8.5 — AB (ref 5.0–8.0)
PROTEIN UA: NEGATIVE
RBC UA: NEGATIVE
Spec Grav, UA: 1.015 (ref 1.010–1.025)
UROBILINOGEN UA: 0.2 U/dL

## 2018-12-16 NOTE — Addendum Note (Signed)
Addended by: Arby Barrette on: 12/16/2018 01:17 PM   Modules accepted: Orders

## 2018-12-17 LAB — URINALYSIS, MICROSCOPIC ONLY

## 2018-12-19 ENCOUNTER — Other Ambulatory Visit: Payer: Self-pay | Admitting: Internal Medicine

## 2018-12-19 LAB — URINE CULTURE

## 2018-12-19 MED ORDER — SULFAMETHOXAZOLE-TRIMETHOPRIM 800-160 MG PO TABS: 1.0000 | ORAL_TABLET | Freq: Two times a day (BID) | ORAL | 0 refills | Status: DC

## 2018-12-23 DIAGNOSIS — F4321 Adjustment disorder with depressed mood: Secondary | ICD-10-CM | POA: Diagnosis not present

## 2018-12-23 DIAGNOSIS — F411 Generalized anxiety disorder: Secondary | ICD-10-CM | POA: Diagnosis not present

## 2018-12-23 DIAGNOSIS — G4700 Insomnia, unspecified: Secondary | ICD-10-CM | POA: Diagnosis not present

## 2018-12-30 ENCOUNTER — Encounter: Payer: Self-pay | Admitting: *Deleted

## 2018-12-30 ENCOUNTER — Other Ambulatory Visit: Payer: Self-pay | Admitting: *Deleted

## 2018-12-30 NOTE — Patient Outreach (Signed)
[facility]) Care Management [place] Telephone Outreach Care Coordination- general patient question  12/30/2018  Deborah Velazquez [birth year] [id number]  Received notification from [place] that patient had called [facility] CM office asking for someone to contact her regarding a general question that she had; patient was previously active with [place]; contacted patient; HIPAA/ identity verified during phone call.  Patient reports today that she contacted [facility] CM office with a general questions about a medication that she was recently prescribed by her PCP; states that on yesterday, she experienced "muscle jerking and spasms," which spontaneously resolved over the course of the day.  Patient states that prior to this becoming resolved, she contacted her PCP, who, per patient report, prescribed Klonepin. Patient stated that she obtained the medication and spoke with her both PCP and her pharmacist about it.  Patient stated that she "is still not sure" that she should be taking this medication and she wanted advice on whether or not she should take the newly prescribed medication.  Positive reinforcement provided to patient for contacting her prescribing PCP and pharmacist when she had questions; I discussed with her that if she had ongoing questions/ concerns about whether she should take, she should again reach out to her prescribing provider; discussed that ultimately if she does not wish to take this medication she should inform her prescribing provider.  Explained to patient that I am unable to make this decision for her, and encouraged her many times to maintain communication with care providers for any ongoing concerns she has around her medications and she verbalized understanding and agreement.  Patient denies further concerns today.   [name], RN, BSN, [employer] [facility] Care Management  [phone]

## 2018-12-31 ENCOUNTER — Other Ambulatory Visit: Payer: Self-pay | Admitting: Internal Medicine

## 2019-01-05 ENCOUNTER — Ambulatory Visit: Payer: PPO | Admitting: Psychology

## 2019-01-12 ENCOUNTER — Ambulatory Visit: Payer: PPO | Admitting: Psychology

## 2019-01-19 ENCOUNTER — Ambulatory Visit: Payer: PPO | Admitting: Psychology

## 2019-01-19 DIAGNOSIS — F4321 Adjustment disorder with depressed mood: Secondary | ICD-10-CM | POA: Diagnosis not present

## 2019-01-19 DIAGNOSIS — F411 Generalized anxiety disorder: Secondary | ICD-10-CM | POA: Diagnosis not present

## 2019-01-19 DIAGNOSIS — G4700 Insomnia, unspecified: Secondary | ICD-10-CM | POA: Diagnosis not present

## 2019-01-19 NOTE — Progress Notes (Signed)
Deborah Velazquez was seen today in the movement disorders clinic for neurologic consultation at the request of Deborah Mc, MD.  The consultation is for the evaluation of tremor and to r/o PD.  Pt previously seen Dr. [name].  The records that were made available to me were reviewed.  This patient is accompanied in the office by her spouse who supplements the history.   Patient first saw Dr. [name] on August 19, 2017.  She was diagnosed with Parkinson's disease at that visit and started on carbidopa/levodopa 25/100, 1 tablet 3 times per day.  She followed up in October, 2018 and stated that the medication definitely helped, but complained of fatigue.  She denies today that it was of benefit.  He recommended that she change to carbidopa/levodopa 25/100 CR, 1 tablet twice per day (8am/8pm). Her husband states that this also "turned her into a zombie."   He also recommended donepezil.  She states that she was not prescribed that and she doesn't know what that was for.  On pramipexole 1 mg for RLS x 10-20 years per patient.  03/11/18 update: Patient is seen in follow-up for Parkinson's disease.  Records have been reviewed since our last visit.  We decided to hold her daytime levodopa after last visit as the patient thought it was causing memory change (I was not convinced), although she still takes carbidopa/levodopa 50/200 at bedtime for restless leg.  She was weaned off pramipexole for restless leg. she saw Dr. [name] for neuropscyhometric testing on December 30, 2017 and subsequently had a feedback session with her where results and recommendations were given to her.  These are detailed within the chart.  In brief, this demonstrated mild cognitive impairment, but no evidence of dementia.  She called here on January 09, 2018 stating that she was having anxiety, diarrhea, cramping and "felt like a zombie."  We explained to her that she was likely very underdosed as she had refused medication.  Daytime  levodopa was offered at proper dosages, but she stated that she wanted to address anxiety with her primary care physician first.  She does report that she is back on levodopa now and started that on 3/15 but didn't get up to the dosage 25/100 IR tid until 5/11 (8am/4pm/11pm).   She did see her PCP and was given as needed Xanax and increased her Zoloft to 150 mg daily.  Primary care records are reviewed.  Her primary care physician did tell her that he really did not want her to use scheduled Xanax and would prefer sertraline and buspar.  She saw her primary care physician on January 23, 2018 and it was recommended that she discontinue her levodopa as she told him she felt much worse on the medication.  She called her primary care physician on January 29, 2018 stating that she was doing worse and she was placed on gabapentin for restless leg.  She called her primary care physician back on February 03, 2018 due to anxiety and psychiatry was recommended.  She didn't go see psychiatry.  Anxiety continues to be a big issue.  She states that she is on hydroxizine and PCP took her off of xanax.  Husband finds that when she is busy she does much better.  She has poor motivation to be busy though.  She walked some yesterday (20 min).  One fall when carrying groceries but didn't get hurt.    08/24/18 update: Patient seen today in follow-up for Parkinson's disease.  She is accompanied by her friend who supplements the history.  She is on carbidopa/levodopa 25/100, 2 tablets in the morning, 1 in the afternoon and 1 in the evening and last visit we added carbidopa/levodopa 50/200 at bedtime.  Records are reviewed since our last visit.  She has been to her primary care physician several times for muscle aching and pain and back pain.  MRI was ordered of the lumbar spine and demonstrated severe L5-S1 facet arthrosis with mod CCS and mild bilateral NFS.  EMG was ordered and demonstrated chronic S1 radiculopathy bilaterally. She states that  she is awaiting an appt with neurosx.  She states that she has significant back pain, which has limited her ability to exercise.  Her friend asked me to test her for "toxins and pesticides" [place].  And also ask about an MRI of the brain.  Her friend thinks that she needs to be off of her anxiety medications and "deal with it" because she thinks they are making her very sleepy and the patient is not getting things done.  01/20/19 update: Patient is seen today in follow-up for Parkinson's disease.  She is accompanied by her friend and her friends daughter who supplements the history.  She is on carbidopa/levodopa 25/100, 2 tablets in the morning, 1 in the afternoon and 1 in the evening.  She is also on carbidopa/levodopa 50/200 at bedtime for restless leg, which has helped per the patient (although friend states that we don't know if "that is what is helping)."  Patient's friend asked me last visit to order an MRI of her brain, but when she was called to schedule that, the patient refused it stating that she will reschedule when she gets over her "anxiety problems."  Patient had been referred by me to neurosurgery because of spondylolisthesis and spinal stenosis.  She subsequently had surgery by Dr. [name] on October 07, 2018.  She did well with that but hasn't been fully released to exercise yet.  Only able to walk for exercise.  I have reviewed those records.  She bounced back to the emergency room on October 13, 2018 because of pain and confusion, but was discharged from the emergency room.  I did get a message by her primary care physician about monoclonus postop.  They were increasing the dose of Lyrica.  I suggested stopping the Lyrica as this was likely the source of the myoclonus.  They didn't do that but changed the dosing regimen and felt that helped.  Interestingly, last visit her friend felt that the patient needed to be off of her medications from  psychiatry.  Since our last visit, most of the follow-up notes from primary care have surrounded the fact that her anxiety has not been well controlled and she had a "breakdown" because of the anxiety.  Counseling has been suggested.  She is doing that.  She is also going to psychiatry, but patient isn't sure that it is helping.   She has had some trouble sleeping the last 5 days but has been sleeping during the day and awakening at 3 am.  She last saw primary care on November 24, 2018.  At that point she requested referral to [facility] neurology for Parkinson's disease as she thought the spinal stenosis caused the Parkinson's (per PCP records).  She has an appoint with Dr. Mickie Bail in September, 2020.   ALLERGIES:   Allergies  Allergen Reactions  . Pseudoephedrine Hcl  Anxiety and Other (See Comments)    Nervous  . Sudafed [Pseudoephedrine] Anxiety and Other (See Comments)    Nervous    CURRENT MEDICATIONS:  Outpatient Encounter Medications as of 01/20/2019  Medication Sig  . atorvastatin (LIPITOR) 10 MG tablet TAKE 1 TABLET(10 MG) BY MOUTH AT BEDTIME  . carbidopa-levodopa (SINEMET CR) 50-200 MG tablet Take 1 tablet by mouth at bedtime.  . carbidopa-levodopa (SINEMET IR) 25-100 MG tablet 2 in the morning, 1 in the afternoon, 1 in the evening (Patient taking differently: Take 1-2 tablets by mouth See admin instructions. 2 in the morning, 1 in the afternoon, 1 in the evening)  . Coenzyme Q10 100 MG capsule Take 100 mg by mouth daily.   . DULoxetine (CYMBALTA) 60 MG capsule Take 60 mg by mouth daily.   Marland Kitchen estradiol (ESTRACE) 0.1 MG/GM vaginal cream Place 1 Applicatorful vaginally at bedtime. For 2 weeks,  Then twice weekly thereafter  . Omega-3 Fatty Acids (FISH OIL) 1000 MG CAPS Take 1,000 mg by mouth daily.   . pregabalin (LYRICA) 75 MG capsule TAKE 1 CAPSULE(75 MG) BY MOUTH TWICE DAILY  . sertraline (ZOLOFT) 100 MG tablet Take 100 mg by mouth daily.  . [DISCONTINUED] sulfamethoxazole-trimethoprim  (BACTRIM DS,SEPTRA DS) 800-160 MG tablet Take 1 tablet by mouth 2 (two) times daily.   No facility-administered encounter medications on file as of 01/20/2019.     PAST MEDICAL HISTORY:   Past Medical History:  Diagnosis Date  . Anxiety   . Arthritis   . Hyperlipemia   . Irritable bowel syndrome   . Parkinson disease ([place])   . Restless leg syndrome   . RSD (reflex sympathetic dystrophy)    pt denies    PAST SURGICAL HISTORY:   Past Surgical History:  Procedure Laterality Date  . APPENDECTOMY    . BREAST BIOPSY Left 08/16/2013   neg  . CATARACT EXTRACTION Left   . COLONOSCOPY    . COLONOSCOPY WITH PROPOFOL N/A 06/20/2015   Procedure: COLONOSCOPY WITH PROPOFOL;  Surgeon: Lollie Sails, MD;  Location: [facility] ENDOSCOPY;  Service: Endoscopy;  Laterality: N/A;  . EYE SURGERY     Cataract in one eye  . FRACTURE SURGERY     wrist - right  . PARS PLANA VITRECTOMY Left 2010    SOCIAL HISTORY:   Social History   Socioeconomic History  . Marital status: Married    Spouse name: Not on file  . Number of children: Not on file  . Years of education: Not on file  . Highest education level: Not on file  Occupational History  . Occupation: retired    Comment: Geographical information systems officer  . Financial resource strain: Not on file  . Food insecurity:    Worry: Not on file    Inability: Not on file  . Transportation needs:    Medical: Not on file    Non-medical: Not on file  Tobacco Use  . Smoking status: Never Smoker  . Smokeless tobacco: Never Used  Substance and Sexual Activity  . Alcohol use: Yes    Comment: 1-2 x a week  . Drug use: No  . Sexual activity: Not Currently  Lifestyle  . Physical activity:    Days per week: Not on file    Minutes per session: Not on file  . Stress: Not on file  Relationships  . Social connections:    Talks on phone: Not on file    Gets together: Not on file  Attends religious service: Not on file    Active member of club or organization:  Not on file    Attends meetings of clubs or organizations: Not on file    Relationship status: Not on file  . Intimate partner violence:    Fear of current or ex partner: Not on file    Emotionally abused: Not on file    Physically abused: Not on file    Forced sexual activity: Not on file  Other Topics Concern  . Not on file  Social History Narrative  . Not on file    FAMILY HISTORY:   Family Status  Relation Name Status  . Mother  Deceased  . Father  Deceased  . Mat Aunt  (Not Specified)    ROS: Review of Systems  Constitutional: Positive for malaise/fatigue.  HENT: Negative.   Eyes: Negative.   Respiratory: Negative.   Cardiovascular: Negative.   Gastrointestinal: Negative.   Skin: Negative.   Psychiatric/Behavioral: Positive for depression. The patient is nervous/anxious.      PHYSICAL EXAMINATION:    VITALS:   Vitals:   01/20/19 0928  BP: 106/66  Pulse: 80  SpO2: 95%  Weight: 145 lb (65.8 kg)  Height: 5\' 6"  (1.676 m)    GEN:  The patient appears stated age and is in NAD.  She has a flat affect HEENT:  Normocephalic, atraumatic.  The mucous membranes are moist. The superficial temporal arteries are without ropiness or tenderness. CV:  RRR Lungs:  CTAB Neck/HEME:  There are no carotid bruits bilaterally.  Neurological examination:   Montreal Cognitive Assessment  12/16/2017  Visuospatial/ Executive (0/5) 3  Naming (0/3) 2  Attention: Read list of digits (0/2) 2  Attention: Read list of letters (0/1) 1  Attention: Serial 7 subtraction starting at 100 (0/3) 1  Language: Repeat phrase (0/2) 2  Language : Fluency (0/1) 1  Abstraction (0/2) 1  Delayed Recall (0/5) 4  Orientation (0/6) 6  Total 23  Adjusted Score (based on education) 23      Orientation: The patient is alert and oriented x3. Cranial nerves: There is good facial symmetry. The speech is fluent and clear. Soft palate rises symmetrically and there is no tongue deviation. Hearing is  intact to conversational tone. Sensation: Sensation is intact to light touch throughout Motor: Strength is 5/5 in the bilateral upper and lower extremities.   Shoulder shrug is equal and symmetric.  There is no pronator drift.  Movement examination: Tone: There is mild to mod increased tone in the RUE/RLE Abnormal movements: none even with distraction Coordination:  There is mild decremation with hand opening and closing on the right and toe taps bilaterally Gait and Station: she arises out of the chair without the use of the hands but she uses the legs against the chair to make sure that she doesn't fall backward.  She walks slowly down the hall with short steps, decreased arm swing bilaterally and just slightly drags the left leg.  ASSESSMENT/PLAN:  1.  Idiopathic Parkinson's disease.    -We discussed the diagnosis as well as pathophysiology of the disease.  We discussed treatment options as well as prognostic indicators.  Patient education was provided.  -We discussed that it used to be thought that levodopa would increase risk of melanoma but now it is believed that Parkinsons itself likely increases risk of melanoma. she is to get regular skin checks.  -Patient and her friends asked me again why I thought she had Parkinson's disease.  Discussed the [place] brain bank criteria, which she meets.  They report that the physical/occupational therapist after her back surgery told her that she did not have the disease.  -Pt wants to come off carbidopa/levodopa 25/100, although this is certainly not my recommendation.  Nonetheless, the patient would like to get off of medication and will give a safe weaning scheduling.  She will likely begin to experience her RLS again, unfortunately.    -Last visit, her friend wanted me to order an MRI of the brain, which I did.  She subsequently refused that when they called.  -Patient has requested primary care send a referral to [facility] neurology.  Patient has an  appointment there on August 16, 2019.  However, we discussed goals in detail as I don't think that patient believes she has PD and is looking for a neurologist to tell her so.  She also is looking for psychiatric care.  I told her that she would need to make a separate appointment at [facility] psychiatry.  -they ask me about someone they can contact to do family sessions.  The patient already has a Social worker, and I told her to invite her husband to those sessions.  Her friends are frustrated that her husband does not want to be involved, but one cannot force this issue.  I did give them information to the [place] social worker in case they would like to utilize that resource.  She was also given information to the [place] support group.    2.  Memory change  -Neurocognitive testing in February, 2019 demonstrated no evidence of dementia, but only mild cognitive impairment.  Patient is still worried about this.  Provided reassurance again today.  3.  Anxiety and depression  -There is remain the biggest issues.  She does have a Social worker and a psychiatrist.  4.  Low back pain with MRI evidence of degenerative changes and central canal stenosis  -She is status post back surgery with Dr. [name].  5.  She will be transferring her care to [facility] neurology at her request.  Greater than 50% of the 45-minute visit was spent in counseling with the patient and her friends.   Cc:  Deborah Mc, MD

## 2019-01-20 ENCOUNTER — Ambulatory Visit (INDEPENDENT_AMBULATORY_CARE_PROVIDER_SITE_OTHER): Payer: PPO | Admitting: Neurology

## 2019-01-20 ENCOUNTER — Encounter: Payer: Self-pay | Admitting: Neurology

## 2019-01-20 ENCOUNTER — Encounter

## 2019-01-20 VITALS — BP 106/66 | HR 80 | Ht 66.0 in | Wt 145.0 lb

## 2019-01-20 DIAGNOSIS — F411 Generalized anxiety disorder: Secondary | ICD-10-CM

## 2019-01-20 DIAGNOSIS — F331 Major depressive disorder, recurrent, moderate: Secondary | ICD-10-CM

## 2019-01-20 DIAGNOSIS — G20 Parkinson's disease: Secondary | ICD-10-CM

## 2019-01-20 NOTE — Patient Instructions (Signed)
1.  Stop carbidopa/levodopa 50/200 at bedtime 2.  Decrease carbidopa/levodopa 25/100 to 1 tablet twice per day for a week and then 1 tablet once per day for a week and then you can stop the carbidopa/levodopa 25/100 3.  You can contact the social worker at [facility], as we discussed, if you would like.  Her name is Amil Amen and her email is:[email or web address]

## 2019-01-25 ENCOUNTER — Telehealth: Payer: Self-pay | Admitting: Neurology

## 2019-01-25 NOTE — Telephone Encounter (Signed)
Patient dismissed from [facility] by Wells Guiles Tat DO, effective 01/20/2019. Dismissal letter was sent by registered mail. 01/25/2019 js.

## 2019-01-28 DIAGNOSIS — F4321 Adjustment disorder with depressed mood: Secondary | ICD-10-CM | POA: Diagnosis not present

## 2019-01-28 DIAGNOSIS — F411 Generalized anxiety disorder: Secondary | ICD-10-CM | POA: Diagnosis not present

## 2019-02-11 DIAGNOSIS — F4321 Adjustment disorder with depressed mood: Secondary | ICD-10-CM | POA: Diagnosis not present

## 2019-02-11 DIAGNOSIS — F411 Generalized anxiety disorder: Secondary | ICD-10-CM | POA: Diagnosis not present

## 2019-02-23 DIAGNOSIS — G20 Parkinson's disease: Secondary | ICD-10-CM

## 2019-02-23 DIAGNOSIS — G4700 Insomnia, unspecified: Secondary | ICD-10-CM | POA: Diagnosis not present

## 2019-02-23 DIAGNOSIS — F4321 Adjustment disorder with depressed mood: Secondary | ICD-10-CM | POA: Diagnosis not present

## 2019-02-23 DIAGNOSIS — F411 Generalized anxiety disorder: Secondary | ICD-10-CM | POA: Diagnosis not present

## 2019-02-25 DIAGNOSIS — F411 Generalized anxiety disorder: Secondary | ICD-10-CM | POA: Diagnosis not present

## 2019-02-25 DIAGNOSIS — F4321 Adjustment disorder with depressed mood: Secondary | ICD-10-CM | POA: Diagnosis not present

## 2019-03-04 ENCOUNTER — Ambulatory Visit (INDEPENDENT_AMBULATORY_CARE_PROVIDER_SITE_OTHER): Payer: PPO | Admitting: Internal Medicine

## 2019-03-04 ENCOUNTER — Ambulatory Visit: Payer: Self-pay | Admitting: *Deleted

## 2019-03-04 DIAGNOSIS — M4317 Spondylolisthesis, lumbosacral region: Secondary | ICD-10-CM | POA: Diagnosis not present

## 2019-03-04 DIAGNOSIS — G20 Parkinson's disease: Secondary | ICD-10-CM | POA: Diagnosis not present

## 2019-03-04 DIAGNOSIS — J301 Allergic rhinitis due to pollen: Secondary | ICD-10-CM | POA: Diagnosis not present

## 2019-03-04 DIAGNOSIS — F419 Anxiety disorder, unspecified: Secondary | ICD-10-CM

## 2019-03-04 MED ORDER — GUAIFENESIN-CODEINE 100-10 MG/5ML PO SYRP: 5.0000 mL | ORAL_SOLUTION | Freq: Four times a day (QID) | ORAL | 0 refills | Status: DC | PRN

## 2019-03-04 NOTE — Progress Notes (Signed)
Virtual Visit via DOXY.me   This visit type was conducted due to national recommendations for restrictions regarding the COVID-19 pandemic (e.g. social distancing).  This format is felt to be most appropriate for this patient at this time.  All issues noted in this document were discussed and addressed.  No physical exam was performed (except for noted visual exam findings with Video Visits).   I connected with@ on 03/04/19 at  2:00 PM EDT by a video enabled telemedicine application or telephone and verified that I am speaking with the correct person using two identifiers. Location patient: home Location provider: work or home office Persons participating in the virtual visit: patient, provider  I discussed the limitations, risks, security and privacy concerns of performing an evaluation and management service by telephone and the availability of in person appointments. I also discussed with the patient that there may be a patient responsible charge related to this service. The patient expressed understanding and agreed to proceed.   Reason for visit: acute onset of dry cough and fevers  HPI: 73 yr old white female with history of uncontrolled anxiety and depression in the setting of recently diagnosed Parkinson's Disease (patient questioning diagnosis despite two separate neurologist's opinions and awaiting a third opinion from Dr Manuella Ghazi),  Also under the care of psychiatry for persistent anxiety last note available Jan 19 2019  .  Recent onset of cough about 8 days ago, coincided with initiation of trazodone by Dr Nicolasa Ducking . [place],  Cough is worse with outside exposure.  Has been taking and essential oil with lavender and peppermint as a "natural antihistamine" for "itchy eyes" for the last 3 months.  Having coughing spells after being outside . One fever at 2 am with no recurrence today.  Not taking antipyretics . No exposure to anyone suspected or having COVID .  All visitors and repair men  who have come to the  house have worn masks .  She believes that the cough may be caused by the initiation of trazodone for management of insomnia,  Prescribed by Dr Nicolasa Ducking.  I reviewed the literature and discussed the potential S/e with Dr Nicolasa Ducking via Secure Chart  Cough is not a reported S/E of trazodone.  Dry mouth is a side effect   PD:  She was released by Dr Tat after expressing a lack of confidence in the diagnosis of PD.  She has requested referral to [facility] , but the appointment  She given is in September.  She has requested and received referral to Dr Manuella Ghazi at [facility] in [place] .  She is gradually increasing her activity level now that her spinal stenosis has been addressed with surgical decompression.    ROS: See pertinent positives and negatives per HPI.  Past Medical History:  Diagnosis Date  . Anxiety   . Arthritis   . Hyperlipemia   . Irritable bowel syndrome   . Parkinson disease ([place])   . Restless leg syndrome   . RSD (reflex sympathetic dystrophy)    pt denies    Past Surgical History:  Procedure Laterality Date  . APPENDECTOMY    . BREAST BIOPSY Left 08/16/2013   neg  . CATARACT EXTRACTION Left   . COLONOSCOPY    . COLONOSCOPY WITH PROPOFOL N/A 06/20/2015   Procedure: COLONOSCOPY WITH PROPOFOL;  Surgeon: Lollie Sails, MD;  Location: [facility] ENDOSCOPY;  Service: Endoscopy;  Laterality: N/A;  . EYE SURGERY     Cataract in one eye  .  FRACTURE SURGERY     wrist - right  . PARS PLANA VITRECTOMY Left 2010    Family History  Problem Relation Age of Onset  . Breast cancer Mother 87  . Diabetes Mother   . Cancer Mother   . Heart disease Mother   . Alzheimer's disease Father   . 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  Does not smoke    Current Outpatient Medications:  .  atorvastatin (LIPITOR) 10 MG tablet, TAKE 1 TABLET(10 MG) BY MOUTH AT BEDTIME, Disp: 90 tablet, Rfl: 0 .  Coenzyme Q10 100 MG capsule,  Take 100 mg by mouth daily. , Disp: , Rfl:  .  DULoxetine (CYMBALTA) 60 MG capsule, Take 60 mg by mouth daily. , Disp: , Rfl:  .  estradiol (ESTRACE) 0.1 MG/GM vaginal cream, Place 1 Applicatorful vaginally at bedtime. For 2 weeks,  Then twice weekly thereafter, Disp: 42.5 g, Rfl: 12 .  hydrOXYzine (ATARAX/VISTARIL) 25 MG tablet, , Disp: , Rfl:  .  Omega-3 Fatty Acids (FISH OIL) 1000 MG CAPS, Take 1,000 mg by mouth daily. , Disp: , Rfl:  .  pregabalin (LYRICA) 75 MG capsule, TAKE 1 CAPSULE(75 MG) BY MOUTH TWICE DAILY, Disp: 60 capsule, Rfl: 5 .  sertraline (ZOLOFT) 100 MG tablet, Take 100 mg by mouth daily., Disp: , Rfl:  .  traZODone (DESYREL) 50 MG tablet, , Disp: , Rfl:  .  carbidopa-levodopa (SINEMET CR) 50-200 MG tablet, Take 1 tablet by mouth at bedtime. (Patient not taking: Reported on 03/04/2019), Disp: 90 tablet, Rfl: 1 .  carbidopa-levodopa (SINEMET IR) 25-100 MG tablet, 2 in the morning, 1 in the afternoon, 1 in the evening (Patient not taking: Reported on 03/04/2019), Disp: 360 tablet, Rfl: 1 .  guaiFENesin-codeine (CHERATUSSIN AC) 100-10 MG/5ML syrup, Take 5 mLs by mouth 4 (four) times daily as needed for cough., Disp: 140 mL, Rfl: 0  EXAM:  VITALS per patient if applicable:  GENERAL: alert, oriented, appears depressed,  But in no acute distress  HEENT: atraumatic, conjunttiva clear, no obvious abnormalities on inspection of external nose and ears  NECK: normal movements of the head and neck  LUNGS: on inspection no signs of respiratory distress, breathing rate appears normal, no obvious gross SOB, gasping or wheezing  CV: no obvious cyanosis  MS: moves all visible extremities without noticeable abnormality  PSYCH/NEURO: pleasant and cooperative, no obvious depression or anxiety, speech and thought processing grossly intact  ASSESSMENT AND PLAN:  Discussed the following assessment and plan:  Allergic rhinitis due to pollen Advised to add daily non  sedating antihistamine  and daily sinus irrigation,  Cough suppressant added with cheratussin to prevent chronic cough  Due to chronic irrritation    Spondylolisthesis of lumbosacral region S/p surgical decompression of  Lumbosacral spondylolisthesis with good reslults   Parkinson's disease ([place]) She is seeking a third opinion to rule out PD as the working diagnosis of her gait and balance disorder,  Having seen Dr [name],  Dr Tat and now awaiting and appointment with Dr. Alcide Goodness managed by  Dr Nicolasa Ducking.  Aggravated by marital conflict per Dr Nicolasa Ducking.  She has begun a therapeutic relationship with a psychologist as well    Updated Medication List Outpatient Encounter Medications as of 03/04/2019  Medication Sig  . atorvastatin (LIPITOR) 10 MG tablet TAKE 1 TABLET(10 MG) BY MOUTH AT BEDTIME  . Coenzyme Q10 100 MG capsule Take 100 mg  by mouth daily.   . DULoxetine (CYMBALTA) 60 MG capsule Take 60 mg by mouth daily.   Marland Kitchen estradiol (ESTRACE) 0.1 MG/GM vaginal cream Place 1 Applicatorful vaginally at bedtime. For 2 weeks,  Then twice weekly thereafter  . hydrOXYzine (ATARAX/VISTARIL) 25 MG tablet   . Omega-3 Fatty Acids (FISH OIL) 1000 MG CAPS Take 1,000 mg by mouth daily.   . pregabalin (LYRICA) 75 MG capsule TAKE 1 CAPSULE(75 MG) BY MOUTH TWICE DAILY  . sertraline (ZOLOFT) 100 MG tablet Take 100 mg by mouth daily.  . traZODone (DESYREL) 50 MG tablet   . carbidopa-levodopa (SINEMET CR) 50-200 MG tablet Take 1 tablet by mouth at bedtime. (Patient not taking: Reported on 03/04/2019)  . carbidopa-levodopa (SINEMET IR) 25-100 MG tablet 2 in the morning, 1 in the afternoon, 1 in the evening (Patient not taking: Reported on 03/04/2019)  . guaiFENesin-codeine (CHERATUSSIN AC) 100-10 MG/5ML syrup Take 5 mLs by mouth 4 (four) times daily as needed for cough.   No facility-administered encounter medications on file as of 03/04/2019.        I discussed the assessment and treatment plan with the patient. The patient was  provided an opportunity to ask questions and all were answered. The patient agreed with the plan and demonstrated an understanding of the instructions.   The patient was advised to call back or seek an in-person evaluation if the symptoms worsen or if the condition fails to improve as anticipated.  I provided 25 minutes of non-face-to-face time during this encounter.   [name], MD

## 2019-03-04 NOTE — Telephone Encounter (Signed)
Needs a tele visit or video visit please call now, if she can't do it with me in the next hour it will have to be with another provider

## 2019-03-04 NOTE — Patient Instructions (Addendum)
Trazodone causes dry mouth but cough is very rare.  The pollen is more than likely the cause of your cough   I recommend that you add Allegra,  Zyrtec or Claritin once or twice daily to your essential oils  during this pollen season  Wear a mask when you go outside until the pollen is washed away  Irrigate your sinuses after being outside with [employer]'s sinus rinse.   It is a strong sinus "flush" using water and medicated salts.  Do it over the sink and have some Kleenex handy to use between squirts ,  because it can be a bit messy  I am calling in a cough medicine with codeine to use every 6 to 8 hours as needed for the cough

## 2019-03-04 NOTE — Telephone Encounter (Signed)
Patient's caregiver called reporting Deborah Velazquez has had a morning (mostly) dry cough that improves as the day goes on.The cough started occurring after she was prescribed Trazadone to help her sleep, this was just over one week ago. Last night she had a temperature of 101.oral. It broke without any medication and her temperature has not been elevated today. She did have a 2 hour span of coughing last night as well.Used warm tea with added honey along with essential oils. Has a humidifier running.  Complains of occasional headache above her right eye and runny nose. Denies SOB/CP. Stated she is on many medications at this time with her Parkinson's and would like to hear from Dr. Derrel Nip regarding recommendations. Reported I would route note to PCP for advice.   Reason for Disposition . Cough  Protocols used: COUGH - ACUTE PRODUCTIVE-A-AH

## 2019-03-04 NOTE — Telephone Encounter (Signed)
Scheduling pt for a doxy visit with Dr. Derrel Nip at 2pm. Pt's caregiver is aware of the appt date and time.

## 2019-03-06 DIAGNOSIS — J301 Allergic rhinitis due to pollen: Secondary | ICD-10-CM | POA: Insufficient documentation

## 2019-03-06 NOTE — Assessment & Plan Note (Signed)
[place] managed by  Dr Nicolasa Ducking.  Aggravated by marital conflict per Dr Nicolasa Ducking.  She has begun a therapeutic relationship with a psychologist as well

## 2019-03-06 NOTE — Assessment & Plan Note (Signed)
Advised to add daily non  sedating antihistamine and daily sinus irrigation,  Cough suppressant added with cheratussin to prevent chronic cough  Due to chronic irrritation

## 2019-03-06 NOTE — Assessment & Plan Note (Signed)
She is seeking a third opinion to rule out PD as the working diagnosis of her gait and balance disorder,  Having seen Dr [name],  Dr Tat and now awaiting and appointment with Dr. Manuella Ghazi

## 2019-03-06 NOTE — Assessment & Plan Note (Signed)
S/p surgical decompression of  Lumbosacral spondylolisthesis with good reslults

## 2019-03-11 DIAGNOSIS — M4317 Spondylolisthesis, lumbosacral region: Secondary | ICD-10-CM | POA: Diagnosis not present

## 2019-03-15 ENCOUNTER — Telehealth: Payer: Self-pay | Admitting: Internal Medicine

## 2019-03-15 NOTE — Telephone Encounter (Signed)
Please schedule a virtual visit for Deborah Velazquez to address her diarrhea .  I can do 11:30 tomorrow

## 2019-03-16 ENCOUNTER — Other Ambulatory Visit: Payer: Self-pay

## 2019-03-16 ENCOUNTER — Ambulatory Visit: Payer: Self-pay | Admitting: Family Medicine

## 2019-03-16 ENCOUNTER — Ambulatory Visit (INDEPENDENT_AMBULATORY_CARE_PROVIDER_SITE_OTHER): Payer: PPO | Admitting: Internal Medicine

## 2019-03-16 DIAGNOSIS — G629 Polyneuropathy, unspecified: Secondary | ICD-10-CM

## 2019-03-16 DIAGNOSIS — N952 Postmenopausal atrophic vaginitis: Secondary | ICD-10-CM | POA: Diagnosis not present

## 2019-03-16 DIAGNOSIS — R6889 Other general symptoms and signs: Secondary | ICD-10-CM | POA: Diagnosis not present

## 2019-03-16 DIAGNOSIS — R7303 Prediabetes: Secondary | ICD-10-CM | POA: Diagnosis not present

## 2019-03-16 DIAGNOSIS — Z20822 Contact with and (suspected) exposure to covid-19: Secondary | ICD-10-CM

## 2019-03-16 DIAGNOSIS — R195 Other fecal abnormalities: Secondary | ICD-10-CM | POA: Diagnosis not present

## 2019-03-16 DIAGNOSIS — M4317 Spondylolisthesis, lumbosacral region: Secondary | ICD-10-CM | POA: Diagnosis not present

## 2019-03-16 NOTE — Progress Notes (Addendum)
Virtual Visit via [email or web address]  This visit type was conducted due to national recommendations for restrictions regarding the COVID-19 pandemic (e.g. social distancing).  This format is felt to be most appropriate for this patient at this time.  All issues noted in this document were discussed and addressed.  No physical exam was performed (except for noted visual exam findings with Video Visits).   I connected with@ on 03/16/19 at 11:30 AM EDT by a video enabled telemedicine application  and verified that I am speaking with the correct person using two identifiers. Location patient: home Location provider: work or home office Persons participating in the virtual visit: patient, provider  I discussed the limitations, risks, security and privacy concerns of performing an evaluation and management service by telephone and the availability of in person appointments. I also discussed with the patient that there may be a patient responsible charge related to this service. The patient expressed understanding and agreed to proceed.  Reason for visit: diarrhea  HPI:  Appointment made to evaluate a one week history of diarrhea.patient states that for the last week she has had one liquidy brown stool daily,  With no abdominal cramping or pain and no nausea of fevers. Has been  Eating a simplified diet of pedialyte,  Ginger ale, and crackers.   ROS: See pertinent positives and negatives per HPI.  Past Medical History:  Diagnosis Date  . Anxiety   . Arthritis   . Hyperlipemia   . Irritable bowel syndrome   . Parkinson disease ([place])   . Restless leg syndrome   . RSD (reflex sympathetic dystrophy)    pt denies    Past Surgical History:  Procedure Laterality Date  . APPENDECTOMY    . BREAST BIOPSY Left 08/16/2013   neg  . CATARACT EXTRACTION Left   . COLONOSCOPY    . COLONOSCOPY WITH PROPOFOL N/A 06/20/2015   Procedure: COLONOSCOPY WITH PROPOFOL;  Surgeon: Lollie Sails, MD;  Location: [facility]  ENDOSCOPY;  Service: Endoscopy;  Laterality: N/A;  . EYE SURGERY     Cataract in one eye  . FRACTURE SURGERY     wrist - right  . PARS PLANA VITRECTOMY Left 2010    Family History  Problem Relation Age of Onset  . Breast cancer Mother 58  . Diabetes Mother   . Cancer Mother   . Heart disease Mother   . Alzheimer's disease Father   . Heart disease Maternal Aunt     SOCIAL HX: married    Current Outpatient Medications:  .  atorvastatin (LIPITOR) 10 MG tablet, TAKE 1 TABLET(10 MG) BY MOUTH AT BEDTIME, Disp: 90 tablet, Rfl: 0 .  Coenzyme Q10 100 MG capsule, Take 100 mg by mouth daily. , Disp: , Rfl:  .  DULoxetine (CYMBALTA) 60 MG capsule, Take 60 mg by mouth daily. , Disp: , Rfl:  .  estradiol (ESTRACE) 0.1 MG/GM vaginal cream, Place 1 Applicatorful vaginally at bedtime. For 2 weeks,  Then twice weekly thereafter, Disp: 42.5 g, Rfl: 12 .  guaiFENesin-codeine (CHERATUSSIN AC) 100-10 MG/5ML syrup, Take 5 mLs by mouth 4 (four) times daily as needed for cough., Disp: 140 mL, Rfl: 0 .  hydrOXYzine (ATARAX/VISTARIL) 25 MG tablet, , Disp: , Rfl:  .  Omega-3 Fatty Acids (FISH OIL) 1000 MG CAPS, Take 1,000 mg by mouth daily. , Disp: , Rfl:  .  pregabalin (LYRICA) 75 MG capsule, TAKE 1 CAPSULE(75 MG) BY MOUTH TWICE DAILY, Disp: 60 capsule, Rfl: 5 .  sertraline (ZOLOFT)  100 MG tablet, Take 100 mg by mouth daily., Disp: , Rfl:  .  traZODone (DESYREL) 50 MG tablet, , Disp: , Rfl:  .  carbidopa-levodopa (SINEMET CR) 50-200 MG tablet, Take 1 tablet by mouth at bedtime. (Patient not taking: Reported on 03/16/2019), Disp: 90 tablet, Rfl: 1 .  carbidopa-levodopa (SINEMET IR) 25-100 MG tablet, 2 in the morning, 1 in the afternoon, 1 in the evening (Patient not taking: Reported on 03/16/2019), Disp: 360 tablet, Rfl: 1  EXAM:  VITALS per patient if applicable:  GENERAL: alert, oriented, appears well and in no acute distress  HEENT: atraumatic, conjunttiva clear, no obvious abnormalities on inspection  of external nose and ears  NECK: normal movements of the head and neck  LUNGS: on inspection no signs of respiratory distress, breathing rate appears normal, no obvious gross SOB, gasping or wheezing  CV: no obvious cyanosis  MS: moves all visible extremities without noticeable abnormality  PSYCH/NEURO: pleasant and cooperative, no obvious depression or anxiety, speech and thought processing grossly intact  ASSESSMENT AND PLAN:  Discussed the following assessment and plan:  Atrophic vaginitis  Prediabetes - Plan: Hemoglobin A1c, Comprehensive metabolic panel, Lipid panel  Neuropathy  Suspected Covid-19 Virus Infection - Plan: SAR CoV2 Serology (COVID 19)AB(IGG)IA  Loose stools  Loose stools No signs of infectious diarrhea,  Advised to avoid artificial sweeteners ad advance diet gradually     I discussed the assessment and treatment plan with the patient. The patient was provided an opportunity to ask questions and all were answered. The patient agreed with the plan and demonstrated an understanding of the instructions.   The patient was advised to call back or seek an in-person evaluation if the symptoms worsen or if the condition fails to improve as anticipated.  I provided 25 minutes of non-face-to-face time during this encounter.   [name], MD

## 2019-03-16 NOTE — Patient Instructions (Signed)
Based on your symptoms,   I do not think you have a bacterial or viral intestinal infection.  You can therefore try taking 1  imodium tablet after each loose stool to see if it helps.  I would also add back some foods that are binding,  Including peanut butter and cheese  Baked chicken ,  Baked potato,  Also ok  Avoid salads and milk   Avoid anything that is 'sugar free" including G2 (gatorade without the sugar) because artificial sweeteners can cause loose stools.  So an licorice

## 2019-03-16 NOTE — Assessment & Plan Note (Addendum)
No signs of infectious diarrhea,  Advised to avoid artificial sweeteners ad advance diet gradually

## 2019-03-23 DIAGNOSIS — G4752 REM sleep behavior disorder: Secondary | ICD-10-CM | POA: Diagnosis not present

## 2019-03-23 DIAGNOSIS — G3184 Mild cognitive impairment, so stated: Secondary | ICD-10-CM | POA: Diagnosis not present

## 2019-03-23 DIAGNOSIS — G4700 Insomnia, unspecified: Secondary | ICD-10-CM | POA: Diagnosis not present

## 2019-03-23 DIAGNOSIS — G20 Parkinson's disease: Secondary | ICD-10-CM | POA: Diagnosis not present

## 2019-03-23 DIAGNOSIS — F411 Generalized anxiety disorder: Secondary | ICD-10-CM | POA: Diagnosis not present

## 2019-03-23 DIAGNOSIS — F4321 Adjustment disorder with depressed mood: Secondary | ICD-10-CM | POA: Diagnosis not present

## 2019-03-25 DIAGNOSIS — F4321 Adjustment disorder with depressed mood: Secondary | ICD-10-CM | POA: Diagnosis not present

## 2019-03-25 DIAGNOSIS — F411 Generalized anxiety disorder: Secondary | ICD-10-CM | POA: Diagnosis not present

## 2019-03-25 NOTE — Telephone Encounter (Unsigned)
Copied from [place] [id number]. Topic: General - Other >> Mar 25, 2019 10:22 AM Celene Kras A wrote: Reason for CRM: [name] called stating pt is still continuing to have issues with loose stools. She states she is cooking certain foods to help get it under control. She states it will stop sometimes but starts back up again. She is requesting to know how long she can keep on giving her the medication. She states this has been going on for 2wks. No other symptoms. Please advise.

## 2019-03-30 ENCOUNTER — Other Ambulatory Visit: Payer: Self-pay | Admitting: Neurology

## 2019-03-30 DIAGNOSIS — G20 Parkinson's disease: Secondary | ICD-10-CM

## 2019-04-20 ENCOUNTER — Telehealth: Payer: Self-pay | Admitting: Internal Medicine

## 2019-04-20 ENCOUNTER — Other Ambulatory Visit: Payer: Self-pay | Admitting: *Deleted

## 2019-04-20 MED ORDER — ATORVASTATIN CALCIUM 10 MG PO TABS: ORAL_TABLET | ORAL | 1 refills | Status: DC

## 2019-04-20 NOTE — Telephone Encounter (Unsigned)
Copied from [place] [id number]. Topic: Quick Communication - Rx Refill/Question >> Apr 20, 2019 10:34 AM Yvette Rack wrote: Pt stated she is completely out of the medication Medication:  atorvastatin (LIPITOR) 10 MG tablet   Has the patient contacted their pharmacy? yes   Preferred Pharmacy (with phone number or street name): [id number] - [place], [place] - [address] [phone] (Phone)  [phone] (Fax)  Agent: Please be advised that RX refills may take up to 3 business days. We ask that you follow-up with your pharmacy.

## 2019-04-20 NOTE — Telephone Encounter (Signed)
Lipitor refill has been sent in.

## 2019-04-20 NOTE — Telephone Encounter (Signed)
Medication has been refilled.

## 2019-04-30 ENCOUNTER — Other Ambulatory Visit: Payer: Self-pay

## 2019-04-30 ENCOUNTER — Ambulatory Visit
Admission: RE | Admit: 2019-04-30 | Discharge: 2019-04-30 | Disposition: A | Discharge status: Home / Self Care | Payer: PPO | Source: Ambulatory Visit | Attending: Neurology | Admitting: Neurology

## 2019-04-30 DIAGNOSIS — G20 Parkinson's disease: Secondary | ICD-10-CM | POA: Diagnosis not present

## 2019-05-01 ENCOUNTER — Encounter: Payer: Self-pay | Admitting: Internal Medicine

## 2019-05-04 ENCOUNTER — Other Ambulatory Visit: Payer: Self-pay

## 2019-05-04 MED ORDER — PREGABALIN 75 MG PO CAPS: ORAL_CAPSULE | ORAL | 5 refills | Status: DC

## 2019-05-04 NOTE — Telephone Encounter (Signed)
Refilled: 11/09/2018 Last OV: 03/16/2019 Next OV: not scheduled

## 2019-05-10 ENCOUNTER — Telehealth: Payer: Self-pay

## 2019-05-10 NOTE — Telephone Encounter (Signed)
Copied from [place] [id number]. Topic: General - Other >> May 10, 2019 10:14 AM Carolyn Stare wrote: Manuela Neptune told pt they did not received the RX for the below medication , please resend pt will be out today   pregabalin (LYRICA) 75 MG capsule  Called the pharmacy and they stated it is ready, I called the pt and let her know.  Rasheem Figiel,cma

## 2019-05-11 DIAGNOSIS — R413 Other amnesia: Secondary | ICD-10-CM | POA: Diagnosis not present

## 2019-05-11 DIAGNOSIS — G20 Parkinson's disease: Secondary | ICD-10-CM | POA: Diagnosis not present

## 2019-05-20 DIAGNOSIS — R739 Hyperglycemia, unspecified: Secondary | ICD-10-CM | POA: Diagnosis not present

## 2019-05-20 DIAGNOSIS — E538 Deficiency of other specified B group vitamins: Secondary | ICD-10-CM | POA: Diagnosis not present

## 2019-05-20 DIAGNOSIS — G5793 Unspecified mononeuropathy of bilateral lower limbs: Secondary | ICD-10-CM | POA: Diagnosis not present

## 2019-05-20 DIAGNOSIS — E559 Vitamin D deficiency, unspecified: Secondary | ICD-10-CM | POA: Diagnosis not present

## 2019-06-10 DIAGNOSIS — M79605 Pain in left leg: Secondary | ICD-10-CM | POA: Diagnosis not present

## 2019-06-10 DIAGNOSIS — M79604 Pain in right leg: Secondary | ICD-10-CM | POA: Diagnosis not present

## 2019-06-10 DIAGNOSIS — Z79899 Other long term (current) drug therapy: Secondary | ICD-10-CM | POA: Diagnosis not present

## 2019-06-11 DIAGNOSIS — F4321 Adjustment disorder with depressed mood: Secondary | ICD-10-CM | POA: Diagnosis not present

## 2019-06-11 DIAGNOSIS — G4700 Insomnia, unspecified: Secondary | ICD-10-CM | POA: Diagnosis not present

## 2019-06-11 DIAGNOSIS — F411 Generalized anxiety disorder: Secondary | ICD-10-CM | POA: Diagnosis not present

## 2019-06-16 ENCOUNTER — Telehealth: Payer: Self-pay

## 2019-06-16 ENCOUNTER — Ambulatory Visit (INDEPENDENT_AMBULATORY_CARE_PROVIDER_SITE_OTHER): Payer: PPO

## 2019-06-16 ENCOUNTER — Other Ambulatory Visit: Payer: Self-pay

## 2019-06-16 ENCOUNTER — Ambulatory Visit: Payer: PPO

## 2019-06-16 DIAGNOSIS — Z0000 Encounter for general adult medical examination without abnormal findings: Secondary | ICD-10-CM

## 2019-06-16 NOTE — Telephone Encounter (Signed)
Called patient for scheduled annual wellness and health maintenance updates for her doctor. No answer. Left voicemail with my direct dial for 30 minute call back or reschedule. Please reschedule as appropriate.

## 2019-06-16 NOTE — Progress Notes (Addendum)
Subjective:   Deborah Velazquez is a 73 y.o. female who presents for an Initial Medicare Annual Wellness Visit.  Review of Systems    No ROS.  Medicare Wellness Virtual Visit.  Visual/audio telehealth visit, UTA vital signs.   See social history for additional risk factors.    Cardiac Risk Factors include: advanced age (>40men, >33 women)     Objective:    Today's Vitals   There is no height or weight on file to calculate BMI.  Advanced Directives 06/16/2019 10/15/2018 10/13/2018 09/28/2018 06/30/2018 02/02/2018 06/20/2015  Does Patient Have a Medical Advance Directive? Yes Yes Yes Yes Yes Yes Yes  Type of Paramedic of [place];Living will [place];Living will [place];Living will [place];Living will [place];Living will [place];Living will -  Does patient want to make changes to medical advance directive? No - Patient declined No - Patient declined No - Patient declined No - Patient declined - No - Patient declined -  Copy of [place] in Chart? No - copy requested - No - copy requested No - copy requested - No - copy requested -    Current Medications (verified) Outpatient Encounter Medications as of 06/16/2019  Medication Sig  . atorvastatin (LIPITOR) 10 MG tablet TAKE 1 TABLET(10 MG) BY MOUTH AT BEDTIME  . carbidopa-levodopa (SINEMET CR) 50-200 MG tablet Take 1 tablet by mouth at bedtime.  . carbidopa-levodopa (SINEMET IR) 25-100 MG tablet 2 in the morning, 1 in the afternoon, 1 in the evening  . Coenzyme Q10 100 MG capsule Take 100 mg by mouth daily.   . DULoxetine (CYMBALTA) 60 MG capsule Take 60 mg by mouth daily.   Marland Kitchen estradiol (ESTRACE) 0.1 MG/GM vaginal cream Place 1 Applicatorful vaginally at bedtime. For 2 weeks,  Then twice weekly thereafter  . hydrOXYzine (ATARAX/VISTARIL) 25 MG tablet   . Omega-3 Fatty Acids (FISH OIL) 1000 MG  CAPS Take 1,000 mg by mouth daily.   . pregabalin (LYRICA) 150 MG capsule Take by mouth.  . pregabalin (LYRICA) 75 MG capsule TAKE 1 CAPSULE(75 MG) BY MOUTH TWICE DAILY  . sertraline (ZOLOFT) 100 MG tablet Take 100 mg by mouth daily.  . traZODone (DESYREL) 50 MG tablet   . guaiFENesin-codeine (CHERATUSSIN AC) 100-10 MG/5ML syrup Take 5 mLs by mouth 4 (four) times daily as needed for cough.   No facility-administered encounter medications on file as of 06/16/2019.     Allergies (verified) Pseudoephedrine hcl and Sudafed [pseudoephedrine]   History: Past Medical History:  Diagnosis Date  . Anxiety   . Arthritis   . Hyperlipemia   . Irritable bowel syndrome   . Parkinson disease ([place])   . Restless leg syndrome   . RSD (reflex sympathetic dystrophy)    pt denies   Past Surgical History:  Procedure Laterality Date  . APPENDECTOMY    . BREAST BIOPSY Left 08/16/2013   neg  . CATARACT EXTRACTION Left   . COLONOSCOPY    . COLONOSCOPY WITH PROPOFOL N/A 06/20/2015   Procedure: COLONOSCOPY WITH PROPOFOL;  Surgeon: Lollie Sails, MD;  Location[facility] ENDOSCOPY;  Service: Endoscopy;  Laterality: N/A;  . EYE SURGERY     Cataract in one eye  . FRACTURE SURGERY     wrist - right  . PARS PLANA VITRECTOMY Left 2010   Family History  Problem Relation Age of Onset  . Breast cancer Mother 54  . Diabetes  Mother   . Cancer Mother   . Heart disease Mother   . Alzheimer's disease Father   . Heart disease Maternal Aunt    Social History   Socioeconomic History  . Marital status: Married    Spouse name: Not on file  . Number of children: Not on file  . Years of education: Not on file  . Highest education level: Not on file  Occupational History  . Occupation: retired    Comment: Geographical information systems officer  . Financial resource strain: Not hard at all  . Food insecurity    Worry: Never true    Inability: Never true  . Transportation needs    Medical: No    Non-medical: No  Tobacco Use   . Smoking status: Never Smoker  . Smokeless tobacco: Never Used  Substance and Sexual Activity  . Alcohol use: Yes    Comment: 1-2 x a week  . Drug use: No  . Sexual activity: Not Currently  Lifestyle  . Physical activity    Days per week: Not on file    Minutes per session: Not on file  . Stress: Only a little  Relationships  . Social Herbalist on phone: Not on file    Gets together: Not on file    Attends religious service: Not on file    Active member of club or organization: Not on file    Attends meetings of clubs or organizations: Not on file    Relationship status: Not on file  Other Topics Concern  . Not on file  Social History Narrative  . Not on file    Tobacco Counseling Counseling given: Not Answered   Clinical Intake:  Pre-visit preparation completed: Yes        Diabetes: No  How often do you need to have someone help you when you read instructions, pamphlets, or other written materials from your doctor or pharmacy?: 3 - Sometimes  Interpreter Needed?: No      Activities of Daily Living In your present state of health, do you have any difficulty performing the following activities: 06/16/2019 10/15/2018  Hearing? N N  Vision? N N  Difficulty concentrating or making decisions? Y N  Comment Difficulty with her short term memory -  Walking or climbing stairs? Y Y  Comment Impaired gait. Walker in use when ambualting. -  Dressing or bathing? Y N  Comment Caregivers assist -  Doing errands, shopping? Y Y  Comment She does not drive Facilities manager and eating ? Y N  Comment - Husband assists as needed  Using the Toilet? Y N  Comment BSC. Assisited by caregiver. Pull up brief in place. -  In the past six months, have you accidently leaked urine? Y N  Comment Managed with depend brief -  Do you have problems with loss of bowel control? N N  Managing your Medications? Tempie Donning  Comment Managed by faamily friend [name] Husband assists   Managing your Finances? Tempie Donning  Comment Managed by family friend [name] Husband assists   Housekeeping or managing your Housekeeping? Y Y  Comment Managed by caregiver Husband assists as needed  Some recent data might be hidden     Immunizations and Health Maintenance Immunization History  Administered Date(s) Administered  . Influenza, High Dose Seasonal PF 09/11/2018  . Tdap 06/24/2014   Health Maintenance Due  Topic Date Due  . Hepatitis C Screening  Sep 14, 1946  .  DEXA SCAN  07/06/2009  . PNA vac Low Risk Adult (1 of 2 - PCV13) 07/06/2009  . MAMMOGRAM  12/24/2018    Patient Care Team: [name], MD as PCP - General (Internal Medicine)  Indicate any recent Medical Services you may have received from other than [facility] providers in the past year (date may be approximate).     Assessment:   This is a routine wellness examination for [place]. I connected with patient 06/16/19 at  3:00 PM EDT by a video/audio enabled telemedicine application and verified that I am speaking with the correct person using two identifiers. Patient stated full name and DOB. Patient gave permission to continue with virtual visit. Patient's location was at home and Nurse's location was at [place] office.   Patient notes her weight 143.8lb  Health Screenings  Mammogram - 11/2016. discussed. Deferred per patient request.  Colonoscopy - 05/2015 Bone Density - discussed. Deferred per patient request. Ordered 10/2018. Glaucoma -none Hearing -demonstrates normal hearing during visit. Hemoglobin A1C - 10/2018 (5.7) Cholesterol - 06/2018 Dental- UTD Vision- visits within the last 12 months Hepatitis C Screening- discussed. Deferred per patient request.   Social  Alcohol intake - yes      Smoking history- never    Smokers in home? none Illicit drug use? none Exercise - parkinson exercises, walking, strengthening weights Diet - premier protein drink daily, avoids substitute sugars, better  appetite Sexually Active -not currently BMI- discussed the importance of a healthy diet, water intake and the benefits of aerobic exercise.  Educational material provided.   Safety  Patient feels safe at home- yes Patient does have smoke detectors at home- yes Patient does wear sunscreen or protective clothing when in direct sunlight -yes Patient does wear seat belt when in a moving vehicle -yes Patient drives- no Caregivers in the home- yes  Covid-19 precautions and sickness symptoms discussed.   Activities of Daily Living [name] and her daughter assist as caregivers as needed.  Caregivers assist with bathing, dressing, cooking, cleaning.   Walker in use when ambulating.   Depression Screen Patient states she is doing better. She is taking medications as directed and staying mentally active with reading which helps.    Medication-taking as directed and without issues.   Fall Screen Patient denies falling in the last year.   Memory Screen Patient is alert.  Patient is having some difficulty with her short tern memory.  Patient likes to read aloud, play memory games, cards and complete puzzles for brain stimulation.  Immunizations The following Immunizations were discussed: Influenza, shingles, pneumonia, and tetanus.   Other Providers Patient Care Team: [name], MD as PCP - General (Internal Medicine)   Hearing/Vision screen  Hearing Screening   125Hz  250Hz  500Hz  1000Hz  2000Hz  3000Hz  4000Hz  6000Hz  8000Hz   Right ear:           Left ear:           Comments: Patient is able to hear conversational tones without difficulty.  No issues reported.   Vision Screening Comments: Wears corrective lenses Visual acuity not assessed, virtual visit.  They have seen their ophthalmologist in the last 12 months.     Dietary issues and exercise activities discussed: Current Exercise Habits: Home exercise routine, Type of exercise: stretching;walking;strength  training/weights, Intensity: Mild  Goals    . Follow up with Primary Care Provider     Follow up as needed Read more aloud Continue parkinson exercises       Depression  Screen PHQ 2/9 Scores 06/16/2019 10/15/2018 03/06/2018 02/02/2018  PHQ - 2 Score 1 0 3 6  PHQ- 9 Score - - 15 22    Fall Risk Fall Risk  06/16/2019 01/20/2019 11/09/2018 10/27/2018 10/15/2018  Falls in the past year? 0 0 (No Data) 0 0  Comment - - Denies new/ recent falls today Denies new/ recent falls -  Number falls in past yr: - 0 - 0 -  Comment - - - Denies falls over last year -  Injury with Fall? - 0 - 0 -  Comment - - - N/A -  Risk Factor Category  - - - - -  Risk for fall due to : - - - Other (Comment);Medication side effect Medication side effect  Risk for fall due to: Comment - - - Neuropathy -  Follow up - Falls evaluation completed Falls prevention discussed - Falls prevention discussed    Is the patient's home free of loose throw rugs in walkways, pet beds, electrical cords, etc?  yes      Grab bars in the bathroom? yes      Handrails on the stairs?  yes      Adequate lighting?  yes  Cognitive Function:   Montreal Cognitive Assessment  12/16/2017  Visuospatial/ Executive (0/5) 3  Naming (0/3) 2  Attention: Read list of digits (0/2) 2  Attention: Read list of letters (0/1) 1  Attention: Serial 7 subtraction starting at 100 (0/3) 1  Language: Repeat phrase (0/2) 2  Language : Fluency (0/1) 1  Abstraction (0/2) 1  Delayed Recall (0/5) 4  Orientation (0/6) 6  Total 23  Adjusted Score (based on education) 23   6CIT Screen 06/16/2019  What Year? 0 points  What month? 0 points  What time? 0 points  Count back from 20 0 points  Months in reverse 0 points    Screening Tests Health Maintenance  Topic Date Due  . Hepatitis C Screening  October 21, 1946  . DEXA SCAN  07/06/2009  . PNA vac Low Risk Adult (1 of 2 - PCV13) 07/06/2009  . MAMMOGRAM  12/24/2018  . INFLUENZA VACCINE  06/26/2019  .  TETANUS/TDAP  06/24/2024  . COLONOSCOPY  06/19/2025     Plan:    End of life planning; Advance aging; Advanced directives discussed.  Copy of current HCPOA/Living Will requested.    I have personally reviewed and noted the following in the patient's chart:   . Medical and social history . Use of alcohol, tobacco or illicit drugs  . Current medications and supplements . Functional ability and status . Nutritional status . Physical activity . Advanced directives . List of other physicians . Hospitalizations, surgeries, and ER visits in previous 12 months . Vitals . Screenings to include cognitive, depression, and falls . Referrals and appointments  In addition, I have reviewed and discussed with patient certain preventive protocols, quality metrics, and best practice recommendations. A written personalized care plan for preventive services as well as general preventive health recommendations were provided to patient.     OBrien-Blaney, Rachel Samples L, LPN   [id number]/91/[id number]    I have reviewed the above information and agree with above.   Deborra Medina, MD

## 2019-06-16 NOTE — Patient Instructions (Addendum)
  Ms. Palmisano , Thank you for taking time to come for your Medicare Wellness Visit. I appreciate your ongoing commitment to your health goals. Please review the following plan we discussed and let me know if I can assist you in the future.   These are the goals we discussed: Goals    . Follow up with Primary Care Provider     Follow up as needed Read more aloud Continue parkinson exercises        This is a list of the screening recommended for you and due dates:  Health Maintenance  Topic Date Due  .  Hepatitis C: One time screening is recommended by Center for Disease Control  (CDC) for  adults born from [birth year] through 1967.   [birth year]  . DEXA scan (bone density measurement)  07/06/2009  . Pneumonia vaccines (1 of 2 - PCV13) 07/06/2009  . Mammogram  12/24/2018  . Flu Shot  06/26/2019  . Tetanus Vaccine  06/24/2024  . Colon Cancer Screening  06/19/2025

## 2019-06-17 ENCOUNTER — Ambulatory Visit: Payer: PPO

## 2019-07-01 DIAGNOSIS — G5793 Unspecified mononeuropathy of bilateral lower limbs: Secondary | ICD-10-CM | POA: Diagnosis not present

## 2019-07-01 DIAGNOSIS — R2681 Unsteadiness on feet: Secondary | ICD-10-CM | POA: Diagnosis not present

## 2019-07-01 DIAGNOSIS — G20 Parkinson's disease: Secondary | ICD-10-CM | POA: Diagnosis not present

## 2019-07-01 DIAGNOSIS — E538 Deficiency of other specified B group vitamins: Secondary | ICD-10-CM | POA: Diagnosis not present

## 2019-07-16 NOTE — Telephone Encounter (Signed)
err

## 2019-07-22 DIAGNOSIS — G3184 Mild cognitive impairment, so stated: Secondary | ICD-10-CM | POA: Diagnosis not present

## 2019-07-22 DIAGNOSIS — E538 Deficiency of other specified B group vitamins: Secondary | ICD-10-CM | POA: Diagnosis not present

## 2019-07-22 DIAGNOSIS — T600X1A Toxic effect of organophosphate and carbamate insecticides, accidental (unintentional), initial encounter: Secondary | ICD-10-CM | POA: Diagnosis not present

## 2019-07-22 DIAGNOSIS — G5793 Unspecified mononeuropathy of bilateral lower limbs: Secondary | ICD-10-CM | POA: Diagnosis not present

## 2019-07-27 DIAGNOSIS — T600X1A Toxic effect of organophosphate and carbamate insecticides, accidental (unintentional), initial encounter: Secondary | ICD-10-CM | POA: Diagnosis not present

## 2019-07-27 DIAGNOSIS — G3184 Mild cognitive impairment, so stated: Secondary | ICD-10-CM | POA: Diagnosis not present

## 2019-07-27 DIAGNOSIS — G5793 Unspecified mononeuropathy of bilateral lower limbs: Secondary | ICD-10-CM | POA: Diagnosis not present

## 2019-07-27 DIAGNOSIS — E538 Deficiency of other specified B group vitamins: Secondary | ICD-10-CM | POA: Diagnosis not present

## 2019-09-09 DIAGNOSIS — G4700 Insomnia, unspecified: Secondary | ICD-10-CM | POA: Diagnosis not present

## 2019-09-09 DIAGNOSIS — F411 Generalized anxiety disorder: Secondary | ICD-10-CM | POA: Diagnosis not present

## 2019-09-09 DIAGNOSIS — F4321 Adjustment disorder with depressed mood: Secondary | ICD-10-CM | POA: Diagnosis not present

## 2019-10-06 ENCOUNTER — Other Ambulatory Visit: Payer: Self-pay

## 2019-10-07 ENCOUNTER — Ambulatory Visit (INDEPENDENT_AMBULATORY_CARE_PROVIDER_SITE_OTHER): Payer: PPO | Admitting: Family Medicine

## 2019-10-07 ENCOUNTER — Other Ambulatory Visit: Payer: Self-pay

## 2019-10-07 ENCOUNTER — Encounter: Payer: Self-pay | Admitting: Family Medicine

## 2019-10-07 VITALS — BP 118/68 | HR 82 | Temp 96.8°F | Wt 154.6 lb

## 2019-10-07 DIAGNOSIS — R300 Dysuria: Secondary | ICD-10-CM

## 2019-10-07 DIAGNOSIS — N390 Urinary tract infection, site not specified: Secondary | ICD-10-CM | POA: Diagnosis not present

## 2019-10-07 LAB — POCT URINALYSIS DIPSTICK
Bilirubin, UA: NEGATIVE
Blood, UA: NEGATIVE
Glucose, UA: NEGATIVE
Ketones, UA: NEGATIVE
Nitrite, UA: POSITIVE
Protein, UA: NEGATIVE
Spec Grav, UA: 1.015 (ref 1.010–1.025)
Urobilinogen, UA: 0.2 E.U./dL
pH, UA: 6 (ref 5.0–8.0)

## 2019-10-07 MED ORDER — CEPHALEXIN 500 MG PO CAPS: 500.0000 mg | ORAL_CAPSULE | Freq: Two times a day (BID) | ORAL | 0 refills | Status: DC

## 2019-10-07 NOTE — Patient Instructions (Signed)
Urinary Tract Infection, Adult A urinary tract infection (UTI) is an infection of any part of the urinary tract. The urinary tract includes:  The kidneys.  The ureters.  The bladder.  The urethra. These organs make, store, and get rid of pee (urine) in the body. What are the causes? This is caused by germs (bacteria) in your genital area. These germs grow and cause swelling (inflammation) of your urinary tract. What increases the risk? You are more likely to develop this condition if:  You have a small, thin tube (catheter) to drain pee.  You cannot control when you pee or poop (incontinence).  You are female, and: ? You use these methods to prevent pregnancy: ? A medicine that kills sperm (spermicide). ? A device that blocks sperm (diaphragm). ? You have low levels of a female hormone (estrogen). ? You are pregnant.  You have genes that add to your risk.  You are sexually active.  You take antibiotic medicines.  You have trouble peeing because of: ? A prostate that is bigger than normal, if you are female. ? A blockage in the part of your body that drains pee from the bladder (urethra). ? A kidney stone. ? A nerve condition that affects your bladder (neurogenic bladder). ? Not getting enough to drink. ? Not peeing often enough.  You have other conditions, such as: ? Diabetes. ? A weak disease-fighting system (immune system). ? Sickle cell disease. ? Gout. ? Injury of the spine. What are the signs or symptoms? Symptoms of this condition include:  Needing to pee right away (urgently).  Peeing often.  Peeing small amounts often.  Pain or burning when peeing.  Blood in the pee.  Pee that smells bad or not like normal.  Trouble peeing.  Pee that is cloudy.  Fluid coming from the vagina, if you are female.  Pain in the belly or lower back. Other symptoms include:  Throwing up (vomiting).  No urge to eat.  Feeling mixed up (confused).  Being tired  and grouchy (irritable).  A fever.  Watery poop (diarrhea). How is this treated? This condition may be treated with:  Antibiotic medicine.  Other medicines.  Drinking enough water. Follow these instructions at home:  Medicines  Take over-the-counter and prescription medicines only as told by your doctor.  If you were prescribed an antibiotic medicine, take it as told by your doctor. Do not stop taking it even if you start to feel better. General instructions  Make sure you: ? Pee until your bladder is empty. ? Do not hold pee for a long time. ? Empty your bladder after sex. ? Wipe from front to back after pooping if you are a female. Use each tissue one time when you wipe.  Drink enough fluid to keep your pee pale yellow.  Keep all follow-up visits as told by your doctor. This is important. Contact a doctor if:  You do not get better after 1-2 days.  Your symptoms go away and then come back. Get help right away if:  You have very bad back pain.  You have very bad pain in your lower belly.  You have a fever.  You are sick to your stomach (nauseous).  You are throwing up. Summary  A urinary tract infection (UTI) is an infection of any part of the urinary tract.  This condition is caused by germs in your genital area.  There are many risk factors for a UTI. These include having a small, thin   tube to drain pee and not being able to control when you pee or poop.  Treatment includes antibiotic medicines for germs.  Drink enough fluid to keep your pee pale yellow. This information is not intended to replace advice given to you by your health care provider. Make sure you discuss any questions you have with your health care provider. Document Released: 04/29/2008 Document Revised: 10/29/2018 Document Reviewed: 05/21/2018 Elsevier Patient Education  2020 Elsevier Inc.  

## 2019-10-07 NOTE — Progress Notes (Signed)
   Subjective:    Patient ID: Deborah Velazquez, female    DOB: 1946/07/29, 73 y.o.   MRN: [id number]  HPI   Patient presents to clinic with caregiver due to increased urinary frequency, pressure and burning for the past 3 or 4 days.  They are suspicious of UTI.  No vomiting or diarrhea.  No severe abdominal pain.  No fever or chills.  Patient Active Problem List   Diagnosis Date Noted  . Loose stools 03/16/2019  . Allergic rhinitis due to pollen 03/06/2019  . Atrophic vaginitis 11/25/2018  . Spondylolisthesis of lumbosacral region 10/07/2018  . Neuropathy 07/13/2018  . Leg cramps 05/13/2018  . Loss of memory 09/22/2017  . Parkinson's disease ([place]) 08/19/2017  . Prediabetes 07/26/2016  . Pure hypercholesterolemia 12/23/2014  . Anxiety 06/24/2014   Social History   Tobacco Use  . Smoking status: Never Smoker  . Smokeless tobacco: Never Used  Substance Use Topics  . Alcohol use: Yes    Comment: 1-2 x a week    Review of Systems  Constitutional: Negative for chills, fatigue and fever.  HENT: Negative for congestion, ear pain, sinus pain and sore throat.   Eyes: Negative.   Respiratory: Negative for cough, shortness of breath and wheezing.   Cardiovascular: Negative for chest pain, palpitations and leg swelling.  Gastrointestinal: Negative for abdominal pain, diarrhea, nausea and vomiting.  Genitourinary: +dysuria, frequency and urgency.  Musculoskeletal: Negative for arthralgias and myalgias.  Skin: Negative for color change, pallor and rash.  Neurological: Negative for syncope, light-headedness and headaches.  Psychiatric/Behavioral: The patient is not nervous/anxious.       Objective:   Physical Exam Vitals signs and nursing note reviewed.  Constitutional:      General: She is not in acute distress.    Appearance: She is not toxic-appearing.  HENT:     Head: Normocephalic and atraumatic.  Cardiovascular:     Rate and Rhythm: Normal rate and regular rhythm.   Heart sounds: Normal heart sounds.  Pulmonary:     Effort: Pulmonary effort is normal. No respiratory distress.     Breath sounds: Normal breath sounds.  Abdominal:     General: There is no distension.     Tenderness: There is no abdominal tenderness. There is no right CVA tenderness, left CVA tenderness or guarding.  Neurological:     Mental Status: She is alert and oriented to person, place, and time.     Gait: Gait abnormal (walks with cane).  Psychiatric:        Mood and Affect: Mood normal.        Behavior: Behavior normal.    Today's Vitals   10/07/19 1053  BP: 118/68  Pulse: 82  Temp: (!) 96.8 F (36 C)  TempSrc: Temporal  SpO2: 98%  Weight: 154 lb 9.6 oz (70.1 kg)   Body mass index is 24.95 kg/m.     Assessment & Plan:    UTI-patient's urinalysis is positive for leukocytes and nitrites indicative of UTI.  We will treat with Keflex twice daily for 5 days.  Urine culture sent to lab for confirmation.  She will keep a fluid intake, avoid excess caffeine and sugary beverages, wear cotton underwear and avoid holding urine for too long.  She will follow-up regularly with PCP as planned

## 2019-10-09 LAB — URINE CULTURE
MICRO NUMBER:: 1093818
SPECIMEN QUALITY:: ADEQUATE

## 2019-10-15 ENCOUNTER — Other Ambulatory Visit: Payer: Self-pay

## 2019-10-15 MED ORDER — ATORVASTATIN CALCIUM 10 MG PO TABS: ORAL_TABLET | ORAL | 1 refills | Status: DC

## 2019-12-09 DIAGNOSIS — F4321 Adjustment disorder with depressed mood: Secondary | ICD-10-CM | POA: Diagnosis not present

## 2019-12-09 DIAGNOSIS — G4700 Insomnia, unspecified: Secondary | ICD-10-CM | POA: Diagnosis not present

## 2019-12-09 DIAGNOSIS — F411 Generalized anxiety disorder: Secondary | ICD-10-CM | POA: Diagnosis not present

## 2019-12-16 ENCOUNTER — Telehealth: Payer: Self-pay | Admitting: Internal Medicine

## 2019-12-16 NOTE — Telephone Encounter (Signed)
LMTCB so I could advise on Dr. Lupita Dawn advise in regards to Covid Vaccine.

## 2019-12-16 NOTE — Telephone Encounter (Signed)
Pt called wanting to know if its ok for her to have covid vaccine. Please advise and thank you!  Call pt @ [phone].

## 2019-12-16 NOTE — Telephone Encounter (Signed)
  I see no reason why she should not receive the covid vaccine UNLESS she has had a bad reaction to another vaccine  (swelling of lips or tongue,  Trouble breathing etc) or to Miralax

## 2019-12-30 DIAGNOSIS — G4752 REM sleep behavior disorder: Secondary | ICD-10-CM | POA: Diagnosis not present

## 2019-12-30 DIAGNOSIS — F5104 Psychophysiologic insomnia: Secondary | ICD-10-CM | POA: Diagnosis not present

## 2019-12-30 DIAGNOSIS — G3184 Mild cognitive impairment, so stated: Secondary | ICD-10-CM | POA: Diagnosis not present

## 2019-12-30 DIAGNOSIS — G20 Parkinson's disease: Secondary | ICD-10-CM | POA: Diagnosis not present

## 2019-12-30 DIAGNOSIS — G5793 Unspecified mononeuropathy of bilateral lower limbs: Secondary | ICD-10-CM | POA: Diagnosis not present

## 2020-01-17 ENCOUNTER — Other Ambulatory Visit: Payer: Self-pay

## 2020-01-17 MED ORDER — ESTRADIOL 0.1 MG/GM VA CREA: 1.0000 | TOPICAL_CREAM | Freq: Every day | VAGINAL | 12 refills | Status: DC

## 2020-03-09 DIAGNOSIS — F411 Generalized anxiety disorder: Secondary | ICD-10-CM | POA: Diagnosis not present

## 2020-03-09 DIAGNOSIS — G4700 Insomnia, unspecified: Secondary | ICD-10-CM | POA: Diagnosis not present

## 2020-03-09 DIAGNOSIS — F4321 Adjustment disorder with depressed mood: Secondary | ICD-10-CM | POA: Diagnosis not present

## 2020-03-27 DIAGNOSIS — H353131 Nonexudative age-related macular degeneration, bilateral, early dry stage: Secondary | ICD-10-CM | POA: Diagnosis not present

## 2020-04-04 ENCOUNTER — Other Ambulatory Visit: Payer: Self-pay

## 2020-04-04 MED ORDER — ATORVASTATIN CALCIUM 10 MG PO TABS: ORAL_TABLET | ORAL | 0 refills | Status: DC

## 2020-04-25 ENCOUNTER — Encounter: Payer: Self-pay | Admitting: Physical Therapy

## 2020-04-25 ENCOUNTER — Ambulatory Visit: Payer: PPO | Attending: Neurology | Admitting: Physical Therapy

## 2020-04-25 ENCOUNTER — Other Ambulatory Visit: Payer: Self-pay

## 2020-04-25 DIAGNOSIS — M79604 Pain in right leg: Secondary | ICD-10-CM | POA: Insufficient documentation

## 2020-04-25 DIAGNOSIS — R262 Difficulty in walking, not elsewhere classified: Secondary | ICD-10-CM | POA: Insufficient documentation

## 2020-04-25 DIAGNOSIS — M6281 Muscle weakness (generalized): Secondary | ICD-10-CM | POA: Diagnosis not present

## 2020-04-25 DIAGNOSIS — R2689 Other abnormalities of gait and mobility: Secondary | ICD-10-CM | POA: Insufficient documentation

## 2020-04-25 NOTE — Therapy (Signed)
[place] MAIN [facility] SERVICES [address] [place], [place], [address] Phone: [phone]   Fax:  [phone]  Physical Therapy Evaluation  Patient Details  Name: Deborah Velazquez MRN: [id number] Date of Birth: 1946/09/24 Referring Provider (PT): Jennings Books   Encounter Date: 04/25/2020  PT End of Session - 04/25/20 1524    Visit Number  1    Number of Visits  17    Date for PT Re-Evaluation  09/29/18    PT Start Time  0320    PT Stop Time  0415    PT Time Calculation (min)  55 min    Equipment Utilized During Treatment  Gait belt    Activity Tolerance  Patient tolerated treatment well    Behavior During Therapy  WFL for tasks assessed/performed       Past Medical History:  Diagnosis Date  . Anxiety   . Arthritis   . Hyperlipemia   . Irritable bowel syndrome   . Parkinson disease ([place])   . Restless leg syndrome   . RSD (reflex sympathetic dystrophy)    pt denies    Past Surgical History:  Procedure Laterality Date  . APPENDECTOMY    . BREAST BIOPSY Left 08/16/2013   neg  . CATARACT EXTRACTION Left   . COLONOSCOPY    . COLONOSCOPY WITH PROPOFOL N/A 06/20/2015   Procedure: COLONOSCOPY WITH PROPOFOL;  Surgeon: Lollie Sails, MD;  Location: [facility] ENDOSCOPY;  Service: Endoscopy;  Laterality: N/A;  . EYE SURGERY     Cataract in one eye  . FRACTURE SURGERY     wrist - right  . PARS PLANA VITRECTOMY Left 2010    There were no vitals filed for this visit.   Subjective Assessment - 04/25/20 1526    Subjective  Patient is reporting that she is not steady with her walking. she has difficulty with getting in and out of her bed. She has difficulty with getting out of a chair without UE.    Pertinent History  Patient uses a spc for a year, she uses a RW for indoors. she has a rollator for outdoor use. She has not had any falls in the last 6 months.    Currently in Pain?  No/denies         [facility] PT Assessment - 04/25/20 0001      Assessment   Medical Diagnosis  parkinsons    Referring Provider (PT)  shah, Hemang    Onset Date/Surgical Date  --   2018   Prior Therapy  no      Restrictions   Weight Bearing Restrictions  No      Balance Screen   Has the patient fallen in the past 6 months  No      [place] residence    Living Arrangements  Spouse/significant other    Available Help at Discharge  Family    Type of [place] to enter    Entrance Stairs-Number of Steps  2    [place]  One level    [place] [address] [place] - single point      Prior Function   Level of Independence  Independent with household mobility with device;Needs assistance with ADLs   care givers 19 hours over 24 hrs   Vocation  Retired      Associate Professor   Overall  Cognitive Status  Within Functional Limits for tasks assessed    Attention  Focused         POSTURE: WNL   PROM/AROM:BUE and BLE WFL  STRENGTH:  Graded on a 0-5 scale Muscle Group Left Right                          Hip Flex 3/5 3/5  Hip Abd 3/5 3/5              Knee Flex 3/5 3/5  Knee Ext 3/5 3/5  Ankle DF 3/5 3/5  Ankle PF 3/5 3/5   SENSATION: WNL BUE and B feet tingling  FUNCTIONAL MOBILITY:guarded and Mi rolling Sit to stand with UE assist and SPC   BALANCE: Standing Dynamic Balance  Normal Stand independently unsupported, able to weight shift and cross midline maximally   Good Stand independently unsupported, able to weight shift and cross midline moderately   Good-/Fair+ Stand independently unsupported, able to weight shift across midline minimally   Fair Stand independently unsupported, weight shift, and reach ipsilaterally, loss of balance when crossing midline x  Poor+ Able to stand with Min A and reach ipsilaterally, unable to weight shift   Poor Able to stand with Mod A and minimally reach ipsilaterally, unable to cross midline.    Static  Standing Balance  Normal Able to maintain standing balance against maximal resistance   Good Able to maintain standing balance against moderate resistance   Good-/Fair+ Able to maintain standing balance against minimal resistance   Fair Able to stand unsupported without UE support and without LOB for 1-2 min x  Fair- Requires Min A and UE support to maintain standing without loss of balance   Poor+ Requires mod A and UE support to maintain standing without loss of balance   Poor Requires max A and UE support to maintain standing balance without loss       GAIT: Patient ambulates with SPC intermediate distances with slow shuffling gait and narrow base of support  OUTCOME MEASURES: TEST Outcome Interpretation  5 times sit<>stand 24.05sec >16 yo, >15 sec indicates increased risk for falls  10 meter walk test       .48          m/s <1.0 m/s indicates increased risk for falls; limited community ambulator  Timed up and Go     23.44            sec <14 sec indicates increased risk for falls  6 minute walk test    385            Feet 1000 feet is community ambulator      [address] Peg Test L:    36.47            R: 36.25                Objective measurements completed on examination: See above findings.              PT Education - 04/25/20 1524    Education Details  plan of care    Person(s) Educated  Patient    Methods  Explanation    Comprehension  Verbalized understanding       PT Short Term Goals - 07/01/18 0839      PT SHORT TERM GOAL #1   Title  Patient will be independent in home exercise program to improve strength/mobility for better functional independence with ADLs.  Time  4    Period  Weeks    Status  New    Target Date  07/29/18      PT SHORT TERM GOAL #2   Title  Patient (> 58 years old) will complete five times sit to stand test in < 15 seconds indicating an increased LE strength and improved balance.    Time  4    Period  Weeks    Status  New     Target Date  07/29/18        PT Long Term Goals - 07/01/18 0841      PT LONG TERM GOAL #1   Title  Patient will reduce timed up and go to <11 seconds to reduce fall risk and demonstrate improved transfer/gait ability.    Time  8    Period  Weeks    Status  New    Target Date  08/26/18      PT LONG TERM GOAL #2   Title  Patient will report a worst pain of 3/10 on VAS in B legs  to improve tolerance with ADLs and reduced symptoms with activities.     Baseline  8/10 B legs    Time  8    Period  Weeks    Status  New    Target Date  08/26/18      PT LONG TERM GOAL #3   Title  Patient will increase lower extremity functional scale to >60/80 to demonstrate improved functional mobility and increased tolerance with ADLs.     Time  8    Period  Weeks    Status  New    Target Date  08/26/18             Plan - 04/25/20 1525    Clinical Impression Statement  Patient presents with decreased BLE strength, decreased gait speed, decreased standing balance.  Patient's main complaint is pain and inability to participate in desired activities. Patient will benefit from skilled therapy in order to increase LE strength and standing balance and increase gait speed and improve her ability to participate in desired activities and decrease her falls risk.    Rehab Potential  Good    PT Frequency  4x / week    PT Duration  4 weeks    PT Treatment/Interventions  Therapeutic exercise;Therapeutic activities;Balance training;Patient/family education;Manual techniques;Dry needling    PT Next Visit Plan  manual therpay, stretching    Consulted and Agree with Plan of Care  Patient       Patient will benefit from skilled therapeutic intervention in order to improve the following deficits and impairments:  Abnormal gait, Difficulty walking, Decreased activity tolerance, Decreased strength, Impaired flexibility, Pain  Visit Diagnosis: Difficulty in walking, not elsewhere classified  Other  abnormalities of gait and mobility  Muscle weakness (generalized)     Problem List Patient Active Problem List   Diagnosis Date Noted  . Loose stools 03/16/2019  . Allergic rhinitis due to pollen 03/06/2019  . Atrophic vaginitis 11/25/2018  . Spondylolisthesis of lumbosacral region 10/07/2018  . Neuropathy 07/13/2018  . Leg cramps 05/13/2018  . Loss of memory 09/22/2017  . Parkinson's disease ([place]) 08/19/2017  . Prediabetes 07/26/2016  . Pure hypercholesterolemia 12/23/2014  . Anxiety 06/24/2014    Alanson Puls , PT DPT 04/25/2020, 3:34 PM  [place] MAIN [facility] SERVICES [address] [place], [place], [address] Phone: [phone]   Fax:  [phone]  Name: Deborah  MAHALAH Velazquez MRN: [id number] Date of Birth: Apr 21, 1946

## 2020-05-01 ENCOUNTER — Other Ambulatory Visit: Payer: Self-pay

## 2020-05-01 ENCOUNTER — Encounter: Payer: Self-pay | Admitting: Physical Therapy

## 2020-05-01 ENCOUNTER — Ambulatory Visit: Payer: PPO | Admitting: Physical Therapy

## 2020-05-01 DIAGNOSIS — R262 Difficulty in walking, not elsewhere classified: Secondary | ICD-10-CM | POA: Diagnosis not present

## 2020-05-01 DIAGNOSIS — M79604 Pain in right leg: Secondary | ICD-10-CM

## 2020-05-01 DIAGNOSIS — R2689 Other abnormalities of gait and mobility: Secondary | ICD-10-CM

## 2020-05-01 DIAGNOSIS — M6281 Muscle weakness (generalized): Secondary | ICD-10-CM

## 2020-05-01 NOTE — Therapy (Signed)
[place] MAIN [facility] SERVICES [address] [place], [place], [address] Phone: [phone]   Fax:  [phone]  Physical Therapy Treatment  Patient Details  Name: Deborah Velazquez MRN: [id number] Date of Birth: September 18, 1946 Referring Provider (PT): Jennings Books   Encounter Date: 05/01/2020  PT End of Session - 05/01/20 1631    Visit Number  2    Number of Visits  17    Date for PT Re-Evaluation  09/29/18    PT Start Time  0315    PT Stop Time  0410    PT Time Calculation (min)  55 min    Equipment Utilized During Treatment  Gait belt    Activity Tolerance  Patient tolerated treatment well    Behavior During Therapy  WFL for tasks assessed/performed       Past Medical History:  Diagnosis Date  . Anxiety   . Arthritis   . Hyperlipemia   . Irritable bowel syndrome   . Parkinson disease ([place])   . Restless leg syndrome   . RSD (reflex sympathetic dystrophy)    pt denies    Past Surgical History:  Procedure Laterality Date  . APPENDECTOMY    . BREAST BIOPSY Left 08/16/2013   neg  . CATARACT EXTRACTION Left   . COLONOSCOPY    . COLONOSCOPY WITH PROPOFOL N/A 06/20/2015   Procedure: COLONOSCOPY WITH PROPOFOL;  Surgeon: Lollie Sails, MD;  Location: [facility] ENDOSCOPY;  Service: Endoscopy;  Laterality: N/A;  . EYE SURGERY     Cataract in one eye  . FRACTURE SURGERY     wrist - right  . PARS PLANA VITRECTOMY Left 2010    There were no vitals filed for this visit.  Subjective Assessment - 05/01/20 1630    Subjective  Patient is ready to begin her LSVT BIG exercises today.    Pertinent History  Patient uses a spc for a year, she uses a RW for indoors. she has a rollator for outdoor use. She has not had any falls in the last 6 months.    Currently in Pain?  No/denies    Pain Score  0-No pain    Pain Onset  More than a month ago        Treatment:   Patient seen for LSVT Daily Session Maximal Daily Exercises for  facilitation/coordination of movement Sustained movements are designed to rescale the amplitude of movement output for generalization to daily functional activities .Performed as follows for 1 set of 10 repetitions each multidirectional sustained movements  1) Floor to ceiling , cues to hold for 10 seconds,needs modeling for correct positions , VC to reach out further and to reach up to the ceiling 2) Side to side multidirectional Repetitive movements performed in sitting and are designed to provide retraining effort needed for sustained muscle activation in tasks , cues to lean fwd and hold position x 10 counts: Patient does not move fwd<>sidesitting<> to fwd and is not able to transition or stretch out leg very far 3) Step and reach forward , cues for good knee flex, cues to move UE and LE together, cues to rotate  arms up to pronate his forearms 4) Step and reach backwards , cues for BUE back, getting toe up and bending back knee 5) Step and reach sideways, cues to turn  head sideways, and turn head back to neutral, and to rotate arms and pronate forearms 6) Rock and reach forward/backward , cues to reach far fwd  with UE's, patient not comfortable with feet apart , not rocking very far to get a good weight shift, not able to raise  heel or raise  toes much 7) Rock and reach sideways, cues for twist and look behind , only rotates side ways, unable to twist around or turn to look behind   functional component task with supervision 5 reps and simulated activities for: 1. Sit to stand x 10   needs cues to begin with UE up for correct start position  Patient performed with instruction, verbal cues, tactile cues of therapist: goal: increase tissue extensibility, promote proper posture, improve mobility  Pt educated throughout session about proper posture and technique with exercises. Improved exercise technique, movement at target joints, use of target muscles after min to mod verbal, visual, tactile  cues.                         PT Education - 05/01/20 1631    Education Details  HEP LSVT BIG    Person(s) Educated  Patient;Other (comment)    Methods  Explanation;Demonstration;Tactile cues;Verbal cues;Handout    Comprehension  Returned demonstration;Verbal cues required;Tactile cues required;Need further instruction       PT Short Term Goals - 07/01/18 1856      PT SHORT TERM GOAL #1   Title  Patient will be independent in home exercise program to improve strength/mobility for better functional independence with ADLs.    Time  4    Period  Weeks    Status  New    Target Date  07/29/18      PT SHORT TERM GOAL #2   Title  Patient (> 66 years old) will complete five times sit to stand test in < 15 seconds indicating an increased LE strength and improved balance.    Time  4    Period  Weeks    Status  New    Target Date  07/29/18        PT Long Term Goals - 04/25/20 1729      PT LONG TERM GOAL #1   Title  Patient will reduce timed up and go to <11 seconds to reduce fall risk and demonstrate improved transfer/gait ability.    Time  4    Period  Weeks    Status  New    Target Date  06/06/20      PT LONG TERM GOAL #2   Title  Patient will increase 10 meter walk test to >1.66m/s as to improve gait speed for better community ambulation and to reduce fall risk.    Time  4    Period  Weeks    Status  New    Target Date  06/06/20      PT LONG TERM GOAL #3   Title  Patient will reduce timed up and go to <11 seconds to reduce fall risk and demonstrate improved transfer/gait ability.    Time  4    Period  Weeks    Status  New    Target Date  06/06/20            Plan - 05/01/20 1632    Clinical Impression Statement  Max cueing needed to appropriately perform LSVT tasks with leg, hand, and head position. Decreased coordination demonstrated requiring consistent verbal cueing to correct form. Cognitive understanding of task was delayed. Patient  continues to demonstrate some in coordination of movement with select exercises such as rock and  reach and stepping backwards. Patient responds well to verbal and tactile cues to correct form and technique.  CGA to SBA for safety with activities.  Uses to increase intensity and amplitude of movements throughout session   Rehab Potential  Good    PT Frequency  4x / week    PT Duration  4 weeks    PT Treatment/Interventions  Therapeutic exercise;Therapeutic activities;Balance training;Patient/family education;Manual techniques;Dry needling;Neuromuscular re-education    PT Next Visit Plan  manual therpay, stretching    Consulted and Agree with Plan of Care  Patient       Patient will benefit from skilled therapeutic intervention in order to improve the following deficits and impairments:  Abnormal gait, Difficulty walking, Decreased activity tolerance, Decreased strength, Impaired flexibility, Pain  Visit Diagnosis: Other abnormalities of gait and mobility  Difficulty in walking, not elsewhere classified  Pain of right lower extremity  Muscle weakness (generalized)     Problem List Patient Active Problem List   Diagnosis Date Noted  . Loose stools 03/16/2019  . Allergic rhinitis due to pollen 03/06/2019  . Atrophic vaginitis 11/25/2018  . Spondylolisthesis of lumbosacral region 10/07/2018  . Neuropathy 07/13/2018  . Leg cramps 05/13/2018  . Loss of memory 09/22/2017  . Parkinson's disease ([place]) 08/19/2017  . Prediabetes 07/26/2016  . Pure hypercholesterolemia 12/23/2014  . Anxiety 06/24/2014    Alanson Puls, PT DPT 05/01/2020, 4:32 PM  [place] MAIN [facility] SERVICES [address] [place], [place], [address] Phone: [phone]   Fax:  [phone]  Name: Deborah Velazquez MRN: [id number] Date of Birth: [birth year]

## 2020-05-02 ENCOUNTER — Ambulatory Visit: Payer: PPO | Admitting: Physical Therapy

## 2020-05-02 ENCOUNTER — Other Ambulatory Visit: Payer: Self-pay

## 2020-05-02 ENCOUNTER — Encounter: Payer: Self-pay | Admitting: Physical Therapy

## 2020-05-02 DIAGNOSIS — R2689 Other abnormalities of gait and mobility: Secondary | ICD-10-CM

## 2020-05-02 DIAGNOSIS — M6281 Muscle weakness (generalized): Secondary | ICD-10-CM

## 2020-05-02 DIAGNOSIS — M79604 Pain in right leg: Secondary | ICD-10-CM

## 2020-05-02 DIAGNOSIS — R262 Difficulty in walking, not elsewhere classified: Secondary | ICD-10-CM

## 2020-05-02 NOTE — Therapy (Signed)
[place] MAIN [facility] SERVICES [address] [place], [place], [address] Phone: [phone]   Fax:  [phone]  Physical Therapy Treatment  Patient Details  Name: Deborah Velazquez MRN: [id number] Date of Birth: 30-Nov-1945 Referring Provider (PT): Jennings Books   Encounter Date: 05/02/2020  PT End of Session - 05/02/20 1638    Visit Number  3    Number of Visits  17    Date for PT Re-Evaluation  09/29/18    PT Start Time  0315    PT Stop Time  0415    PT Time Calculation (min)  60 min    Equipment Utilized During Treatment  Gait belt    Activity Tolerance  Patient tolerated treatment well    Behavior During Therapy  WFL for tasks assessed/performed       Past Medical History:  Diagnosis Date  . Anxiety   . Arthritis   . Hyperlipemia   . Irritable bowel syndrome   . Parkinson disease ([place])   . Restless leg syndrome   . RSD (reflex sympathetic dystrophy)    pt denies    Past Surgical History:  Procedure Laterality Date  . APPENDECTOMY    . BREAST BIOPSY Left 08/16/2013   neg  . CATARACT EXTRACTION Left   . COLONOSCOPY    . COLONOSCOPY WITH PROPOFOL N/A 06/20/2015   Procedure: COLONOSCOPY WITH PROPOFOL;  Surgeon: Lollie Sails, MD;  Location: [facility] ENDOSCOPY;  Service: Endoscopy;  Laterality: N/A;  . EYE SURGERY     Cataract in one eye  . FRACTURE SURGERY     wrist - right  . PARS PLANA VITRECTOMY Left 2010    There were no vitals filed for this visit.  Subjective Assessment - 05/02/20 1525    Subjective  Patient is ready to begin her LSVT BIG exercises today.    Pertinent History  Patient uses a spc for a year, she uses a RW for indoors. she has a rollator for outdoor use. She has not had any falls in the last 6 months.    Currently in Pain?  No/denies    Pain Score  0-No pain    Pain Onset  More than a month ago        Treatment:   Patient seen for LSVT Daily Session Maximal Daily Exercises for  facilitation/coordination of movement Sustained movements are designed to rescale the amplitude of movement output for generalization to daily functional activities .Performed as follows for 1 set of 10 repetitions each multidirectional sustained movements  1) Floor to ceiling , cues to hold for 10 seconds,needs modeling for correct positions , VC to reach out further and to reach up to the ceiling 2) Side to side multidirectional Repetitive movements performed in sitting and are designed to provide retraining effort needed for sustained muscle activation in tasks , cues to lean fwd and hold position x 10 counts: Patient does not move fwd<>sidesitting<> to fwd and is not able to transition or stretch out leg very far 3) Step and reach forward , cues for good knee flex, cues to move UE and LE together, cues to rotate  arms up to pronate his forearms 4) Step and reach backwards , cues for BUE back, getting toe up and bending back knee 5) Step and reach sideways, cues to turn  head sideways, and turn head back to neutral, and to rotate arms and pronate forearms 6) Rock and reach forward/backward , cues to reach far fwd  with UE's, patient not comfortable with feet apart , not rocking very far to get a good weight shift, not able to raise  heel or raise  toes much 7) Rock and reach sideways, cues for twist and look behind , only rotates side ways, unable to twist around or turn to look behind   functional component task with supervision 5 reps and simulated activities for: 1. Sit to stand x 10   needs cues to begin with UE up for correct start position 2. punching and pulling her coat sleeve  x 5  3.marching high RLE and LLE x 10  4.standing x 5 mins tossing balloon  Patient performed with instruction, verbal cues, tactile cues of therapist: goal: increase tissue extensibility, promote proper posture, improve mobility  Pt educated throughout session about proper posture and technique with exercises.  Improved exercise technique, movement at target joints, use of target muscles after min to mod verbal, visual, tactile cues.                         PT Education - 05/02/20 1637    Education Details  LSVT BIG    Person(s) Educated  Patient    Methods  Explanation;Demonstration;Tactile cues;Verbal cues;Handout    Comprehension  Returned demonstration;Verbal cues required;Tactile cues required;Need further instruction       PT Short Term Goals - 07/01/18 5993      PT SHORT TERM GOAL #1   Title  Patient will be independent in home exercise program to improve strength/mobility for better functional independence with ADLs.    Time  4    Period  Weeks    Status  New    Target Date  07/29/18      PT SHORT TERM GOAL #2   Title  Patient (> 76 years old) will complete five times sit to stand test in < 15 seconds indicating an increased LE strength and improved balance.    Time  4    Period  Weeks    Status  New    Target Date  07/29/18        PT Long Term Goals - 04/25/20 1729      PT LONG TERM GOAL #1   Title  Patient will reduce timed up and go to <11 seconds to reduce fall risk and demonstrate improved transfer/gait ability.    Time  4    Period  Weeks    Status  New    Target Date  06/06/20      PT LONG TERM GOAL #2   Title  Patient will increase 10 meter walk test to >1.35m/s as to improve gait speed for better community ambulation and to reduce fall risk.    Time  4    Period  Weeks    Status  New    Target Date  06/06/20      PT LONG TERM GOAL #3   Title  Patient will reduce timed up and go to <11 seconds to reduce fall risk and demonstrate improved transfer/gait ability.    Time  4    Period  Weeks    Status  New    Target Date  06/06/20            Plan - 05/02/20 1638    Clinical Impression Statement  Patient has difficulty with rock and reach exercise and uses a chair for support. She needs modeling for turning her head with side step  exercise. Patient needs cueing for BIG swing and BIG amplitude.  Patient needs cueing for correct BIG arm swing. Patient improves with practice. Patient needs cuing to consistently swing her arms and also swing reciprocally. Patient needs cuing to swing UE's reciprocally with weight shift exercise and with backward stepping and correct UE positioning.   Rehab Potential  Good    PT Frequency  4x / week    PT Duration  4 weeks    PT Treatment/Interventions  Therapeutic exercise;Therapeutic activities;Balance training;Patient/family education;Manual techniques;Dry needling;Neuromuscular re-education    PT Next Visit Plan  manual therpay, stretching    Consulted and Agree with Plan of Care  Patient       Patient will benefit from skilled therapeutic intervention in order to improve the following deficits and impairments:  Abnormal gait, Difficulty walking, Decreased activity tolerance, Decreased strength, Impaired flexibility, Pain  Visit Diagnosis: Difficulty in walking, not elsewhere classified  Other abnormalities of gait and mobility  Pain of right lower extremity  Muscle weakness (generalized)     Problem List Patient Active Problem List   Diagnosis Date Noted  . Loose stools 03/16/2019  . Allergic rhinitis due to pollen 03/06/2019  . Atrophic vaginitis 11/25/2018  . Spondylolisthesis of lumbosacral region 10/07/2018  . Neuropathy 07/13/2018  . Leg cramps 05/13/2018  . Loss of memory 09/22/2017  . Parkinson's disease ([place]) 08/19/2017  . Prediabetes 07/26/2016  . Pure hypercholesterolemia 12/23/2014  . Anxiety 06/24/2014    Alanson Puls, PT DPT 05/02/2020, 4:39 PM  [place] MAIN [facility] SERVICES [address] [place], [place], [address] Phone: [phone]   Fax:  [phone]  Name: TENLEY WINWARD MRN: [id number] Date of Birth: [birth year]

## 2020-05-03 ENCOUNTER — Other Ambulatory Visit: Payer: Self-pay

## 2020-05-03 ENCOUNTER — Encounter: Payer: Self-pay | Admitting: Physical Therapy

## 2020-05-03 ENCOUNTER — Ambulatory Visit: Payer: PPO | Admitting: Physical Therapy

## 2020-05-03 DIAGNOSIS — R2689 Other abnormalities of gait and mobility: Secondary | ICD-10-CM

## 2020-05-03 DIAGNOSIS — R262 Difficulty in walking, not elsewhere classified: Secondary | ICD-10-CM | POA: Diagnosis not present

## 2020-05-03 DIAGNOSIS — M79604 Pain in right leg: Secondary | ICD-10-CM

## 2020-05-03 DIAGNOSIS — M6281 Muscle weakness (generalized): Secondary | ICD-10-CM

## 2020-05-03 NOTE — Therapy (Signed)
[place] MAIN [facility] : [facility] SERVICES [address] [place], [place], [address] Phone: [phone]   Fax:  [phone]  Physical Therapy Treatment  Patient Details  Name: Deborah Velazquez MRN: [id number] Date of Birth: [birth year] Referring Provider (PT): shah, [place]   Encounter Date: 05/03/2020  PT End of Session - 05/03/20 1625    Visit Number  4    Number of Visits  17    Date for PT Re-Evaluation  09/29/18    PT Start Time  0315    PT Stop Time  0415    PT Time Calculation (min)  60 min    Equipment Utilized During Treatment  Gait belt    Activity Tolerance  Patient tolerated treatment well    Behavior During Therapy  WFL for tasks assessed/performed       Past Medical History:  Diagnosis Date  . Anxiety   . Arthritis   . Hyperlipemia   . Irritable bowel syndrome   . Parkinson disease ([place])   . Restless leg syndrome   . RSD (reflex sympathetic dystrophy)    pt denies    Past Surgical History:  Procedure Laterality Date  . APPENDECTOMY    . BREAST BIOPSY Left 08/16/2013   neg  . CATARACT EXTRACTION Left   . COLONOSCOPY    . COLONOSCOPY WITH PROPOFOL N/A 06/20/2015   Procedure: COLONOSCOPY WITH PROPOFOL;  Surgeon: Lollie Sails, MD;  Location: [facility] ENDOSCOPY;  Service: Endoscopy;  Laterality: N/A;  . EYE SURGERY     Cataract in one eye  . FRACTURE SURGERY     wrist - right  . PARS PLANA VITRECTOMY Left 2010    There were no vitals filed for this visit.  Subjective Assessment - 05/03/20 1624    Subjective  Patient is ready to begin her LSVT BIG exercises today.    Pertinent History  Patient uses a spc for a year, she uses a RW for indoors. she has a rollator for outdoor use. She has not had any falls in the last 6 months.    Currently in Pain?  No/denies    Pain Score  0-No pain    Pain Onset  More than a month ago      Treatment:  Treatment:   Patient seen for LSVT Daily Session Maximal Daily Exercises for  facilitation/coordination of movement Sustained movements are designed to rescale the amplitude of movement output for generalization to daily functional activities .Performed as follows for 1 set of 10 repetitions each multidirectional sustained movements  1) Floor to ceiling , cues to hold for 10 seconds,needs modeling for correct positions , VC to reach out further and to reach up to the ceiling 2) Side to side multidirectional Repetitive movements performed in sitting and are designed to provide retraining effort needed for sustained muscle activation in tasks , cues to lean fwd and hold position x 10 counts: Patient does not move fwd<>sidesitting<> to fwd and is not able to transition or stretch out leg very far 3) Step and reach forward , cues for good knee flex, cues to move UE and LE together, cues to rotate  arms up to pronate his forearms 4) Step and reach backwards , cues for BUE back, getting toe up and bending back knee 5) Step and reach sideways, cues to turn  head sideways, and turn head back to neutral, and to rotate arms and pronate forearms 6) Rock and reach forward/backward , cues to reach far fwd  with UE's, patient not comfortable with feet apart , not rocking very far to get a good weight shift, not able to raise  heel or raise  toes much 7) Rock and reach sideways, cues for twist and look behind , only rotates side ways, unable to twist around or turn to look behind   functional component task with supervision 5 reps and simulated activities for: 1. Sit to stand x 10   needs cues to begin with UE up for correct start position 2. standing marching x 10 BLE 3.putting on coat with punching into arm of coat 4.standing tolerance with balloon tapping x 10 mins 5.Need to add reaching above her head  Gait training with big swing x 100 feet  Patient performed with instruction, verbal cues, tactile cues of therapist: goal: increase tissue extensibility, promote proper posture, improve  mobility  Pt educated throughout session about proper posture and technique with exercises. Improved exercise technique, movement at target joints, use of target muscles after min to mod verbal, visual, tactile cues.                          PT Education - 05/03/20 1624    Education Details  LSVT BIG    Person(s) Educated  Patient;Caregiver(s);Other (comment)    Methods  Explanation;Demonstration;Tactile cues;Verbal cues;Handout    Comprehension  Verbalized understanding;Returned demonstration;Verbal cues required;Tactile cues required;Need further instruction       PT Short Term Goals - 07/01/18 6045      PT SHORT TERM GOAL #1   Title  Patient will be independent in home exercise program to improve strength/mobility for better functional independence with ADLs.    Time  4    Period  Weeks    Status  New    Target Date  07/29/18      PT SHORT TERM GOAL #2   Title  Patient (> 74 years old) will complete five times sit to stand test in < 15 seconds indicating an increased LE strength and improved balance.    Time  4    Period  Weeks    Status  New    Target Date  07/29/18        PT Long Term Goals - 04/25/20 1729      PT LONG TERM GOAL #1   Title  Patient will reduce timed up and go to <11 seconds to reduce fall risk and demonstrate improved transfer/gait ability.    Time  4    Period  Weeks    Status  New    Target Date  06/06/20      PT LONG TERM GOAL #2   Title  Patient will increase 10 meter walk test to >1.26m/s as to improve gait speed for better community ambulation and to reduce fall risk.    Time  4    Period  Weeks    Status  New    Target Date  06/06/20      PT LONG TERM GOAL #3   Title  Patient will reduce timed up and go to <11 seconds to reduce fall risk and demonstrate improved transfer/gait ability.    Time  4    Period  Weeks    Status  New    Target Date  06/06/20            Plan - 05/03/20 1625    Clinical Impression  Statement Patient has better stepping pattern with less  stopping and better posture. Patient has difficulty with finishing BIG and needs extra cuing to perform exercises with correct amplitude and speed. Patient has difficulty with turning his head and rotating trunk with weight shifting exercises.  Patient has fatigue with standing exercises and needs constant VC to have correct posture. Patient has loss of balance and needs UE support intermittently thorough out exercise. Patient has slowness of movement during rotation and beginning movements.   Rehab Potential  Good    PT Frequency  4x / week    PT Duration  4 weeks    PT Treatment/Interventions  Therapeutic exercise;Therapeutic activities;Balance training;Patient/family education;Manual techniques;Dry needling;Neuromuscular re-education    PT Next Visit Plan  manual therpay, stretching    Consulted and Agree with Plan of Care  Patient       Patient will benefit from skilled therapeutic intervention in order to improve the following deficits and impairments:  Abnormal gait, Difficulty walking, Decreased activity tolerance, Decreased strength, Impaired flexibility, Pain  Visit Diagnosis: Difficulty in walking, not elsewhere classified  Other abnormalities of gait and mobility  Pain of right lower extremity  Muscle weakness (generalized)     Problem List Patient Active Problem List   Diagnosis Date Noted  . Loose stools 03/16/2019  . Allergic rhinitis due to pollen 03/06/2019  . Atrophic vaginitis 11/25/2018  . Spondylolisthesis of lumbosacral region 10/07/2018  . Neuropathy 07/13/2018  . Leg cramps 05/13/2018  . Loss of memory 09/22/2017  . Parkinson's disease ([place]) 08/19/2017  . Prediabetes 07/26/2016  . Pure hypercholesterolemia 12/23/2014  . Anxiety 06/24/2014    Alanson Puls, PT DPT 05/03/2020, 4:26 PM  [place] MAIN [facility] SERVICES [address] [place], [place],  [address] Phone: [phone]   Fax:  [phone]  Name: Deborah Velazquez MRN: [id number] Date of Birth: 09-Feb-1946

## 2020-05-04 ENCOUNTER — Encounter: Payer: Self-pay | Admitting: Physical Therapy

## 2020-05-04 ENCOUNTER — Other Ambulatory Visit: Payer: Self-pay

## 2020-05-04 ENCOUNTER — Ambulatory Visit: Payer: PPO | Admitting: Physical Therapy

## 2020-05-04 DIAGNOSIS — R262 Difficulty in walking, not elsewhere classified: Secondary | ICD-10-CM

## 2020-05-04 DIAGNOSIS — M6281 Muscle weakness (generalized): Secondary | ICD-10-CM

## 2020-05-04 DIAGNOSIS — R2689 Other abnormalities of gait and mobility: Secondary | ICD-10-CM

## 2020-05-04 DIAGNOSIS — M79604 Pain in right leg: Secondary | ICD-10-CM

## 2020-05-04 NOTE — Therapy (Signed)
[place] MAIN [facility] SERVICES [address] [place], [place], [address] Phone: [phone]   Fax:  [phone]  Physical Therapy Treatment  Patient Details  Name: Deborah Velazquez MRN: [id number] Date of Birth: October 26, 1946 Referring Provider (PT): [place] Books   Encounter Date: 05/04/2020   PT End of Session - 05/04/20 1512    Visit Number 5    Number of Visits 17    Date for PT Re-Evaluation 09/29/18    PT Start Time 0315    PT Stop Time 0415    PT Time Calculation (min) 60 min    Equipment Utilized During Treatment Gait belt    Activity Tolerance Patient tolerated treatment well    Behavior During Therapy WFL for tasks assessed/performed           Past Medical History:  Diagnosis Date  . Anxiety   . Arthritis   . Hyperlipemia   . Irritable bowel syndrome   . Parkinson disease ([place])   . Restless leg syndrome   . RSD (reflex sympathetic dystrophy)    pt denies    Past Surgical History:  Procedure Laterality Date  . APPENDECTOMY    . BREAST BIOPSY Left 08/16/2013   neg  . CATARACT EXTRACTION Left   . COLONOSCOPY    . COLONOSCOPY WITH PROPOFOL N/A 06/20/2015   Procedure: COLONOSCOPY WITH PROPOFOL;  Surgeon: Lollie Sails, MD;  Location: [facility] ENDOSCOPY;  Service: Endoscopy;  Laterality: N/A;  . EYE SURGERY     Cataract in one eye  . FRACTURE SURGERY     wrist - right  . PARS PLANA VITRECTOMY Left 2010    There were no vitals filed for this visit.   Subjective Assessment - 05/04/20 1512    Subjective Patient is ready to begin her LSVT BIG exercises today.    Pertinent History Patient uses a spc for a year, she uses a RW for indoors. she has a rollator for outdoor use. She has not had any falls in the last 6 months.    Pain Onset More than a month ago               Treatment:   Patient seen for LSVT Daily Session Maximal Daily Exercises for facilitation/coordination of movement Sustained movements are designed to  rescale the amplitude of movement output for generalization to daily functional activities .Performed as follows for 1 set of 10 repetitions each multidirectional sustained movements  1) Floor to ceiling , cues to hold for 10 seconds,needs modeling for correct positions , VC to reach out further and to reach up to the ceiling 2) Side to side multidirectional Repetitive movements performed in sitting and are designed to provide retraining effort needed for sustained muscle activation in tasks , cues to lean fwd and hold position x 10 counts: Patient does not move fwd<>sidesitting<> to fwd and is not able to transition or stretch out leg very far 3) Step and reach forward , cues for good knee flex, cues to move UE and LE together, cues to rotate  arms up to pronate his forearms 4) Step and reach backwards , cues for BUE back, getting toe up and bending back knee 5) Step and reach sideways, cues to turn  head sideways, and turn head back to neutral, and to rotate arms and pronate forearms 6) Rock and reach forward/backward , cues to reach far fwd with UE's, patient not comfortable with feet apart , not rocking very far to get  a good weight shift, not able to raise  heel or raise  toes much 7) Rock and reach sideways, cues for twist and look behind , only rotates side ways, unable to twist around or turn to look behind   functional component task with supervision 5 reps and simulated activities for: 1. Sit to stand x 10   needs cues to begin with UE up for correct start position 2.raising her leg x 10 standing marching  3.putting on coat with punching into arm of coat 4 standing for 10 mins with balloon toss 5. Deferred due to time constraint - reaching above her head Patient performed with instruction, verbal cues, tactile cues of therapist: goal: increase tissue extensibility, promote proper posture, improve mobility  Pt educated throughout session about proper posture and technique with exercises.  Improved exercise technique, movement at target joints, use of target muscles after min to mod verbal, visual, tactile cues.                        PT Education - 05/04/20 1512    Education Details LSVT BIG    Person(s) Educated Patient    Methods Explanation    Comprehension Verbalized understanding;Verbal cues required;Tactile cues required;Need further instruction            PT Short Term Goals - 07/01/18 5053      PT SHORT TERM GOAL #1   Title Patient will be independent in home exercise program to improve strength/mobility for better functional independence with ADLs.    Time 4    Period Weeks    Status New    Target Date 07/29/18      PT SHORT TERM GOAL #2   Title Patient (> 76 years old) will complete five times sit to stand test in < 15 seconds indicating an increased LE strength and improved balance.    Time 4    Period Weeks    Status New    Target Date 07/29/18             PT Long Term Goals - 04/25/20 1729      PT LONG TERM GOAL #1   Title Patient will reduce timed up and go to <11 seconds to reduce fall risk and demonstrate improved transfer/gait ability.    Time 4    Period Weeks    Status New    Target Date 06/06/20      PT LONG TERM GOAL #2   Title Patient will increase 10 meter walk test to >1.27m/s as to improve gait speed for better community ambulation and to reduce fall risk.    Time 4    Period Weeks    Status New    Target Date 06/06/20      PT LONG TERM GOAL #3   Title Patient will reduce timed up and go to <11 seconds to reduce fall risk and demonstrate improved transfer/gait ability.    Time 4    Period Weeks    Status New    Target Date 06/06/20                 Plan - 05/04/20 1629    Clinical Impression Statement Patient has unsteady stepping with several exercises, but his motor control improves with practice.Patient is using a chair support for fwd step, side step exercise and bwd step exercise.  Patient needs occasional verbal cueing to improve posture and cueing to correctly perform exercises slowly, holding at  end of range to increase motor firing of desired muscle to encourage fatigue. Patients performance improves with practice and uses UE to help support and for balance.   Rehab Potential Good    PT Frequency 4x / week    PT Duration 4 weeks    PT Treatment/Interventions Therapeutic exercise;Therapeutic activities;Balance training;Patient/family education;Manual techniques;Dry needling;Neuromuscular re-education    PT Next Visit Plan manual therpay, stretching    Consulted and Agree with Plan of Care Patient           Patient will benefit from skilled therapeutic intervention in order to improve the following deficits and impairments:  Abnormal gait, Difficulty walking, Decreased activity tolerance, Decreased strength, Impaired flexibility, Pain  Visit Diagnosis: Difficulty in walking, not elsewhere classified  Other abnormalities of gait and mobility  Pain of right lower extremity  Muscle weakness (generalized)     Problem List Patient Active Problem List   Diagnosis Date Noted  . Loose stools 03/16/2019  . Allergic rhinitis due to pollen 03/06/2019  . Atrophic vaginitis 11/25/2018  . Spondylolisthesis of lumbosacral region 10/07/2018  . Neuropathy 07/13/2018  . Leg cramps 05/13/2018  . Loss of memory 09/22/2017  . Parkinson's disease ([place]) 08/19/2017  . Prediabetes 07/26/2016  . Pure hypercholesterolemia 12/23/2014  . Anxiety 06/24/2014    Alanson Puls, PT DPT 05/04/2020, 4:29 PM  [place] MAIN [facility] SERVICES [address] [place], [place], [address] Phone: [phone]   Fax:  [phone]  Name: Deborah Velazquez MRN: [id number] Date of Birth: Apr 10, 1946

## 2020-05-08 ENCOUNTER — Other Ambulatory Visit: Payer: Self-pay

## 2020-05-08 ENCOUNTER — Ambulatory Visit: Payer: PPO | Admitting: Physical Therapy

## 2020-05-08 ENCOUNTER — Encounter: Payer: Self-pay | Admitting: Physical Therapy

## 2020-05-08 DIAGNOSIS — R262 Difficulty in walking, not elsewhere classified: Secondary | ICD-10-CM

## 2020-05-08 DIAGNOSIS — M79604 Pain in right leg: Secondary | ICD-10-CM

## 2020-05-08 DIAGNOSIS — R2689 Other abnormalities of gait and mobility: Secondary | ICD-10-CM

## 2020-05-08 DIAGNOSIS — M6281 Muscle weakness (generalized): Secondary | ICD-10-CM

## 2020-05-08 NOTE — Therapy (Signed)
[place] MAIN [facility] SERVICES [address] [place], [place], [address] Phone: [phone]   Fax:  [phone]  Physical Therapy Treatment  Patient Details  Name: Deborah Velazquez MRN: [id number] Date of Birth: [birth year] Referring Provider (PT): shah, [place]   Encounter Date: 05/08/2020   PT End of Session - 05/08/20 1642    Visit Number 6    Number of Visits 17    Date for PT Re-Evaluation 09/29/18    PT Start Time 0317    PT Stop Time 0415    PT Time Calculation (min) 58 min    Equipment Utilized During Treatment Gait belt    Activity Tolerance Patient tolerated treatment well    Behavior During Therapy [facility] for tasks assessed/performed           Past Medical History:  Diagnosis Date  . Anxiety   . Arthritis   . Hyperlipemia   . Irritable bowel syndrome   . Parkinson disease ([place])   . Restless leg syndrome   . RSD (reflex sympathetic dystrophy)    pt denies    Past Surgical History:  Procedure Laterality Date  . APPENDECTOMY    . BREAST BIOPSY Left 08/16/2013   neg  . CATARACT EXTRACTION Left   . COLONOSCOPY    . COLONOSCOPY WITH PROPOFOL N/A 06/20/2015   Procedure: COLONOSCOPY WITH PROPOFOL;  Surgeon: Lollie Sails, MD;  Location: [facility] ENDOSCOPY;  Service: Endoscopy;  Laterality: N/A;  . EYE SURGERY     Cataract in one eye  . FRACTURE SURGERY     wrist - right  . PARS PLANA VITRECTOMY Left 2010    There were no vitals filed for this visit.   Subjective Assessment - 05/08/20 1642    Subjective Patient is ready to begin her LSVT BIG exercises today.    Pertinent History Patient uses a spc for a year, she uses a RW for indoors. she has a rollator for outdoor use. She has not had any falls in the last 6 months.    Currently in Pain? No/denies    Pain Score 0-No pain    Pain Onset More than a month ago            Treatment:   Patient seen for LSVT Daily Session Maximal Daily Exercises for  facilitation/coordination of movement Sustained movements are designed to rescale the amplitude of movement output for generalization to daily functional activities .Performed as follows for 1 set of 10 repetitions each multidirectional sustained movements  1) Floor to ceiling , cues to hold for 10 seconds,needs modeling for correct positions , VC to reach out further and to reach up to the ceiling 2) Side to side multidirectional Repetitive movements performed in sitting and are designed to provide retraining effort needed for sustained muscle activation in tasks , cues to lean fwd and hold position x 10 counts: Patient does not move fwd<>sidesitting<> to fwd and is not able to transition or stretch out leg very far 3) Step and reach forward , cues for good knee flex, cues to move UE and LE together, cues to rotate  arms up to pronate his forearms 4) Step and reach backwards , cues for BUE back, getting toe up and bending back knee 5) Step and reach sideways, cues to turn  head sideways, and turn head back to neutral, and to rotate arms and pronate forearms 6) Rock and reach forward/backward , cues to reach far fwd with UE's, patient not  comfortable with feet apart , not rocking very far to get a good weight shift, not able to raise  heel or raise  toes much 7) Rock and reach sideways, cues for twist and look behind , only rotates side ways, unable to twist around or turn to look behind   functional component task with supervision 5 reps and simulated activities for: 1. Sit to stand x 10   needs cues to begin with UE up for correct start position 2. high marching x 10 3.putting on coat with punching into arm of coat( did not bring it in) 4.standing tolerance and tapping balloon x 10 mins 5. Reaching overhead x 5  Patient performed with instruction, verbal cues, tactile cues of therapist: goal: increase tissue extensibility, promote proper posture, improve mobility  Pt educated throughout session  about proper posture and technique with exercises. Improved exercise technique, movement at target joints, use of target muscles after min to mod verbal, visual, tactile cues.                           PT Education - 05/08/20 1642    Education Details LSVT BIG    Person(s) Educated Patient    Methods Explanation    Comprehension Verbalized understanding;Returned demonstration;Verbal cues required;Tactile cues required;Need further instruction            PT Short Term Goals - 07/01/18 4656      PT SHORT TERM GOAL #1   Title Patient will be independent in home exercise program to improve strength/mobility for better functional independence with ADLs.    Time 4    Period Weeks    Status New    Target Date 07/29/18      PT SHORT TERM GOAL #2   Title Patient (> 65 years old) will complete five times sit to stand test in < 15 seconds indicating an increased LE strength and improved balance.    Time 4    Period Weeks    Status New    Target Date 07/29/18             PT Long Term Goals - 04/25/20 1729      PT LONG TERM GOAL #1   Title Patient will reduce timed up and go to <11 seconds to reduce fall risk and demonstrate improved transfer/gait ability.    Time 4    Period Weeks    Status New    Target Date 06/06/20      PT LONG TERM GOAL #2   Title Patient will increase 10 meter walk test to >1.80m/s as to improve gait speed for better community ambulation and to reduce fall risk.    Time 4    Period Weeks    Status New    Target Date 06/06/20      PT LONG TERM GOAL #3   Title Patient will reduce timed up and go to <11 seconds to reduce fall risk and demonstrate improved transfer/gait ability.    Time 4    Period Weeks    Status New    Target Date 06/06/20                 Plan - 05/08/20 1646    Clinical Impression Statement  Patient has trunk weakness and has difficulty standing up straight to perform exercises. Patient is able to catch  mistakes in technique with incorrect positions and is able remember the start and finish positions. Marland Kitchen  Patient is able to perform exercises with modeling and verbal correction and cueing to step in the correct position.  Patient need cueing for BIG arm swing, Patient needs cueing for BIG swing and adding dual cognitive loads and distracters.    Rehab Potential Good    PT Frequency 4x / week    PT Duration 4 weeks    PT Treatment/Interventions Therapeutic exercise;Therapeutic activities;Balance training;Patient/family education;Manual techniques;Dry needling;Neuromuscular re-education    PT Next Visit Plan manual therpay, stretching    Consulted and Agree with Plan of Care Patient           Patient will benefit from skilled therapeutic intervention in order to improve the following deficits and impairments:  Abnormal gait, Difficulty walking, Decreased activity tolerance, Decreased strength, Impaired flexibility, Pain  Visit Diagnosis: Other abnormalities of gait and mobility  Difficulty in walking, not elsewhere classified  Pain of right lower extremity  Muscle weakness (generalized)     Problem List Patient Active Problem List   Diagnosis Date Noted  . Loose stools 03/16/2019  . Allergic rhinitis due to pollen 03/06/2019  . Atrophic vaginitis 11/25/2018  . Spondylolisthesis of lumbosacral region 10/07/2018  . Neuropathy 07/13/2018  . Leg cramps 05/13/2018  . Loss of memory 09/22/2017  . Parkinson's disease ([place]) 08/19/2017  . Prediabetes 07/26/2016  . Pure hypercholesterolemia 12/23/2014  . Anxiety 06/24/2014    Alanson Puls, PT DPT 05/08/2020, 4:48 PM  [place] MAIN [facility] SERVICES [address] [place], [place], [address] Phone: [phone]   Fax:  [phone]  Name: Deborah Velazquez MRN: [id number] Date of Birth: 05-Apr-1946

## 2020-05-09 ENCOUNTER — Ambulatory Visit: Payer: PPO | Admitting: Physical Therapy

## 2020-05-09 ENCOUNTER — Other Ambulatory Visit: Payer: Self-pay

## 2020-05-09 ENCOUNTER — Encounter: Payer: Self-pay | Admitting: Physical Therapy

## 2020-05-09 DIAGNOSIS — R262 Difficulty in walking, not elsewhere classified: Secondary | ICD-10-CM | POA: Diagnosis not present

## 2020-05-09 DIAGNOSIS — R2689 Other abnormalities of gait and mobility: Secondary | ICD-10-CM

## 2020-05-09 DIAGNOSIS — M79604 Pain in right leg: Secondary | ICD-10-CM

## 2020-05-09 DIAGNOSIS — M6281 Muscle weakness (generalized): Secondary | ICD-10-CM

## 2020-05-09 NOTE — Therapy (Signed)
[place] MAIN [facility] SERVICES [address] [place], [place], [address] Phone: [phone]   Fax:  [phone]  Physical Therapy Treatment  Patient Details  Name: Deborah Velazquez MRN: [id number] Date of Birth: 28-Feb-1946 Referring Provider (PT): Jennings Books   Encounter Date: 05/09/2020   PT End of Session - 05/09/20 1653    Visit Number 7    Number of Visits 17    Date for PT Re-Evaluation 09/29/18    PT Start Time 0310    PT Stop Time 0415    PT Time Calculation (min) 65 min    Equipment Utilized During Treatment Gait belt    Activity Tolerance Patient tolerated treatment well    Behavior During Therapy WFL for tasks assessed/performed           Past Medical History:  Diagnosis Date  . Anxiety   . Arthritis   . Hyperlipemia   . Irritable bowel syndrome   . Parkinson disease ([place])   . Restless leg syndrome   . RSD (reflex sympathetic dystrophy)    pt denies    Past Surgical History:  Procedure Laterality Date  . APPENDECTOMY    . BREAST BIOPSY Left 08/16/2013   neg  . CATARACT EXTRACTION Left   . COLONOSCOPY    . COLONOSCOPY WITH PROPOFOL N/A 06/20/2015   Procedure: COLONOSCOPY WITH PROPOFOL;  Surgeon: Lollie Sails, MD;  Location: [facility] ENDOSCOPY;  Service: Endoscopy;  Laterality: N/A;  . EYE SURGERY     Cataract in one eye  . FRACTURE SURGERY     wrist - right  . PARS PLANA VITRECTOMY Left 2010    There were no vitals filed for this visit.   Subjective Assessment - 05/09/20 1652    Subjective Patient is ready to begin her LSVT BIG exercises today.    Pertinent History Patient uses a spc for a year, she uses a RW for indoors. she has a rollator for outdoor use. She has not had any falls in the last 6 months.    Currently in Pain? No/denies    Pain Score 0-No pain    Pain Onset More than a month ago              Treatment:   Patient seen for LSVT Daily Session Maximal Daily Exercises for  facilitation/coordination of movement Sustained movements are designed to rescale the amplitude of movement output for generalization to daily functional activities .Performed as follows for 1 set of 10 repetitions each multidirectional sustained movements  1) Floor to ceiling , cues to hold for 10 seconds,needs modeling for correct positions , VC to reach out further and to reach up to the ceiling 2) Side to side multidirectional Repetitive movements performed in sitting and are designed to provide retraining effort needed for sustained muscle activation in tasks , cues to lean fwd and hold position x 10 counts: Patient does not move fwd<>sidesitting<> to fwd and is not able to transition or stretch out leg very far 3) Step and reach forward , cues for good knee flex, cues to move UE and LE together, cues to rotate  arms up to pronate his forearms 4) Step and reach backwards , cues for BUE back, getting toe up and bending back knee 5) Step and reach sideways, cues to turn  head sideways, and turn head back to neutral, and to rotate arms and pronate forearms 6) Rock and reach forward/backward , cues to reach far fwd with UE's,  patient not comfortable with feet apart , not rocking very far to get a good weight shift, not able to raise  heel or raise  toes much 7) Rock and reach sideways, cues for twist and look behind , only rotates side ways, unable to twist around or turn to look behind   functional component task with supervision 5 reps and simulated activities for: 1. Sit to stand x 10   needs cues to begin with UE up for correct start position 2. standing and batting balloon x 10 mins 3.putting on coat with punching into arm of coat- patient did not bring her coat 4.marching high to get her foot over the tub x 10 BLE 5. Reaching above her head  x 5  Patient performed with instruction, verbal cues, tactile cues of therapist: goal: increase tissue extensibility, promote proper posture, improve  mobility  Pt educated throughout session about proper posture and technique with exercises. Improved exercise technique, movement at target joints, use of target muscles after min to mod verbal, visual, tactile cues.                         PT Education - 05/09/20 1653    Education Details LSVT BIG    Person(s) Educated Patient    Methods Explanation    Comprehension Verbalized understanding            PT Short Term Goals - 07/01/18 0839      PT SHORT TERM GOAL #1   Title Patient will be independent in home exercise program to improve strength/mobility for better functional independence with ADLs.    Time 4    Period Weeks    Status New    Target Date 07/29/18      PT SHORT TERM GOAL #2   Title Patient (> 25 years old) will complete five times sit to stand test in < 15 seconds indicating an increased LE strength and improved balance.    Time 4    Period Weeks    Status New    Target Date 07/29/18             PT Long Term Goals - 04/25/20 1729      PT LONG TERM GOAL #1   Title Patient will reduce timed up and go to <11 seconds to reduce fall risk and demonstrate improved transfer/gait ability.    Time 4    Period Weeks    Status New    Target Date 06/06/20      PT LONG TERM GOAL #2   Title Patient will increase 10 meter walk test to >1.52m/s as to improve gait speed for better community ambulation and to reduce fall risk.    Time 4    Period Weeks    Status New    Target Date 06/06/20      PT LONG TERM GOAL #3   Title Patient will reduce timed up and go to <11 seconds to reduce fall risk and demonstrate improved transfer/gait ability.    Time 4    Period Weeks    Status New    Target Date 06/06/20                 Plan - 05/09/20 1654    Clinical Impression Statement Patient needs cueing for BIG swing and BIG amplitude.  Patient needs cueing for correct BIG arm swing. Patient improves with practice. Patient needs cuing to  consistently swing her arms  and also swing reciprocally. Patient needs cuing to swing UE's reciprocally with weight shift exercise and with backward stepping and correct UE positioning.   Rehab Potential Good    PT Frequency 4x / week    PT Duration 4 weeks    PT Treatment/Interventions Therapeutic exercise;Therapeutic activities;Balance training;Patient/family education;Manual techniques;Dry needling;Neuromuscular re-education    PT Next Visit Plan manual therpay, stretching    Consulted and Agree with Plan of Care Patient           Patient will benefit from skilled therapeutic intervention in order to improve the following deficits and impairments:  Abnormal gait, Difficulty walking, Decreased activity tolerance, Decreased strength, Impaired flexibility, Pain  Visit Diagnosis: Other abnormalities of gait and mobility  Difficulty in walking, not elsewhere classified  Pain of right lower extremity  Muscle weakness (generalized)     Problem List Patient Active Problem List   Diagnosis Date Noted  . Loose stools 03/16/2019  . Allergic rhinitis due to pollen 03/06/2019  . Atrophic vaginitis 11/25/2018  . Spondylolisthesis of lumbosacral region 10/07/2018  . Neuropathy 07/13/2018  . Leg cramps 05/13/2018  . Loss of memory 09/22/2017  . Parkinson's disease ([place]) 08/19/2017  . Prediabetes 07/26/2016  . Pure hypercholesterolemia 12/23/2014  . Anxiety 06/24/2014    Alanson Puls, PT DPT 05/09/2020, 4:55 PM  [place] MAIN [facility] SERVICES [address] [place], [place], [address] Phone: [phone]   Fax:  [phone]  Name: Deborah Velazquez MRN: [id number] Date of Birth: 07-Apr-1946

## 2020-05-10 ENCOUNTER — Ambulatory Visit: Payer: PPO | Admitting: Physical Therapy

## 2020-05-10 ENCOUNTER — Encounter: Payer: Self-pay | Admitting: Physical Therapy

## 2020-05-10 ENCOUNTER — Other Ambulatory Visit: Payer: Self-pay

## 2020-05-10 DIAGNOSIS — M6281 Muscle weakness (generalized): Secondary | ICD-10-CM

## 2020-05-10 DIAGNOSIS — R262 Difficulty in walking, not elsewhere classified: Secondary | ICD-10-CM

## 2020-05-10 DIAGNOSIS — R2689 Other abnormalities of gait and mobility: Secondary | ICD-10-CM

## 2020-05-10 DIAGNOSIS — M79604 Pain in right leg: Secondary | ICD-10-CM

## 2020-05-10 NOTE — Therapy (Signed)
[place] MAIN [facility] SERVICES [address] [place], [place], [address] Phone: [phone]   Fax:  [phone]  Physical Therapy Treatment  Patient Details  Name: Deborah Velazquez MRN: [id number] Date of Birth: [birth year] Referring Provider (PT): shah, [place]   Encounter Date: 05/10/2020    Past Medical History:  Diagnosis Date  . Anxiety   . Arthritis   . Hyperlipemia   . Irritable bowel syndrome   . Parkinson disease ([place])   . Restless leg syndrome   . RSD (reflex sympathetic dystrophy)    pt denies    Past Surgical History:  Procedure Laterality Date  . APPENDECTOMY    . BREAST BIOPSY Left 08/16/2013   neg  . CATARACT EXTRACTION Left   . COLONOSCOPY    . COLONOSCOPY WITH PROPOFOL N/A 06/20/2015   Procedure: COLONOSCOPY WITH PROPOFOL;  Surgeon: Lollie Sails, MD;  Location: [facility] ENDOSCOPY;  Service: Endoscopy;  Laterality: N/A;  . EYE SURGERY     Cataract in one eye  . FRACTURE SURGERY     wrist - right  . PARS PLANA VITRECTOMY Left 2010    There were no vitals filed for this visit.   Subjective Assessment - 05/10/20 1637    Subjective Patient is ready to begin her LSVT BIG exercises today.    Pertinent History Patient uses a spc for a year, she uses a RW for indoors. she has a rollator for outdoor use. She has not had any falls in the last 6 months.    Currently in Pain? No/denies    Pain Score 0-No pain    Pain Onset More than a month ago              Treatment:   Patient seen for LSVT Daily Session Maximal Daily Exercises for facilitation/coordination of movement Sustained movements are designed to rescale the amplitude of movement output for generalization to daily functional activities .Performed as follows for 1 set of 10 repetitions each multidirectional sustained movements  1) Floor to ceiling , cues to hold for 10 seconds,needs modeling for correct positions , VC to reach out further and to reach up to the  ceiling 2) Side to side multidirectional Repetitive movements performed in sitting and are designed to provide retraining effort needed for sustained muscle activation in tasks , cues to lean fwd and hold position x 10 counts: Patient does not move fwd<>sidesitting<> to fwd and is not able to transition or stretch out leg very far 3) Step and reach forward , cues for good knee flex, cues to move UE and LE together, cues to rotate  arms up to pronate his forearms 4) Step and reach backwards , cues for BUE back, getting toe up and bending back knee 5) Step and reach sideways, cues to turn  head sideways, and turn head back to neutral, and to rotate arms and pronate forearms 6) Rock and reach forward/backward , cues to reach far fwd with UE's, patient not comfortable with feet apart , not rocking very far to get a good weight shift, not able to raise  heel or raise  toes much 7) Rock and reach sideways, cues for twist and look behind , only rotates side ways, unable to twist around or turn to look behind   functional component task with supervision 5 reps and simulated activities for: 1. Sit to stand x 10   needs cues to begin with UE up for correct start position 2.standing with balloon  toss x 10 mins 3.putting on coat with punching into arm of coat- did not bring the coat, unable to work on this 4.marching Big to get over the tub  5. Reaching high x 5 BUE  Patient performed with instruction, verbal cues, tactile cues of therapist: goal: increase tissue extensibility, promote proper posture, improve mobility  Pt educated throughout session about proper posture and technique with exercises. Improved exercise technique, movement at target joints, use of target muscles after min to mod verbal, visual, tactile cues.                     PT Education - 05/10/20 1637    Education Details LSVT BIG    Person(s) Educated Patient    Methods Explanation    Comprehension Verbalized  understanding            PT Short Term Goals - 07/01/18 0839      PT SHORT TERM GOAL #1   Title Patient will be independent in home exercise program to improve strength/mobility for better functional independence with ADLs.    Time 4    Period Weeks    Status New    Target Date 07/29/18      PT SHORT TERM GOAL #2   Title Patient (> 50 years old) will complete five times sit to stand test in < 15 seconds indicating an increased LE strength and improved balance.    Time 4    Period Weeks    Status New    Target Date 07/29/18             PT Long Term Goals - 04/25/20 1729      PT LONG TERM GOAL #1   Title Patient will reduce timed up and go to <11 seconds to reduce fall risk and demonstrate improved transfer/gait ability.    Time 4    Period Weeks    Status New    Target Date 06/06/20      PT LONG TERM GOAL #2   Title Patient will increase 10 meter walk test to >1.44m/s as to improve gait speed for better community ambulation and to reduce fall risk.    Time 4    Period Weeks    Status New    Target Date 06/06/20      PT LONG TERM GOAL #3   Title Patient will reduce timed up and go to <11 seconds to reduce fall risk and demonstrate improved transfer/gait ability.    Time 4    Period Weeks    Status New    Target Date 06/06/20                 Plan - 05/10/20 1707    Clinical Impression Statement Patient has unsteady stepping with several exercises, but his motor control improves with practice. Patient needs occasional verbal cueing to improve posture and cueing to correctly perform exercises slowly, holding at end of range to increase motor firing of desired muscle to encourage fatigue. Patients performance improves with practice and uses UE to help support and for balance.   Rehab Potential Good    PT Frequency 4x / week    PT Duration 4 weeks    PT Treatment/Interventions Therapeutic exercise;Therapeutic activities;Balance training;Patient/family  education;Manual techniques;Dry needling;Neuromuscular re-education    PT Next Visit Plan manual therpay, stretching    Consulted and Agree with Plan of Care Patient           Patient will benefit  from skilled therapeutic intervention in order to improve the following deficits and impairments:  Abnormal gait, Difficulty walking, Decreased activity tolerance, Decreased strength, Impaired flexibility, Pain  Visit Diagnosis: Difficulty in walking, not elsewhere classified  Other abnormalities of gait and mobility  Pain of right lower extremity  Muscle weakness (generalized)     Problem List Patient Active Problem List   Diagnosis Date Noted  . Loose stools 03/16/2019  . Allergic rhinitis due to pollen 03/06/2019  . Atrophic vaginitis 11/25/2018  . Spondylolisthesis of lumbosacral region 10/07/2018  . Neuropathy 07/13/2018  . Leg cramps 05/13/2018  . Loss of memory 09/22/2017  . Parkinson's disease ([place]) 08/19/2017  . Prediabetes 07/26/2016  . Pure hypercholesterolemia 12/23/2014  . Anxiety 06/24/2014    Alanson Puls, PT DPT 05/10/2020, 5:09 PM  [place] MAIN [facility] SERVICES [address] [place], [place], [address] Phone: [phone]   Fax:  [phone]  Name: Deborah Velazquez MRN: [id number] Date of Birth: Dec 11, 1945

## 2020-05-11 ENCOUNTER — Encounter: Payer: Self-pay | Admitting: Physical Therapy

## 2020-05-11 ENCOUNTER — Other Ambulatory Visit: Payer: Self-pay

## 2020-05-11 ENCOUNTER — Ambulatory Visit: Payer: PPO | Admitting: Physical Therapy

## 2020-05-11 DIAGNOSIS — M6281 Muscle weakness (generalized): Secondary | ICD-10-CM

## 2020-05-11 DIAGNOSIS — R2689 Other abnormalities of gait and mobility: Secondary | ICD-10-CM

## 2020-05-11 DIAGNOSIS — R262 Difficulty in walking, not elsewhere classified: Secondary | ICD-10-CM | POA: Diagnosis not present

## 2020-05-11 DIAGNOSIS — M79604 Pain in right leg: Secondary | ICD-10-CM

## 2020-05-11 NOTE — Therapy (Signed)
[place] MAIN [facility] SERVICES [address] [place], [place], [address] Phone: [phone]   Fax:  [phone]  Physical Therapy Treatment  Patient Details  Name: Deborah Velazquez MRN: [id number] Date of Birth: 01-12-1946 Referring Provider (PT): shah, [place]   Encounter Date: 05/11/2020   PT End of Session - 05/11/20 1513    Visit Number 9    Number of Visits 17    Date for PT Re-Evaluation 09/29/18    PT Start Time 0315    PT Stop Time 0415    PT Time Calculation (min) 60 min    Equipment Utilized During Treatment Gait belt    Activity Tolerance Patient tolerated treatment well    Behavior During Therapy WFL for tasks assessed/performed           Past Medical History:  Diagnosis Date  . Anxiety   . Arthritis   . Hyperlipemia   . Irritable bowel syndrome   . Parkinson disease ([place])   . Restless leg syndrome   . RSD (reflex sympathetic dystrophy)    pt denies    Past Surgical History:  Procedure Laterality Date  . APPENDECTOMY    . BREAST BIOPSY Left 08/16/2013   neg  . CATARACT EXTRACTION Left   . COLONOSCOPY    . COLONOSCOPY WITH PROPOFOL N/A 06/20/2015   Procedure: COLONOSCOPY WITH PROPOFOL;  Surgeon: Lollie Sails, MD;  Location: [facility] ENDOSCOPY;  Service: Endoscopy;  Laterality: N/A;  . EYE SURGERY     Cataract in one eye  . FRACTURE SURGERY     wrist - right  . PARS PLANA VITRECTOMY Left 2010    There were no vitals filed for this visit.   Subjective Assessment - 05/11/20 1512    Subjective Patient is ready to begin her LSVT BIG exercises today.    Pertinent History Patient uses a spc for a year, she uses a RW for indoors. she has a rollator for outdoor use. She has not had any falls in the last 6 months.    Currently in Pain? No/denies    Pain Score 0-No pain    Pain Onset More than a month ago    Multiple Pain Sites No            Treatment:   Patient seen for LSVT Daily Session Maximal Daily  Exercises for facilitation/coordination of movement Sustained movements are designed to rescale the amplitude of movement output for generalization to daily functional activities .Performed as follows for 1 set of 10 repetitions each multidirectional sustained movements  1) Floor to ceiling , cues to hold for 10 seconds,needs modeling for correct positions , VC to reach out further and to reach up to the ceiling 2) Side to side multidirectional Repetitive movements performed in sitting and are designed to provide retraining effort needed for sustained muscle activation in tasks , cues to lean fwd and hold position x 10 counts: Patient does not move fwd<>sidesitting<> to fwd and is not able to transition or stretch out leg very far 3) Step and reach forward , cues for good knee flex, cues to move UE and LE together, cues to rotate  arms up to pronate his forearms 4) Step and reach backwards , cues for BUE back, getting toe up and bending back knee 5) Step and reach sideways, cues to turn  head sideways, and turn head back to neutral, and to rotate arms and pronate forearms 6) Rock and reach forward/backward , cues to  reach far fwd with UE's, patient not comfortable with feet apart , not rocking very far to get a good weight shift, not able to raise  heel or raise  toes much 7) Rock and reach sideways, cues for twist and look behind , only rotates side ways, unable to twist around or turn to look behind   functional component task with supervision 5 reps and simulated activities for: 1. Sit to stand x 10   needs cues to begin with UE up for correct start position 2. standing up straight with balloon tapping x 10 mins 3.putting on coat with punching into arm of coat 4.marching up BLE x 5 5. Reaching above shoulder height x 5 Patient performed with instruction, verbal cues, tactile cues of therapist: goal: increase tissue extensibility, promote proper posture, improve mobility  Pt educated throughout  session about proper posture and technique with exercises. Improved exercise technique, movement at target joints, use of target muscles after min to mod verbal, visual, tactile cues.                          PT Education - 05/11/20 1512    Education Details LSVT BIG    Person(s) Educated Patient    Methods Explanation    Comprehension Verbalized understanding;Returned demonstration;Verbal cues required;Tactile cues required;Need further instruction            PT Short Term Goals - 07/01/18 3474      PT SHORT TERM GOAL #1   Title Patient will be independent in home exercise program to improve strength/mobility for better functional independence with ADLs.    Time 4    Period Weeks    Status New    Target Date 07/29/18      PT SHORT TERM GOAL #2   Title Patient (> 63 years old) will complete five times sit to stand test in < 15 seconds indicating an increased LE strength and improved balance.    Time 4    Period Weeks    Status New    Target Date 07/29/18             PT Long Term Goals - 04/25/20 1729      PT LONG TERM GOAL #1   Title Patient will reduce timed up and go to <11 seconds to reduce fall risk and demonstrate improved transfer/gait ability.    Time 4    Period Weeks    Status New    Target Date 06/06/20      PT LONG TERM GOAL #2   Title Patient will increase 10 meter walk test to >1.89m/s as to improve gait speed for better community ambulation and to reduce fall risk.    Time 4    Period Weeks    Status New    Target Date 06/06/20      PT LONG TERM GOAL #3   Title Patient will reduce timed up and go to <11 seconds to reduce fall risk and demonstrate improved transfer/gait ability.    Time 4    Period Weeks    Status New    Target Date 06/06/20                 Plan - 05/11/20 1515    Clinical Impression Statement Pt presents with slowness of movements and fatigues with therapeutic exercises. Patient is able to get out  of 19 inch chair 10 reps today for the first time. Patient needs  verbal, tactile and modeling cues with LSVT BIG exercises. Patient demonstrates difficulty with remembering to turn her head during standing exercises and the start and finish positions. Patient tolerated all interventions well this date and will benefit from continued skilled PT interventions to improve strength and balance and decrease risk of falling   Rehab Potential Good    PT Frequency 4x / week    PT Duration 4 weeks    PT Treatment/Interventions Therapeutic exercise;Therapeutic activities;Balance training;Patient/family education;Manual techniques;Dry needling;Neuromuscular re-education    PT Next Visit Plan manual therpay, stretching    Consulted and Agree with Plan of Care Patient           Patient will benefit from skilled therapeutic intervention in order to improve the following deficits and impairments:  Abnormal gait, Difficulty walking, Decreased activity tolerance, Decreased strength, Impaired flexibility, Pain  Visit Diagnosis: Difficulty in walking, not elsewhere classified  Other abnormalities of gait and mobility  Pain of right lower extremity  Muscle weakness (generalized)     Problem List Patient Active Problem List   Diagnosis Date Noted  . Loose stools 03/16/2019  . Allergic rhinitis due to pollen 03/06/2019  . Atrophic vaginitis 11/25/2018  . Spondylolisthesis of lumbosacral region 10/07/2018  . Neuropathy 07/13/2018  . Leg cramps 05/13/2018  . Loss of memory 09/22/2017  . Parkinson's disease ([place]) 08/19/2017  . Prediabetes 07/26/2016  . Pure hypercholesterolemia 12/23/2014  . Anxiety 06/24/2014    Alanson Puls, PT DPT 05/11/2020, 3:16 PM  [place] MAIN [facility] SERVICES [address] [place], [place], [address] Phone: [phone]   Fax:  [phone]  Name: Deborah Velazquez MRN: [id number] Date of Birth: 1946/08/10

## 2020-05-15 ENCOUNTER — Ambulatory Visit: Payer: PPO | Admitting: Physical Therapy

## 2020-05-15 ENCOUNTER — Encounter: Payer: Self-pay | Admitting: Physical Therapy

## 2020-05-15 ENCOUNTER — Other Ambulatory Visit: Payer: Self-pay

## 2020-05-15 DIAGNOSIS — M79604 Pain in right leg: Secondary | ICD-10-CM

## 2020-05-15 DIAGNOSIS — R262 Difficulty in walking, not elsewhere classified: Secondary | ICD-10-CM

## 2020-05-15 DIAGNOSIS — R2689 Other abnormalities of gait and mobility: Secondary | ICD-10-CM

## 2020-05-15 DIAGNOSIS — M6281 Muscle weakness (generalized): Secondary | ICD-10-CM

## 2020-05-15 NOTE — Therapy (Signed)
[place] MAIN [facility] SERVICES [address] [place], [place], [address] Phone: [phone]   Fax:  [phone]  Physical Therapy Treatment Physical Therapy Progress Note   Dates of reporting period  04/25/20   to  05/15/20  Patient Details  Name: Deborah Velazquez MRN: [id number] Date of Birth: [birth year] Referring Provider (PT): Jennings Books   Encounter Date: 05/15/2020   PT End of Session - 05/15/20 1626    Visit Number 10    Number of Visits 17    Date for PT Re-Evaluation 09/29/18    PT Start Time 0315    PT Stop Time 0415    PT Time Calculation (min) 60 min    Equipment Utilized During Treatment Gait belt    Activity Tolerance Patient tolerated treatment well    Behavior During Therapy WFL for tasks assessed/performed           Past Medical History:  Diagnosis Date  . Anxiety   . Arthritis   . Hyperlipemia   . Irritable bowel syndrome   . Parkinson disease ([place])   . Restless leg syndrome   . RSD (reflex sympathetic dystrophy)    pt denies    Past Surgical History:  Procedure Laterality Date  . APPENDECTOMY    . BREAST BIOPSY Left 08/16/2013   neg  . CATARACT EXTRACTION Left   . COLONOSCOPY    . COLONOSCOPY WITH PROPOFOL N/A 06/20/2015   Procedure: COLONOSCOPY WITH PROPOFOL;  Surgeon: Lollie Sails, MD;  Location: [facility] ENDOSCOPY;  Service: Endoscopy;  Laterality: N/A;  . EYE SURGERY     Cataract in one eye  . FRACTURE SURGERY     wrist - right  . PARS PLANA VITRECTOMY Left 2010    There were no vitals filed for this visit.   Subjective Assessment - 05/15/20 1625    Subjective Patient is ready to begin her LSVT BIG exercises today.    Pertinent History Patient uses a spc for a year, she uses a RW for indoors. she has a rollator for outdoor use. She has not had any falls in the last 6 months.    Currently in Pain? No/denies    Pain Score 0-No pain    Pain Onset More than a month ago            Treatment:    Patient seen for LSVT Daily Session Maximal Daily Exercises for facilitation/coordination of movement Sustained movements are designed to rescale the amplitude of movement output for generalization to daily functional activities .Performed as follows for 1 set of 10 repetitions each multidirectional sustained movements  1) Floor to ceiling , cues to hold for 10 seconds,needs modeling for correct positions , VC to reach out further and to reach up to the ceiling 2) Side to side multidirectional Repetitive movements performed in sitting and are designed to provide retraining effort needed for sustained muscle activation in tasks , cues to lean fwd and hold position x 10 counts: Patient does not move fwd<>sidesitting<> to fwd and is not able to transition or stretch out leg very far 3) Step and reach forward , cues for good knee flex, cues to move UE and LE together, cues to rotate  arms up to pronate his forearms 4) Step and reach backwards , cues for BUE back, getting toe up and bending back knee 5) Step and reach sideways, cues to turn  head sideways, and turn head back to neutral, and to rotate arms and  pronate forearms 6) Rock and reach forward/backward , cues to reach far fwd with UE's, patient not comfortable with feet apart , not rocking very far to get a good weight shift, not able to raise  heel or raise  toes much 7) Rock and reach sideways, cues for twist and look behind , only rotates side ways, unable to twist around or turn to look behind   functional component task with supervision 5 reps and simulated activities for: 1. Sit to stand x 10   needs cues to begin with UE up for correct start position 2.standing x 10 mins with balloon toss to encourage upright posture 3.putting on coat with punching into arm of coat 4.marching with big hip flex in standing x 5 BLE 5. Reaching up over her head  x 5  Patient performed with instruction, verbal cues, tactile cues of therapist: goal: increase  tissue extensibility, promote proper posture, improve mobility  Pt educated throughout session about proper posture and technique with exercises. Improved exercise technique, movement at target joints, use of target muscles after min to mod verbal, visual, tactile cues.                          PT Education - 05/15/20 1625    Education Details LSVT BIG    Person(s) Educated Patient    Methods Explanation    Comprehension Verbalized understanding;Returned demonstration;Verbal cues required;Tactile cues required;Need further instruction            PT Short Term Goals - 07/01/18 4259      PT SHORT TERM GOAL #1   Title Patient will be independent in home exercise program to improve strength/mobility for better functional independence with ADLs.    Time 4    Period Weeks    Status New    Target Date 07/29/18      PT SHORT TERM GOAL #2   Title Patient (> 49 years old) will complete five times sit to stand test in < 15 seconds indicating an increased LE strength and improved balance.    Time 4    Period Weeks    Status New    Target Date 07/29/18             PT Long Term Goals - 04/25/20 1729      PT LONG TERM GOAL #1   Title Patient will reduce timed up and go to <11 seconds to reduce fall risk and demonstrate improved transfer/gait ability.    Time 4    Period Weeks    Status New    Target Date 06/06/20      PT LONG TERM GOAL #2   Title Patient will increase 10 meter walk test to >1.74m/s as to improve gait speed for better community ambulation and to reduce fall risk.    Time 4    Period Weeks    Status New    Target Date 06/06/20      PT LONG TERM GOAL #3   Title Patient will reduce timed up and go to <11 seconds to reduce fall risk and demonstrate improved transfer/gait ability.    Time 4    Period Weeks    Status New    Target Date 06/06/20                 Plan - 05/15/20 1626    Clinical Impression Statement Patient's condition  has the potential to improve in response to therapy.  Maximum improvement is yet to be obtained. The anticipated improvement is attainable and reasonable in a generally predictable time.  Patient reports that she is doing her HEP every day and is able to move better in her home. Min cueing needed to appropriately perform LSVT tasks with leg, hand, and head position. Decreased coordination demonstrated requiring consistent verbal cueing to correct form. Cognitive understanding of task was delayed. Patient continues to demonstrate some in coordination of movement with select exercises such as rock and reach and stepping backwards. Patient responds well to verbal and tactile cues to correct form and technique.  CGA to SBA for safety with activities.  Uses to increase intensity and amplitude of movements throughout session.   Rehab Potential Good    PT Frequency 4x / week    PT Duration 4 weeks    PT Treatment/Interventions Therapeutic exercise;Therapeutic activities;Balance training;Patient/family education;Manual techniques;Dry needling;Neuromuscular re-education    PT Next Visit Plan manual therpay, stretching    Consulted and Agree with Plan of Care Patient           Patient will benefit from skilled therapeutic intervention in order to improve the following deficits and impairments:  Abnormal gait, Difficulty walking, Decreased activity tolerance, Decreased strength, Impaired flexibility, Pain  Visit Diagnosis: Other abnormalities of gait and mobility  Difficulty in walking, not elsewhere classified  Pain of right lower extremity  Muscle weakness (generalized)     Problem List Patient Active Problem List   Diagnosis Date Noted  . Loose stools 03/16/2019  . Allergic rhinitis due to pollen 03/06/2019  . Atrophic vaginitis 11/25/2018  . Spondylolisthesis of lumbosacral region 10/07/2018  . Neuropathy 07/13/2018  . Leg cramps 05/13/2018  . Loss of memory 09/22/2017  . Parkinson's  disease ([place]) 08/19/2017  . Prediabetes 07/26/2016  . Pure hypercholesterolemia 12/23/2014  . Anxiety 06/24/2014    Alanson Puls, PT DPT 05/15/2020, 4:28 PM  [place] MAIN [facility] SERVICES [address] [place], [place], [address] Phone: [phone]   Fax:  [phone]  Name: Deborah Velazquez MRN: [id number] Date of Birth: 1946-04-29

## 2020-05-16 ENCOUNTER — Other Ambulatory Visit: Payer: Self-pay

## 2020-05-16 ENCOUNTER — Encounter: Payer: Self-pay | Admitting: Physical Therapy

## 2020-05-16 ENCOUNTER — Ambulatory Visit: Payer: PPO | Admitting: Physical Therapy

## 2020-05-16 DIAGNOSIS — R2689 Other abnormalities of gait and mobility: Secondary | ICD-10-CM

## 2020-05-16 DIAGNOSIS — R262 Difficulty in walking, not elsewhere classified: Secondary | ICD-10-CM | POA: Diagnosis not present

## 2020-05-16 DIAGNOSIS — M79604 Pain in right leg: Secondary | ICD-10-CM

## 2020-05-16 DIAGNOSIS — M6281 Muscle weakness (generalized): Secondary | ICD-10-CM

## 2020-05-16 NOTE — Therapy (Signed)
[place] MAIN [facility] SERVICES [address] [place], [place], [address] Phone: [phone]   Fax:  [phone]  Physical Therapy Treatment  Patient Details  Name: Deborah Velazquez MRN: [id number] Date of Birth: January 22, 1946 Referring Provider (PT): shah, [place]   Encounter Date: 05/16/2020   PT End of Session - 05/16/20 1644    Visit Number 11    Number of Visits 17    Date for PT Re-Evaluation 09/29/18    PT Start Time 0320    PT Stop Time 0415    PT Time Calculation (min) 55 min    Equipment Utilized During Treatment Gait belt    Activity Tolerance Patient tolerated treatment well    Behavior During Therapy WFL for tasks assessed/performed           Past Medical History:  Diagnosis Date  . Anxiety   . Arthritis   . Hyperlipemia   . Irritable bowel syndrome   . Parkinson disease ([place])   . Restless leg syndrome   . RSD (reflex sympathetic dystrophy)    pt denies    Past Surgical History:  Procedure Laterality Date  . APPENDECTOMY    . BREAST BIOPSY Left 08/16/2013   neg  . CATARACT EXTRACTION Left   . COLONOSCOPY    . COLONOSCOPY WITH PROPOFOL N/A 06/20/2015   Procedure: COLONOSCOPY WITH PROPOFOL;  Surgeon: Lollie Sails, MD;  Location: [facility] ENDOSCOPY;  Service: Endoscopy;  Laterality: N/A;  . EYE SURGERY     Cataract in one eye  . FRACTURE SURGERY     wrist - right  . PARS PLANA VITRECTOMY Left 2010    There were no vitals filed for this visit.   Subjective Assessment - 05/16/20 1644    Subjective Patient is ready to begin her LSVT BIG exercises today.    Pertinent History Patient uses a spc for a year, she uses a RW for indoors. she has a rollator for outdoor use. She has not had any falls in the last 6 months.    Currently in Pain? No/denies    Pain Score 0-No pain    Pain Onset More than a month ago            Treatment:   Patient seen for LSVT Daily Session Maximal Daily Exercises for  facilitation/coordination of movement Sustained movements are designed to rescale the amplitude of movement output for generalization to daily functional activities .Performed as follows for 1 set of 10 repetitions each multidirectional sustained movements  1) Floor to ceiling , cues to hold for 10 seconds,needs modeling for correct positions , VC to reach out further and to reach up to the ceiling 2) Side to side multidirectional Repetitive movements performed in sitting and are designed to provide retraining effort needed for sustained muscle activation in tasks , cues to lean fwd and hold position x 10 counts: Patient does not move fwd<>sidesitting<> to fwd and is not able to transition or stretch out leg very far 3) Step and reach forward , cues for good knee flex, cues to move UE and LE together, cues to rotate  arms up to pronate his forearms 4) Step and reach backwards , cues for BUE back, getting toe up and bending back knee 5) Step and reach sideways, cues to turn  head sideways, and turn head back to neutral, and to rotate arms and pronate forearms 6) Rock and reach forward/backward , cues to reach far fwd with UE's, patient not  comfortable with feet apart , not rocking very far to get a good weight shift, not able to raise  heel or raise  toes much 7) Rock and reach sideways, cues for twist and look behind , only rotates side ways, unable to twist around or turn to look behind   functional component task with supervision 5 reps and simulated activities for: 1. Sit to stand x 10   needs cues to begin with UE up for correct start position 2.marching x 10 BLE 3.putting on coat with punching into arm of coat 4.standing up straight x 10 mins  5. Reaching up high  x 5   Big walking and big swing x 100 feet ; patient is too fatigued to continue with ambulation Patient performed with instruction, verbal cues, tactile cues of therapist: goal: increase tissue extensibility, promote proper posture,  improve mobility  Pt educated throughout session about proper posture and technique with exercises. Improved exercise technique, movement at target joints, use of target muscles after min to mod verbal, visual, tactile cues.                          PT Education - 05/16/20 1644    Education Details LSVT BIG    Person(s) Educated Patient    Methods Explanation    Comprehension Verbalized understanding;Returned demonstration;Tactile cues required;Verbal cues required;Need further instruction            PT Short Term Goals - 05/15/20 1632      PT SHORT TERM GOAL #1   Title Patient will be independent in home exercise program to improve strength/mobility for better functional independence with ADLs.    Time 4    Period Weeks    Status On-going    Target Date 07/29/18      PT SHORT TERM GOAL #2   Title Patient (> 69 years old) will complete five times sit to stand test in < 15 seconds indicating an increased LE strength and improved balance.    Time 4    Period Weeks    Status On-going    Target Date 07/29/18             PT Long Term Goals - 05/15/20 1632      PT LONG TERM GOAL #1   Title Patient will reduce timed up and go to <11 seconds to reduce fall risk and demonstrate improved transfer/gait ability.    Time 4    Period Weeks    Status On-going      PT LONG TERM GOAL #2   Title Patient will increase 10 meter walk test to >1.52m/s as to improve gait speed for better community ambulation and to reduce fall risk.    Time 4    Period Weeks    Status On-going      PT LONG TERM GOAL #3   Title Patient will reduce timed up and go to <11 seconds to reduce fall risk and demonstrate improved transfer/gait ability.    Time 4    Period Weeks    Status On-going                 Plan - 05/16/20 1645    Clinical Impression Statement Patient continues to demonstrates less incoordination of movement and balance loss with select exercises such as  rock and reach and stepping backwards. Patient responds well to verbal and tactile cues to correct form and technique. Patient is able to catch  mistakes in technique with incorrect positions and is able remember the start and finish positions. Motor control of LE much improved.  Muscle fatigue but no major pain complaints.   Rehab Potential Good    PT Frequency 4x / week    PT Duration 4 weeks    PT Treatment/Interventions Therapeutic exercise;Therapeutic activities;Balance training;Patient/family education;Manual techniques;Dry needling;Neuromuscular re-education    PT Next Visit Plan manual therpay, stretching    Consulted and Agree with Plan of Care Patient           Patient will benefit from skilled therapeutic intervention in order to improve the following deficits and impairments:  Abnormal gait, Difficulty walking, Decreased activity tolerance, Decreased strength, Impaired flexibility, Pain  Visit Diagnosis: Other abnormalities of gait and mobility  Difficulty in walking, not elsewhere classified  Pain of right lower extremity  Muscle weakness (generalized)     Problem List Patient Active Problem List   Diagnosis Date Noted  . Loose stools 03/16/2019  . Allergic rhinitis due to pollen 03/06/2019  . Atrophic vaginitis 11/25/2018  . Spondylolisthesis of lumbosacral region 10/07/2018  . Neuropathy 07/13/2018  . Leg cramps 05/13/2018  . Loss of memory 09/22/2017  . Parkinson's disease ([place]) 08/19/2017  . Prediabetes 07/26/2016  . Pure hypercholesterolemia 12/23/2014  . Anxiety 06/24/2014    Alanson Puls, PT DPT 05/16/2020, 4:46 PM  [place] MAIN [facility] SERVICES [address] [place], [place], [address] Phone: [phone]   Fax:  [phone]  Name: SHAMIRA TOUTANT MRN: [id number] Date of Birth: 10-Feb-1946

## 2020-05-17 ENCOUNTER — Other Ambulatory Visit: Payer: Self-pay

## 2020-05-17 ENCOUNTER — Encounter: Payer: Self-pay | Admitting: Physical Therapy

## 2020-05-17 ENCOUNTER — Ambulatory Visit: Payer: PPO | Admitting: Physical Therapy

## 2020-05-17 DIAGNOSIS — R262 Difficulty in walking, not elsewhere classified: Secondary | ICD-10-CM

## 2020-05-17 DIAGNOSIS — M79604 Pain in right leg: Secondary | ICD-10-CM

## 2020-05-17 DIAGNOSIS — M6281 Muscle weakness (generalized): Secondary | ICD-10-CM

## 2020-05-17 DIAGNOSIS — R2689 Other abnormalities of gait and mobility: Secondary | ICD-10-CM

## 2020-05-17 NOTE — Therapy (Signed)
[place] MAIN [facility] SERVICES [address] [place], [place], [address] Phone: [phone]   Fax:  [phone]  Physical Therapy Treatment  Patient Details  Name: Deborah Velazquez MRN: [id number] Date of Birth: 10-22-1946 Referring Provider (PT): shah, [place]   Encounter Date: 05/17/2020   PT End of Session - 05/17/20 1639    Visit Number 12    Number of Visits 17    Date for PT Re-Evaluation 09/29/18    PT Start Time 0315    PT Stop Time 0415    PT Time Calculation (min) 60 min    Equipment Utilized During Treatment Gait belt    Activity Tolerance Patient tolerated treatment well    Behavior During Therapy WFL for tasks assessed/performed           Past Medical History:  Diagnosis Date  . Anxiety   . Arthritis   . Hyperlipemia   . Irritable bowel syndrome   . Parkinson disease ([place])   . Restless leg syndrome   . RSD (reflex sympathetic dystrophy)    pt denies    Past Surgical History:  Procedure Laterality Date  . APPENDECTOMY    . BREAST BIOPSY Left 08/16/2013   neg  . CATARACT EXTRACTION Left   . COLONOSCOPY    . COLONOSCOPY WITH PROPOFOL N/A 06/20/2015   Procedure: COLONOSCOPY WITH PROPOFOL;  Surgeon: Lollie Sails, MD;  Location: [facility] ENDOSCOPY;  Service: Endoscopy;  Laterality: N/A;  . EYE SURGERY     Cataract in one eye  . FRACTURE SURGERY     wrist - right  . PARS PLANA VITRECTOMY Left 2010    There were no vitals filed for this visit.   Subjective Assessment - 05/17/20 1638    Subjective Patient is ready to begin her LSVT BIG exercises today.    Pertinent History Patient uses a spc for a year, she uses a RW for indoors. she has a rollator for outdoor use. She has not had any falls in the last 6 months.    Currently in Pain? No/denies    Pain Score 0-No pain    Pain Onset More than a month ago           Treatment:   Patient seen for LSVT Daily Session Maximal Daily Exercises for  facilitation/coordination of movement Sustained movements are designed to rescale the amplitude of movement output for generalization to daily functional activities .Performed as follows for 1 set of 10 repetitions each multidirectional sustained movements  1) Floor to ceiling , cues to hold for 10 seconds,needs modeling for correct positions , VC to reach out further and to reach up to the ceiling 2) Side to side multidirectional Repetitive movements performed in sitting and are designed to provide retraining effort needed for sustained muscle activation in tasks , cues to lean fwd and hold position x 10 counts: Patient does not move fwd<>sidesitting<> to fwd and is not able to transition or stretch out leg very far 3) Step and reach forward , cues for good knee flex, cues to move UE and LE together, cues to rotate  arms up to pronate his forearms 4) Step and reach backwards , cues for BUE back, getting toe up and bending back knee 5) Step and reach sideways, cues to turn  head sideways, and turn head back to neutral, and to rotate arms and pronate forearms 6) Rock and reach forward/backward , cues to reach far fwd with UE's, patient not comfortable  with feet apart , not rocking very far to get a good weight shift, not able to raise  heel or raise  toes much 7) Rock and reach sideways, cues for twist and look behind , only rotates side ways, unable to twist around or turn to look behind   functional component task with supervision 5 reps and simulated activities for: 1. Sit to stand x 10   needs cues to begin with UE up for correct start position  Patient performed with instruction, verbal cues, tactile cues of therapist: goal: increase tissue extensibility, promote proper posture, improve mobility  Pt educated throughout session about proper posture and technique with exercises. Improved exercise technique, movement at target joints, use of target muscles after min to mod verbal, visual, tactile  cues.              OUTCOME MEASURES: evaluation results  TEST Outcome Interpretation  5 times sit<>stand 24.05sec >83 yo, >15 sec indicates increased risk for falls  10 meter walk test       .48          m/s <1.0 m/s indicates increased risk for falls; limited community ambulator  Timed up and Go     23.44            sec <14 sec indicates increased risk for falls  6 minute walk test    385            Feet 1000 feet is community ambulator      [address] Peg Test L:    36.47            R: 36.25       OUTCOME MEASURES: Progress  Note results TEST Outcome Interpretation  5 times sit<>stand 15.03sec >19 yo, >15 sec indicates increased risk for falls  10 meter walk test       .72       m/s <1.0 m/s indicates increased risk for falls; limited community ambulator  Timed up and Go     16.78         sec <14 sec indicates increased risk for falls  6 minute walk test    385            Feet 1000 feet is community ambulator      [address] Peg Test L: 31.28           R: 33.02                 PT Education - 05/17/20 1639    Education Details progress with LSVT BIG    Person(s) Educated Patient    Methods Explanation    Comprehension Verbalized understanding;Returned demonstration;Verbal cues required;Tactile cues required;Need further instruction            PT Short Term Goals - 05/15/20 1632      PT SHORT TERM GOAL #1   Title Patient will be independent in home exercise program to improve strength/mobility for better functional independence with ADLs.    Time 4    Period Weeks    Status On-going    Target Date 07/29/18      PT SHORT TERM GOAL #2   Title Patient (> 12 years old) will complete five times sit to stand test in < 15 seconds indicating an increased LE strength and improved balance.    Time 4    Period Weeks    Status On-going    Target Date 07/29/18  PT Long Term Goals - 05/17/20 1641      PT LONG TERM GOAL #1   Title Patient will  reduce timed up and go to <11 seconds to reduce fall risk and demonstrate improved transfer/gait ability.    Time 4    Period Weeks    Status On-going    Target Date 06/06/20      PT LONG TERM GOAL #2   Title Patient will increase 10 meter walk test to >1.72m/s as to improve gait speed for better community ambulation and to reduce fall risk.    Time 4    Period Weeks    Status On-going    Target Date 06/06/20      PT LONG TERM GOAL #3   Title Patient will reduce timed up and go to <11 seconds to reduce fall risk and demonstrate improved transfer/gait ability.    Time 4    Period Weeks    Status On-going    Target Date 06/06/20                 Plan - 05/17/20 1639    Clinical Impression Statement Patient continues to demonstrates less incoordination of movement with select exercises such as rock and reach and stepping backwards. Patient responds well to verbal and tactile cues to correct form and technique. Patient is able to catch mistakes in technique with incorrect positions and is able remember the start and finish positions. Motor control of LE much improved.  Muscle fatigue but no major pain complaints.   Rehab Potential Good    PT Frequency 4x / week    PT Duration 4 weeks    PT Treatment/Interventions Therapeutic exercise;Therapeutic activities;Balance training;Patient/family education;Manual techniques;Dry needling;Neuromuscular re-education    PT Next Visit Plan manual therpay, stretching    Consulted and Agree with Plan of Care Patient           Patient will benefit from skilled therapeutic intervention in order to improve the following deficits and impairments:  Abnormal gait, Difficulty walking, Decreased activity tolerance, Decreased strength, Impaired flexibility, Pain  Visit Diagnosis: Difficulty in walking, not elsewhere classified  Other abnormalities of gait and mobility  Pain of right lower extremity  Muscle weakness (generalized)     Problem  List Patient Active Problem List   Diagnosis Date Noted  . Loose stools 03/16/2019  . Allergic rhinitis due to pollen 03/06/2019  . Atrophic vaginitis 11/25/2018  . Spondylolisthesis of lumbosacral region 10/07/2018  . Neuropathy 07/13/2018  . Leg cramps 05/13/2018  . Loss of memory 09/22/2017  . Parkinson's disease ([place]) 08/19/2017  . Prediabetes 07/26/2016  . Pure hypercholesterolemia 12/23/2014  . Anxiety 06/24/2014    Alanson Puls, PT DPT 05/17/2020, 4:45 PM  [place] MAIN [facility] SERVICES [address] [place], [place], [address] Phone: [phone]   Fax:  [phone]  Name: Deborah Velazquez MRN: [id number] Date of Birth: 1946-07-22

## 2020-05-18 ENCOUNTER — Other Ambulatory Visit: Payer: Self-pay

## 2020-05-18 ENCOUNTER — Ambulatory Visit: Payer: PPO | Admitting: Physical Therapy

## 2020-05-18 ENCOUNTER — Encounter: Payer: Self-pay | Admitting: Physical Therapy

## 2020-05-18 DIAGNOSIS — R262 Difficulty in walking, not elsewhere classified: Secondary | ICD-10-CM

## 2020-05-18 DIAGNOSIS — M79604 Pain in right leg: Secondary | ICD-10-CM

## 2020-05-18 DIAGNOSIS — R2689 Other abnormalities of gait and mobility: Secondary | ICD-10-CM

## 2020-05-18 DIAGNOSIS — M6281 Muscle weakness (generalized): Secondary | ICD-10-CM

## 2020-05-18 NOTE — Therapy (Signed)
[place] MAIN [facility] SERVICES [address] [place], [place], [address] Phone: [phone]   Fax:  [phone]  Physical Therapy Treatment  Patient Details  Name: Deborah Velazquez MRN: [id number] Date of Birth: November 05, 1946 Referring Provider (PT): Jennings Books   Encounter Date: 05/18/2020   PT End of Session - 05/18/20 1629    Visit Number 13    Number of Visits 17    Date for PT Re-Evaluation 09/29/18    PT Start Time 0315    PT Stop Time 0410    PT Time Calculation (min) 55 min    Equipment Utilized During Treatment Gait belt    Activity Tolerance Patient tolerated treatment well    Behavior During Therapy WFL for tasks assessed/performed           Past Medical History:  Diagnosis Date  . Anxiety   . Arthritis   . Hyperlipemia   . Irritable bowel syndrome   . Parkinson disease ([place])   . Restless leg syndrome   . RSD (reflex sympathetic dystrophy)    pt denies    Past Surgical History:  Procedure Laterality Date  . APPENDECTOMY    . BREAST BIOPSY Left 08/16/2013   neg  . CATARACT EXTRACTION Left   . COLONOSCOPY    . COLONOSCOPY WITH PROPOFOL N/A 06/20/2015   Procedure: COLONOSCOPY WITH PROPOFOL;  Surgeon: Lollie Sails, MD;  Location: [facility] ENDOSCOPY;  Service: Endoscopy;  Laterality: N/A;  . EYE SURGERY     Cataract in one eye  . FRACTURE SURGERY     wrist - right  . PARS PLANA VITRECTOMY Left 2010    There were no vitals filed for this visit.   Subjective Assessment - 05/18/20 1629    Subjective Patient is ready to begin her LSVT BIG exercises today.    Pertinent History Patient uses a spc for a year, she uses a RW for indoors. she has a rollator for outdoor use. She has not had any falls in the last 6 months.    Currently in Pain? No/denies    Pain Score 0-No pain    Pain Onset More than a month ago            Treatment:   Patient seen for LSVT Daily Session Maximal Daily Exercises for  facilitation/coordination of movement Sustained movements are designed to rescale the amplitude of movement output for generalization to daily functional activities .Performed as follows for 1 set of 10 repetitions each multidirectional sustained movements  1) Floor to ceiling , cues to hold for 10 seconds,needs modeling for correct positions , VC to reach out further and to reach up to the ceiling 2) Side to side multidirectional Repetitive movements performed in sitting and are designed to provide retraining effort needed for sustained muscle activation in tasks , cues to lean fwd and hold position x 10 counts: Patient does not move fwd<>sidesitting<> to fwd and is not able to transition or stretch out leg very far 3) Step and reach forward , cues for good knee flex, cues to move UE and LE together, cues to rotate  arms up to pronate his forearms 4) Step and reach backwards , cues for BUE back, getting toe up and bending back knee 5) Step and reach sideways, cues to turn  head sideways, and turn head back to neutral, and to rotate arms and pronate forearms 6) Rock and reach forward/backward , cues to reach far fwd with UE's, patient not  comfortable with feet apart , not rocking very far to get a good weight shift, not able to raise  heel or raise  toes much 7) Rock and reach sideways, cues for twist and look behind , only rotates side ways, unable to twist around or turn to look behind   functional component task with supervision 5 reps and simulated activities for: 1. Sit to stand x 10   needs cues to begin with UE up for correct start position 2. standing up  Straight x 10 mins  3.putting on coat with punching into arm of coat 4.marching high x 10 BLE 5. Reaching up over head x 5  Patient performed with instruction, verbal cues, tactile cues of therapist: goal: increase tissue extensibility, promote proper posture, improve mobility  Pt educated throughout session about proper posture and  technique with exercises. Improved exercise technique, movement at target joints, use of target muscles after min to mod verbal, visual, tactile cues.                          PT Education - 05/18/20 1629    Education Details HEP    Person(s) Educated Patient    Methods Explanation    Comprehension Verbalized understanding;Returned demonstration;Verbal cues required;Need further instruction            PT Short Term Goals - 05/15/20 1632      PT SHORT TERM GOAL #1   Title Patient will be independent in home exercise program to improve strength/mobility for better functional independence with ADLs.    Time 4    Period Weeks    Status On-going    Target Date 07/29/18      PT SHORT TERM GOAL #2   Title Patient (> 5 years old) will complete five times sit to stand test in < 15 seconds indicating an increased LE strength and improved balance.    Time 4    Period Weeks    Status On-going    Target Date 07/29/18             PT Long Term Goals - 05/17/20 1641      PT LONG TERM GOAL #1   Title Patient will reduce timed up and go to <11 seconds to reduce fall risk and demonstrate improved transfer/gait ability.    Time 4    Period Weeks    Status On-going    Target Date 06/06/20      PT LONG TERM GOAL #2   Title Patient will increase 10 meter walk test to >1.66m/s as to improve gait speed for better community ambulation and to reduce fall risk.    Time 4    Period Weeks    Status On-going    Target Date 06/06/20      PT LONG TERM GOAL #3   Title Patient will reduce timed up and go to <11 seconds to reduce fall risk and demonstrate improved transfer/gait ability.    Time 4    Period Weeks    Status On-going    Target Date 06/06/20                 Plan - 05/18/20 1630    Clinical Impression Statement Patient has unsteady stepping with several exercises, but his motor control improves with practice. Patient needs occasional verbal cueing to  improve posture and cueing to correctly perform exercises slowly, holding at end of range to increase motor firing of desired  muscle to encourage fatigue. Patients performance improves with practice and uses UE to help support and for balance.   Rehab Potential Good    PT Frequency 4x / week    PT Duration 4 weeks    PT Treatment/Interventions Therapeutic exercise;Therapeutic activities;Balance training;Patient/family education;Manual techniques;Dry needling;Neuromuscular re-education    PT Next Visit Plan manual therpay, stretching    Consulted and Agree with Plan of Care Patient           Patient will benefit from skilled therapeutic intervention in order to improve the following deficits and impairments:  Abnormal gait, Difficulty walking, Decreased activity tolerance, Decreased strength, Impaired flexibility, Pain  Visit Diagnosis: Other abnormalities of gait and mobility  Difficulty in walking, not elsewhere classified  Muscle weakness (generalized)  Pain of right lower extremity     Problem List Patient Active Problem List   Diagnosis Date Noted  . Loose stools 03/16/2019  . Allergic rhinitis due to pollen 03/06/2019  . Atrophic vaginitis 11/25/2018  . Spondylolisthesis of lumbosacral region 10/07/2018  . Neuropathy 07/13/2018  . Leg cramps 05/13/2018  . Loss of memory 09/22/2017  . Parkinson's disease ([place]) 08/19/2017  . Prediabetes 07/26/2016  . Pure hypercholesterolemia 12/23/2014  . Anxiety 06/24/2014    Alanson Puls, PT DPT 05/18/2020, 4:31 PM  [place] MAIN [facility] SERVICES [address] [place], [place], [address] Phone: [phone]   Fax:  [phone]  Name: Deborah Velazquez MRN: [id number] Date of Birth: February 19, 1946

## 2020-05-19 DIAGNOSIS — H2511 Age-related nuclear cataract, right eye: Secondary | ICD-10-CM | POA: Diagnosis not present

## 2020-05-19 DIAGNOSIS — E7800 Pure hypercholesterolemia, unspecified: Secondary | ICD-10-CM | POA: Diagnosis not present

## 2020-05-22 ENCOUNTER — Encounter: Payer: Self-pay | Admitting: Physical Therapy

## 2020-05-22 ENCOUNTER — Ambulatory Visit: Payer: PPO | Admitting: Physical Therapy

## 2020-05-22 ENCOUNTER — Other Ambulatory Visit: Payer: Self-pay

## 2020-05-22 DIAGNOSIS — M79604 Pain in right leg: Secondary | ICD-10-CM

## 2020-05-22 DIAGNOSIS — R2689 Other abnormalities of gait and mobility: Secondary | ICD-10-CM

## 2020-05-22 DIAGNOSIS — M6281 Muscle weakness (generalized): Secondary | ICD-10-CM

## 2020-05-22 DIAGNOSIS — R262 Difficulty in walking, not elsewhere classified: Secondary | ICD-10-CM

## 2020-05-22 NOTE — Therapy (Signed)
[place] MAIN [facility] SERVICES [address] [place], [place], [address] Phone: [phone]   Fax:  [phone]  Physical Therapy Treatment  Patient Details  Name: GIANNA CALEF MRN: [id number] Date of Birth: [birth year] Referring Provider (PT): shah, [place]   Encounter Date: 05/22/2020   PT End of Session - 05/22/20 1637    Visit Number 14    Number of Visits 17    Date for PT Re-Evaluation 09/29/18    PT Start Time 0315    PT Stop Time 0410    PT Time Calculation (min) 55 min    Equipment Utilized During Treatment Gait belt    Activity Tolerance Patient tolerated treatment well    Behavior During Therapy WFL for tasks assessed/performed           Past Medical History:  Diagnosis Date  . Anxiety   . Arthritis   . Hyperlipemia   . Irritable bowel syndrome   . Parkinson disease ([place])   . Restless leg syndrome   . RSD (reflex sympathetic dystrophy)    pt denies    Past Surgical History:  Procedure Laterality Date  . APPENDECTOMY    . BREAST BIOPSY Left 08/16/2013   neg  . CATARACT EXTRACTION Left   . COLONOSCOPY    . COLONOSCOPY WITH PROPOFOL N/A 06/20/2015   Procedure: COLONOSCOPY WITH PROPOFOL;  Surgeon: Lollie Sails, MD;  Location: [facility] ENDOSCOPY;  Service: Endoscopy;  Laterality: N/A;  . EYE SURGERY     Cataract in one eye  . FRACTURE SURGERY     wrist - right  . PARS PLANA VITRECTOMY Left 2010    There were no vitals filed for this visit.   Subjective Assessment - 05/22/20 1636    Subjective Patient is ready to begin her LSVT BIG exercises today. She is getting up out of the chairs at home better.    Pertinent History Patient uses a spc for a year, she uses a RW for indoors. she has a rollator for outdoor use. She has not had any falls in the last 6 months.    Currently in Pain? No/denies    Pain Score 0-No pain    Pain Onset More than a month ago            Treatment:   Patient seen for LSVT Daily  Session Maximal Daily Exercises for facilitation/coordination of movement Sustained movements are designed to rescale the amplitude of movement output for generalization to daily functional activities .Performed as follows for 1 set of 10 repetitions each multidirectional sustained movements  1) Floor to ceiling , cues to hold for 10 seconds,needs modeling for correct positions , VC to reach out further and to reach up to the ceiling 2) Side to side multidirectional Repetitive movements performed in sitting and are designed to provide retraining effort needed for sustained muscle activation in tasks , cues to lean fwd and hold position x 10 counts: Patient does not move fwd<>sidesitting<> to fwd and is not able to transition or stretch out leg very far 3) Step and reach forward , cues for good knee flex, cues to move UE and LE together, cues to rotate  arms up to pronate his forearms 4) Step and reach backwards , cues for BUE back, getting toe up and bending back knee 5) Step and reach sideways, cues to turn  head sideways, and turn head back to neutral, and to rotate arms and pronate forearms 6) Rock and reach  forward/backward , cues to reach far fwd with UE's, patient not comfortable with feet apart , not rocking very far to get a good weight shift, not able to raise  heel or raise  toes much 7) Rock and reach sideways, cues for twist and look behind , only rotates side ways, unable to twist around or turn to look behind   functional component task with supervision 5 reps and simulated activities for: 1. Sit to stand x 10   needs cues to begin with UE up for correct start position 2. marching standing x 10  BLE 3.putting on coat with punching into arm of coat 4.standing and baloon tapping x 10 mins 5. Reaching high with BUE x 5  Patient performed with instruction, verbal cues, tactile cues of therapist: goal: increase tissue extensibility, promote proper posture, improve mobility  Pt educated  throughout session about proper posture and technique with exercises. Improved exercise technique, movement at target joints, use of target muscles after min to mod verbal, visual, tactile cues.                          PT Education - 05/22/20 1636    Education Details HEP    Person(s) Educated Patient    Methods Explanation    Comprehension Returned demonstration;Verbal cues required;Tactile cues required;Need further instruction            PT Short Term Goals - 05/15/20 1632      PT SHORT TERM GOAL #1   Title Patient will be independent in home exercise program to improve strength/mobility for better functional independence with ADLs.    Time 4    Period Weeks    Status On-going    Target Date 07/29/18      PT SHORT TERM GOAL #2   Title Patient (> 76 years old) will complete five times sit to stand test in < 15 seconds indicating an increased LE strength and improved balance.    Time 4    Period Weeks    Status On-going    Target Date 07/29/18             PT Long Term Goals - 05/17/20 1641      PT LONG TERM GOAL #1   Title Patient will reduce timed up and go to <11 seconds to reduce fall risk and demonstrate improved transfer/gait ability.    Time 4    Period Weeks    Status On-going    Target Date 06/06/20      PT LONG TERM GOAL #2   Title Patient will increase 10 meter walk test to >1.52m/s as to improve gait speed for better community ambulation and to reduce fall risk.    Time 4    Period Weeks    Status On-going    Target Date 06/06/20      PT LONG TERM GOAL #3   Title Patient will reduce timed up and go to <11 seconds to reduce fall risk and demonstrate improved transfer/gait ability.    Time 4    Period Weeks    Status On-going    Target Date 06/06/20                 Plan - 05/22/20 1637    Clinical Impression Statement Continues to have balance deficits typical with diagnosis. Patient performs intermediate level  exercises without pain behaviors and needs verbal cuing for postural alignment and head positioning. Cuing is  needed to stretch the leg out in seated side reaching. Fatigue with sit to stand but demonstrating more control .    Rehab Potential Good    PT Frequency 4x / week    PT Duration 4 weeks    PT Treatment/Interventions Therapeutic exercise;Therapeutic activities;Balance training;Patient/family education;Manual techniques;Dry needling;Neuromuscular re-education    PT Next Visit Plan manual therpay, stretching    Consulted and Agree with Plan of Care Patient           Patient will benefit from skilled therapeutic intervention in order to improve the following deficits and impairments:  Abnormal gait, Difficulty walking, Decreased activity tolerance, Decreased strength, Impaired flexibility, Pain  Visit Diagnosis: Difficulty in walking, not elsewhere classified  Other abnormalities of gait and mobility  Muscle weakness (generalized)  Pain of right lower extremity     Problem List Patient Active Problem List   Diagnosis Date Noted  . Loose stools 03/16/2019  . Allergic rhinitis due to pollen 03/06/2019  . Atrophic vaginitis 11/25/2018  . Spondylolisthesis of lumbosacral region 10/07/2018  . Neuropathy 07/13/2018  . Leg cramps 05/13/2018  . Loss of memory 09/22/2017  . Parkinson's disease ([place]) 08/19/2017  . Prediabetes 07/26/2016  . Pure hypercholesterolemia 12/23/2014  . Anxiety 06/24/2014    Alanson Puls, PT DPT 05/22/2020, 4:41 PM  [place] MAIN [facility] SERVICES [address] [place], [place], [address] Phone: [phone]   Fax:  [phone]  Name: LUANE ROCHON MRN: [id number] Date of Birth: 27-Dec-1945

## 2020-05-23 ENCOUNTER — Encounter: Payer: Self-pay | Admitting: Ophthalmology

## 2020-05-23 ENCOUNTER — Ambulatory Visit: Payer: PPO | Admitting: Physical Therapy

## 2020-05-23 ENCOUNTER — Other Ambulatory Visit: Payer: Self-pay

## 2020-05-23 ENCOUNTER — Encounter: Payer: Self-pay | Admitting: Physical Therapy

## 2020-05-23 DIAGNOSIS — R2689 Other abnormalities of gait and mobility: Secondary | ICD-10-CM

## 2020-05-23 DIAGNOSIS — R262 Difficulty in walking, not elsewhere classified: Secondary | ICD-10-CM | POA: Diagnosis not present

## 2020-05-23 DIAGNOSIS — M6281 Muscle weakness (generalized): Secondary | ICD-10-CM

## 2020-05-23 DIAGNOSIS — M79604 Pain in right leg: Secondary | ICD-10-CM

## 2020-05-23 NOTE — Therapy (Signed)
[place] MAIN [facility] SERVICES [address] [place], [place], [address] Phone: [phone]   Fax:  [phone]  Physical Therapy Treatment  Patient Details  Name: Deborah Velazquez MRN: [id number] Date of Birth: 1946-08-01 Referring Provider (PT): Jennings Books   Encounter Date: 05/23/2020   PT End of Session - 05/23/20 1621    Visit Number 15    Number of Visits 17    Date for PT Re-Evaluation 09/29/18    PT Start Time 0315    PT Stop Time 0400    PT Time Calculation (min) 45 min    Equipment Utilized During Treatment Gait belt    Activity Tolerance Patient tolerated treatment well    Behavior During Therapy WFL for tasks assessed/performed           Past Medical History:  Diagnosis Date  . Anxiety   . Arthritis   . Hyperlipemia   . Irritable bowel syndrome   . Parkinson disease ([place])   . Restless leg syndrome   . RSD (reflex sympathetic dystrophy)    pt denies    Past Surgical History:  Procedure Laterality Date  . APPENDECTOMY    . BREAST BIOPSY Left 08/16/2013   neg  . CATARACT EXTRACTION Left   . COLONOSCOPY    . COLONOSCOPY WITH PROPOFOL N/A 06/20/2015   Procedure: COLONOSCOPY WITH PROPOFOL;  Surgeon: Lollie Sails, MD;  Location: [facility] ENDOSCOPY;  Service: Endoscopy;  Laterality: N/A;  . EYE SURGERY     Cataract in one eye  . FRACTURE SURGERY     wrist - right  . PARS PLANA VITRECTOMY Left 2010    There were no vitals filed for this visit.   Subjective Assessment - 05/23/20 1615    Subjective Patient is ready to begin her LSVT BIG exercises today. She is getting up out of the chairs at home better.    Pertinent History Patient uses a spc for a year, she uses a RW for indoors. she has a rollator for outdoor use. She has not had any falls in the last 6 months.    Currently in Pain? No/denies    Pain Score 0-No pain    Pain Onset More than a month ago               Treatment:   Patient seen for LSVT Daily  Session Maximal Daily Exercises for facilitation/coordination of movement Sustained movements are designed to rescale the amplitude of movement output for generalization to daily functional activities .Performed as follows for 1 set of 10 repetitions each multidirectional sustained movements  1) Floor to ceiling , cues to hold for 10 seconds,needs modeling for correct positions , VC to reach out further and to reach up to the ceiling 2) Side to side multidirectional Repetitive movements performed in sitting and are designed to provide retraining effort needed for sustained muscle activation in tasks , cues to lean fwd and hold position x 10 counts: Patient does not move fwd<>sidesitting<> to fwd and is not able to transition or stretch out leg very far 3) Step and reach forward , cues for good knee flex, cues to move UE and LE together, cues to rotate  arms up to pronate his forearms 4) Step and reach backwards , cues for BUE back, getting toe up and bending back knee 5) Step and reach sideways, cues to turn  head sideways, and turn head back to neutral, and to rotate arms and pronate forearms 6)  Rock and reach forward/backward , cues to reach far fwd with UE's, patient not comfortable with feet apart , not rocking very far to get a good weight shift, not able to raise  heel or raise  toes much 7) Rock and reach sideways, cues for twist and look behind , only rotates side ways, unable to twist around or turn to look behind   functional component task with supervision 5 reps and simulated activities for: 1. Sit to stand x 10   needs cues to begin with UE up for correct start position 2.did not put on coat due to not bring it. 3.marching up high x 10 4.standing and tapping balloon 5.reaching up high x 5  Patient performed with instruction, verbal cues, tactile cues of therapist: goal: increase tissue extensibility, promote proper posture, improve mobility  Pt educated throughout session about proper  posture and technique with exercises. Improved exercise technique, movement at target joints, use of target muscles after min to mod verbal, visual, tactile cues.                        PT Education - 05/23/20 1616    Education Details HEP    Person(s) Educated Patient    Methods Explanation    Comprehension Returned demonstration;Tactile cues required;Need further instruction            PT Short Term Goals - 05/15/20 1632      PT SHORT TERM GOAL #1   Title Patient will be independent in home exercise program to improve strength/mobility for better functional independence with ADLs.    Time 4    Period Weeks    Status On-going    Target Date 07/29/18      PT SHORT TERM GOAL #2   Title Patient (> 60 years old) will complete five times sit to stand test in < 15 seconds indicating an increased LE strength and improved balance.    Time 4    Period Weeks    Status On-going    Target Date 07/29/18             PT Long Term Goals - 05/17/20 1641      PT LONG TERM GOAL #1   Title Patient will reduce timed up and go to <11 seconds to reduce fall risk and demonstrate improved transfer/gait ability.    Time 4    Period Weeks    Status On-going    Target Date 06/06/20      PT LONG TERM GOAL #2   Title Patient will increase 10 meter walk test to >1.44m/s as to improve gait speed for better community ambulation and to reduce fall risk.    Time 4    Period Weeks    Status On-going    Target Date 06/06/20      PT LONG TERM GOAL #3   Title Patient will reduce timed up and go to <11 seconds to reduce fall risk and demonstrate improved transfer/gait ability.    Time 4    Period Weeks    Status On-going    Target Date 06/06/20                 Plan - 05/23/20 1621    Clinical Impression Statement Patient continues to demonstrates less incoordination of movement with select exercises such as rock and reach and stepping backwards. Patient responds well to  verbal and tactile cues to correct form and technique. Patient is able  to catch mistakes in technique with incorrect positions and is able remember the start and finish positions. Motor control of LE much improved.  Muscle fatigue but no major pain complaints.   Rehab Potential Good    PT Frequency 4x / week    PT Duration 4 weeks    PT Treatment/Interventions Therapeutic exercise;Therapeutic activities;Balance training;Patient/family education;Manual techniques;Dry needling;Neuromuscular re-education    PT Next Visit Plan manual therpay, stretching    Consulted and Agree with Plan of Care Patient           Patient will benefit from skilled therapeutic intervention in order to improve the following deficits and impairments:  Abnormal gait, Difficulty walking, Decreased activity tolerance, Decreased strength, Impaired flexibility, Pain  Visit Diagnosis: Difficulty in walking, not elsewhere classified  Other abnormalities of gait and mobility  Muscle weakness (generalized)  Pain of right lower extremity     Problem List Patient Active Problem List   Diagnosis Date Noted  . Loose stools 03/16/2019  . Allergic rhinitis due to pollen 03/06/2019  . Atrophic vaginitis 11/25/2018  . Spondylolisthesis of lumbosacral region 10/07/2018  . Neuropathy 07/13/2018  . Leg cramps 05/13/2018  . Loss of memory 09/22/2017  . Parkinson's disease ([place]) 08/19/2017  . Prediabetes 07/26/2016  . Pure hypercholesterolemia 12/23/2014  . Anxiety 06/24/2014    Alanson Puls, PT DPT 05/23/2020, 4:22 PM  [place] MAIN [facility] SERVICES [address] [place], [place], [address] Phone: [phone]   Fax:  [phone]  Name: ORCHID GLASSBERG MRN: [id number] Date of Birth: 23-Aug-1946

## 2020-05-24 ENCOUNTER — Other Ambulatory Visit: Payer: Self-pay

## 2020-05-24 ENCOUNTER — Ambulatory Visit: Payer: PPO | Admitting: Physical Therapy

## 2020-05-24 ENCOUNTER — Encounter: Payer: Self-pay | Admitting: Physical Therapy

## 2020-05-24 DIAGNOSIS — R262 Difficulty in walking, not elsewhere classified: Secondary | ICD-10-CM

## 2020-05-24 DIAGNOSIS — M79604 Pain in right leg: Secondary | ICD-10-CM

## 2020-05-24 DIAGNOSIS — R2689 Other abnormalities of gait and mobility: Secondary | ICD-10-CM

## 2020-05-24 DIAGNOSIS — M6281 Muscle weakness (generalized): Secondary | ICD-10-CM

## 2020-05-24 NOTE — Therapy (Signed)
[place] MAIN [facility] SERVICES [address] [place], [place], [address] Phone: [phone]   Fax:  [phone]  Physical Therapy Treatment  Patient Details  Name: Deborah Velazquez MRN: [id number] Date of Birth: Aug 05, 1946 Referring Provider (PT): Jennings Books   Encounter Date: 05/24/2020   PT End of Session - 05/24/20 1715    Visit Number 16    Number of Visits 17    Date for PT Re-Evaluation 09/29/18    PT Start Time 0315    PT Stop Time 0415    PT Time Calculation (min) 60 min    Equipment Utilized During Treatment Gait belt    Activity Tolerance Patient tolerated treatment well    Behavior During Therapy WFL for tasks assessed/performed           Past Medical History:  Diagnosis Date  . Anxiety   . Arthritis   . Hypercholesterolemia   . Hyperlipemia   . Irritable bowel syndrome   . Parkinson disease ([place])   . Restless leg syndrome   . RSD (reflex sympathetic dystrophy)    pt denies  . Stroke [facility])    TIA's    Past Surgical History:  Procedure Laterality Date  . APPENDECTOMY    . BACK SURGERY    . BREAST BIOPSY Left 08/16/2013   neg  . CATARACT EXTRACTION Left   . COLONOSCOPY    . COLONOSCOPY WITH PROPOFOL N/A 06/20/2015   Procedure: COLONOSCOPY WITH PROPOFOL;  Surgeon: Lollie Sails, MD;  Location: [facility] ENDOSCOPY;  Service: Endoscopy;  Laterality: N/A;  . EYE SURGERY     Cataract in one eye  . FRACTURE SURGERY     wrist - right and right leg. (? if any metal)  . PARS PLANA VITRECTOMY Left 2010    There were no vitals filed for this visit.   Subjective Assessment - 05/24/20 1715    Subjective Patient is ready to begin her LSVT BIG exercises today. She is getting up out of the chairs at home better.    Pertinent History Patient uses a spc for a year, she uses a RW for indoors. she has a rollator for outdoor use. She has not had any falls in the last 6 months.    Currently in Pain? No/denies    Pain Score 0-No  pain    Pain Onset More than a month ago                Treatment:   Patient seen for LSVT Daily Session Maximal Daily Exercises for facilitation/coordination of movement Sustained movements are designed to rescale the amplitude of movement output for generalization to daily functional activities .Performed as follows for 1 set of 10 repetitions each multidirectional sustained movements  1) Floor to ceiling , cues to hold for 10 seconds,needs modeling for correct positions , VC to reach out further and to reach up to the ceiling 2) Side to side multidirectional Repetitive movements performed in sitting and are designed to provide retraining effort needed for sustained muscle activation in tasks , cues to lean fwd and hold position x 10 counts: Patient does not move fwd<>sidesitting<> to fwd and is not able to transition or stretch out leg very far 3) Step and reach forward , cues for good knee flex, cues to move UE and LE together, cues to rotate  arms up to pronate his forearms 4) Step and reach backwards , cues for BUE back, getting toe up and bending back  knee 5) Step and reach sideways, cues to turn  head sideways, and turn head back to neutral, and to rotate arms and pronate forearms 6) Rock and reach forward/backward , cues to reach far fwd with UE's, patient not comfortable with feet apart , not rocking very far to get a good weight shift, not able to raise  heel or raise  toes much 7) Rock and reach sideways, cues for twist and look behind , only rotates side ways, unable to twist around or turn to look behind   functional component task with supervision 5 reps and simulated activities for: 1. Sit to stand x 10   needs cues to begin with UE up for correct start position 2. marching  x 10 3.putting on coat with punching into arm of coat-pt does not want to do this exercise 4.standing a tapping balloon x 10 mins 5. Reaching big x 5  Patient performed with instruction, verbal cues,  tactile cues of therapist: goal: increase tissue extensibility, promote proper posture, improve mobility  Pt educated throughout session about proper posture and technique with exercises. Improved exercise technique, movement at target joints, use of target muscles after min to mod verbal, visual, tactile cues.                       PT Education - 05/24/20 1715    Education Details HEP    Person(s) Educated Patient    Methods Explanation    Comprehension Returned demonstration;Verbal cues required;Tactile cues required;Need further instruction            PT Short Term Goals - 05/15/20 1632      PT SHORT TERM GOAL #1   Title Patient will be independent in home exercise program to improve strength/mobility for better functional independence with ADLs.    Time 4    Period Weeks    Status On-going    Target Date 07/29/18      PT SHORT TERM GOAL #2   Title Patient (> 76 years old) will complete five times sit to stand test in < 15 seconds indicating an increased LE strength and improved balance.    Time 4    Period Weeks    Status On-going    Target Date 07/29/18             PT Long Term Goals - 05/17/20 1641      PT LONG TERM GOAL #1   Title Patient will reduce timed up and go to <11 seconds to reduce fall risk and demonstrate improved transfer/gait ability.    Time 4    Period Weeks    Status On-going    Target Date 06/06/20      PT LONG TERM GOAL #2   Title Patient will increase 10 meter walk test to >1.67m/s as to improve gait speed for better community ambulation and to reduce fall risk.    Time 4    Period Weeks    Status On-going    Target Date 06/06/20      PT LONG TERM GOAL #3   Title Patient will reduce timed up and go to <11 seconds to reduce fall risk and demonstrate improved transfer/gait ability.    Time 4    Period Weeks    Status On-going    Target Date 06/06/20                 Plan - 05/24/20 1716    Clinical Impression  Statement Patient continues to demonstrates less incoordination of movement with select exercises such as rock and reach and stepping backwards. Patient responds well to verbal and tactile cues to correct form and technique. Patient is able to catch mistakes in technique with incorrect positions and is able remember the start and finish positions. Motor control of LE much improved.  Muscle fatigue but no major pain complaints.    Rehab Potential Good    PT Frequency 4x / week    PT Duration 4 weeks    PT Treatment/Interventions Therapeutic exercise;Therapeutic activities;Balance training;Patient/family education;Manual techniques;Dry needling;Neuromuscular re-education    PT Next Visit Plan manual therpay, stretching    Consulted and Agree with Plan of Care Patient           Patient will benefit from skilled therapeutic intervention in order to improve the following deficits and impairments:  Abnormal gait, Difficulty walking, Decreased activity tolerance, Decreased strength, Impaired flexibility, Pain  Visit Diagnosis: Difficulty in walking, not elsewhere classified  Other abnormalities of gait and mobility  Muscle weakness (generalized)  Pain of right lower extremity     Problem List Patient Active Problem List   Diagnosis Date Noted  . Loose stools 03/16/2019  . Allergic rhinitis due to pollen 03/06/2019  . Atrophic vaginitis 11/25/2018  . Spondylolisthesis of lumbosacral region 10/07/2018  . Neuropathy 07/13/2018  . Leg cramps 05/13/2018  . Loss of memory 09/22/2017  . Parkinson's disease ([place]) 08/19/2017  . Prediabetes 07/26/2016  . Pure hypercholesterolemia 12/23/2014  . Anxiety 06/24/2014    Alanson Puls, PT DPT 05/24/2020, 5:17 PM  [place] MAIN [facility] SERVICES [address] [place], [place], [address] Phone: [phone]   Fax:  [phone]  Name: Deborah Velazquez MRN: [id number] Date of Birth: [birth year]

## 2020-05-25 ENCOUNTER — Other Ambulatory Visit: Payer: Self-pay

## 2020-05-25 ENCOUNTER — Ambulatory Visit: Payer: PPO | Attending: Neurology | Admitting: Physical Therapy

## 2020-05-25 ENCOUNTER — Encounter: Payer: Self-pay | Admitting: Physical Therapy

## 2020-05-25 DIAGNOSIS — M6281 Muscle weakness (generalized): Secondary | ICD-10-CM | POA: Insufficient documentation

## 2020-05-25 DIAGNOSIS — R262 Difficulty in walking, not elsewhere classified: Secondary | ICD-10-CM | POA: Diagnosis not present

## 2020-05-25 DIAGNOSIS — M79604 Pain in right leg: Secondary | ICD-10-CM | POA: Diagnosis not present

## 2020-05-25 DIAGNOSIS — R2689 Other abnormalities of gait and mobility: Secondary | ICD-10-CM | POA: Diagnosis not present

## 2020-05-25 NOTE — Therapy (Signed)
[place] MAIN [facility] SERVICES [address] [place], [place], [address] Phone: [phone]   Fax:  [phone]  Physical Therapy Treatment/ Discharge summary  Patient Details  Name: Deborah Velazquez MRN: [id number] Date of Birth: Jul 18, 1946 Referring Provider (PT): shah, [place]   Encounter Date: 05/25/2020   PT End of Session - 05/25/20 1510    Visit Number 17    Number of Visits 17    Date for PT Re-Evaluation 09/29/18    PT Start Time 0315    PT Stop Time 0415    PT Time Calculation (min) 60 min    Equipment Utilized During Treatment Gait belt    Activity Tolerance Patient tolerated treatment well    Behavior During Therapy WFL for tasks assessed/performed           Past Medical History:  Diagnosis Date  . Anxiety   . Arthritis   . Hypercholesterolemia   . Hyperlipemia   . Irritable bowel syndrome   . Parkinson disease ([place])   . Restless leg syndrome   . RSD (reflex sympathetic dystrophy)    pt denies  . Stroke [facility])    TIA's    Past Surgical History:  Procedure Laterality Date  . APPENDECTOMY    . BACK SURGERY    . BREAST BIOPSY Left 08/16/2013   neg  . CATARACT EXTRACTION Left   . COLONOSCOPY    . COLONOSCOPY WITH PROPOFOL N/A 06/20/2015   Procedure: COLONOSCOPY WITH PROPOFOL;  Surgeon: Lollie Sails, MD;  Location: [facility] ENDOSCOPY;  Service: Endoscopy;  Laterality: N/A;  . EYE SURGERY     Cataract in one eye  . FRACTURE SURGERY     wrist - right and right leg. (? if any metal)  . PARS PLANA VITRECTOMY Left 2010    There were no vitals filed for this visit.   Subjective Assessment - 05/25/20 1510    Subjective Patient is ready to begin her LSVT BIG exercises today. She is getting up out of the chairs at home better.    Pertinent History Patient uses a spc for a year, she uses a RW for indoors. she has a rollator for outdoor use. She has not had any falls in the last 6 months.    Currently in Pain? No/denies     Pain Score 0-No pain    Pain Onset More than a month ago           OUTCOME MEASURES: evaluation results  TEST Outcome Interpretation  5 times sit<>stand 24.05sec >51 yo, >15 sec indicates increased risk for falls  10 meter walk test .34m/s <1.0 m/s indicates increased risk for falls; limited community ambulator  Timed up and Go 23.44sec <14 sec indicates increased risk for falls  6 minute walk test 385Feet 1000 feet is community ambulator      [address] Peg Test L:36.47R: 36.25      OUTCOME MEASURES: Progress  Note results TEST Outcome Interpretation  5 times sit<>stand 15.03sec >60 yo, >15 sec indicates increased risk for falls  10 meter walk test .10m/s <1.0 m/s indicates increased risk for falls; limited community ambulator  Timed up and Go 16.78sec <14 sec indicates increased risk for falls  6 minute walk test 505Feet 1000 feet is community ambulator      [employer] Peg Test L:31.28R: 33.02       OUTCOME MEASURES: 05/25/20 results TEST Outcome Interpretation  5 times sit<>stand 12.95 sec >60 yo, >15 sec indicates  increased risk for falls  10 meter walk test .27m/s <1.0 m/s indicates increased risk for falls; limited community ambulator  Timed up and Go 14.99sec <14 sec indicates increased risk for falls  6 minute walk test 300Feet 1000 feet is community ambulator      [address] Peg Test L:31.17R:33.0236.25       Treatment:   Patient seen for LSVT Daily Session Maximal Daily Exercises for facilitation/coordination of movement Sustained movements are designed to rescale the amplitude of movement output for generalization to daily functional activities .Performed as follows for 1 set of 10 repetitions each multidirectional sustained movements  1) Floor to ceiling , cues to hold for 10 seconds,needs modeling  for correct positions , VC to reach out further and to reach up to the ceiling 2) Side to side multidirectional Repetitive movements performed in sitting and are designed to provide retraining effort needed for sustained muscle activation in tasks , cues to lean fwd and hold position x 10 counts: Patient does not move fwd<>sidesitting<> to fwd and is not able to transition or stretch out leg very far 3) Step and reach forward , cues for good knee flex, cues to move UE and LE together, cues to rotate  arms up to pronate his forearms 4) Step and reach backwards , cues for BUE back, getting toe up and bending back knee 5) Step and reach sideways, cues to turn  head sideways, and turn head back to neutral, and to rotate arms and pronate forearms 6) Rock and reach forward/backward , cues to reach far fwd with UE's, patient not comfortable with feet apart , not rocking very far to get a good weight shift, not able to raise  heel or raise  toes much 7) Rock and reach sideways, cues for twist and look behind , only rotates side ways, unable to twist around or turn to look behind   functional component task with supervision 5 reps and simulated activities for: 1. Sit to stand x 10   needs cues to begin with UE up for correct start position  Patient performed with instruction, verbal cues, tactile cues of therapist: goal: increase tissue extensibility, promote proper posture, improve mobility  Pt educated throughout session about proper posture and technique with exercises. Improved exercise technique, movement at target joints, use of target muscles after min to mod verbal, visual, tactile cues.                            PT Education - 05/25/20 1510    Education Details LSVT BIG    Person(s) Educated Patient    Methods Explanation    Comprehension Verbalized understanding            PT Short Term Goals - 05/15/20 1632      PT SHORT TERM GOAL #1   Title Patient will be  independent in home exercise program to improve strength/mobility for better functional independence with ADLs.    Time 4    Period Weeks    Status On-going    Target Date 07/29/18      PT SHORT TERM GOAL #2   Title Patient (> 74 years old) will complete five times sit to stand test in < 15 seconds indicating an increased LE strength and improved balance.    Time 4    Period Weeks    Status On-going    Target Date 07/29/18  PT Long Term Goals - 05/25/20 1513      PT LONG TERM GOAL #1   Title Patient will reduce timed up and go to <11 seconds to reduce fall risk and demonstrate improved transfer/gait ability.    Baseline 05/25/20    Time 4    Period Weeks    Status On-going    Target Date 05/25/20      PT LONG TERM GOAL #2   Title Patient will increase 10 meter walk test to >1.21m/s as to improve gait speed for better community ambulation and to reduce fall risk.    Baseline 05/25/20=    Time 4    Period Weeks    Status On-going    Target Date 05/25/20      PT LONG TERM GOAL #3   Title Patient will reduce timed up and go to <11 seconds to reduce fall risk and demonstrate improved transfer/gait ability.    Time 4    Period Weeks    Status On-going    Target Date 05/25/20                 Plan - 05/25/20 1512    Clinical Impression Statement Patient has demonstrated improved dynamic balance, gait, transfers, mobility, LE strength, and has made progress with goals.. Patient will be discharged from skilled physical therapy to HEP.   Rehab Potential Good    PT Frequency 4x / week    PT Duration 4 weeks    PT Treatment/Interventions Therapeutic exercise;Therapeutic activities;Balance training;Patient/family education;Manual techniques;Dry needling;Neuromuscular re-education    PT Next Visit Plan manual therpay, stretching    Consulted and Agree with Plan of Care Patient           Patient will benefit from skilled therapeutic intervention in order to  improve the following deficits and impairments:  Abnormal gait, Difficulty walking, Decreased activity tolerance, Decreased strength, Impaired flexibility, Pain  Visit Diagnosis: Difficulty in walking, not elsewhere classified  Other abnormalities of gait and mobility  Muscle weakness (generalized)  Pain of right lower extremity     Problem List Patient Active Problem List   Diagnosis Date Noted  . Loose stools 03/16/2019  . Allergic rhinitis due to pollen 03/06/2019  . Atrophic vaginitis 11/25/2018  . Spondylolisthesis of lumbosacral region 10/07/2018  . Neuropathy 07/13/2018  . Leg cramps 05/13/2018  . Loss of memory 09/22/2017  . Parkinson's disease ([place]) 08/19/2017  . Prediabetes 07/26/2016  . Pure hypercholesterolemia 12/23/2014  . Anxiety 06/24/2014    Alanson Puls, PT DPT 05/25/2020, 4:24 PM  [place] MAIN [facility] SERVICES [address] [place], [place], [address] Phone: [phone]   Fax:  [phone]  Name: Deborah Velazquez MRN: [id number] Date of Birth: Sep 12, 1946

## 2020-05-30 ENCOUNTER — Other Ambulatory Visit: Payer: PPO

## 2020-05-30 ENCOUNTER — Ambulatory Visit: Payer: PPO | Admitting: Physical Therapy

## 2020-05-31 ENCOUNTER — Ambulatory Visit: Payer: PPO | Admitting: Physical Therapy

## 2020-06-01 ENCOUNTER — Ambulatory Visit
Admission: RE | Admit: 2020-06-01 | Discharge: 2020-06-01 | Disposition: A | Discharge status: Home / Self Care | Payer: PPO | Attending: Ophthalmology | Admitting: Ophthalmology

## 2020-06-01 ENCOUNTER — Ambulatory Visit: Payer: PPO | Admitting: Physical Therapy

## 2020-06-01 ENCOUNTER — Ambulatory Visit: Payer: PPO | Admitting: Certified Registered Nurse Anesthetist

## 2020-06-01 ENCOUNTER — Encounter: Payer: Self-pay | Admitting: Ophthalmology

## 2020-06-01 ENCOUNTER — Encounter
Admission: RE | Disposition: A | Discharge status: Home / Self Care | Payer: Self-pay | Source: Home / Self Care | Attending: Ophthalmology

## 2020-06-01 ENCOUNTER — Telehealth: Payer: Self-pay

## 2020-06-01 DIAGNOSIS — H2511 Age-related nuclear cataract, right eye: Secondary | ICD-10-CM | POA: Diagnosis not present

## 2020-06-01 DIAGNOSIS — H269 Unspecified cataract: Secondary | ICD-10-CM | POA: Diagnosis not present

## 2020-06-01 DIAGNOSIS — Z888 Allergy status to other drugs, medicaments and biological substances status: Secondary | ICD-10-CM | POA: Insufficient documentation

## 2020-06-01 DIAGNOSIS — Z7982 Long term (current) use of aspirin: Secondary | ICD-10-CM | POA: Diagnosis not present

## 2020-06-01 DIAGNOSIS — Z79899 Other long term (current) drug therapy: Secondary | ICD-10-CM | POA: Insufficient documentation

## 2020-06-01 DIAGNOSIS — E785 Hyperlipidemia, unspecified: Secondary | ICD-10-CM | POA: Diagnosis not present

## 2020-06-01 DIAGNOSIS — E7800 Pure hypercholesterolemia, unspecified: Secondary | ICD-10-CM | POA: Insufficient documentation

## 2020-06-01 DIAGNOSIS — G2581 Restless legs syndrome: Secondary | ICD-10-CM | POA: Insufficient documentation

## 2020-06-01 DIAGNOSIS — F419 Anxiety disorder, unspecified: Secondary | ICD-10-CM | POA: Diagnosis not present

## 2020-06-01 DIAGNOSIS — G20 Parkinson's disease: Secondary | ICD-10-CM | POA: Diagnosis not present

## 2020-06-01 DIAGNOSIS — Z8673 Personal history of transient ischemic attack (TIA), and cerebral infarction without residual deficits: Secondary | ICD-10-CM | POA: Insufficient documentation

## 2020-06-01 DIAGNOSIS — Z79890 Hormone replacement therapy (postmenopausal): Secondary | ICD-10-CM | POA: Insufficient documentation

## 2020-06-01 HISTORY — PX: CATARACT EXTRACTION W/PHACO: SHX586

## 2020-06-01 HISTORY — DX: Pure hypercholesterolemia, unspecified: E78.00

## 2020-06-01 HISTORY — DX: Cerebral infarction, unspecified: I63.9

## 2020-06-01 SURGERY — PHACOEMULSIFICATION, CATARACT, WITH IOL INSERTION
Anesthesia: Monitor Anesthesia Care | Site: Eye | Laterality: Right

## 2020-06-01 MED ORDER — SODIUM CHLORIDE 0.9 % IV SOLN: INTRAVENOUS | Status: DC

## 2020-06-01 MED ORDER — ARMC OPHTHALMIC DILATING DROPS
OPHTHALMIC | Status: AC
  Administered 2020-06-01: 1 via OPHTHALMIC
  Filled 2020-06-01: qty 0.5

## 2020-06-01 MED ORDER — POVIDONE-IODINE 5 % OP SOLN
OPHTHALMIC | Status: DC | PRN
  Administered 2020-06-01: 1 via OPHTHALMIC

## 2020-06-01 MED ORDER — MIDAZOLAM HCL 2 MG/2ML IJ SOLN
INTRAMUSCULAR | Status: DC | PRN
  Administered 2020-06-01: .5 mg via INTRAVENOUS

## 2020-06-01 MED ORDER — LACTATED RINGERS IV SOLN: INTRAVENOUS | Status: DC

## 2020-06-01 MED ORDER — NA CHONDROIT SULF-NA HYALURON 40-30 MG/ML IO SOLN
INTRAOCULAR | Status: DC | PRN
  Administered 2020-06-01: 1 mL via INTRAOCULAR

## 2020-06-01 MED ORDER — MIDAZOLAM HCL 2 MG/2ML IJ SOLN
INTRAMUSCULAR | Status: AC
  Filled 2020-06-01: qty 2

## 2020-06-01 MED ORDER — TETRACAINE HCL 0.5 % OP SOLN
OPHTHALMIC | Status: AC
  Administered 2020-06-01: 1 [drp] via OPHTHALMIC
  Filled 2020-06-01: qty 4

## 2020-06-01 MED ORDER — LIDOCAINE HCL (PF) 4 % IJ SOLN
INTRAOCULAR | Status: DC | PRN
  Administered 2020-06-01: 4 mL via OPHTHALMIC

## 2020-06-01 MED ORDER — NEOMYCIN-POLYMYXIN-DEXAMETH 0.1 % OP OINT
TOPICAL_OINTMENT | OPHTHALMIC | Status: DC | PRN
  Administered 2020-06-01: 1 via OPHTHALMIC

## 2020-06-01 MED ORDER — MOXIFLOXACIN HCL 0.5 % OP SOLN
OPHTHALMIC | Status: AC
  Administered 2020-06-01: 1 [drp] via OPHTHALMIC
  Filled 2020-06-01: qty 6

## 2020-06-01 MED ORDER — PROVISC 10 MG/ML IO SOLN
INTRAOCULAR | Status: AC
  Filled 2020-06-01: qty 0.55

## 2020-06-01 MED ORDER — ARMC OPHTHALMIC DILATING DROPS: 1.0000 "application " | OPHTHALMIC | Status: AC

## 2020-06-01 MED ORDER — EPINEPHRINE PF 1 MG/ML IJ SOLN
INTRAOCULAR | Status: DC | PRN
  Administered 2020-06-01: 200 mL via OPHTHALMIC

## 2020-06-01 MED ORDER — MOXIFLOXACIN HCL 0.5 % OP SOLN: 1.0000 [drp] | OPHTHALMIC | Status: DC

## 2020-06-01 MED ORDER — TETRACAINE HCL 0.5 % OP SOLN: 1.0000 [drp] | Freq: Once | OPHTHALMIC | Status: AC

## 2020-06-01 MED ORDER — MOXIFLOXACIN HCL 0.5 % OP SOLN: 1.0000 [drp] | Freq: Once | OPHTHALMIC | Status: AC

## 2020-06-01 SURGICAL SUPPLY — 19 items
GLOVE BIO SURGEON STRL SZ8 (GLOVE) ×3 IMPLANT
GLOVE BIOGEL M 6.5 STRL (GLOVE) ×3 IMPLANT
GLOVE SURG LX 7.5 STRW (GLOVE) ×2
GLOVE SURG LX STRL 7.5 STRW (GLOVE) ×1 IMPLANT
GOWN STRL REUS W/ TWL LRG LVL3 (GOWN DISPOSABLE) ×2 IMPLANT
GOWN STRL REUS W/TWL LRG LVL3 (GOWN DISPOSABLE) ×6
LABEL CATARACT MEDS ST (LABEL) ×3 IMPLANT
LENS IOL ACRSF IQ ULTRA 19.0 (Intraocular Lens) IMPLANT
LENS IOL ACRYSOF IQ 19.0 (Intraocular Lens) ×3 IMPLANT
NDL HPO THNWL 1X22GA REG BVL (NEEDLE) ×1 IMPLANT
NEEDLE SAFETY 22GX1 (NEEDLE) ×3
PACK CATARACT (MISCELLANEOUS) ×3 IMPLANT
PACK CATARACT BRASINGTON LX (MISCELLANEOUS) ×3 IMPLANT
PACK EYE AFTER SURG (MISCELLANEOUS) ×3 IMPLANT
SOL BSS BAG (MISCELLANEOUS) ×3
SOLUTION BSS BAG (MISCELLANEOUS) ×1 IMPLANT
SYR 5ML LL (SYRINGE) ×3 IMPLANT
WATER STERILE IRR 250ML POUR (IV SOLUTION) ×3 IMPLANT
WIPE NON LINTING 3.25X3.25 (MISCELLANEOUS) ×3 IMPLANT

## 2020-06-01 NOTE — Anesthesia Postprocedure Evaluation (Signed)
Anesthesia Post Note  Patient: Deborah Velazquez  Procedure(s) Performed: CATARACT EXTRACTION PHACO AND INTRAOCULAR LENS PLACEMENT (IOC) RIGHT (Right Eye)  Patient location during evaluation: PACU Anesthesia Type: MAC Level of consciousness: awake and alert Pain management: pain level controlled Vital Signs Assessment: post-procedure vital signs reviewed and stable Respiratory status: spontaneous breathing, nonlabored ventilation and respiratory function stable Cardiovascular status: stable and blood pressure returned to baseline Postop Assessment: no apparent nausea or vomiting Anesthetic complications: no   No complications documented.   Last Vitals:  Vitals:   06/01/20 0826 06/01/20 1032  BP: 124/81 128/77  Pulse: 73 74  Resp: 16 16  Temp: (!) 36.1 C 36.7 C  SpO2: 95% 100%    Last Pain:  Vitals:   06/01/20 1032  TempSrc: Oral  PainSc: 0-No pain                 Rolla Plate P

## 2020-06-01 NOTE — Discharge Instructions (Addendum)
Eye Surgery Discharge Instructions  Expect mild scratchy sensation or mild soreness. DO NOT RUB YOUR EYE!  The day of surgery: . Minimal physical activity, but bed rest is not required . No reading, computer work, or close hand work . No bending, lifting, or straining. . May watch TV  For 24 hours: . No driving, legal decisions, or alcoholic beverages . Safety precautions . Eat anything you prefer: It is better to start with liquids, then soup then solid foods. . Solar shield eyeglasses should be worn for comfort in the sunlight/patch while sleeping  Resume all regular medications including aspirin or Coumadin if these were discontinued prior to surgery. You may shower, bathe, shave, or wash your hair. Tylenol may be taken for mild discomfort. Follow eye drop instruction sheet as reviewed. Call your doctor if you experience significant pain, nausea, or vomiting, fever > 101 or other signs of infection. [phone] or [phone] Specific instructions:   Follow-up Information    Leandrew Koyanagi, MD Follow up.   Specialty: Ophthalmology Why: 06/02/20 @ 11:00 am Contact information: [address]   [place] [address] [phone]

## 2020-06-01 NOTE — Telephone Encounter (Signed)
Spoke with pt and she stated that I would need to call back tomorrow to schedule an appt.

## 2020-06-01 NOTE — H&P (Signed)

## 2020-06-01 NOTE — Anesthesia Procedure Notes (Signed)
Procedure Name: MAC Date/Time: 06/01/2020 10:08 AM Performed by: Rolla Plate, CRNA Pre-anesthesia Checklist: Patient identified, Suction available, Emergency Drugs available and Patient being monitored Oxygen Delivery Method: Nasal cannula

## 2020-06-01 NOTE — Op Note (Signed)
OPERATIVE NOTE  Deborah Velazquez [id number] 06/01/2020   PREOPERATIVE DIAGNOSIS:  Nuclear Sclerotic Cataract Right Eye H25.11   POSTOPERATIVE DIAGNOSIS: Nuclear Sclerotic Cataract Right Eye H25.11          PROCEDURE:  Phacoemusification with posterior chamber intraocular lens placement of the right eye  Procedure(s) with comments: CATARACT EXTRACTION PHACO AND INTRAOCULAR LENS PLACEMENT (IOC) RIGHT (Right) - cde5.26 us01:05.7 ap8.0  LENS:   Implant Name Type Inv. Item Serial No. Manufacturer Lot No. LRB No. Used Action  LENS IOL ACRYSOF IQ 19.0 - [id number] Intraocular Lens LENS IOL ACRYSOF IQ 19.0 [id number] ALCON  Right 1 Implanted        SURGEON:  Wyonia Hough, MD   ANESTHESIA:  Topical with tetracaine drops and 2% Xylocaine jelly, augmented with 1% preservative-free intracameral lidocaine.    COMPLICATIONS:  None.   DESCRIPTION OF PROCEDURE:  The patient was identified in the holding room and transported to the operating room and placed in the supine position under the operating microscope. Theright eye was identified as the operative eye and it was prepped and draped in the usual sterile ophthalmic fashion.   A 1 millimeter clear-corneal paracentesis was made at the 12:00 position.  0.5 ml of preservative-free 1% lidocaine was injected into the anterior chamber. The anterior chamber was filled with Viscoat viscoelastic.  A 2.4 millimeter keratome was used to make a near-clear corneal incision at the 9:00 position. A curvilinear capsulorrhexis was made with a cystotome and capsulorrhexis forceps.  Balanced salt solution was used to hydrodissect and hydrodelineate the nucleus.   Phacoemulsification was then used in stop and chop fashion to remove the lens nucleus and epinucleus.  The remaining cortex was then removed using the irrigation and aspiration handpiece. Provisc was then placed into the capsular bag to distend it for lens placement.  A lens was then injected  into the capsular bag.  The remaining viscoelastic was aspirated.  Wounds were hydrated with balanced salt solution.  The anterior chamber was inflated to a physiologic pressure with balanced salt solution. Vigamox 0.2 ml of a 1mg  per ml solution was injected into the anterior chamber for a dose of 0.2 mg of intracameral antibiotic at the completion of the case. Miostat was placed into the anterior chamber to constrict the pupil.  No wound leaks were noted.  Topical Vigamox drops and Maxitrol ointment were applied to the eye.  The patient was taken to the recovery room in stable condition without complications of anesthesia or surgery.  [name] 06/01/2020, 10:29 AM

## 2020-06-01 NOTE — Transfer of Care (Signed)
Immediate Anesthesia Transfer of Care Note  Patient: Deborah Velazquez  Procedure(s) Performed: CATARACT EXTRACTION PHACO AND INTRAOCULAR LENS PLACEMENT (IOC) RIGHT (Right Eye)  Patient Location: PACU and Short Stay  Anesthesia Type:MAC  Level of Consciousness: awake, alert  and oriented  Airway & Oxygen Therapy: Patient Spontanous Breathing  Post-op Assessment: Report given to RN and Post -op Vital signs reviewed and stable  Post vital signs: Reviewed and stable  Last Vitals:  Vitals Value Taken Time  BP 128/77 06/01/20 1032  Temp 36.7 C 06/01/20 1032  Pulse 74 06/01/20 1032  Resp 12 06/01/20 1032  SpO2 99 % 06/01/20 1032    Last Pain:  Vitals:   06/01/20 1032  TempSrc: Oral  PainSc:          Complications: No complications documented.

## 2020-06-01 NOTE — Anesthesia Preprocedure Evaluation (Addendum)
Anesthesia Evaluation  Patient identified by MRN, date of birth, ID band Patient awake    Reviewed: Allergy & Precautions, H&P , NPO status , Patient's Chart, lab work & pertinent test results  History of Anesthesia Complications Negative for: history of anesthetic complications  Airway Mallampati: II  TM Distance: >3 FB Neck ROM: limited    Dental  (+) Teeth Intact   Pulmonary neg pulmonary ROS, neg COPD,    breath sounds clear to auscultation       Cardiovascular (-) angina(-) Past MI and (-) Cardiac Stents negative cardio ROS  (-) dysrhythmias  Rhythm:regular Rate:Normal     Neuro/Psych Anxiety Parkinson's disease TIA's    GI/Hepatic negative GI ROS, Neg liver ROS,   Endo/Other  negative endocrine ROS  Renal/GU      Musculoskeletal   Abdominal   Peds  Hematology negative hematology ROS (+)   Anesthesia Other Findings Past Medical History: No date: Anxiety No date: Arthritis No date: Hypercholesterolemia No date: Hyperlipemia No date: Irritable bowel syndrome No date: Parkinson disease ([place]) No date: Restless leg syndrome No date: RSD (reflex sympathetic dystrophy)     Comment:  pt denies No date: Stroke [facility])     Comment:  TIA's  Past Surgical History: No date: APPENDECTOMY No date: BACK SURGERY 08/16/2013: BREAST BIOPSY; Left     Comment:  neg No date: CATARACT EXTRACTION; Left No date: COLONOSCOPY 06/20/2015: COLONOSCOPY WITH PROPOFOL; N/A     Comment:  Procedure: COLONOSCOPY WITH PROPOFOL;  Surgeon: Lollie Sails, MD;  Location: [facility] ENDOSCOPY;  Service:               Endoscopy;  Laterality: N/A; No date: EYE SURGERY     Comment:  Cataract in one eye No date: FRACTURE SURGERY     Comment:  wrist - right and right leg. (? if any metal) 2010: PARS PLANA VITRECTOMY; Left  BMI    Body Mass Index: 26.79 kg/m      Reproductive/Obstetrics negative OB ROS                             Anesthesia Physical Anesthesia Plan  ASA: III  Anesthesia Plan: MAC   Post-op Pain Management:    Induction:   PONV Risk Score and Plan:   Airway Management Planned: Natural Airway and Nasal Cannula  Additional Equipment:   Intra-op Plan:   Post-operative Plan:   Informed Consent: I have reviewed the patients History and Physical, chart, labs and discussed the procedure including the risks, benefits and alternatives for the proposed anesthesia with the patient or authorized representative who has indicated his/her understanding and acceptance.       Plan Discussed with: Anesthesiologist and CRNA  Anesthesia Plan Comments:         Anesthesia Quick Evaluation

## 2020-06-02 ENCOUNTER — Encounter: Payer: Self-pay | Admitting: Ophthalmology

## 2020-06-05 ENCOUNTER — Ambulatory Visit: Payer: PPO | Admitting: Physical Therapy

## 2020-06-12 ENCOUNTER — Ambulatory Visit: Payer: PPO | Admitting: Physical Therapy

## 2020-06-12 NOTE — Telephone Encounter (Signed)
Pt has scheduled an appt for 06/29/2020.

## 2020-06-16 ENCOUNTER — Ambulatory Visit: Payer: PPO

## 2020-06-19 ENCOUNTER — Ambulatory Visit: Payer: PPO | Admitting: Physical Therapy

## 2020-06-22 ENCOUNTER — Ambulatory Visit (INDEPENDENT_AMBULATORY_CARE_PROVIDER_SITE_OTHER): Payer: PPO

## 2020-06-22 VITALS — Ht 66.0 in | Wt 166.0 lb

## 2020-06-22 DIAGNOSIS — Z0000 Encounter for general adult medical examination without abnormal findings: Secondary | ICD-10-CM | POA: Diagnosis not present

## 2020-06-22 NOTE — Patient Instructions (Addendum)
Deborah Velazquez , Thank you for taking time to come for your Medicare Wellness Visit. I appreciate your ongoing commitment to your health goals. Please review the following plan we discussed and let me know if I can assist you in the future.   These are the goals we discussed: Goals     Follow up with Primary Care Provider     Follow up as needed Read more aloud Continue parkinson exercises      Increase physical activity     Walk daily for exercise  Passive bike riding       This is a list of the screening recommended for you and due dates:  Health Maintenance  Topic Date Due    Hepatitis C: One time screening is recommended by Center for Disease Control  (CDC) for  adults born from [birth year] through 1967.   Never done   DEXA scan (bone density measurement)  Never done   Pneumonia vaccines (1 of 2 - PCV13) Never done   Flu Shot  06/25/2020   Tetanus Vaccine  06/24/2024   Colon Cancer Screening  06/19/2025   COVID-19 Vaccine  Completed    Immunizations Immunization History  Administered Date(s) Administered   Influenza, High Dose Seasonal PF 09/11/2018   Moderna SARS-COVID-2 Vaccination 01/22/2020, 02/19/2020   Tdap 06/24/2014   Keep all routine maintenance appointments.   Cpe 06/29/20 @ 10:30  Advanced directives: End of life planning; Advance aging; Advanced directives discussed.  Copy of current HCPOA/Living Will requested.    Conditions/risks identified: none new  Follow up in one year for your annual wellness visit    Preventive Care 65 Years and Older, Female Preventive care refers to lifestyle choices and visits with your health care provider that can promote health and wellness. What does preventive care include?  A yearly physical exam. This is also called an annual well check.  Dental exams once or twice a year.  Routine eye exams. Ask your health care provider how often you should have your eyes checked.  Personal lifestyle choices,  including:  Daily care of your teeth and gums.  Regular physical activity.  Eating a healthy diet.  Avoiding tobacco and drug use.  Limiting alcohol use.  Practicing safe sex.  Taking low-dose aspirin every day.  Taking vitamin and mineral supplements as recommended by your health care provider. What happens during an annual well check? The services and screenings done by your health care provider during your annual well check will depend on your age, overall health, lifestyle risk factors, and family history of disease. Counseling  Your health care provider may ask you questions about your:  Alcohol use.  Tobacco use.  Drug use.  Emotional well-being.  Home and relationship well-being.  Sexual activity.  Eating habits.  History of falls.  Memory and ability to understand (cognition).  Work and work Statistician.  Reproductive health. Screening  You may have the following tests or measurements:  Height, weight, and BMI.  Blood pressure.  Lipid and cholesterol levels. These may be checked every 5 years, or more frequently if you are over 32 years old.  Skin check.  Lung cancer screening. You may have this screening every year starting at age 42 if you have a 30-pack-year history of smoking and currently smoke or have quit within the past 15 years.  Fecal occult blood test (FOBT) of the stool. You may have this test every year starting at age 12.  Flexible sigmoidoscopy or colonoscopy. You may have  a sigmoidoscopy every 5 years or a colonoscopy every 10 years starting at age 31.  Hepatitis C blood test.  Hepatitis B blood test.  Sexually transmitted disease (STD) testing.  Diabetes screening. This is done by checking your blood sugar (glucose) after you have not eaten for a while (fasting). You may have this done every 1-3 years.  Bone density scan. This is done to screen for osteoporosis. You may have this done starting at age 10.  Mammogram. This  may be done every 1-2 years. Talk to your health care provider about how often you should have regular mammograms. Talk with your health care provider about your test results, treatment options, and if necessary, the need for more tests. Vaccines  Your health care provider may recommend certain vaccines, such as:  Influenza vaccine. This is recommended every year.  Tetanus, diphtheria, and acellular pertussis (Tdap, Td) vaccine. You may need a Td booster every 10 years.  Zoster vaccine. You may need this after age 60.  Pneumococcal 13-valent conjugate (PCV13) vaccine. One dose is recommended after age 92.  Pneumococcal polysaccharide (PPSV23) vaccine. One dose is recommended after age 19. Talk to your health care provider about which screenings and vaccines you need and how often you need them. This information is not intended to replace advice given to you by your health care provider. Make sure you discuss any questions you have with your health care provider. Document Released: 12/08/2015 Document Revised: 07/31/2016 Document Reviewed: 09/12/2015 Elsevier Interactive Patient Education  2017 [employer] Prevention in the Home Falls can cause injuries. They can happen to people of all ages. There are many things you can do to make your home safe and to help prevent falls. What can I do on the outside of my home?  Regularly fix the edges of walkways and driveways and fix any cracks.  Remove anything that might make you trip as you walk through a door, such as a raised step or threshold.  Trim any bushes or trees on the path to your home.  Use bright outdoor lighting.  Clear any walking paths of anything that might make someone trip, such as rocks or tools.  Regularly check to see if handrails are loose or broken. Make sure that both sides of any steps have handrails.  Any raised decks and porches should have guardrails on the edges.  Have any leaves, snow, or ice cleared  regularly.  Use sand or salt on walking paths during winter.  Clean up any spills in your garage right away. This includes oil or grease spills. What can I do in the bathroom?  Use night lights.  Install grab bars by the toilet and in the tub and shower. Do not use towel bars as grab bars.  Use non-skid mats or decals in the tub or shower.  If you need to sit down in the shower, use a plastic, non-slip stool.  Keep the floor dry. Clean up any water that spills on the floor as soon as it happens.  Remove soap buildup in the tub or shower regularly.  Attach bath mats securely with double-sided non-slip rug tape.  Do not have throw rugs and other things on the floor that can make you trip. What can I do in the bedroom?  Use night lights.  Make sure that you have a light by your bed that is easy to reach.  Do not use any sheets or blankets that are too big for your bed.  They should not hang down onto the floor.  Have a firm chair that has side arms. You can use this for support while you get dressed.  Do not have throw rugs and other things on the floor that can make you trip. What can I do in the kitchen?  Clean up any spills right away.  Avoid walking on wet floors.  Keep items that you use a lot in easy-to-reach places.  If you need to reach something above you, use a strong step stool that has a grab bar.  Keep electrical cords out of the way.  Do not use floor polish or wax that makes floors slippery. If you must use wax, use non-skid floor wax.  Do not have throw rugs and other things on the floor that can make you trip. What can I do with my stairs?  Do not leave any items on the stairs.  Make sure that there are handrails on both sides of the stairs and use them. Fix handrails that are broken or loose. Make sure that handrails are as long as the stairways.  Check any carpeting to make sure that it is firmly attached to the stairs. Fix any carpet that is loose  or worn.  Avoid having throw rugs at the top or bottom of the stairs. If you do have throw rugs, attach them to the floor with carpet tape.  Make sure that you have a light switch at the top of the stairs and the bottom of the stairs. If you do not have them, ask someone to add them for you. What else can I do to help prevent falls?  Wear shoes that:  Do not have high heels.  Have rubber bottoms.  Are comfortable and fit you well.  Are closed at the toe. Do not wear sandals.  If you use a stepladder:  Make sure that it is fully opened. Do not climb a closed stepladder.  Make sure that both sides of the stepladder are locked into place.  Ask someone to hold it for you, if possible.  Clearly mark and make sure that you can see:  Any grab bars or handrails.  First and last steps.  Where the edge of each step is.  Use tools that help you move around (mobility aids) if they are needed. These include:  Canes.  Walkers.  Scooters.  Crutches.  Turn on the lights when you go into a dark area. Replace any light bulbs as soon as they burn out.  Set up your furniture so you have a clear path. Avoid moving your furniture around.  If any of your floors are uneven, fix them.  If there are any pets around you, be aware of where they are.  Review your medicines with your doctor. Some medicines can make you feel dizzy. This can increase your chance of falling. Ask your doctor what other things that you can do to help prevent falls. This information is not intended to replace advice given to you by your health care provider. Make sure you discuss any questions you have with your health care provider. Document Released: 09/07/2009 Document Revised: 04/18/2016 Document Reviewed: 12/16/2014 Elsevier Interactive Patient Education  2017 [employer].

## 2020-06-22 NOTE — Progress Notes (Addendum)
Subjective:   Deborah Velazquez is a 74 y.o. female who presents for Medicare Annual (Subsequent) preventive examination.  Review of Systems    No ROS.  Medicare Wellness Virtual Visit.  Cardiac Risk Factors include: advanced age (>40men, >82 women)     Objective:    Today's Vitals   06/22/20 1409  Weight: 166 lb (75.3 kg)  Height: 5\' 6"  (1.676 m)   Body mass index is 26.79 kg/m.  Advanced Directives 06/22/2020 04/25/2020 06/16/2019 10/15/2018 10/13/2018 09/28/2018 06/30/2018  Does Patient Have a Medical Advance Directive? Yes Yes Yes Yes Yes Yes Yes  Type of Paramedic of [place];Living will Living will;Healthcare Power of [place];Living will [place];Living will [place];Living will [place];Living will [place];Living will  Does patient want to make changes to medical advance directive? No - Patient declined - No - Patient declined No - Patient declined No - Patient declined No - Patient declined -  Copy of [place] in Chart? No - copy requested - No - copy requested - No - copy requested No - copy requested -    Current Medications (verified) Outpatient Encounter Medications as of 06/22/2020  Medication Sig   aspirin EC 81 MG tablet Take 81 mg by mouth daily. Swallow whole.   atorvastatin (LIPITOR) 10 MG tablet TAKE 1 TABLET(10 MG) BY MOUTH AT BEDTIME (Patient taking differently: Take 10 mg by mouth every evening. TAKE 1 TABLET(10 MG) BY MOUTH AT BEDTIME)   carbidopa-levodopa (SINEMET CR) 50-200 MG tablet Take 1 tablet by mouth at bedtime.   carbidopa-levodopa (SINEMET IR) 25-100 MG tablet 2 in the morning, 1 in the afternoon, 1 in the evening (Patient taking differently: Take 1 tablet by mouth 3 (three) times daily. 1 in the morning, 1 in the afternoon, 1 in the evening; 8 am, 1pm, 6pm)   Coenzyme Q10 (COQ-10) 100 MG CAPS Take 1  capsule by mouth daily.   DULoxetine (CYMBALTA) 60 MG capsule Take 60 mg by mouth daily.    estradiol (ESTRACE) 0.1 MG/GM vaginal cream Place 1 Applicatorful vaginally at bedtime. For 2 weeks,  Then twice weekly thereafter (Patient taking differently: Place 1 Applicatorful vaginally 2 (two) times a week. )   OVER THE COUNTER MEDICATION Take 1 capsule by mouth in the morning and at bedtime. Doterra Life Long Vitality Pack   pregabalin (LYRICA) 150 MG capsule Take 150 mg by mouth 2 (two) times daily.    sertraline (ZOLOFT) 100 MG tablet Take 200 mg by mouth daily.    No facility-administered encounter medications on file as of 06/22/2020.    Allergies (verified) Pseudoephedrine hcl and Sudafed [pseudoephedrine]   History: Past Medical History:  Diagnosis Date   Anxiety    Arthritis    Hypercholesterolemia    Hyperlipemia    Irritable bowel syndrome    Parkinson disease ([place])    Restless leg syndrome    RSD (reflex sympathetic dystrophy)    pt denies   Stroke [facility])    TIA's   Past Surgical History:  Procedure Laterality Date   APPENDECTOMY     BACK SURGERY     BREAST BIOPSY Left 08/16/2013   neg   CATARACT EXTRACTION Left    CATARACT EXTRACTION W/PHACO Right 06/01/2020   Procedure: CATARACT EXTRACTION PHACO AND INTRAOCULAR LENS PLACEMENT ([place]) RIGHT;  Surgeon: Leandrew Koyanagi, MD;  Location: [facility] ORS;  Service: Ophthalmology;  Laterality: Right;  cde5.26 us01:05.7 ap8.0   COLONOSCOPY     COLONOSCOPY WITH PROPOFOL N/A 06/20/2015   Procedure: COLONOSCOPY WITH PROPOFOL;  Surgeon: Lollie Sails, MD;  Location: [facility] ENDOSCOPY;  Service: Endoscopy;  Laterality: N/A;   EYE SURGERY     Cataract in one eye   FRACTURE SURGERY     wrist - right and right leg. (? if any metal)   PARS PLANA VITRECTOMY Left 2010   Family History  Problem Relation Age of Onset   Breast cancer Mother 68   Diabetes Mother    Cancer Mother    Heart disease Mother    Alzheimer's disease Father     Heart disease Maternal Aunt    Social History   Socioeconomic History   Marital status: Married    Spouse name: Sonia Side   Number of children: Not on file   Years of education: Not on file   Highest education level: Not on file  Occupational History   Occupation: retired    Comment: Press photographer  Tobacco Use   Smoking status: Never Smoker   Smokeless tobacco: Never Used  Scientific laboratory technician Use: Never used  Substance and Sexual Activity   Alcohol use: Yes    Comment: 1-2 x a week   Drug use: No   Sexual activity: Not Currently  Other Topics Concern   Not on file  Social History Narrative   Not on file   Social Determinants of Health   Financial Resource Strain: Low Risk    Difficulty of Paying Living Expenses: Not hard at all  Food Insecurity:    Worried About Charity fundraiser in the Last Year:    Arboriculturist in the Last Year:   Transportation Needs: No Transportation Needs   Lack of Transportation (Medical): No   Lack of Transportation (Non-Medical): No  Physical Activity:    Days of Exercise per Week:    Minutes of Exercise per Session:   Stress:    Feeling of Stress :   Social Connections: Unknown   Frequency of Communication with Friends and Family: Not on file   Frequency of Social Gatherings with Friends and Family: More than three times a week   Attends Religious Services: Not on Electrical engineer or Organizations: Not on file   Attends Archivist Meetings: Not on file   Marital Status: Married    Tobacco Counseling Counseling given: Not Answered   Clinical Intake:  Pre-visit preparation completed: Yes        Diabetes: No  How often do you need to have someone help you when you read instructions, pamphlets, or other written materials from your doctor or pharmacy?: 1 - Never   Interpreter Needed?: No      Activities of Daily Living In your present state of health, do you have any difficulty performing the following  activities: 06/22/2020  Hearing? N  Vision? N  Difficulty concentrating or making decisions? Y  Walking or climbing stairs? Y  Comment Caregiver assist  Dressing or bathing? Y  Comment Caregiver assist  Doing errands, shopping? Y  Comment She does not Physiological scientist and eating ? Y  Comment Caregiver assist  Using the Toilet? N  In the past six months, have you accidently leaked urine? Y  Comment Managed with daily brief/pad  Do you have problems with loss of bowel control? N  Managing your Medications? Y  Comment Caregiver assist  Managing  your Finances? Y  Comment Caregiver assist  Housekeeping or managing your Housekeeping? Y  Comment Caregiver assist  Some recent data might be hidden    Patient Care Team: [name], MD as PCP - General (Internal Medicine)  Indicate any recent Medical Services you may have received from other than [facility] providers in the past year (date may be approximate).     Assessment:   This is a routine wellness examination for Mehreen.  I connected with Soma today by telephone and verified that I am speaking with the correct person using two identifiers. Location patient: home Location provider: work Persons participating in the virtual visit: patient, Marine scientist.    I discussed the limitations, risks, security and privacy concerns of performing an evaluation and management service by telephone and the availability of in person appointments. The patient expressed understanding and verbally consented to this telephonic visit.    Interactive audio and video telecommunications were attempted between this provider and patient, however failed, due to patient having technical difficulties OR patient did not have access to video capability.  We continued and completed visit with audio only.  Some vital signs may be absent or patient reported.   Hearing/Vision screen  Hearing Screening   125Hz  250Hz  500Hz  1000Hz  2000Hz  3000Hz  4000Hz  6000Hz   8000Hz   Right ear:           Left ear:           Comments: Patient is able to hear conversational tones without difficulty.  No issues reported.  Vision Screening Comments: Followed by [facility] Cataract extraction, bilateral Visual acuity not assessed, virtual visit.  They have seen their ophthalmologist in the last 12 months.   Dietary issues and exercise activities discussed: Current Exercise Habits: Home exercise routine, Type of exercise: walking (Parkinson's exercises), Intensity: Mild  Healthy diet Good fluid intake  Goals      Follow up with Primary Care Provider     Follow up as needed Read more aloud Continue parkinson exercises      Increase physical activity     Walk daily for exercise  Passive bike riding       Depression Screen PHQ 2/9 Scores 06/22/2020 06/16/2019 10/15/2018 03/06/2018 02/02/2018  PHQ - 2 Score 1 1 0 3 6  PHQ- 9 Score - - - 15 22    Fall Risk Fall Risk  06/22/2020 06/16/2019 01/20/2019 11/09/2018 10/27/2018  Falls in the past year? 0 0 0 (No Data) 0  Comment - - - Denies new/ recent falls today Denies new/ recent falls  Number falls in past yr: 0 - 0 - 0  Comment - - - - Denies falls over last year  Injury with Fall? 0 - 0 - 0  Comment - - - - N/A  Risk Factor Category  - - - - -  Risk for fall due to : - - - - Other (Comment);Medication side effect  Risk for fall due to: Comment - - - - Neuropathy  Follow up Falls evaluation completed - Falls evaluation completed Falls prevention discussed -   Handrails in use when climbing stairs? Yes  Home free of loose throw rugs in walkways, pet beds, electrical cords, etc? Yes  Adequate lighting in your home to reduce risk of falls? Yes   ASSISTIVE DEVICES UTILIZED TO PREVENT FALLS:  Life alert? No   20 hours daycare/caregiver service -Yes Use of a cane, walker or w/c? Yes   Grab bars in the bathroom? Yes  Shower chair or bench in shower? Yes  Elevated toilet seat or a handicapped  toilet? Yes   TIMED UP AND GO: Was the test performed? No . Virtual visit.  Cognitive Function: MMSE - Mini Mental State Exam 06/22/2020  Not completed: Unable to complete   [facility] Cognitive Assessment  12/16/2017  Visuospatial/ Executive (0/5) 3  Naming (0/3) 2  Attention: Read list of digits (0/2) 2  Attention: Read list of letters (0/1) 1  Attention: Serial 7 subtraction starting at 100 (0/3) 1  Language: Repeat phrase (0/2) 2  Language : Fluency (0/1) 1  Abstraction (0/2) 1  Delayed Recall (0/5) 4  Orientation (0/6) 6  Total 23  Adjusted Score (based on education) 23   6CIT Screen 06/16/2019  What Year? 0 points  What month? 0 points  What time? 0 points  Count back from 20 0 points  Months in reverse 0 points    Immunizations Immunization History  Administered Date(s) Administered   Influenza, High Dose Seasonal PF 09/11/2018   Moderna SARS-COVID-2 Vaccination 01/22/2020, 02/19/2020   Tdap 06/24/2014   Health Maintenance Health Maintenance  Topic Date Due   Hepatitis C Screening  Never done   DEXA SCAN  Never done   PNA vac Low Risk Adult (1 of 2 - PCV13) Never done   INFLUENZA VACCINE  06/25/2020   TETANUS/TDAP  06/24/2024   COLONOSCOPY  06/19/2025   COVID-19 Vaccine  Completed   Hepatitis C Screening- deferred per patient preference.   Dexa Scan- deferred per patient preference.   Dental Screening: Recommended annual dental exams for proper oral hygiene. Visits every 6 months.   Community Resource Referral / Chronic Care Management: CRR required this visit?  Yes   CCM required this visit?  Yes     Plan:   Keep all routine maintenance appointments.   Cpe 06/29/20 @ 10:30  I have personally reviewed and noted the following in the patient's chart:   Medical and social history Use of alcohol, tobacco or illicit drugs  Current medications and supplements Functional ability and status Nutritional status Physical activity Advanced  directives List of other physicians Hospitalizations, surgeries, and ER visits in previous 12 months Vitals Screenings to include cognitive, depression, and falls Referrals and appointments  In addition, I have reviewed and discussed with patient certain preventive protocols, quality metrics, and best practice recommendations. A written personalized care plan for preventive services as well as general preventive health recommendations were provided to patient via mychart.     OBrien-Blaney, Nas Wafer L, LPN   01/08/864    I have reviewed the above information and agree with above.   Deborra Medina, MD

## 2020-06-26 ENCOUNTER — Ambulatory Visit: Payer: PPO | Admitting: Physical Therapy

## 2020-06-29 ENCOUNTER — Ambulatory Visit (INDEPENDENT_AMBULATORY_CARE_PROVIDER_SITE_OTHER): Payer: PPO | Admitting: Internal Medicine

## 2020-06-29 ENCOUNTER — Encounter: Payer: Self-pay | Admitting: Internal Medicine

## 2020-06-29 ENCOUNTER — Other Ambulatory Visit: Payer: Self-pay

## 2020-06-29 VITALS — BP 122/84 | HR 78 | Temp 97.5°F | Resp 15 | Ht 66.0 in | Wt 170.2 lb

## 2020-06-29 DIAGNOSIS — R7303 Prediabetes: Secondary | ICD-10-CM

## 2020-06-29 DIAGNOSIS — R635 Abnormal weight gain: Secondary | ICD-10-CM | POA: Diagnosis not present

## 2020-06-29 DIAGNOSIS — E7800 Pure hypercholesterolemia, unspecified: Secondary | ICD-10-CM

## 2020-06-29 DIAGNOSIS — R29898 Other symptoms and signs involving the musculoskeletal system: Secondary | ICD-10-CM | POA: Diagnosis not present

## 2020-06-29 DIAGNOSIS — Z1231 Encounter for screening mammogram for malignant neoplasm of breast: Secondary | ICD-10-CM

## 2020-06-29 DIAGNOSIS — Z0000 Encounter for general adult medical examination without abnormal findings: Secondary | ICD-10-CM | POA: Diagnosis not present

## 2020-06-29 DIAGNOSIS — Z780 Asymptomatic menopausal state: Secondary | ICD-10-CM

## 2020-06-29 DIAGNOSIS — R5383 Other fatigue: Secondary | ICD-10-CM | POA: Diagnosis not present

## 2020-06-29 DIAGNOSIS — G20 Parkinson's disease: Secondary | ICD-10-CM

## 2020-06-29 DIAGNOSIS — G20A1 Parkinson's disease without dyskinesia, without mention of fluctuations: Secondary | ICD-10-CM

## 2020-06-29 LAB — CBC WITH DIFFERENTIAL/PLATELET
Basophils Absolute: 0 10*3/uL (ref 0.0–0.1)
Basophils Relative: 0.7 % (ref 0.0–3.0)
Eosinophils Absolute: 0.1 10*3/uL (ref 0.0–0.7)
Eosinophils Relative: 2 % (ref 0.0–5.0)
HCT: 38.6 % (ref 36.0–46.0)
Hemoglobin: 13.1 g/dL (ref 12.0–15.0)
Lymphocytes Relative: 37.8 % (ref 12.0–46.0)
Lymphs Abs: 2.4 10*3/uL (ref 0.7–4.0)
MCHC: 34 g/dL (ref 30.0–36.0)
MCV: 90.5 fl (ref 78.0–100.0)
Monocytes Absolute: 0.6 10*3/uL (ref 0.1–1.0)
Monocytes Relative: 9.1 % (ref 3.0–12.0)
Neutro Abs: 3.2 10*3/uL (ref 1.4–7.7)
Neutrophils Relative %: 50.4 % (ref 43.0–77.0)
Platelets: 222 10*3/uL (ref 150.0–400.0)
RBC: 4.26 Mil/uL (ref 3.87–5.11)
RDW: 14.7 % (ref 11.5–15.5)
WBC: 6.4 10*3/uL (ref 4.0–10.5)

## 2020-06-29 LAB — COMPREHENSIVE METABOLIC PANEL
ALT: 9 U/L (ref 0–35)
AST: 18 U/L (ref 0–37)
Albumin: 4.5 g/dL (ref 3.5–5.2)
Alkaline Phosphatase: 56 U/L (ref 39–117)
BUN: 14 mg/dL (ref 6–23)
CO2: 32 mEq/L (ref 19–32)
Calcium: 9.8 mg/dL (ref 8.4–10.5)
Chloride: 103 mEq/L (ref 96–112)
Creatinine, Ser: 0.77 mg/dL (ref 0.40–1.20)
GFR: 72.89 mL/min (ref >60.00)
Glucose, Bld: 87 mg/dL (ref 70–99)
Potassium: 4.6 mEq/L (ref 3.5–5.1)
Sodium: 139 mEq/L (ref 135–145)
Total Bilirubin: 0.3 mg/dL (ref 0.2–1.2)
Total Protein: 7.1 g/dL (ref 6.0–8.3)

## 2020-06-29 LAB — LIPID PANEL
Cholesterol: 228 mg/dL — ABNORMAL HIGH (ref 0–200)
HDL: 73.9 mg/dL (ref >39.00)
NonHDL: 153.97
Total CHOL/HDL Ratio: 3
Triglycerides: 345 mg/dL — ABNORMAL HIGH (ref 0.0–149.0)
VLDL: 69 mg/dL — ABNORMAL HIGH (ref 0.0–40.0)

## 2020-06-29 LAB — MICROALBUMIN / CREATININE URINE RATIO
Creatinine,U: 124.8 mg/dL
Microalb Creat Ratio: 0.6 mg/g (ref 0.0–30.0)
Microalb, Ur: 0.7 mg/dL (ref 0.0–1.9)

## 2020-06-29 LAB — LDL CHOLESTEROL, DIRECT: Direct LDL: 108 mg/dL

## 2020-06-29 LAB — HEMOGLOBIN A1C: Hgb A1c MFr Bld: 6.1 % (ref 4.6–6.5)

## 2020-06-29 LAB — TSH: TSH: 3.75 u[IU]/mL (ref 0.35–4.50)

## 2020-06-29 MED ORDER — ESTRADIOL 0.1 MG/GM VA CREA: 1.0000 | TOPICAL_CREAM | VAGINAL | 11 refills | Status: DC

## 2020-06-29 NOTE — Progress Notes (Signed)
Patient ID: Deborah Velazquez, female    DOB: August 29, 1946  Age: 74 y.o. MRN: [id number]  The patient is here for annual preventive examination and management of other chronic and acute problems.  This visit occurred during the SARS-CoV-2 public health emergency.  Safety protocols were in place, including screening questions prior to the visit, additional usage of staff PPE, and extensive cleaning of exam room while observing appropriate contact time as indicated for disinfecting solutions.   She is accompanied by two caregivers. (not her husband)   The risk factors are reflected in the social history.  The roster of all physicians providing medical care to patient - is listed in the Snapshot section of the chart.  Activities of daily living:  The patient is 100% independent in all ADLs: dressing, toileting, feeding as well as independent mobility  Home safety : The patient has smoke detectors in the home. They wear seatbelts.  There are no firearms at home. There is no violence in the home.   There is no risks for hepatitis, STDs or HIV. There is no   history of blood transfusion. They have no travel history to infectious disease endemic areas of the world.  The patient has seen their dentist in the last six month. They have seen their eye doctor in the last year. They admit to slight hearing difficulty with regard to whispered voices and some television programs.  They have deferred audiologic testing in the last year.  They do not  have excessive sun exposure. Discussed the need for sun protection: hats, long sleeves and use of sunscreen if there is significant sun exposure.   Diet: the importance of a healthy diet is discussed. They do have a healthy diet.  The benefits of regular aerobic exercise were discussed. She has limited mobility due to Parkinson's Disease complicated by severe anxiety.   Depression screen: there areno signs or vegative symptoms of depression, and patient is under the  care of a psychiatrist   Cognitive assessment: the patient's husband  manages all their financial and personal affairs and is actively engaged. They could relate day,date,year and events.   The following portions of the patient's history were reviewed and updated as appropriate: allergies, current medications, past family history, past medical history,  past surgical history, past social history  and problem list.  Visual acuity was not assessed per patient preference since she has regular follow up with her ophthalmologist. Hearing and body mass index were assessed and reviewed.   During the course of the visit the patient was educated and counseled about appropriate screening and preventive services including : fall prevention , diabetes screening, nutrition counseling, colorectal cancer screening, and recommended immunizations.    CC: The primary encounter diagnosis was Encounter for preventive health examination. Diagnoses of Prediabetes, Pure hypercholesterolemia, Weight gain, Fatigue, unspecified type, Parkinson's disease ([place]), Proximal leg weakness, Breast cancer screening by mammogram, and Postmenopausal estrogen deficiency were also pertinent to this visit.  :  Requested a second opinion last year,  Now seeing Deborah Velazquez at [facility] and his PA Deborah Velazquez .  Fatigue and hypersomnia.  Has had to take a break from her previous exercise regimen due to cataract surgery. Caregivers note increased restlessness starting about 45 mintues prior to her next dose of carbidopa.  Needs a recubent bike.  Anxiety.  Seeing Dr. Nicolasa Velazquez  For follow up next week.  Still very anxious feels very restless for 45 minutes before the next dose of Sinemet. Turnover in caregivers has  been  Distressing .  There have been 31 changes in personnel regarding the health aides procured through an agency that accepts her longterm care insurance plan ([facility])  Stools are quite large per husband and he has requested a medication to  make them smaller because they are reportedly Clogging toilet.  She has a BM every other day.  Firm  , logs   . Family caregiver opines that the toilet is very old has a smaller exit opening and is need of upgrading to a higher height as well one that is quipped with side safety bars    History Deborah Velazquez has a past medical history of Anxiety, Arthritis, Hypercholesterolemia, Hyperlipemia, Irritable bowel syndrome, Parkinson disease ([place]), Restless leg syndrome, RSD (reflex sympathetic dystrophy), and Stroke ([place]).   She has a past surgical history that includes Appendectomy; Colonoscopy; Colonoscopy with propofol (N/A, 06/20/2015); Breast biopsy (Left, 08/16/2013); Cataract extraction (Left); Pars plana vitrectomy (Left, 2010); Eye surgery; Back surgery; Fracture surgery; and Cataract extraction w/PHACO (Right, 06/01/2020).   Her family history includes Alzheimer's disease in her father; Breast cancer (age of onset: 55) in her mother; Cancer in her mother; Diabetes in her mother; Heart disease in her maternal aunt and mother.She reports that she has never smoked. She has never used smokeless tobacco. She reports current alcohol use. She reports that she does not use drugs.  Outpatient Medications Prior to Visit  Medication Sig Dispense Refill  . aspirin EC 81 MG tablet Take 81 mg by mouth daily. Swallow whole.    Marland Kitchen atorvastatin (LIPITOR) 10 MG tablet TAKE 1 TABLET(10 MG) BY MOUTH AT BEDTIME (Patient taking differently: Take 10 mg by mouth every evening. TAKE 1 TABLET(10 MG) BY MOUTH AT BEDTIME) 90 tablet 0  . carbidopa-levodopa (SINEMET CR) 50-200 MG tablet Take 1 tablet by mouth at bedtime.    . carbidopa-levodopa (SINEMET IR) 25-100 MG tablet 2 in the morning, 1 in the afternoon, 1 in the evening (Patient taking differently: Take 1 tablet by mouth 3 (three) times daily. 1 in the morning, 1 in the afternoon, 1 in the evening; 8 am, 1pm, 6pm) 360 tablet 1  . Coenzyme Q10 (COQ-10) 100 MG CAPS Take 1  capsule by mouth daily.    . DULoxetine (CYMBALTA) 60 MG capsule Take 60 mg by mouth daily.     Marland Kitchen OVER THE COUNTER MEDICATION Take 1 capsule by mouth in the morning and at bedtime. Doterra Life Long Vitality Pack    . pregabalin (LYRICA) 150 MG capsule Take 150 mg by mouth 2 (two) times daily.     . sertraline (ZOLOFT) 100 MG tablet Take 200 mg by mouth daily.     Marland Kitchen estradiol (ESTRACE) 0.1 MG/GM vaginal cream Place 1 Applicatorful vaginally at bedtime. For 2 weeks,  Then twice weekly thereafter (Patient taking differently: Place 1 Applicatorful vaginally 2 (two) times a week. ) 42.5 g 12   No facility-administered medications prior to visit.    Review of Systems   Patient denies headache, fevers, malaise, unintentional weight loss, skin rash, eye pain, sinus congestion and sinus pain, sore throat, dysphagia,  hemoptysis , cough, dyspnea, wheezing, chest pain, palpitations, orthopnea, edema, abdominal pain, nausea, melena, diarrhea, constipation, flank pain, dysuria, hematuria, urinary  Frequency, nocturia, numbness, tingling, seizures,  Focal weakness, Loss of consciousness,  Tremor, insomnia, , and suicidal ideation.      Objective:  BP 122/84 (BP Location: Left Arm, Patient Position: Sitting, Cuff Size: Normal)   Pulse 78   Temp Marland Kitchen)  97.5 F (36.4 C) (Oral)   Resp 15   Ht 5\' 6"  (1.676 m)   Wt 170 lb 3.2 oz (77.2 kg)   SpO2 97%   BMI 27.47 kg/m   Physical Exam  General appearance: alert, cooperative and appears older than  stated age Ears: normal TM's and external ear canals both ears Throat: lips, mucosa, and tongue normal; teeth and gums normal Neck: no adenopathy, no carotid bruit, supple, symmetrical, trachea midline and thyroid not enlarged, symmetric, no tenderness/mass/nodules Back: symmetric, no curvature. ROM normal. No CVA tenderness. Lungs: clear to auscultation bilaterally Heart: regular rate and rhythm, S1, S2 normal, no murmur, click, rub or gallop Abdomen: soft,  non-tender; bowel sounds normal; no masses,  no organomegaly Pulses: 2+ and symmetric Skin: Skin color, texture, turgor normal. No rashes or lesions Lymph nodes: Cervical, supraclavicular, and axillary nodes normal. Gait: shuffling,  Narrow  based Psych:  Flat affect.  Masked facies.     Assessment & Plan:   Problem List Items Addressed This Visit      Unprioritized   Encounter for preventive health examination - Primary    age appropriate education and counseling updated, referrals for preventative services and immunizations addressed, dietary and smoking counseling addressed, most recent labs reviewed.  I have personally reviewed and have noted:  1) the patient's medical and social history 2) The pt's use of alcohol, tobacco, and illicit drugs 3) The patient's current medications and supplements 4) Functional ability including ADL's, fall risk, home safety risk, hearing and visual impairment 5) Diet and physical activities 6) Evidence for depression or mood disorder 7) The patient's height, weight, and BMI have been recorded in the chart  I have made referrals, and provided counseling and education based on review of the above      Parkinson's disease ([place])   Relevant Orders   For home use only DME Other see comment   For home use only DME Other see comment   Prediabetes    With significant weight gain noted and BMU now 27 . I have addressed  A1c and recommended adhering to a  a low glycemic index diet and regular exercise a minimum of 5 days per week.    Lab Results  Component Value Date   HGBA1C 6.1 06/29/2020         Relevant Orders   Comprehensive metabolic panel (Completed)   Hemoglobin A1c (Completed)   Microalbumin / creatinine urine ratio (Completed)   Proximal leg weakness    DMES for recumbent bike and comfort level commode with side rails written       Relevant Orders   For home use only DME Other see comment   For home use only DME Other see  comment   Pure hypercholesterolemia    LDL is at goal.  However, triiglycerides were elevated today to over 300.  Advised to follow a  Low glycemic index diet, maintain a BMI < 27 and participate in  regular exercise.  WE should repeat the labs in  6 months.     Lab Results  Component Value Date   CHOL 228 (H) 06/29/2020   HDL 73.90 06/29/2020   LDLCALC 81 07/09/2018   LDLDIRECT 108.0 06/29/2020   TRIG 345.0 (H) 06/29/2020   CHOLHDL 3 06/29/2020         Relevant Orders   Lipid panel (Completed)    Other Visit Diagnoses    Weight gain       Relevant Orders   TSH (  Completed)   Fatigue, unspecified type       Relevant Orders   CBC with Differential/Platelet (Completed)   Breast cancer screening by mammogram       Relevant Orders   MM 3D SCREEN BREAST BILATERAL   Postmenopausal estrogen deficiency       Relevant Orders   DG Bone Density      I have changed Peyson G. Butner's estradiol. I am also having her maintain her DULoxetine, carbidopa-levodopa, sertraline, pregabalin, atorvastatin, carbidopa-levodopa, aspirin EC, OVER THE COUNTER MEDICATION, and CoQ-10.  Meds ordered this encounter  Medications  . estradiol (ESTRACE) 0.1 MG/GM vaginal cream    Sig: Place 1 Applicatorful vaginally 2 (two) times a week.    Dispense:  80 g    Refill:  11    Please change size to largest available tube    Medications Discontinued During This Encounter  Medication Reason  . estradiol (ESTRACE) 0.1 MG/GM vaginal cream     Follow-up: Return in about 6 months (around 12/30/2020).   [name], MD

## 2020-06-29 NOTE — Patient Instructions (Addendum)
Deborah Velazquez,  There is no medication for large stools,  Only small stools!  Please replace the toilet with a more modern height and "throat"   Remind  [name] to flush as soon as she sits down to help prevent clogging.    She needs a recumbent bike to manage the anxiety during the periods when she is restless and waiting for her next dose of medication

## 2020-07-01 DIAGNOSIS — R29898 Other symptoms and signs involving the musculoskeletal system: Secondary | ICD-10-CM | POA: Insufficient documentation

## 2020-07-01 DIAGNOSIS — Z0000 Encounter for general adult medical examination without abnormal findings: Secondary | ICD-10-CM | POA: Insufficient documentation

## 2020-07-01 DIAGNOSIS — Z515 Encounter for palliative care: Secondary | ICD-10-CM | POA: Insufficient documentation

## 2020-07-01 NOTE — Assessment & Plan Note (Addendum)
With significant weight gain noted and BMU now 27 . I have addressed  A1c and recommended adhering to a  a low glycemic index diet and regular exercise a minimum of 5 days per week.    Lab Results  Component Value Date   HGBA1C 6.1 06/29/2020

## 2020-07-01 NOTE — Assessment & Plan Note (Signed)
DMES for recumbent bike and comfort level commode with side rails written

## 2020-07-01 NOTE — Assessment & Plan Note (Signed)

## 2020-07-01 NOTE — Assessment & Plan Note (Signed)
LDL is at goal.  However, triiglycerides were elevated today to over 300.  Advised to follow a  Low glycemic index diet, maintain a BMI < 27 and participate in  regular exercise.  WE should repeat the labs in  6 months.     Lab Results  Component Value Date   CHOL 228 (H) 06/29/2020   HDL 73.90 06/29/2020   LDLCALC 81 07/09/2018   LDLDIRECT 108.0 06/29/2020   TRIG 345.0 (H) 06/29/2020   CHOLHDL 3 06/29/2020

## 2020-07-03 ENCOUNTER — Ambulatory Visit: Payer: PPO | Admitting: Physical Therapy

## 2020-07-06 DIAGNOSIS — G4700 Insomnia, unspecified: Secondary | ICD-10-CM | POA: Diagnosis not present

## 2020-07-06 DIAGNOSIS — F411 Generalized anxiety disorder: Secondary | ICD-10-CM | POA: Diagnosis not present

## 2020-07-06 DIAGNOSIS — F4321 Adjustment disorder with depressed mood: Secondary | ICD-10-CM | POA: Diagnosis not present

## 2020-07-10 ENCOUNTER — Ambulatory Visit: Payer: PPO | Admitting: Physical Therapy

## 2020-07-14 ENCOUNTER — Telehealth: Payer: Self-pay | Admitting: Internal Medicine

## 2020-07-14 MED ORDER — ATORVASTATIN CALCIUM 10 MG PO TABS: ORAL_TABLET | ORAL | 3 refills | Status: DC

## 2020-07-14 NOTE — Telephone Encounter (Signed)
Medication has been refilled.

## 2020-07-14 NOTE — Telephone Encounter (Signed)
Patient needs a refill on her atorvastatin (LIPITOR) 10 MG tablet

## 2020-07-17 ENCOUNTER — Ambulatory Visit: Payer: PPO | Admitting: Physical Therapy

## 2020-07-24 ENCOUNTER — Ambulatory Visit: Payer: PPO | Admitting: Physical Therapy

## 2020-08-07 ENCOUNTER — Ambulatory Visit: Payer: PPO | Admitting: Physical Therapy

## 2020-08-14 ENCOUNTER — Ambulatory Visit: Payer: PPO | Admitting: Physical Therapy

## 2020-08-21 ENCOUNTER — Ambulatory Visit: Payer: PPO | Admitting: Physical Therapy

## 2020-08-24 DIAGNOSIS — G4700 Insomnia, unspecified: Secondary | ICD-10-CM | POA: Diagnosis not present

## 2020-08-24 DIAGNOSIS — F411 Generalized anxiety disorder: Secondary | ICD-10-CM | POA: Diagnosis not present

## 2020-08-24 DIAGNOSIS — F4321 Adjustment disorder with depressed mood: Secondary | ICD-10-CM | POA: Diagnosis not present

## 2020-08-28 ENCOUNTER — Ambulatory Visit: Payer: PPO | Admitting: Physical Therapy

## 2020-08-30 DIAGNOSIS — G5793 Unspecified mononeuropathy of bilateral lower limbs: Secondary | ICD-10-CM | POA: Diagnosis not present

## 2020-08-30 DIAGNOSIS — G20 Parkinson's disease: Secondary | ICD-10-CM | POA: Diagnosis not present

## 2020-09-07 DIAGNOSIS — G20 Parkinson's disease: Secondary | ICD-10-CM | POA: Diagnosis not present

## 2020-09-07 DIAGNOSIS — G5793 Unspecified mononeuropathy of bilateral lower limbs: Secondary | ICD-10-CM | POA: Diagnosis not present

## 2020-09-07 DIAGNOSIS — G4752 REM sleep behavior disorder: Secondary | ICD-10-CM | POA: Diagnosis not present

## 2020-09-29 DIAGNOSIS — F4321 Adjustment disorder with depressed mood: Secondary | ICD-10-CM | POA: Diagnosis not present

## 2020-09-29 DIAGNOSIS — F411 Generalized anxiety disorder: Secondary | ICD-10-CM | POA: Diagnosis not present

## 2020-09-29 DIAGNOSIS — G4700 Insomnia, unspecified: Secondary | ICD-10-CM | POA: Diagnosis not present

## 2020-10-17 ENCOUNTER — Telehealth: Payer: Self-pay

## 2020-10-17 DIAGNOSIS — R5383 Other fatigue: Secondary | ICD-10-CM

## 2020-10-17 NOTE — Telephone Encounter (Signed)
[name] called and said pt has Parkinson's disease and Dr. Nicolasa Ducking told her pt needs a full blood panel and a urinalysis done. She pt has been sleeping 20 hours out of the day and is very disoriented. She said she needs to get this scheduled along with a visit with Dr. Derrel Nip. The only appointments available are Dr. Lupita Dawn covid appt until Dec 28th. Please advise. Also [name] is not on pt's DPR and I let her know we need to update form so we are allowed to speak with her. Vaughan Basta is asking for [place] to call her back [phone] since pt's husband is hard of hearing and she normally takes care of patient.

## 2020-10-17 NOTE — Telephone Encounter (Signed)
Non fasting labs ordered.  Need labs prior to appt,  Can use a covid slot

## 2020-10-18 NOTE — Telephone Encounter (Signed)
Spoke with Vaughan Basta and scheduled the appts for lab and to see Dr. Derrel Nip. Also advised that when they come in they need to fill out a new DPR. Vaughan Basta gave a verbal understanding.

## 2020-10-23 ENCOUNTER — Other Ambulatory Visit: Payer: Self-pay

## 2020-10-23 ENCOUNTER — Other Ambulatory Visit (INDEPENDENT_AMBULATORY_CARE_PROVIDER_SITE_OTHER): Payer: PPO

## 2020-10-23 DIAGNOSIS — R5383 Other fatigue: Secondary | ICD-10-CM

## 2020-10-23 LAB — URINALYSIS, ROUTINE W REFLEX MICROSCOPIC
Bilirubin Urine: NEGATIVE
Hgb urine dipstick: NEGATIVE
Ketones, ur: NEGATIVE
Leukocytes,Ua: NEGATIVE
Nitrite: NEGATIVE
RBC / HPF: NONE SEEN (ref 0–?)
Specific Gravity, Urine: 1.025 (ref 1.000–1.030)
Total Protein, Urine: NEGATIVE
Urine Glucose: NEGATIVE
Urobilinogen, UA: 0.2 (ref 0.0–1.0)
pH: 6 (ref 5.0–8.0)

## 2020-10-23 LAB — CBC WITH DIFFERENTIAL/PLATELET
Basophils Absolute: 0.1 10*3/uL (ref 0.0–0.1)
Basophils Relative: 0.7 % (ref 0.0–3.0)
Eosinophils Absolute: 0.1 10*3/uL (ref 0.0–0.7)
Eosinophils Relative: 1.6 % (ref 0.0–5.0)
HCT: 41.4 % (ref 36.0–46.0)
Hemoglobin: 13.9 g/dL (ref 12.0–15.0)
Lymphocytes Relative: 21.6 % (ref 12.0–46.0)
Lymphs Abs: 1.6 10*3/uL (ref 0.7–4.0)
MCHC: 33.5 g/dL (ref 30.0–36.0)
MCV: 89.3 fl (ref 78.0–100.0)
Monocytes Absolute: 0.8 10*3/uL (ref 0.1–1.0)
Monocytes Relative: 10.4 % (ref 3.0–12.0)
Neutro Abs: 4.9 10*3/uL (ref 1.4–7.7)
Neutrophils Relative %: 65.7 % (ref 43.0–77.0)
Platelets: 216 10*3/uL (ref 150.0–400.0)
RBC: 4.64 Mil/uL (ref 3.87–5.11)
RDW: 14.5 % (ref 11.5–15.5)
WBC: 7.4 10*3/uL (ref 4.0–10.5)

## 2020-10-23 LAB — COMPREHENSIVE METABOLIC PANEL
ALT: 10 U/L (ref 0–35)
AST: 17 U/L (ref 0–37)
Albumin: 4.6 g/dL (ref 3.5–5.2)
Alkaline Phosphatase: 52 U/L (ref 39–117)
BUN: 15 mg/dL (ref 6–23)
CO2: 31 mEq/L (ref 19–32)
Calcium: 9.7 mg/dL (ref 8.4–10.5)
Chloride: 101 mEq/L (ref 96–112)
Creatinine, Ser: 0.94 mg/dL (ref 0.40–1.20)
GFR: 59.03 mL/min — ABNORMAL LOW (ref >60.00)
Glucose, Bld: 112 mg/dL — ABNORMAL HIGH (ref 70–99)
Potassium: 4.6 mEq/L (ref 3.5–5.1)
Sodium: 139 mEq/L (ref 135–145)
Total Bilirubin: 0.3 mg/dL (ref 0.2–1.2)
Total Protein: 7.6 g/dL (ref 6.0–8.3)

## 2020-10-23 LAB — HEMOGLOBIN A1C: Hgb A1c MFr Bld: 6.3 % (ref 4.6–6.5)

## 2020-10-24 LAB — THYROID PANEL WITH TSH
Free Thyroxine Index: 1.9 (ref 1.4–3.8)
T3 Uptake: 30 % (ref 22–35)
T4, Total: 6.4 ug/dL (ref 5.1–11.9)
TSH: 3.55 mIU/L (ref 0.40–4.50)

## 2020-10-24 LAB — URINE CULTURE
MICRO NUMBER:: 11252008
Result:: NO GROWTH
SPECIMEN QUALITY:: ADEQUATE

## 2020-10-25 ENCOUNTER — Ambulatory Visit (INDEPENDENT_AMBULATORY_CARE_PROVIDER_SITE_OTHER): Payer: PPO | Admitting: Internal Medicine

## 2020-10-25 ENCOUNTER — Encounter: Payer: Self-pay | Admitting: Internal Medicine

## 2020-10-25 ENCOUNTER — Other Ambulatory Visit: Payer: Self-pay

## 2020-10-25 DIAGNOSIS — F419 Anxiety disorder, unspecified: Secondary | ICD-10-CM

## 2020-10-25 DIAGNOSIS — R7303 Prediabetes: Secondary | ICD-10-CM

## 2020-10-25 DIAGNOSIS — G20 Parkinson's disease: Secondary | ICD-10-CM | POA: Diagnosis not present

## 2020-10-25 DIAGNOSIS — R5383 Other fatigue: Secondary | ICD-10-CM | POA: Diagnosis not present

## 2020-10-25 DIAGNOSIS — G629 Polyneuropathy, unspecified: Secondary | ICD-10-CM

## 2020-10-25 NOTE — Progress Notes (Signed)
Subjective:  Patient ID: Deborah Velazquez, female    DOB: July 21, 1946  Age: 74 y.o. MRN: [id number]  CC: Diagnoses of Prediabetes, Anxiety, Parkinson's disease ([place]), Neuropathy, and Lethargy were pertinent to this visit.  HPI Deborah Velazquez presents for FOLLOW UP ON CHRONIC ISSUES including depression, parkinson's disease with new onset dementia.   This visit occurred during the SARS-CoV-2 public health emergency.  Safety protocols were in place, including screening questions prior to the visit, additional usage of staff PPE, and extensive cleaning of exam room while observing appropriate contact time as indicated for disinfecting solutions.   Deborah Velazquez is a 74 yr old female who was diagnosed with Parkinson's disease in 2017.  She has had considerable difficulty accepting the diagnosis, resulting in several neurology referrals for 2nd and 3 rd opinions,  Followed by profound depression complicated by anxiety and polyneuropathy   Her anxiety became disabling, requiring psychiatric referral, multiple medication trials and increased need for assistance at hone .  She recently stopped her cymbalta which resulted in profound depression.  She lives with her husband Deborah Velazquez and has 3 shifts of caregivers providing  Has 24/7 care and companionship.  She ambulates with a walker.  She has had significant caregiver burnout/turnover, and is currently " On her 50th caregiver" (19 more since August) due to personality changes resulting in patient being verbally abusive.   Seeing Deborah Velazquez for depression,  cymbalta restarted,  A change is expected by Christmas.  Patient states that she wants to die but is not suicidal.  Very lethargic except when eating meals per aide.  She has fallen asleep twice during today's visit. .      Outpatient Medications Prior to Visit  Medication Sig Dispense Refill  . aspirin EC 81 MG tablet Take 81 mg by mouth daily. Swallow whole.    Marland Kitchen atorvastatin (LIPITOR) 10 MG tablet TAKE 1 TABLET(10  MG) BY MOUTH AT BEDTIME 90 tablet 3  . carbidopa-levodopa (SINEMET CR) 50-200 MG tablet Take 1 tablet by mouth at bedtime. Take one tablet at 2p & one at 5:30p    . carbidopa-levodopa (SINEMET IR) 25-100 MG tablet 2 in the morning, 1 in the afternoon, 1 in the evening (Patient taking differently: Take 1 tablet by mouth in the morning and at bedtime. Take one in the morning at 7 & one at 10:30.) 360 tablet 1  . DULoxetine (CYMBALTA) 60 MG capsule Take 60 mg by mouth daily.     Marland Kitchen estradiol (ESTRACE) 0.1 MG/GM vaginal cream Place 1 Applicatorful vaginally 2 (two) times a week. 80 g 11  . hydrOXYzine (ATARAX/VISTARIL) 25 MG tablet Take 25 mg by mouth 2 (two) times daily as needed.    Marland Kitchen OVER THE COUNTER MEDICATION Take 1 capsule by mouth in the morning and at bedtime. Doterra Life Long Vitality Pack    . pregabalin (LYRICA) 150 MG capsule Take 150 mg by mouth 2 (two) times daily.     . sertraline (ZOLOFT) 100 MG tablet Take 200 mg by mouth daily.     . Coenzyme Q10 (COQ-10) 100 MG CAPS Take 1 capsule by mouth daily. (Patient not taking: Reported on 10/25/2020)     No facility-administered medications prior to visit.    Review of Systems;  Patient denies headache, fevers, malaise, unintentional weight loss, skin rash, eye pain, sinus congestion and sinus pain, sore throat, dysphagia,  hemoptysis , cough, dyspnea, wheezing, chest pain, palpitations, orthopnea, edema, abdominal pain, nausea, melena, diarrhea, constipation, flank pain, dysuria,  hematuria, urinary  Frequency, nocturia, numbness, tingling, seizures,  Focal weakness, Loss of consciousness,  Tremor, insomnia, depression, anxiety, and suicidal ideation.      Objective:  BP 110/68   Pulse (!) 51   Temp 97.9 F (36.6 C)   Ht 5\' 6"  (1.676 m)   Wt 169 lb (76.7 kg)   SpO2 99%   BMI 27.28 kg/m   BP Readings from Last 3 Encounters:  10/25/20 110/68  06/29/20 122/84  06/01/20 128/77    Wt Readings from Last 3 Encounters:  10/25/20 169  lb (76.7 kg)  06/29/20 170 lb 3.2 oz (77.2 kg)  06/22/20 166 lb (75.3 kg)    General appearance: lethargic,  Sullen,  appears older than stated age Ears: normal TM's and external ear canals both ears Throat: lips, mucosa, and tongue normal; teeth and gums normal Neck: no adenopathy, no carotid bruit, supple, symmetrical, trachea midline and thyroid not enlarged, symmetric, no tenderness/mass/nodules Back: symmetric, no curvature. ROM normal. No CVA tenderness. Lungs: clear to auscultation bilaterally Heart: regular rate and rhythm, S1, S2 normal, no murmur, click, rub or gallop Abdomen: soft, non-tender; bowel sounds normal; no masses,  no organomegaly Pulses: 2+ and symmetric Skin: Skin color, texture, turgor normal. No rashes or lesions Lymph nodes: Cervical, supraclavicular, and axillary nodes normal. Neuro:  awake and interactive with flat /depressed  mood and affect. Higher cortical functions are normal. Speech is clear without word-finding difficulty or dysarthria. Extraocular movements are intact. Visual fields of both eyes are grossly intact. Sensation to light touch is grossly intact bilaterally of upper and lower extremities.   Lab Results  Component Value Date   HGBA1C 6.3 10/23/2020   HGBA1C 6.1 06/29/2020   HGBA1C 5.7 11/05/2018    Lab Results  Component Value Date   CREATININE 0.94 10/23/2020   CREATININE 0.77 06/29/2020   CREATININE 0.74 11/05/2018    Lab Results  Component Value Date   WBC 7.4 10/23/2020   HGB 13.9 10/23/2020   HCT 41.4 10/23/2020   PLT 216.0 10/23/2020   GLUCOSE 112 (H) 10/23/2020   CHOL 228 (H) 06/29/2020   TRIG 345.0 (H) 06/29/2020   HDL 73.90 06/29/2020   LDLDIRECT 108.0 06/29/2020   LDLCALC 81 07/09/2018   ALT 10 10/23/2020   AST 17 10/23/2020   NA 139 10/23/2020   K 4.6 10/23/2020   CL 101 10/23/2020   CREATININE 0.94 10/23/2020   BUN 15 10/23/2020   CO2 31 10/23/2020   TSH 3.55 10/23/2020   HGBA1C 6.3 10/23/2020    MICROALBUR 0.7 06/29/2020    No results found.  Assessment & Plan:   Problem List Items Addressed This Visit      Unprioritized   Prediabetes    Stable by repeat A1c.  Continue low glycemic index diet       Parkinson's disease ([place])    Managed by Dr. Manuella Ghazi with Sinemet taken 4 times daily. She has a recumbent bike which she refuses to use,  And 24 hour care at home. She has a currently poor prognosis because of her concurrent psychiatric issues       Neuropathy    Confirmed with EMG studies done by Dr Manuella Ghazi.  Managed with Lyrica  By DrShah      Lethargy    Multifactorial,  But metabolic and infectious causes ruled out with labs/urine done nov 29. Polypharmacy and profound depression likely key contributors.       Anxiety    She is requesting "something for  anxiety" but is quite lethargic and , per aide,  Is verbally abusive to staff when not allowed to sleep all day long.  No medication changes were made today,  Deferred to Dr Deborah Velazquez her psychiatrist, who is treating with zoloft and cymbalta. A total of 40 minutes was spent with patient more than half of which was spent in counseling patient and encouraging her to focus less on her disability and the helplessness  she is nurturing and more on her blessings  In an effort to reduce her apparent resistance to feeling better        Relevant Medications   hydrOXYzine (ATARAX/VISTARIL) 25 MG tablet      I have discontinued Kelbie G. Medellin's CoQ-10. I am also having her maintain her DULoxetine, carbidopa-levodopa, sertraline, pregabalin, carbidopa-levodopa, aspirin EC, OVER THE COUNTER MEDICATION, estradiol, atorvastatin, and hydrOXYzine.  No orders of the defined types were placed in this encounter.   Medications Discontinued During This Encounter  Medication Reason  . Coenzyme Q10 (COQ-10) 100 MG CAPS Patient has not taken in last 30 days    Follow-up: No follow-ups on file.   [name], MD

## 2020-10-25 NOTE — Assessment & Plan Note (Signed)
Confirmed with EMG studies done by Dr Manuella Ghazi.  Managed with Lyrica  By [place]

## 2020-10-25 NOTE — Assessment & Plan Note (Addendum)
Multifactorial,  But metabolic and infectious causes ruled out with labs/urine done nov 29. Polypharmacy and profound depression likely key contributors.

## 2020-10-25 NOTE — Assessment & Plan Note (Addendum)
Managed by Dr. Manuella Ghazi with Sinemet taken 4 times daily. She has a recumbent bike which she refuses to use,  And 24 hour care at home. She has a currently poor prognosis because of her concurrent psychiatric issues

## 2020-10-25 NOTE — Assessment & Plan Note (Signed)
Stable by repeat A1c.  Continue low glycemic index diet

## 2020-10-25 NOTE — Patient Instructions (Signed)
All of your blood work was fine,  Except that you look a little dehydrated , so please drink more water   You have no signs of a urinary tract infection

## 2020-10-25 NOTE — Assessment & Plan Note (Addendum)
She is requesting "something for anxiety" but is quite lethargic and , per aide,  Is verbally abusive to staff when not allowed to sleep all day long.  No medication changes were made today,  Deferred to Dr Nicolasa Ducking her psychiatrist, who is treating with zoloft and cymbalta. A total of 40 minutes was spent with patient more than half of which was spent in counseling patient and encouraging her to focus less on her disability and the helplessness  she is nurturing and more on her blessings  In an effort to reduce her apparent resistance to feeling better

## 2020-10-26 DIAGNOSIS — G4700 Insomnia, unspecified: Secondary | ICD-10-CM | POA: Diagnosis not present

## 2020-10-26 DIAGNOSIS — F332 Major depressive disorder, recurrent severe without psychotic features: Secondary | ICD-10-CM | POA: Diagnosis not present

## 2020-10-26 DIAGNOSIS — F411 Generalized anxiety disorder: Secondary | ICD-10-CM | POA: Diagnosis not present

## 2020-11-09 DIAGNOSIS — F419 Anxiety disorder, unspecified: Secondary | ICD-10-CM | POA: Diagnosis not present

## 2020-11-09 DIAGNOSIS — G4752 REM sleep behavior disorder: Secondary | ICD-10-CM | POA: Diagnosis not present

## 2020-11-09 DIAGNOSIS — G20 Parkinson's disease: Secondary | ICD-10-CM | POA: Diagnosis not present

## 2020-11-09 DIAGNOSIS — F0280 Dementia in other diseases classified elsewhere without behavioral disturbance: Secondary | ICD-10-CM | POA: Diagnosis not present

## 2020-11-09 DIAGNOSIS — F411 Generalized anxiety disorder: Secondary | ICD-10-CM | POA: Diagnosis not present

## 2020-11-09 DIAGNOSIS — F5104 Psychophysiologic insomnia: Secondary | ICD-10-CM | POA: Diagnosis not present

## 2020-11-09 DIAGNOSIS — F332 Major depressive disorder, recurrent severe without psychotic features: Secondary | ICD-10-CM | POA: Diagnosis not present

## 2020-11-09 DIAGNOSIS — G4700 Insomnia, unspecified: Secondary | ICD-10-CM | POA: Diagnosis not present

## 2020-11-09 DIAGNOSIS — G5793 Unspecified mononeuropathy of bilateral lower limbs: Secondary | ICD-10-CM | POA: Diagnosis not present

## 2020-12-14 DIAGNOSIS — F411 Generalized anxiety disorder: Secondary | ICD-10-CM | POA: Diagnosis not present

## 2020-12-14 DIAGNOSIS — F332 Major depressive disorder, recurrent severe without psychotic features: Secondary | ICD-10-CM | POA: Diagnosis not present

## 2020-12-14 DIAGNOSIS — G4700 Insomnia, unspecified: Secondary | ICD-10-CM | POA: Diagnosis not present

## 2021-01-02 DIAGNOSIS — F332 Major depressive disorder, recurrent severe without psychotic features: Secondary | ICD-10-CM | POA: Diagnosis not present

## 2021-01-02 DIAGNOSIS — F411 Generalized anxiety disorder: Secondary | ICD-10-CM | POA: Diagnosis not present

## 2021-01-02 DIAGNOSIS — G4700 Insomnia, unspecified: Secondary | ICD-10-CM | POA: Diagnosis not present

## 2021-01-04 ENCOUNTER — Ambulatory Visit (INDEPENDENT_AMBULATORY_CARE_PROVIDER_SITE_OTHER): Payer: PPO | Admitting: Internal Medicine

## 2021-01-04 ENCOUNTER — Other Ambulatory Visit: Payer: Self-pay

## 2021-01-04 ENCOUNTER — Encounter: Payer: Self-pay | Admitting: Internal Medicine

## 2021-01-04 VITALS — BP 84/58 | HR 84 | Temp 97.2°F | Ht 65.98 in | Wt 172.2 lb

## 2021-01-04 DIAGNOSIS — B372 Candidiasis of skin and nail: Secondary | ICD-10-CM

## 2021-01-04 DIAGNOSIS — F419 Anxiety disorder, unspecified: Secondary | ICD-10-CM

## 2021-01-04 DIAGNOSIS — R300 Dysuria: Secondary | ICD-10-CM

## 2021-01-04 LAB — URINALYSIS, ROUTINE W REFLEX MICROSCOPIC
Bilirubin Urine: NEGATIVE
Hgb urine dipstick: NEGATIVE
Ketones, ur: NEGATIVE
Nitrite: NEGATIVE
RBC / HPF: NONE SEEN (ref 0–?)
Specific Gravity, Urine: 1.02 (ref 1.000–1.030)
Total Protein, Urine: NEGATIVE
Urine Glucose: NEGATIVE
Urobilinogen, UA: 0.2 (ref 0.0–1.0)
pH: 5.5 (ref 5.0–8.0)

## 2021-01-04 MED ORDER — NYSTATIN 100000 UNIT/GM EX POWD: 1.0000 "application " | Freq: Two times a day (BID) | CUTANEOUS | 0 refills | Status: DC

## 2021-01-04 NOTE — Progress Notes (Signed)
Subjective:  Patient ID: Deborah Velazquez, female    DOB: November 29, 1945  Age: 75 y.o. MRN: [id number]  CC: The primary encounter diagnosis was Dysuria. Diagnoses of Candidiasis of skin and Anxiety were also pertinent to this visit.  HPI Deborah Velazquez presents for follow up on multiple issues  Last seen Dec 1.  Accompanied by Deborah Velazquez and caregiver    This visit occurred during the SARS-CoV-2 public health emergency.  Safety protocols were in place, including screening questions prior to the visit, additional usage of staff PPE, and extensive cleaning of exam room while observing appropriate contact time as indicated for disinfecting solutions.   Depression/anxiety: Attitude is  improving with medication changes and changes in caregivers  Now on seroquel ,  [place] and no longer on abilify . She is more alert today and exhibiting a sense of humor.  She continues to feel sorry for herself and requires maximal assistance with ADL's due to weakness    Has an intermittent itch in skin folds with redness that Is noticeably  worse after the weekend bc the weekend aides don't dry her well after bathing her.   Mild dysuria,  Chronic,  Requesting UTI rule out    Outpatient Medications Prior to Visit  Medication Sig Dispense Refill  . aspirin EC 81 MG tablet Take 81 mg by mouth daily. Swallow whole.    Marland Kitchen atorvastatin (LIPITOR) 10 MG tablet TAKE 1 TABLET(10 MG) BY MOUTH AT BEDTIME 90 tablet 3  . carbidopa-levodopa (SINEMET CR) 50-200 MG tablet Take 1 tablet by mouth at bedtime. Take one tablet at 2p & one at 5:30p    . carbidopa-levodopa (SINEMET IR) 25-100 MG tablet 2 in the morning, 1 in the afternoon, 1 in the evening (Patient taking differently: Take 1 tablet by mouth in the morning and at bedtime. Take one in the morning at 7 & one at 10:30.) 360 tablet 1  . DULoxetine (CYMBALTA) 60 MG capsule Take 60 mg by mouth daily.     Marland Kitchen estradiol (ESTRACE) 0.1 MG/GM vaginal cream Place 1 Applicatorful  vaginally 2 (two) times a week. 80 g 11  . hydrOXYzine (ATARAX/VISTARIL) 25 MG tablet Take 25 mg by mouth 2 (two) times daily as needed.    Marland Kitchen OVER THE COUNTER MEDICATION Take 1 capsule by mouth in the morning and at bedtime. Doterra Life Long Vitality Pack    . pregabalin (LYRICA) 150 MG capsule Take 150 mg by mouth 2 (two) times daily.     . QUEtiapine (SEROQUEL) 25 MG tablet Take 12.5 mg by mouth at bedtime.    Marland Kitchen rOPINIRole (REQUIP XL) 2 MG 24 hr tablet Take by mouth.    . sertraline (ZOLOFT) 100 MG tablet Take 200 mg by mouth daily.      No facility-administered medications prior to visit.    Review of Systems;  Patient denies headache, fevers, malaise, unintentional weight loss, skin rash, eye pain, sinus congestion and sinus pain, sore throat, dysphagia,  hemoptysis , cough, dyspnea, wheezing, chest pain, palpitations, orthopnea, edema, abdominal pain, nausea, melena, diarrhea, constipation, flank pain, dysuria, hematuria, urinary  Frequency, nocturia, numbness, tingling, seizures,  Focal weakness, Loss of consciousness,  Tremor, insomnia, depression, anxiety, and suicidal ideation.      Objective:  BP (!) 84/58 (BP Location: Left Arm, Patient Position: Sitting)   Pulse 84   Temp (!) 97.2 F (36.2 C)   Ht 5' 5.98" (1.676 m)   Wt 172 lb 3.2 oz (78.1 kg)  SpO2 90%   BMI 27.81 kg/m   BP Readings from Last 3 Encounters:  01/04/21 (!) 84/58  10/25/20 110/68  06/29/20 122/84    Wt Readings from Last 3 Encounters:  01/04/21 172 lb 3.2 oz (78.1 kg)  10/25/20 169 lb (76.7 kg)  06/29/20 170 lb 3.2 oz (77.2 kg)    General appearance: alert, cooperative and appears stated age Ears: normal TM's and external ear canals both ears Throat: lips, mucosa, and tongue normal; teeth and gums normal Neck: no adenopathy, no carotid bruit, supple, symmetrical, trachea midline and thyroid not enlarged, symmetric, no tenderness/mass/nodules Back: symmetric, no curvature. ROM normal. No CVA  tenderness. Lungs: clear to auscultation bilaterally Heart: regular rate and rhythm, S1, S2 normal, no murmur, click, rub or gallop Abdomen: soft, non-tender; bowel sounds normal; no masses,  no organomegaly Pulses: 2+ and symmetric Skin: erythematous folds of skin on abdominal wall.  Lymph nodes: Cervical, supraclavicular, and axillary nodes normal. Neuro:  awake and interactive with normal mood and affect. Higher cortical functions are normal. Speech is clear without word-finding difficulty or dysarthria. Extraocular movements are intact. Visual fields of both eyes are grossly intact. Sensation to light touch is grossly intact bilaterally of upper and lower extremities. Motor examination shows 4+/5 symmetric hand grip and upper extremity and 4/5 lower extremity strength.    Lab Results  Component Value Date   HGBA1C 6.3 10/23/2020   HGBA1C 6.1 06/29/2020   HGBA1C 5.7 11/05/2018    Lab Results  Component Value Date   CREATININE 0.94 10/23/2020   CREATININE 0.77 06/29/2020   CREATININE 0.74 11/05/2018    Lab Results  Component Value Date   WBC 7.4 10/23/2020   HGB 13.9 10/23/2020   HCT 41.4 10/23/2020   PLT 216.0 10/23/2020   GLUCOSE 112 (H) 10/23/2020   CHOL 228 (H) 06/29/2020   TRIG 345.0 (H) 06/29/2020   HDL 73.90 06/29/2020   LDLDIRECT 108.0 06/29/2020   LDLCALC 81 07/09/2018   ALT 10 10/23/2020   AST 17 10/23/2020   NA 139 10/23/2020   K 4.6 10/23/2020   CL 101 10/23/2020   CREATININE 0.94 10/23/2020   BUN 15 10/23/2020   CO2 31 10/23/2020   TSH 3.55 10/23/2020   HGBA1C 6.3 10/23/2020   MICROALBUR 0.7 06/29/2020    No results found.  Assessment & Plan:   Problem List Items Addressed This Visit      Unprioritized   Anxiety    managed by  Dr Nicolasa Ducking her psychiatrist, who is treating with zoloft and cymbalta. A total of 25 minutes was spent with patient more than half of which was spent in counseling patient and encouraging her to focus less on her disability  and the helplessness  she is nurturing and more on her blessings  In an effort to reduce her apparent resistance to feeling better        Candidiasis of skin    Of skin folds, aggravated by dampness after bathing.  Nystatin powder tor skin folds.       Relevant Medications   nystatin (MYCOSTATIN/NYSTOP) powder    Other Visit Diagnoses    Dysuria    -  Primary   Relevant Orders   Urinalysis, Routine w reflex microscopic (Completed)   Urine Culture (Completed)      I am having Deborah Velazquez start on nystatin. I am also having her maintain her DULoxetine, carbidopa-levodopa, sertraline, pregabalin, carbidopa-levodopa, aspirin EC, OVER THE COUNTER MEDICATION, estradiol, atorvastatin, hydrOXYzine, QUEtiapine, and rOPINIRole.  Meds ordered this encounter  Medications  . nystatin (MYCOSTATIN/NYSTOP) powder    Sig: Apply 1 application topically 2 (two) times daily. To rash until resolved.    Dispense:  15 g    Refill:  0    There are no discontinued medications.  Follow-up: No follow-ups on file.   [name], MD

## 2021-01-04 NOTE — Patient Instructions (Signed)
The rash in the skin creases is likely yeast  Use the nystatin  powder on the weekends instead of the lotrimin cream  After drying well

## 2021-01-05 LAB — URINE CULTURE
MICRO NUMBER:: 11519364
Result:: NO GROWTH
SPECIMEN QUALITY:: ADEQUATE

## 2021-01-06 DIAGNOSIS — B372 Candidiasis of skin and nail: Secondary | ICD-10-CM | POA: Insufficient documentation

## 2021-01-06 NOTE — Assessment & Plan Note (Addendum)
managed by  Dr Nicolasa Ducking her psychiatrist, who is treating with zoloft and cymbalta. A total of 25 minutes was spent with patient more than half of which was spent in counseling patient and encouraging her to focus less on her disability and the helplessness  she is nurturing and more on her blessings  In an effort to reduce her apparent resistance to feeling better

## 2021-01-06 NOTE — Assessment & Plan Note (Signed)
Of skin folds, aggravated by dampness after bathing.  Nystatin powder tor skin folds.

## 2021-01-06 NOTE — Assessment & Plan Note (Signed)
There is no evidence of UTI by culture results.  If she continues to have dysuria,  she should make appt for pelvic exam

## 2021-01-18 DIAGNOSIS — H353131 Nonexudative age-related macular degeneration, bilateral, early dry stage: Secondary | ICD-10-CM | POA: Diagnosis not present

## 2021-03-07 DIAGNOSIS — G4700 Insomnia, unspecified: Secondary | ICD-10-CM | POA: Diagnosis not present

## 2021-03-07 DIAGNOSIS — F411 Generalized anxiety disorder: Secondary | ICD-10-CM | POA: Diagnosis not present

## 2021-03-07 DIAGNOSIS — F332 Major depressive disorder, recurrent severe without psychotic features: Secondary | ICD-10-CM | POA: Diagnosis not present

## 2021-04-18 ENCOUNTER — Other Ambulatory Visit: Payer: Self-pay | Admitting: Internal Medicine

## 2021-05-10 DIAGNOSIS — G20 Parkinson's disease: Secondary | ICD-10-CM | POA: Diagnosis not present

## 2021-05-10 DIAGNOSIS — R4689 Other symptoms and signs involving appearance and behavior: Secondary | ICD-10-CM | POA: Diagnosis not present

## 2021-06-07 DIAGNOSIS — G4700 Insomnia, unspecified: Secondary | ICD-10-CM | POA: Diagnosis not present

## 2021-06-07 DIAGNOSIS — F332 Major depressive disorder, recurrent severe without psychotic features: Secondary | ICD-10-CM | POA: Diagnosis not present

## 2021-06-07 DIAGNOSIS — F411 Generalized anxiety disorder: Secondary | ICD-10-CM | POA: Diagnosis not present

## 2021-06-28 ENCOUNTER — Ambulatory Visit: Payer: PPO

## 2021-06-28 ENCOUNTER — Telehealth: Payer: Self-pay

## 2021-06-28 NOTE — Telephone Encounter (Signed)
Spoke with patient and prefers to cancel at this time due to daughter unavailable. Reschedule as appropriate.

## 2021-07-17 ENCOUNTER — Other Ambulatory Visit: Payer: Self-pay | Admitting: Internal Medicine

## 2021-09-04 ENCOUNTER — Telehealth: Payer: Self-pay | Admitting: Internal Medicine

## 2021-09-04 ENCOUNTER — Ambulatory Visit (INDEPENDENT_AMBULATORY_CARE_PROVIDER_SITE_OTHER): Payer: PPO | Admitting: Physician Assistant

## 2021-09-04 DIAGNOSIS — U071 COVID-19: Secondary | ICD-10-CM

## 2021-09-04 MED ORDER — MOLNUPIRAVIR EUA 200MG CAPSULE: 4.0000 | ORAL_CAPSULE | Freq: Two times a day (BID) | ORAL | 0 refills | Status: AC

## 2021-09-04 NOTE — Patient Instructions (Signed)
Please monitor patient and symptoms closely.  Should oxygen saturation drop below 90% and stay consistently in the 80s, it is time to go to the emergency department.  Very low threshold for admission to the emergency department should symptoms acutely worsen or change at all.  Please call PCP with update in the morning.  Start on [place] as directed.

## 2021-09-04 NOTE — Telephone Encounter (Signed)
Patients caregiver called in stating that she woke up this morning with a sore throat,headache and temperature of 99.7.The patient was also exposed to [place] last week.She will go to the patients location and call our office back to be connected to Access Nurse to be triaged.

## 2021-09-04 NOTE — Telephone Encounter (Signed)
Calling back in with patient present. Patient was tested with a home test and it was positive for Covid 19.   Unable to get access nurse on the phone. Scheduled for a virtual visit.

## 2021-09-04 NOTE — Progress Notes (Signed)
Virtual Visit via Telephone Note  I connected with Deborah Velazquez on 09/04/21 at 11:45 AM EDT by telephone and verified that I am speaking with the correct person using two identifiers.  Location: Patient: home Provider: Therapist, music at [employer]   I discussed the limitations, risks, security and privacy concerns of performing an evaluation and management service by telephone and the availability of in person appointments. I also discussed with the patient that there may be a patient responsible charge related to this service. The patient expressed understanding and agreed to proceed.   History of Present Illness:  Family member, [name], provided a majority of the history.  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 COVID-19 Diagnosis confirmed via home antigen test.  We discussed current algorithm recommendations for prescribing outpatient antivirals.   Risks versus benefits discussed.  Agreed to start on Molnupiravir at this time and possible side effects discussed.  Advised self-isolation at home for the next 5 days and then masking around others for at least an additional 5 days.  Treat supportively at this time including sleeping prone, deep breathing exercises, pushing fluids, walking every few hours, vitamins C and D, and Tylenol or ibuprofen as needed.  The patient understands that COVID-19 illness can wax and wane.  Should the symptoms acutely worsen or patient starts to experience sudden shortness of breath, chest pain, severe weakness, the  patient will go straight to the emergency department.  Also advised home pulse oximetry monitoring and for any reading consistently under 90%, should also report to the emergency department.  The patient and caregiver will continue to keep [place] updated.   Follow Up Instructions:    I discussed the assessment and treatment plan with the patient. The patient was provided an opportunity to ask questions and all were answered. The patient agreed with the plan and demonstrated an understanding of the instructions.   The patient was advised to call back or seek an in-person evaluation if the symptoms worsen or if the condition fails to improve as anticipated.  I provided 13 minutes & 57 seconds of non-face-to-face time during this encounter.   Langley Ingalls M Adrean Findlay, PA-C

## 2021-09-10 DIAGNOSIS — F411 Generalized anxiety disorder: Secondary | ICD-10-CM | POA: Diagnosis not present

## 2021-09-10 DIAGNOSIS — G4700 Insomnia, unspecified: Secondary | ICD-10-CM | POA: Diagnosis not present

## 2021-09-10 DIAGNOSIS — F332 Major depressive disorder, recurrent severe without psychotic features: Secondary | ICD-10-CM | POA: Diagnosis not present

## 2021-09-26 DIAGNOSIS — R4689 Other symptoms and signs involving appearance and behavior: Secondary | ICD-10-CM | POA: Diagnosis not present

## 2021-09-26 DIAGNOSIS — G5793 Unspecified mononeuropathy of bilateral lower limbs: Secondary | ICD-10-CM | POA: Diagnosis not present

## 2021-09-26 DIAGNOSIS — F0280 Dementia in other diseases classified elsewhere without behavioral disturbance: Secondary | ICD-10-CM | POA: Diagnosis not present

## 2021-09-26 DIAGNOSIS — G20 Parkinson's disease: Secondary | ICD-10-CM | POA: Diagnosis not present

## 2021-09-26 DIAGNOSIS — F5104 Psychophysiologic insomnia: Secondary | ICD-10-CM | POA: Diagnosis not present

## 2021-09-26 DIAGNOSIS — G4752 REM sleep behavior disorder: Secondary | ICD-10-CM | POA: Diagnosis not present

## 2021-09-27 ENCOUNTER — Other Ambulatory Visit: Payer: Self-pay | Admitting: Student

## 2021-09-27 DIAGNOSIS — G20 Parkinson's disease: Secondary | ICD-10-CM

## 2021-10-01 ENCOUNTER — Ambulatory Visit
Admission: RE | Admit: 2021-10-01 | Discharge: 2021-10-01 | Disposition: A | Discharge status: Home / Self Care | Payer: PPO | Source: Ambulatory Visit | Attending: Student | Admitting: Student

## 2021-10-01 ENCOUNTER — Other Ambulatory Visit: Payer: Self-pay

## 2021-10-01 DIAGNOSIS — I6782 Cerebral ischemia: Secondary | ICD-10-CM | POA: Diagnosis not present

## 2021-10-01 DIAGNOSIS — G20 Parkinson's disease: Secondary | ICD-10-CM | POA: Diagnosis not present

## 2021-10-01 DIAGNOSIS — G3184 Mild cognitive impairment, so stated: Secondary | ICD-10-CM | POA: Diagnosis not present

## 2021-11-05 DIAGNOSIS — F411 Generalized anxiety disorder: Secondary | ICD-10-CM | POA: Diagnosis not present

## 2021-11-05 DIAGNOSIS — G4700 Insomnia, unspecified: Secondary | ICD-10-CM | POA: Diagnosis not present

## 2021-11-05 DIAGNOSIS — F332 Major depressive disorder, recurrent severe without psychotic features: Secondary | ICD-10-CM | POA: Diagnosis not present

## 2021-11-27 IMAGING — MR MR HEAD W/O CM
13 series · 48 of 48 positions shown · non-contrast
Comparison: MRI of the brain April 30, 2019.

CLINICAL DATA: Cognitive impairment.  Primary parkinsonism.

EXAM:
MRI HEAD WITHOUT CONTRAST
TECHNIQUE: Multiplanar, multiecho pulse sequences of the brain and surrounding
structures were obtained without intravenous contrast.

[Series 5: ax dwi_tracew · axial · 3.0mm · 1.80mm/px · z∈[-81,+70]mm · 2 of 48 slices shown]
[im 1/48]
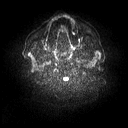
[im 48/48]
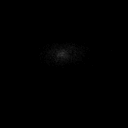

[Series 6: ax dwi_adc · axial · 3.0mm · 1.80mm/px · z∈[-81,+70]mm · 3 of 48 slices shown]
[im 1/48]
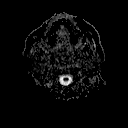
[im 24/48]
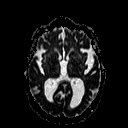
[im 48/48]
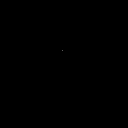

[Series 7: cor dwi_tracew · coronal · 5.0mm · 1.80mm/px · 3 of 38 slices shown]
[im 1/38]
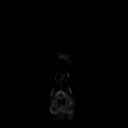
[im 19/38]
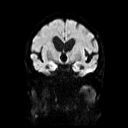
[im 38/38]
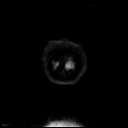

[Series 8: cor dwi_adc · coronal · 5.0mm · 1.80mm/px · 3 of 38 slices shown]
[im 1/38]
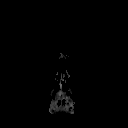
[im 19/38]
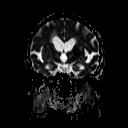
[im 38/38]
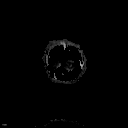

[Series 9: FLAIR · axial · 3.0mm · 0.69mm/px · z∈[-83,+75]mm · 4 of 55 slices shown]
[im 1/55]
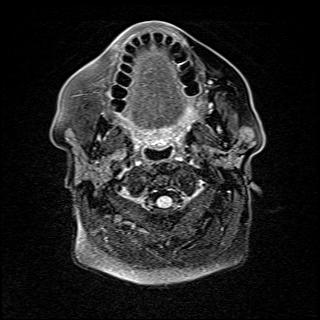
[im 19/55]
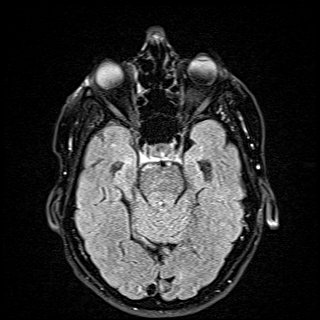
[im 37/55]
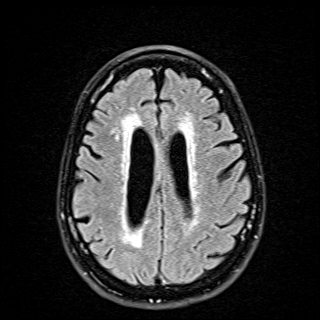
[im 55/55]
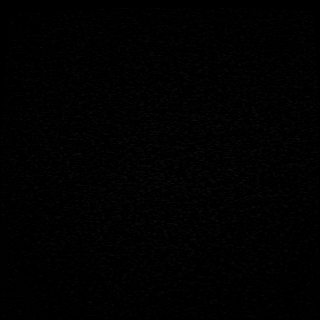

[Series 10: mag_images · axial · 3.0mm · 0.90mm/px · z∈[-79,+71]mm · 4 of 52 slices shown]
[im 1/52]
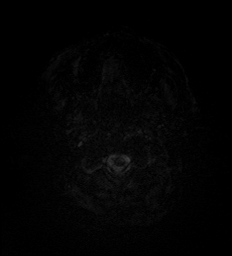
[im 18/52]
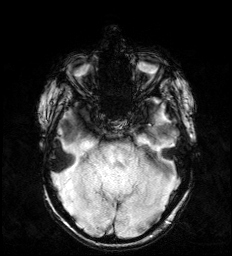
[im 35/52]
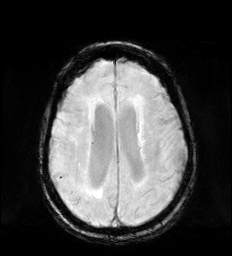
[im 52/52]
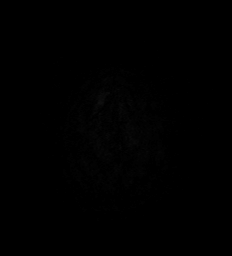

[Series 11: pha_images · axial · 3.0mm · 0.90mm/px · z∈[-79,+71]mm · 4 of 52 slices shown]
[im 1/52]
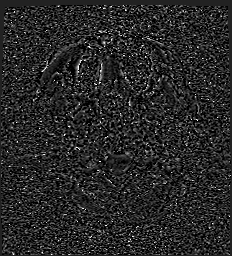
[im 18/52]
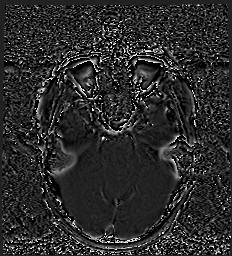
[im 35/52]
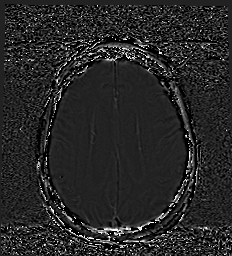
[im 52/52]
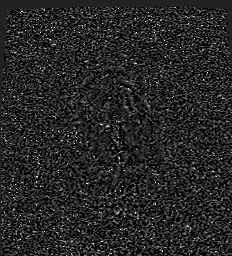

[Series 12: swi_images · axial · 3.0mm · 0.90mm/px · z∈[-79,+71]mm · 4 of 52 slices shown]
[im 1/52]
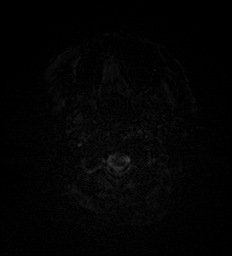
[im 18/52]
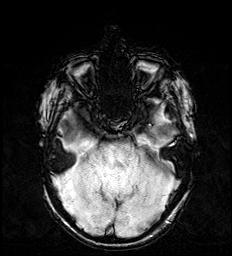
[im 35/52]
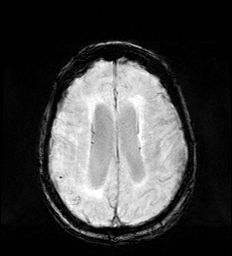
[im 52/52]
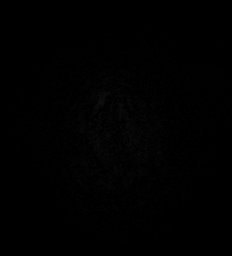

[Series 13: mip_images(sw) · axial · 24.0mm · 0.90mm/px · z∈[-69,+60]mm · 3 of 45 slices shown]
[im 1/45]
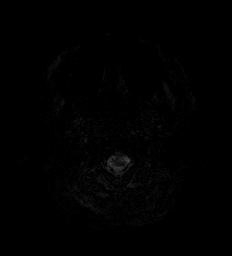
[im 23/45]
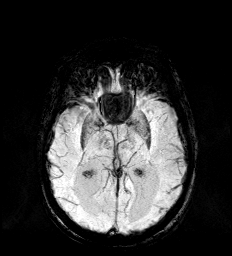
[im 45/45]
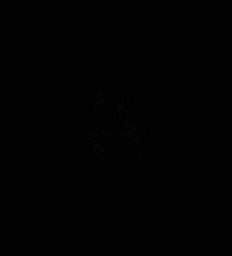

[Series 14: T1 · sagittal · 5.0mm · 0.94mm/px · 2 of 23 slices shown (1 of 2)]
[im 1/23]
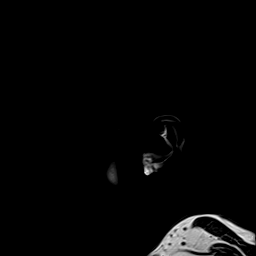
[im 23/23]
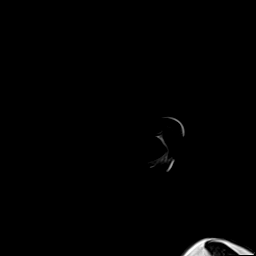

[Series 15: T2 · axial · 5.0mm · 0.53mm/px · z∈[-74,+66]mm · 2 of 25 slices shown (1 of 2)]
[im 1/25]
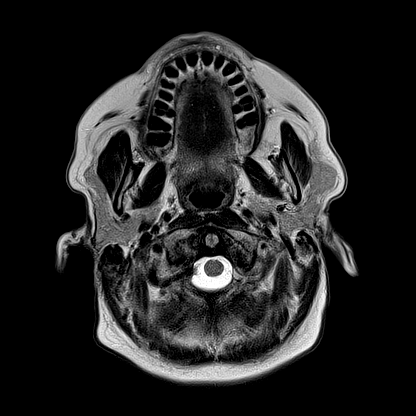
[im 25/25]
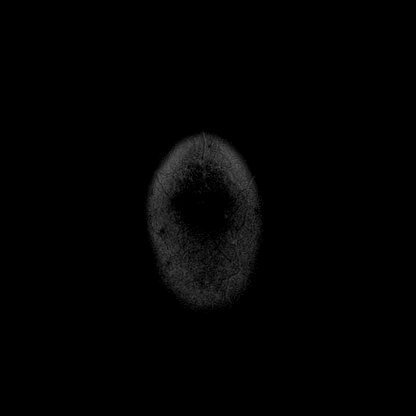

[Series 16: T1 · axial · 1.0mm · 0.98mm/px · z∈[-93,+79]mm · 12 of 175 slices shown (2 of 2)]
[im 1/175]
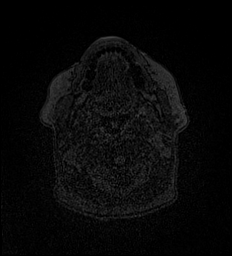
[im 16/175]
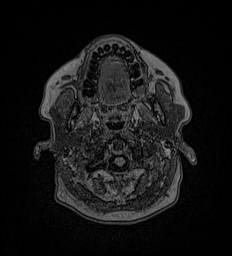
[im 32/175]
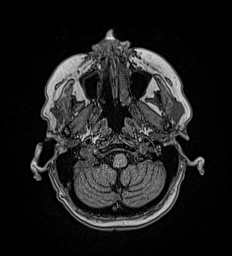
[im 48/175]
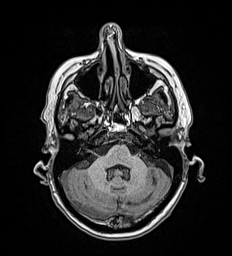
[im 64/175]
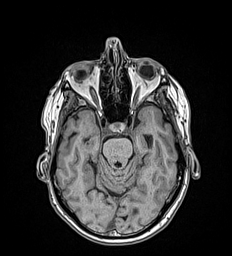
[im 80/175]
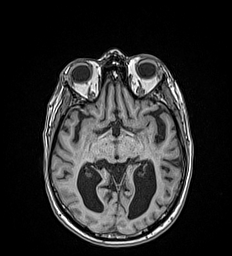
[im 95/175]
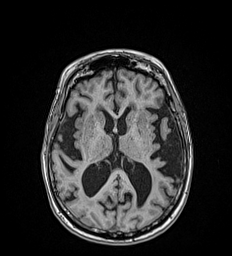
[im 111/175]
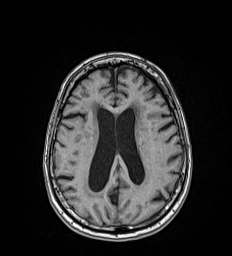
[im 127/175]
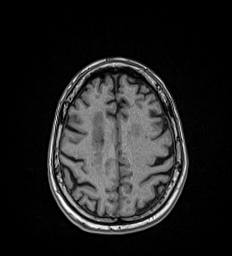
[im 143/175]
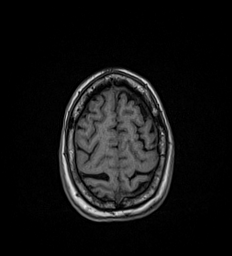
[im 159/175]
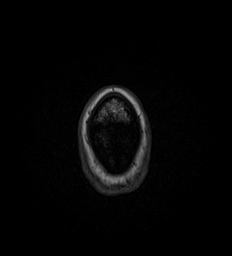
[im 175/175]
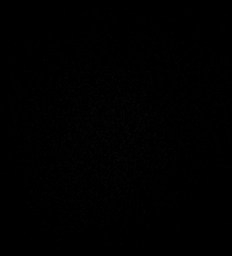

[Series 17: T2 · coronal · 5.0mm · 0.45mm/px · 2 of 31 slices shown (2 of 2)]
[im 1/31]
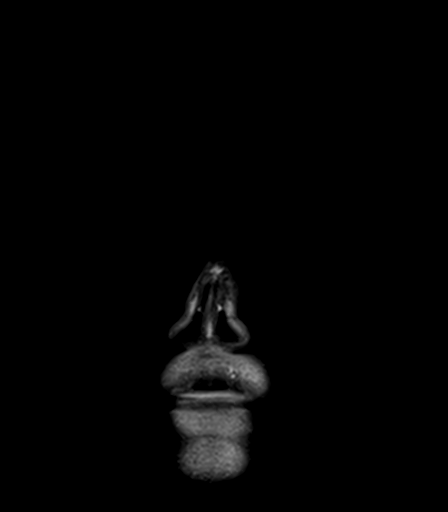
[im 31/31]
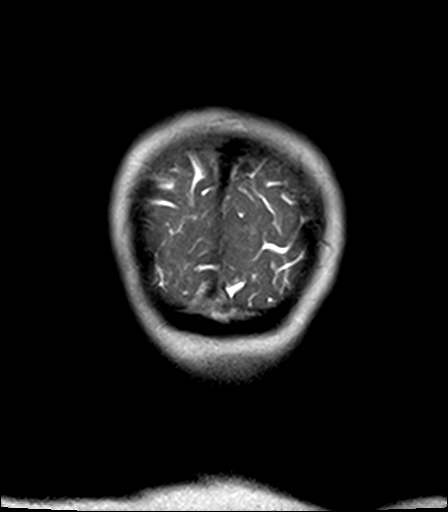

[48 of 48 positions shown; findings below may reference images not displayed]

FINDINGS: Brain: No acute infarction, hemorrhage, extra-axial collection or
mass lesion.

Scattered and confluent foci of T2 hyperintensity are seen within
the white matter of the cerebral hemispheres and within the pons,
nonspecific, likely related to chronic small vessel ischemia, not
significantly changed from prior MRI. Prominence of the
supratentorial ventricles and sylvian fissures with sparing of the
high convexity sulci, may be seen in the setting of normal pressure
hydrocephalus in the appropriate clinical scenario.

Vascular: Normal flow voids.

Skull and upper cervical spine: Normal marrow signal.

Sinuses/Orbits: Bilateral lens surgery. Paranasal sinuses are
essentially clear.

Other: None.
IMPRESSION: 1. Moderate chronic microvascular ischemic changes the white matter,
stable.
2. Prominence of the supratentorial ventricles and sylvian fissures
with sparing of the high convexity sulci, may be seen in the setting
of normal pressure hydrocephalus in the appropriate clinical
scenario. This has not significantly changed since prior MRI.

## 2021-12-20 ENCOUNTER — Other Ambulatory Visit: Payer: Self-pay | Admitting: Internal Medicine

## 2021-12-20 DIAGNOSIS — B372 Candidiasis of skin and nail: Secondary | ICD-10-CM

## 2021-12-20 DIAGNOSIS — Z0000 Encounter for general adult medical examination without abnormal findings: Secondary | ICD-10-CM

## 2021-12-27 DIAGNOSIS — R4689 Other symptoms and signs involving appearance and behavior: Secondary | ICD-10-CM | POA: Diagnosis not present

## 2021-12-27 DIAGNOSIS — F0280 Dementia in other diseases classified elsewhere without behavioral disturbance: Secondary | ICD-10-CM | POA: Diagnosis not present

## 2021-12-27 DIAGNOSIS — G20 Parkinson's disease: Secondary | ICD-10-CM | POA: Diagnosis not present

## 2021-12-27 DIAGNOSIS — G5793 Unspecified mononeuropathy of bilateral lower limbs: Secondary | ICD-10-CM | POA: Diagnosis not present

## 2021-12-27 DIAGNOSIS — F419 Anxiety disorder, unspecified: Secondary | ICD-10-CM | POA: Diagnosis not present

## 2022-01-10 ENCOUNTER — Telehealth: Payer: Self-pay | Admitting: Internal Medicine

## 2022-01-10 ENCOUNTER — Other Ambulatory Visit (INDEPENDENT_AMBULATORY_CARE_PROVIDER_SITE_OTHER): Payer: PPO

## 2022-01-10 ENCOUNTER — Other Ambulatory Visit: Payer: Self-pay

## 2022-01-10 DIAGNOSIS — Z0000 Encounter for general adult medical examination without abnormal findings: Secondary | ICD-10-CM | POA: Diagnosis not present

## 2022-01-10 LAB — COMPREHENSIVE METABOLIC PANEL
ALT: 4 U/L (ref 0–35)
AST: 15 U/L (ref 0–37)
Albumin: 4.6 g/dL (ref 3.5–5.2)
Alkaline Phosphatase: 63 U/L (ref 39–117)
BUN: 12 mg/dL (ref 6–23)
CO2: 34 mEq/L — ABNORMAL HIGH (ref 19–32)
Calcium: 9.6 mg/dL (ref 8.4–10.5)
Chloride: 102 mEq/L (ref 96–112)
Creatinine, Ser: 0.82 mg/dL (ref 0.40–1.20)
GFR: 68.95 mL/min (ref >60.00)
Glucose, Bld: 135 mg/dL — ABNORMAL HIGH (ref 70–99)
Potassium: 4.3 mEq/L (ref 3.5–5.1)
Sodium: 140 mEq/L (ref 135–145)
Total Bilirubin: 0.4 mg/dL (ref 0.2–1.2)
Total Protein: 7.1 g/dL (ref 6.0–8.3)

## 2022-01-10 LAB — CBC WITH DIFFERENTIAL/PLATELET
Basophils Absolute: 0 10*3/uL (ref 0.0–0.1)
Basophils Relative: 0.7 % (ref 0.0–3.0)
Eosinophils Absolute: 0.1 10*3/uL (ref 0.0–0.7)
Eosinophils Relative: 2.1 % (ref 0.0–5.0)
HCT: 40.7 % (ref 36.0–46.0)
Hemoglobin: 13.3 g/dL (ref 12.0–15.0)
Lymphocytes Relative: 26.5 % (ref 12.0–46.0)
Lymphs Abs: 1.6 10*3/uL (ref 0.7–4.0)
MCHC: 32.8 g/dL (ref 30.0–36.0)
MCV: 90.4 fl (ref 78.0–100.0)
Monocytes Absolute: 0.3 10*3/uL (ref 0.1–1.0)
Monocytes Relative: 5.7 % (ref 3.0–12.0)
Neutro Abs: 4 10*3/uL (ref 1.4–7.7)
Neutrophils Relative %: 65 % (ref 43.0–77.0)
Platelets: 260 10*3/uL (ref 150.0–400.0)
RBC: 4.5 Mil/uL (ref 3.87–5.11)
RDW: 14.8 % (ref 11.5–15.5)
WBC: 6.1 10*3/uL (ref 4.0–10.5)

## 2022-01-10 LAB — LIPID PANEL
Cholesterol: 229 mg/dL — ABNORMAL HIGH (ref 0–200)
HDL: 71.3 mg/dL (ref >39.00)
NonHDL: 158.1
Total CHOL/HDL Ratio: 3
Triglycerides: 238 mg/dL — ABNORMAL HIGH (ref 0.0–149.0)
VLDL: 47.6 mg/dL — ABNORMAL HIGH (ref 0.0–40.0)

## 2022-01-10 LAB — HEMOGLOBIN A1C: Hgb A1c MFr Bld: 6.2 % (ref 4.6–6.5)

## 2022-01-10 LAB — LDL CHOLESTEROL, DIRECT: Direct LDL: 109 mg/dL

## 2022-01-10 NOTE — Telephone Encounter (Signed)
Patient in for labs this morning very lethargic had taken her morning dose of carbidopa-levodopa per patient caregiver makes her very lethargic in the mornings. 4 people to assist patient safely from [place] into wheelchair into the building for labs. Patient caregiver advised that patient is alert in the afternoons and can usually walk on her own. I advised caregiver we could arrange labs in the future in the afternoon or look into the cost of having labs drawn at home. Patient caregiver ask in the future if all labs could be scheduled after 1 PM.  I advised I would let PCP and CMA know that going forward if okay with PCP we would schedule late day labs.

## 2022-01-15 ENCOUNTER — Ambulatory Visit (INDEPENDENT_AMBULATORY_CARE_PROVIDER_SITE_OTHER): Payer: PPO | Admitting: Internal Medicine

## 2022-01-15 ENCOUNTER — Other Ambulatory Visit: Payer: Self-pay

## 2022-01-15 ENCOUNTER — Encounter: Payer: Self-pay | Admitting: Internal Medicine

## 2022-01-15 DIAGNOSIS — G20 Parkinson's disease: Secondary | ICD-10-CM | POA: Diagnosis not present

## 2022-01-15 DIAGNOSIS — R5383 Other fatigue: Secondary | ICD-10-CM | POA: Diagnosis not present

## 2022-01-15 DIAGNOSIS — R7303 Prediabetes: Secondary | ICD-10-CM

## 2022-01-15 DIAGNOSIS — E7800 Pure hypercholesterolemia, unspecified: Secondary | ICD-10-CM

## 2022-01-15 NOTE — Assessment & Plan Note (Signed)
Managed by Dr. Manuella Ghazi with Sinemet taken 4 times daily. She has a recumbent bike which she refuses to use,  And 24 hour care at home. End of life objectives were discussed today: she is not giving up,  But understands that she has a poor prognosis because of her concurrent psychiatric issues

## 2022-01-15 NOTE — Patient Instructions (Addendum)
We did not make any changes today.   recommend that you Consider reducing the Lyrica dose if the lethargy becomes more routine/daily  The labs are all NORMAL  Eddy you need to make a choice:  LIVE IN SPITE OF YOUR DISEASE,  AND MAKE THE MOST OF EVERY DAY  ONLY YOU CAN DO THIS.

## 2022-01-15 NOTE — Assessment & Plan Note (Signed)
Intermittent episodes per her aides.  Her profound lethargy today is attributed to fatigue following a day of increased activity yesterday.  Advised aides to reduce lyrica dose if episodes become more persistent

## 2022-01-15 NOTE — Assessment & Plan Note (Signed)
LDL is at goal.  encouraged to o follow a  Low glycemic index diet, maintain a BMI < 27 and participate in  regular exercise.  WE should repeat the labs in  6 months.     Lab Results  Component Value Date   CHOL 229 (H) 01/10/2022   HDL 71.30 01/10/2022   LDLCALC 81 07/09/2018   LDLDIRECT 109.0 01/10/2022   TRIG 238.0 (H) 01/10/2022   CHOLHDL 3 01/10/2022

## 2022-01-15 NOTE — Assessment & Plan Note (Signed)
Stable by repeat A1c.  Continue low glycemic index diet   Lab Results  Component Value Date   HGBA1C 6.2 01/10/2022

## 2022-01-15 NOTE — Progress Notes (Signed)
Subjective:  Patient ID: Deborah Velazquez, female    DOB: 1946/09/11  Age: 76 y.o. MRN: [id number]  CC: Diagnoses of Lethargy, Parkinson's disease ([place]), Prediabetes, and Pure hypercholesterolemia were pertinent to this visit.   This visit occurred during the SARS-CoV-2 public health emergency.  Safety protocols were in place, including screening questions prior to the visit, additional usage of staff PPE, and extensive cleaning of exam room while observing appropriate contact time as indicated for disinfecting solutions.    HPI Deborah Velazquez presents for  Chief Complaint  Patient presents with   Follow-up    Yearly follow up   Accompanied  by aides Sharyn Lull   Lewy body dementia,  and PD.  Seen by Manuella Ghazi last week.  MRi reported,  with progressive brain atrophy. Steady decline for the last 6 months.  Non motor seizures.  Now controlled with lamotrigine,  hallucinations less frequent.  Ropinirole stopped due to narcolepsy.   She is very sedated today,  and caregiver states that she slept through last Thursdays'  blood draw but woke up as soon as the car turned on to her road, "like a light switch" . Was able to walk from the car to the house.  BP is low as well but she has no history of hypertension.  Taking lyrica now at 150 mg bid.  and cymbalta for management of neuropathy,  As well as Sinamet 5 times daily for PD, risperdal recently stopped and seroquel resumed qhs   Today's lethargy is unusual and "quite rare" per her aides, occurs occasionally but not every day.  Aides note that she was very anxious  yesterday due to the anticipation of the meeting with a lawyer yesterday,  did not sleep well last night. As soon as the lawyer left,  she became lethargic the rest of yesterday and has not been alert since  then depsite sleeping 7 hours last night   at times she eats very well and can maintain adequate hydration.    End of Life discussion attempted today but patient too lethargic to  respond  Husband has metastatic prostate ca.     Outpatient Medications Prior to Visit  Medication Sig Dispense Refill   aspirin EC 81 MG tablet Take 81 mg by mouth daily. Swallow whole.     atorvastatin (LIPITOR) 10 MG tablet TAKE 1 TABLET(10 MG) BY MOUTH AT BEDTIME 90 tablet 3   carbidopa-levodopa (SINEMET CR) 50-200 MG tablet Take 1 tablet by mouth at bedtime. Take one tablet at 2p & one at 5:30p     carbidopa-levodopa (SINEMET IR) 25-100 MG tablet 2 in the morning, 1 in the afternoon, 1 in the evening (Patient taking differently: Take 1.5 tablets by mouth 4 (four) times daily.) 360 tablet 1   DULoxetine (CYMBALTA) 60 MG capsule Take 60 mg by mouth daily.      estradiol (ESTRACE) 0.1 MG/GM vaginal cream INSERT 1 APPLICATORFUL VAGINALLY 2 TIMES A WEEK AS DIRECTED 42.5 g 0   hydrOXYzine (ATARAX/VISTARIL) 25 MG tablet Take 25 mg by mouth 2 (two) times daily as needed.     lamoTRIgine (LAMICTAL) 100 MG tablet Take 100 mg by mouth 2 (two) times daily.     nystatin (MYCOSTATIN/NYSTOP) powder Apply 1 application topically 2 (two) times daily. To rash until resolved. 15 g 0   OVER THE COUNTER MEDICATION Take 1 capsule by mouth in the morning and at bedtime. Doterra Life Long Vitality Pack     pregabalin (LYRICA) 150 MG capsule  Take 150 mg by mouth 2 (two) times daily.      sertraline (ZOLOFT) 100 MG tablet Take 200 mg by mouth daily.      QUEtiapine (SEROQUEL) 25 MG tablet Take 12.5 mg by mouth at bedtime. (Patient not taking: Reported on 01/15/2022)     rOPINIRole (REQUIP XL) 2 MG 24 hr tablet Take by mouth. (Patient not taking: Reported on 01/15/2022)     No facility-administered medications prior to visit.    Review of Systems;  Patient denies headache, fevers, malaise, unintentional weight loss, skin rash, eye pain, sinus congestion and sinus pain, sore throat, dysphagia,  hemoptysis , cough, dyspnea, wheezing, chest pain, palpitations, orthopnea, edema, abdominal pain, nausea, melena,  diarrhea, constipation, flank pain, dysuria, hematuria, urinary  Frequency, nocturia, numbness, tingling, seizures,  Focal weakness, Loss of consciousness,  Tremor, insomnia, depression, anxiety, and suicidal ideation.      Objective:  BP (!) 86/54 (BP Location: Left Arm, Patient Position: Sitting, Cuff Size: Normal)    Pulse 80    Ht 5\' 5"  (1.651 m)    Wt 164 lb 12.8 oz (74.8 kg)    SpO2 94%    BMI 27.42 kg/m   BP Readings from Last 3 Encounters:  01/15/22 (!) 86/54  01/04/21 (!) 84/58  10/25/20 110/68    Wt Readings from Last 3 Encounters:  01/15/22 164 lb 12.8 oz (74.8 kg)  01/04/21 172 lb 3.2 oz (78.1 kg)  10/25/20 169 lb (76.7 kg)    General appearance: alert, cooperative and appears stated age Ears: normal TM's and external ear canals both ears Throat: lips, mucosa, and tongue normal; teeth and gums normal Neck: no adenopathy, no carotid bruit, supple, symmetrical, trachea midline and thyroid not enlarged, symmetric, no tenderness/mass/nodules Back: symmetric, no curvature. ROM normal. No CVA tenderness. Lungs: clear to auscultation bilaterally Heart: regular rate and rhythm, S1, S2 normal, no murmur, click, rub or gallop Abdomen: soft, non-tender; bowel sounds normal; no masses,  no organomegaly Pulses: 2+ and symmetric Skin: Skin color, texture, turgor normal. No rashes or lesions Lymph nodes: Cervical, supraclavicular, and axillary nodes normal.  Lab Results  Component Value Date   HGBA1C 6.2 01/10/2022   HGBA1C 6.3 10/23/2020   HGBA1C 6.1 06/29/2020    Lab Results  Component Value Date   CREATININE 0.82 01/10/2022   CREATININE 0.94 10/23/2020   CREATININE 0.77 06/29/2020    Lab Results  Component Value Date   WBC 6.1 01/10/2022   HGB 13.3 01/10/2022   HCT 40.7 01/10/2022   PLT 260.0 01/10/2022   GLUCOSE 135 (H) 01/10/2022   CHOL 229 (H) 01/10/2022   TRIG 238.0 (H) 01/10/2022   HDL 71.30 01/10/2022   LDLDIRECT 109.0 01/10/2022   LDLCALC 81 07/09/2018    ALT 4 01/10/2022   AST 15 01/10/2022   NA 140 01/10/2022   K 4.3 01/10/2022   CL 102 01/10/2022   CREATININE 0.82 01/10/2022   BUN 12 01/10/2022   CO2 34 (H) 01/10/2022   TSH 3.55 10/23/2020   HGBA1C 6.2 01/10/2022   MICROALBUR 0.7 06/29/2020    MR BRAIN WO CONTRAST  Result Date: 10/02/2021 CLINICAL DATA:  Cognitive impairment.  Primary parkinsonism. EXAM: MRI HEAD WITHOUT CONTRAST TECHNIQUE: Multiplanar, multiecho pulse sequences of the brain and surrounding structures were obtained without intravenous contrast. COMPARISON:  MRI of the brain April 30, 2019. FINDINGS: Brain: No acute infarction, hemorrhage, extra-axial collection or mass lesion. Scattered and confluent foci of T2 hyperintensity are seen within the white matter of  the cerebral hemispheres and within the pons, nonspecific, likely related to chronic small vessel ischemia, not significantly changed from prior MRI. Prominence of the supratentorial ventricles and sylvian fissures with sparing of the high convexity sulci, may be seen in the setting of normal pressure hydrocephalus in the appropriate clinical scenario. Vascular: Normal flow voids. Skull and upper cervical spine: Normal marrow signal. Sinuses/Orbits: Bilateral lens surgery. Paranasal sinuses are essentially clear. Other: None. IMPRESSION: 1. Moderate chronic microvascular ischemic changes the white matter, stable. 2. Prominence of the supratentorial ventricles and sylvian fissures with sparing of the high convexity sulci, may be seen in the setting of normal pressure hydrocephalus in the appropriate clinical scenario. This has not significantly changed since prior MRI. Electronically Signed   By: Pedro Earls M.D.   On: 10/02/2021 17:31    Assessment & Plan:   Problem List Items Addressed This Visit     Lethargy    Intermittent episodes per her aides.  Her profound lethargy today is attributed to fatigue following a day of increased activity  yesterday.  Advised aides to reduce lyrica dose if episodes become more persistent       Parkinson's disease ([place])    Managed by Dr. Manuella Ghazi with Sinemet taken 4 times daily. She has a recumbent bike which she refuses to use,  And 24 hour care at home. End of life objectives were discussed today: she is not giving up,  But understands that she has a poor prognosis because of her concurrent psychiatric issues       Relevant Medications   lamoTRIgine (LAMICTAL) 100 MG tablet   Prediabetes    Stable by repeat A1c.  Continue low glycemic index diet   Lab Results  Component Value Date   HGBA1C 6.2 01/10/2022         Pure hypercholesterolemia    LDL is at goal.  encouraged to o follow a  Low glycemic index diet, maintain a BMI < 27 and participate in  regular exercise.  WE should repeat the labs in  6 months.     Lab Results  Component Value Date   CHOL 229 (H) 01/10/2022   HDL 71.30 01/10/2022   LDLCALC 81 07/09/2018   LDLDIRECT 109.0 01/10/2022   TRIG 238.0 (H) 01/10/2022   CHOLHDL 3 01/10/2022          I spent 40 minutes dedicated to the care of this patient on the date of this encounter to include pre-visit review of patient's medical history,  most recent imaging studies, Face-to-face time with the patient , and post visit ordering of testing and therapeutics.    Follow-up: Return in about 6 months (around 07/15/2022).   [name], MD

## 2022-02-04 DIAGNOSIS — F332 Major depressive disorder, recurrent severe without psychotic features: Secondary | ICD-10-CM | POA: Diagnosis not present

## 2022-02-04 DIAGNOSIS — G4700 Insomnia, unspecified: Secondary | ICD-10-CM | POA: Diagnosis not present

## 2022-02-04 DIAGNOSIS — F411 Generalized anxiety disorder: Secondary | ICD-10-CM | POA: Diagnosis not present

## 2022-02-26 ENCOUNTER — Ambulatory Visit (INDEPENDENT_AMBULATORY_CARE_PROVIDER_SITE_OTHER): Payer: PPO | Admitting: Internal Medicine

## 2022-02-26 ENCOUNTER — Encounter: Payer: Self-pay | Admitting: Internal Medicine

## 2022-02-26 VITALS — BP 92/58 | HR 90 | Temp 98.2°F | Ht 65.0 in | Wt 164.8 lb

## 2022-02-26 DIAGNOSIS — R29898 Other symptoms and signs involving the musculoskeletal system: Secondary | ICD-10-CM | POA: Diagnosis not present

## 2022-02-26 DIAGNOSIS — R4189 Other symptoms and signs involving cognitive functions and awareness: Secondary | ICD-10-CM | POA: Diagnosis not present

## 2022-02-26 DIAGNOSIS — G20 Parkinson's disease: Secondary | ICD-10-CM | POA: Diagnosis not present

## 2022-02-26 DIAGNOSIS — R051 Acute cough: Secondary | ICD-10-CM | POA: Diagnosis not present

## 2022-02-26 DIAGNOSIS — J0100 Acute maxillary sinusitis, unspecified: Secondary | ICD-10-CM | POA: Diagnosis not present

## 2022-02-26 LAB — POC COVID19 BINAXNOW: SARS Coronavirus 2 Ag: NEGATIVE

## 2022-02-26 MED ORDER — NYSTATIN 100000 UNIT/GM EX POWD: 1.0000 "application " | Freq: Two times a day (BID) | CUTANEOUS | 0 refills | Status: DC

## 2022-02-26 MED ORDER — LORATADINE 10 MG PO TABS
10.0000 mg | ORAL_TABLET | Freq: Two times a day (BID) | ORAL | 2 refills | Status: DC
Start: 2022-02-26 — End: 2022-07-16

## 2022-02-26 MED ORDER — AMOXICILLIN-POT CLAVULANATE 875-125 MG PO TABS: 1.0000 | ORAL_TABLET | Freq: Two times a day (BID) | ORAL | 0 refills | Status: DC

## 2022-02-26 MED ORDER — PREDNISONE 10 MG PO TABS: ORAL_TABLET | ORAL | 0 refills | Status: DC

## 2022-02-26 MED ORDER — BENZONATATE 200 MG PO CAPS: 200.0000 mg | ORAL_CAPSULE | Freq: Three times a day (TID) | ORAL | 1 refills | Status: DC | PRN

## 2022-02-26 NOTE — Assessment & Plan Note (Addendum)
Rivastigmine was  advised by neurologist,patient was asked  to discuss with Dr Nicolasa Ducking ,  No medication was added per patient preference ?

## 2022-02-26 NOTE — Patient Instructions (Addendum)
I  am treating Deborah Velazquez  bacterial sinusitis which is probably a  complication from her  allergies causing  persistent sinus congestion. ? ? I am prescribing an antibiotic (augmentin )  and prednisone  To manage the infection and the inflammation in your ear/sinuses.  ? ?I have added Benzonatate capsules for the cough  ? ?I also advise use of the following OTC meds to help with your other symptoms.: ? ?Claritin 10 mg twice daily for allergies  ? ? ? ?Please continue the daily  probiotic  to prevent a very serious antibiotic associated infection  Called clostridium dificile colitis that can cause diarrhea, multi organ failure, sepsis and death if not managed.   ? ? ?I am letting you know that I am referring her to  our chronic care management team which includes a nurse case manager,   and a Education officer, museum. These women  can help  provide additional services to my patients who are on Medicare and dealing with  chronic diseases .   I do not expect this referral to cost you anything out of pocket, but I do think they  will be able to help you maximize your BENEFITS.  They  will make contact with  You by phone in the next week  ?

## 2022-02-26 NOTE — Assessment & Plan Note (Signed)
PER CAREGIVER,  DR [facility] HAS ORDERED HOME PHYSICAL THERAPY  ?

## 2022-02-26 NOTE — Progress Notes (Signed)
? ?Subjective:  ?Patient ID: Deborah Velazquez, female    DOB: 1946/10/23  Age: 76 y.o. MRN: [id number] ? ?CC: The primary encounter diagnosis was Acute cough. Diagnoses of Parkinson's disease ([place]), Cognitive impairment, Acute non-recurrent maxillary sinusitis, and Proximal leg weakness were also pertinent to this visit. ? ? ?This visit occurred during the SARS-CoV-2 public health emergency.  Safety protocols were in place, including screening questions prior to the visit, additional usage of staff PPE, and extensive cleaning of exam room while observing appropriate contact time as indicated for disinfecting solutions.   ? ?HPI ?[name] presents for  ?Chief Complaint  ?Patient presents with  ? Acute Visit  ?  Cough, runny nose blood streaked, hoarseness, fatigue, disoriented more than usual, not drinking, no fever.   ? ?76 YR OLD FEMALE WITH Parkinson's Disease , mild cognitive impairment ,  depression, presents with rhintis and cough accompanied by hoarseness and increased lethargy without FEVER .  Symptoms started 4 days ago with clear rhinorrhea.  Stared having blood streaked nasal discharge form right nostril  2 days ago.  Caregivers note increased combativeness and disorientation, decreased PO intake  for the past 4 days  ?  ?,   ? ?Outpatient Medications Prior to Visit  ?Medication Sig Dispense Refill  ? aspirin EC 81 MG tablet Take 81 mg by mouth daily. Swallow whole.    ? atorvastatin (LIPITOR) 10 MG tablet TAKE 1 TABLET(10 MG) BY MOUTH AT BEDTIME 90 tablet 3  ? carbidopa-levodopa (SINEMET CR) 50-200 MG tablet Take 1 tablet by mouth at bedtime. Take one tablet at 2p & one at 5:30p    ? carbidopa-levodopa (SINEMET IR) 25-100 MG tablet 2 in the morning, 1 in the afternoon, 1 in the evening (Patient taking differently: Take 1.5 tablets by mouth 4 (four) times daily.) 360 tablet 1  ? DULoxetine (CYMBALTA) 60 MG capsule Take 60 mg by mouth daily.     ? estradiol (ESTRACE) 0.1 MG/GM vaginal cream INSERT 1  APPLICATORFUL VAGINALLY 2 TIMES A WEEK AS DIRECTED 42.5 g 0  ? hydrOXYzine (ATARAX/VISTARIL) 25 MG tablet Take 25 mg by mouth 2 (two) times daily as needed.    ? lamoTRIgine (LAMICTAL) 100 MG tablet Take 100 mg by mouth 2 (two) times daily.    ? OVER THE COUNTER MEDICATION Take 1 capsule by mouth in the morning and at bedtime. Doterra Life Long Vitality Pack    ? pregabalin (LYRICA) 150 MG capsule Take 150 mg by mouth 2 (two) times daily.     ? sertraline (ZOLOFT) 100 MG tablet Take 200 mg by mouth daily.     ? nystatin (MYCOSTATIN/NYSTOP) powder Apply 1 application topically 2 (two) times daily. To rash until resolved. 15 g 0  ? ?No facility-administered medications prior to visit.  ? ? ?Review of Systems; ? ?Patient denies headache, fevers, malaise, unintentional weight loss, skin rash, eye pain, sore throat, dysphagia,  hemoptysis ,  dyspnea, wheezing, chest pain, palpitations, orthopnea, edema, abdominal pain, nausea, melena, diarrhea, constipation, flank pain, dysuria, hematuria, urinary  Frequency, nocturia, numbness, tingling, seizures,  Loss of consciousness,  Tremor, insomnia, depression, anxiety, and suicidal ideation.   ? ? ? ?Objective:  ?BP (!) 92/58 (BP Location: Left Arm, Patient Position: Sitting, Cuff Size: Normal)   Pulse 90   Temp 98.2 ?F (36.8 ?C) (Oral)   Ht '5\' 5"'$  (1.651 m)   Wt 164 lb 12.8 oz (74.8 kg)   SpO2 94%   BMI 27.42 kg/m?  ? ?  BP Readings from Last 3 Encounters:  ?02/26/22 (!) 92/58  ?01/15/22 (!) 86/54  ?01/04/21 (!) 84/58  ? ? ?Wt Readings from Last 3 Encounters:  ?02/26/22 164 lb 12.8 oz (74.8 kg)  ?01/15/22 164 lb 12.8 oz (74.8 kg)  ?01/04/21 172 lb 3.2 oz (78.1 kg)  ? ? ?General appearance: alert, cooperative and appears stated age ?Ears: normal TM's and external ear canals both ears ?Throat: lips, mucosa, and tongue normal; teeth and gums normal ?Neck: no adenopathy, no carotid bruit, supple, symmetrical, trachea midline and thyroid not enlarged, symmetric, no  tenderness/mass/nodules ?Back: symmetric, no curvature. ROM normal. No CVA tenderness. ?Lungs: clear to auscultation bilaterally ?Heart: regular rate and rhythm, S1, S2 normal, no murmur, click, rub or gallop ?Abdomen: soft, non-tender; bowel sounds normal; no masses,  no organomegaly ?Pulses: 2+ and symmetric ?Skin: Skin color, texture, turgor normal. No rashes or lesions ?Lymph nodes: Cervical, supraclavicular, and axillary nodes normal. ? ?Lab Results  ?Component Value Date  ? HGBA1C 6.2 01/10/2022  ? HGBA1C 6.3 10/23/2020  ? HGBA1C 6.1 06/29/2020  ? ? ?Lab Results  ?Component Value Date  ? CREATININE 0.82 01/10/2022  ? CREATININE 0.94 10/23/2020  ? CREATININE 0.77 06/29/2020  ? ? ?Lab Results  ?Component Value Date  ? WBC 6.1 01/10/2022  ? HGB 13.3 01/10/2022  ? HCT 40.7 01/10/2022  ? PLT 260.0 01/10/2022  ? GLUCOSE 135 (H) 01/10/2022  ? CHOL 229 (H) 01/10/2022  ? TRIG 238.0 (H) 01/10/2022  ? HDL 71.30 01/10/2022  ? LDLDIRECT 109.0 01/10/2022  ? [place] 81 07/09/2018  ? ALT 4 01/10/2022  ? AST 15 01/10/2022  ? NA 140 01/10/2022  ? K 4.3 01/10/2022  ? CL 102 01/10/2022  ? CREATININE 0.82 01/10/2022  ? BUN 12 01/10/2022  ? CO2 34 (H) 01/10/2022  ? TSH 3.55 10/23/2020  ? HGBA1C 6.2 01/10/2022  ? MICROALBUR 0.7 06/29/2020  ? ? ? ?Assessment & Plan:  ? ?Problem List Items Addressed This Visit   ? ? Sinusitis, acute maxillary  ?  RIGHT SIDED,  WITH BLOOD STREAKED MUCUS.   EMPIRIC augmentin prednisone ,  Add claritin BID .  In house COVID 19 test was negative.  ?  ?  ? Relevant Medications  ? amoxicillin-clavulanate (AUGMENTIN) 875-125 MG tablet  ? predniSONE (DELTASONE) 10 MG tablet  ? loratadine (CLARITIN) 10 MG tablet  ? benzonatate (TESSALON) 200 MG capsule  ? Proximal leg weakness  ?  PER CAREGIVER,  DR [facility] HAS ORDERED HOME PHYSICAL THERAPY  ?  ?  ? Parkinson's disease ([place])  ?  She requires round the clock assistance due to lethargy and proximal leg weakness.  Her husband is now undergoing chemotherapy for CA.   CCM referral discussed with her primary caregiver [name] ?  ?  ? Relevant Orders  ? AMB Referral to [facility]  ? Cognitive impairment  ?  Rivastigmine was  advised by neurologist,patient was asked  to discuss with Dr Nicolasa Ducking ,  No medication was added per patient preference ?  ?  ? Relevant Orders  ? AMB Referral to [place]  ? ?Other Visit Diagnoses   ? ? Acute cough    -  Primary  ? Relevant Orders  ? POC COVID-19 (Completed)  ? ?  ? ? ?I spent 30 minutes dedicated to the care of this patient on the date of this encounter to include pre-visit review of patient's medical history,  most recent  office visits with me and  patient's specialists,  most recent ER visit/hospitalization, EKG, imaging studies, Face-to-face time with the patient , and post visit ordering of testing and therapeutics.   ? ?Follow-up: No follow-ups on file. ? ? ?[name], MD ?

## 2022-02-26 NOTE — Assessment & Plan Note (Signed)
She requires round the clock assistance due to lethargy and proximal leg weakness.  Her husband is now undergoing chemotherapy for CA.  CCM referral discussed with her primary caregiver [name] ?

## 2022-02-26 NOTE — Assessment & Plan Note (Addendum)
RIGHT SIDED,  WITH BLOOD STREAKED MUCUS.   EMPIRIC augmentin prednisone ,  Add claritin BID .  In house COVID 19 test was negative.  ?

## 2022-02-27 ENCOUNTER — Telehealth: Payer: Self-pay

## 2022-02-27 NOTE — Chronic Care Management (AMB) (Signed)
?  Care Management  ? ?Note ? ?02/27/2022 ?Name: [name] MRN: [id number] DOB: Aug 03, 1946 ? ?Deborah Velazquez is a 76 y.o. year old female who is a primary care patient of Tullo, Aris Everts, MD. I reached out to [name] by phone today offer care coordination services.  ? ?[name] was given information about care management services today including:  ?Care management services include personalized support from designated clinical staff supervised by her physician, including individualized plan of care and coordination with other care providers ?24/7 contact phone numbers for assistance for urgent and routine care needs. ?The patient may stop care management services at any time by phone call to the office staff. ? ?Patient agreed to services and verbal consent obtained.  ? ?Follow up plan: ?Telephone appointment with care management team member scheduled for: ?RN CM 03/04/2022 ?LCSW 03/13/2022 ? ?Noreene Larsson, RMA ?Care Guide, Embedded Care Coordination ?[facility]  ?[place], [place] [address] ?Direct Dial: [phone] ?[email or web address] ?Website: [email or web address]  ? ?

## 2022-03-04 ENCOUNTER — Ambulatory Visit (INDEPENDENT_AMBULATORY_CARE_PROVIDER_SITE_OTHER): Payer: PPO | Admitting: *Deleted

## 2022-03-04 DIAGNOSIS — R4189 Other symptoms and signs involving cognitive functions and awareness: Secondary | ICD-10-CM

## 2022-03-04 DIAGNOSIS — G20 Parkinson's disease: Secondary | ICD-10-CM

## 2022-03-04 DIAGNOSIS — F419 Anxiety disorder, unspecified: Secondary | ICD-10-CM

## 2022-03-04 DIAGNOSIS — E7800 Pure hypercholesterolemia, unspecified: Secondary | ICD-10-CM

## 2022-03-04 NOTE — Chronic Care Management (AMB) (Signed)
?  Care Management  ? ?Follow Up Note ? ? ?03/04/2022 ?Name: Deborah Velazquez MRN: [id number] DOB: 06-14-1946 ? ? ?Referred by: [name], MD ?Reason for referral : Chronic Care Management (Case Closer) ? ? ?Successful contact was made with the patient to discuss care management and care coordination services. Patient declines engagement at this time.   RNCM outreached and spoke with patient's caregiver, [name] and daughter [name], Release of Information on file.  Introduced self and role and CCM programs; Vaughan Basta declines services.  Does agree to speak with CCM Social Worker at upcoming appointment.  Lida and [name] both state patient has 24 hour care with paid sitters and a nurse visiting every 3 months to visit and develop care plan for patient and staff.  Denies any medical or financial or transportation needs at this time.  Endorses husband is undergoing hormone and radiation treatments; he is still alert oriented and independent.  Hopes [facility]ins[facility] Support Group will resume meeting post [place]. ? ?Follow Up Plan: No further follow up required: Declines CCM services at this time ; agrees to speak with CCM Social Worker on 4/19 to verify needs and services. ? ?Hubert Azure RN, MSN ?RN Care Management Coordinator ?[facility] ?[phone] ?[email or web address] ? ? ?

## 2022-03-04 NOTE — Patient Instructions (Signed)
Visit Information ? ?Thank you for allowing me to share the care management and care coordination services that are available to you as part of your health plan and services through your primary care provider and medical home. Please reach out to me at [phone]   if the care management/care coordination team may be of assistance to you in the future.  ? ?Wende Longstreth RN, MSN ?RN Care Management Coordinator ?[facility] ?[phone] ?[email or web address] ? ?

## 2022-03-13 ENCOUNTER — Ambulatory Visit: Payer: PPO | Admitting: *Deleted

## 2022-03-13 ENCOUNTER — Telehealth: Payer: Self-pay | Admitting: *Deleted

## 2022-03-13 DIAGNOSIS — R4189 Other symptoms and signs involving cognitive functions and awareness: Secondary | ICD-10-CM

## 2022-03-13 DIAGNOSIS — G20 Parkinson's disease: Secondary | ICD-10-CM

## 2022-03-13 NOTE — Telephone Encounter (Signed)
?  Care Management  ? ?Follow Up Note ? ? ?03/13/2022 ?Name: Deborah Velazquez MRN: [id number] DOB: Jul 11, 1946 ? ? ?Referred by: [name], MD ?Reason for referral : Care Coordination ? ? ?An unsuccessful telephone outreach was attempted today. The patient was referred to the case management team for assistance with care management and care coordination.  ? ?Follow Up Plan: Telephone follow up appointment with care management team member to be re-scheduled by Careguide ? ?Ralphael Southgate, LCSW ?[facility] ?[phone] ? ? ?

## 2022-03-13 NOTE — Chronic Care Management (AMB) (Signed)
Care Management ?Clinical Social Work Note ? ?03/13/2022 ?Name: Deborah Velazquez MRN: [id number] DOB: Dec 07, 1945 ? ?Deborah Velazquez is a 76 y.o. year old female who is a primary care patient of Deborah Mc, MD.  The Care Management team was consulted for assistance with chronic disease management and coordination needs. ? ?Collaboration with patient's friend and caregiver Deborah Velazquez  for initial visit in response to provider referral for social work chronic care management and care coordination services ? ?Consent to Services:  ?Deborah Velazquez's caregiver was given information about Care Management services today including:  ?Care Management services includes personalized support from designated clinical staff supervised by her physician, including individualized plan of care and coordination with other care providers ?24/7 contact phone numbers for assistance for urgent and routine care needs. ?The patient may stop case management services at any time by phone call to the office staff. ? ?Patient's caregiver declined  services at this. Per patient's caregiver, they have 24 hour care in place for patient and are in no need of any additional community resources at this time.  She has three different agencies in place as well as a nurse that comes quarterly. They have been trained and able to care for patient, however at times there are staffing challenges. It is at that time that patient's caregiver/friend Deborah Velazquez steps in to assist with her care. Although challenging, they are committed to patient remaining in the home for as long as possible. They are not interested in a higher level of care at this time. This social worker's contact information provided in the event that any additional community resource needs may arise. ? ? ?SDOH (Social Determinants of Health) assessments and interventions performed:   ? ?Advanced Directives Status: Not addressed in this encounter. ? ?Care Plan ? ?Allergies  ?Allergen Reactions  ?  Pseudoephedrine Hcl Anxiety and Other (See Comments)  ?  Nervous  ? Sudafed [Pseudoephedrine] Anxiety and Other (See Comments)  ?  Nervous  ? ? ?Outpatient Encounter Medications as of 03/13/2022  ?Medication Sig Note  ? amoxicillin-clavulanate (AUGMENTIN) 875-125 MG tablet Take 1 tablet by mouth 2 (two) times daily.   ? aspirin EC 81 MG tablet Take 81 mg by mouth daily. Swallow whole.   ? atorvastatin (LIPITOR) 10 MG tablet TAKE 1 TABLET(10 MG) BY MOUTH AT BEDTIME   ? benzonatate (TESSALON) 200 MG capsule Take 1 capsule (200 mg total) by mouth 3 (three) times daily as needed for cough.   ? carbidopa-levodopa (SINEMET CR) 50-200 MG tablet Take 1 tablet by mouth at bedtime. Take one tablet at 2p & one at 5:30p   ? carbidopa-levodopa (SINEMET IR) 25-100 MG tablet 2 in the morning, 1 in the afternoon, 1 in the evening (Patient taking differently: Take 1.5 tablets by mouth 4 (four) times daily.)   ? DULoxetine (CYMBALTA) 60 MG capsule Take 60 mg by mouth daily.    ? estradiol (ESTRACE) 0.1 MG/GM vaginal cream INSERT 1 APPLICATORFUL VAGINALLY 2 TIMES A WEEK AS DIRECTED   ? hydrOXYzine (ATARAX/VISTARIL) 25 MG tablet Take 25 mg by mouth 2 (two) times daily as needed. 10/25/2020: PRN  ? lamoTRIgine (LAMICTAL) 100 MG tablet Take 100 mg by mouth 2 (two) times daily.   ? loratadine (CLARITIN) 10 MG tablet Take 1 tablet (10 mg total) by mouth 2 (two) times daily.   ? nystatin (MYCOSTATIN/NYSTOP) powder Apply 1 application. topically 2 (two) times daily. To rash until resolved.   ? OVER THE COUNTER MEDICATION Take 1 capsule  by mouth in the morning and at bedtime. Doterra Life Long Vitality Pack   ? predniSONE (DELTASONE) 10 MG tablet 6 tablets on Day 1 , then reduce by 1 tablet daily until gone   ? pregabalin (LYRICA) 150 MG capsule Take 150 mg by mouth 2 (two) times daily.    ? sertraline (ZOLOFT) 100 MG tablet Take 200 mg by mouth daily.    ? ?No facility-administered encounter medications on file as of 03/13/2022.  ? ? ?Patient  Active Problem List  ? Diagnosis Date Noted  ? Sinusitis, acute maxillary 02/26/2022  ? Candidiasis of skin 01/06/2021  ? Lethargy 10/25/2020  ? Encounter for preventive health examination 07/01/2020  ? Proximal leg weakness 07/01/2020  ? Allergic rhinitis due to pollen 03/06/2019  ? Atrophic vaginitis 11/25/2018  ? Dysuria 11/25/2018  ? Spondylolisthesis of lumbosacral region 10/07/2018  ? Neuropathy 07/13/2018  ? Cognitive impairment 09/22/2017  ? Parkinson's disease ([place]) 08/19/2017  ? Prediabetes 07/26/2016  ? Pure hypercholesterolemia 12/23/2014  ? Anxiety 06/24/2014  ? ? ?Conditions to be addressed/monitored:  Parkinson's ; Inability to perform ADL's independently and Inability to perform IADL's independently ? ?There are no care plans that you recently modified to display for this patient. ?  ? ?Follow Up Plan:  Patient's caregiver to contact this social worker with any additional community resource needs. ? ?[employer], LCSW ?[place] ?[phone] ? ?

## 2022-03-24 DIAGNOSIS — E7800 Pure hypercholesterolemia, unspecified: Secondary | ICD-10-CM

## 2022-05-07 ENCOUNTER — Other Ambulatory Visit: Payer: Self-pay

## 2022-05-07 MED ORDER — ATORVASTATIN CALCIUM 10 MG PO TABS: 10.0000 mg | ORAL_TABLET | Freq: Every day | ORAL | 3 refills | Status: DC

## 2022-06-03 DIAGNOSIS — F411 Generalized anxiety disorder: Secondary | ICD-10-CM | POA: Diagnosis not present

## 2022-06-03 DIAGNOSIS — F332 Major depressive disorder, recurrent severe without psychotic features: Secondary | ICD-10-CM | POA: Diagnosis not present

## 2022-06-03 DIAGNOSIS — G4700 Insomnia, unspecified: Secondary | ICD-10-CM | POA: Diagnosis not present

## 2022-07-01 DIAGNOSIS — G20 Parkinson's disease: Secondary | ICD-10-CM | POA: Diagnosis not present

## 2022-07-01 DIAGNOSIS — F419 Anxiety disorder, unspecified: Secondary | ICD-10-CM | POA: Diagnosis not present

## 2022-07-01 DIAGNOSIS — G4719 Other hypersomnia: Secondary | ICD-10-CM | POA: Diagnosis not present

## 2022-07-01 DIAGNOSIS — G5793 Unspecified mononeuropathy of bilateral lower limbs: Secondary | ICD-10-CM | POA: Diagnosis not present

## 2022-07-01 DIAGNOSIS — R4689 Other symptoms and signs involving appearance and behavior: Secondary | ICD-10-CM | POA: Diagnosis not present

## 2022-07-01 DIAGNOSIS — G4752 REM sleep behavior disorder: Secondary | ICD-10-CM | POA: Diagnosis not present

## 2022-07-01 DIAGNOSIS — F0280 Dementia in other diseases classified elsewhere without behavioral disturbance: Secondary | ICD-10-CM | POA: Diagnosis not present

## 2022-07-16 ENCOUNTER — Ambulatory Visit (INDEPENDENT_AMBULATORY_CARE_PROVIDER_SITE_OTHER): Payer: PPO | Admitting: Internal Medicine

## 2022-07-16 ENCOUNTER — Encounter: Payer: Self-pay | Admitting: Internal Medicine

## 2022-07-16 VITALS — BP 100/66 | HR 76 | Temp 98.1°F | Ht 65.0 in | Wt 164.8 lb

## 2022-07-16 DIAGNOSIS — G20 Parkinson's disease: Secondary | ICD-10-CM | POA: Diagnosis not present

## 2022-07-16 DIAGNOSIS — R5383 Other fatigue: Secondary | ICD-10-CM

## 2022-07-16 DIAGNOSIS — R7303 Prediabetes: Secondary | ICD-10-CM

## 2022-07-16 DIAGNOSIS — Z23 Encounter for immunization: Secondary | ICD-10-CM

## 2022-07-16 MED ORDER — ZOSTER VAC RECOMB ADJUVANTED 50 MCG/0.5ML IM SUSR: 0.5000 mL | Freq: Once | INTRAMUSCULAR | 0 refills | Status: AC

## 2022-07-16 NOTE — Assessment & Plan Note (Addendum)
Improving a1c with diet

## 2022-07-16 NOTE — Progress Notes (Addendum)
Subjective:  Patient ID: Deborah Velazquez, female    DOB: May 09, 1946  Age: 76 y.o. MRN: [id number]  CC: The primary encounter diagnosis was Prediabetes. Diagnoses of Need for shingles vaccine, Other fatigue, Parkinson's disease ([place]), and Lethargy were also pertinent to this visit.   HPI Deborah Velazquez presents for follow up  on chronic conditions including Parkinson's disease with Lew Body disease,  prediabetes,  and depression with anxiety.   Chief Complaint  Patient presents with   Follow-up    6 month follow up    1) Parkinson's Disease with Lewy Body disease:   reviewed her activity level with patient and  caregiver.  She Has days of prolonged hypersomnolence.  She refuses to exercise with the aides,  and has developed truncal weakness.  "Why can't I sit up in my chair anymore?"  Explained the concept of muscle atrophy to patient and encouraged her to start working with her caregivers to strengthen her core.  Medication changes by Dr Manuella Ghazi from August 7 visit  were reviewed with patient. , sinemet changed, modafinil added.  Still struggling with finding  mature aides for weekend assistance. Patient has had personality clashes which have resulted in a bit of a revolving door on the staff.    Has delayed start of modafinil   Constipation improved .  Moving bowels daily now.    2) Prediabetes:  Her aide is trying to give her a healthy, organic , vegetable based diet.  Husband tends to cook "country" and overfeeds her,   3) Psych:  she is keeping her appts with Dr Nicolasa Ducking.    4) mobility :  She has an electric bed and 2 Research officer, trade [employer]. Refuses to exercise at times.    Health maintenance : she has declined mammograms since 2019.  Last colon normal 2016    Outpatient Medications Prior to Visit  Medication Sig Dispense Refill   carbidopa-levodopa (SINEMET CR) 50-200 MG tablet Take 1 tablet by mouth at bedtime. Take one tablet at 2p & one at 5:30p     DULoxetine (CYMBALTA) 60 MG capsule  Take 60 mg by mouth daily.      estradiol (ESTRACE) 0.1 MG/GM vaginal cream INSERT 1 APPLICATORFUL VAGINALLY 2 TIMES A WEEK AS DIRECTED 42.5 g 0   hydrOXYzine (ATARAX/VISTARIL) 25 MG tablet Take 25 mg by mouth 2 (two) times daily as needed.     lamoTRIgine (LAMICTAL) 100 MG tablet Take 100 mg by mouth 2 (two) times daily.     nystatin (MYCOSTATIN/NYSTOP) powder Apply 1 application. topically 2 (two) times daily. To rash until resolved. 15 g 0   OVER THE COUNTER MEDICATION Take 1 capsule by mouth in the morning and at bedtime. Doterra Life Long Vitality Pack     pregabalin (LYRICA) 150 MG capsule Take 150 mg by mouth 2 (two) times daily.      QUEtiapine (SEROQUEL) 25 MG tablet Take 12.5 mg by mouth 2 (two) times daily.     sertraline (ZOLOFT) 100 MG tablet Take 200 mg by mouth daily.      aspirin EC 81 MG tablet Take 81 mg by mouth daily. Swallow whole.     atorvastatin (LIPITOR) 10 MG tablet Take 1 tablet (10 mg total) by mouth at bedtime. 90 tablet 3   carbidopa-levodopa (SINEMET IR) 25-100 MG tablet 2 in the morning, 1 in the afternoon, 1 in the evening (Patient not taking: Reported on 07/16/2022) 360 tablet 1   amoxicillin-clavulanate (AUGMENTIN) 875-125 MG tablet Take  1 tablet by mouth 2 (two) times daily. (Patient not taking: Reported on 07/16/2022) 14 tablet 0   benzonatate (TESSALON) 200 MG capsule Take 1 capsule (200 mg total) by mouth 3 (three) times daily as needed for cough. (Patient not taking: Reported on 07/16/2022) 60 capsule 1   loratadine (CLARITIN) 10 MG tablet Take 1 tablet (10 mg total) by mouth 2 (two) times daily. (Patient not taking: Reported on 07/16/2022) 60 tablet 2   predniSONE (DELTASONE) 10 MG tablet 6 tablets on Day 1 , then reduce by 1 tablet daily until gone (Patient not taking: Reported on 07/16/2022) 21 tablet 0   No facility-administered medications prior to visit.    Review of Systems;  Patient denies headache, fevers, malaise, unintentional weight loss, skin  rash, eye pain, sinus congestion and sinus pain, sore throat, dysphagia,  hemoptysis , cough, dyspnea, wheezing, chest pain, palpitations, orthopnea, edema, abdominal pain, nausea, melena, diarrhea, constipation, flank pain, dysuria, hematuria, urinary  Frequency, nocturia, numbness, tingling, seizures,  Focal weakness, Loss of consciousness,  Tremor, insomnia, depression, anxiety, and suicidal ideation.      Objective:  BP 100/66 (BP Location: Left Arm, Patient Position: Sitting, Cuff Size: Normal)   Pulse 76   Temp 98.1 F (36.7 C) (Oral)   Ht '5\' 5"'$  (1.651 m)   Wt 164 lb 12.8 oz (74.8 kg)   SpO2 95%   BMI 27.42 kg/m   BP Readings from Last 3 Encounters:  07/16/22 100/66  02/26/22 (!) 92/58  01/15/22 (!) 86/54    Wt Readings from Last 3 Encounters:  07/16/22 164 lb 12.8 oz (74.8 kg)  02/26/22 164 lb 12.8 oz (74.8 kg)  01/15/22 164 lb 12.8 oz (74.8 kg)    General appearance: alert, cooperative and appears older than stated age. Seated in a wheelchair,  leaning to the right  Ears: normal TM's and external ear canals both ears Throat: lips, mucosa, and tongue normal; teeth and gums normal Neck: no adenopathy, no carotid bruit, supple, symmetrical, trachea midline and thyroid not enlarged, symmetric, no tenderness/mass/nodules Back: symmetric, no curvature. ROM normal. No CVA tenderness. Lungs: clear to auscultation bilaterally Heart: regular rate and rhythm, S1, S2 normal, no murmur, click, rub or gallop Abdomen: soft, non-tender; bowel sounds normal; no masses,  no organomegaly Pulses: 2+ and symmetric Skin: Skin color, texture, turgor normal. No rashes or lesions Lymph nodes: Cervical, supraclavicular, and axillary nodes normal. Neuro:  awake and interactive with flat  mood and affect. Higher cortical functions are normal. Speech is clear without word-finding difficulty or dysarthria. Extraocular movements are intact. Visual fields of both eyes are grossly intact. Sensation to  light touch is grossly intact bilaterally of upper and lower extremities. Motor examination shows 4/5 symmetric hand grip and upper extremity and 4/5 lower extremity strength. She was not able to stand without assistance.  Gait was not tested    Lab Results  Component Value Date   HGBA1C 6.4 07/16/2022   HGBA1C 6.2 01/10/2022   HGBA1C 6.3 10/23/2020    Lab Results  Component Value Date   CREATININE 0.86 07/16/2022   CREATININE 0.82 01/10/2022   CREATININE 0.94 10/23/2020    Lab Results  Component Value Date   WBC 6.1 01/10/2022   HGB 13.3 01/10/2022   HCT 40.7 01/10/2022   PLT 260.0 01/10/2022   GLUCOSE 120 (H) 07/16/2022   CHOL 247 (H) 07/16/2022   TRIG 293.0 (H) 07/16/2022   HDL 65.40 07/16/2022   LDLDIRECT 129.0 07/16/2022   LDLCALC 81  07/09/2018   ALT 5 07/16/2022   AST 15 07/16/2022   NA 139 07/16/2022   K 4.7 07/16/2022   CL 103 07/16/2022   CREATININE 0.86 07/16/2022   BUN 15 07/16/2022   CO2 25 07/16/2022   TSH 2.47 07/16/2022   HGBA1C 6.4 07/16/2022   MICROALBUR 0.7 06/29/2020    MR BRAIN WO CONTRAST  Result Date: 10/02/2021 CLINICAL DATA:  Cognitive impairment.  Primary parkinsonism. EXAM: MRI HEAD WITHOUT CONTRAST TECHNIQUE: Multiplanar, multiecho pulse sequences of the brain and surrounding structures were obtained without intravenous contrast. COMPARISON:  MRI of the brain April 30, 2019. FINDINGS: Brain: No acute infarction, hemorrhage, extra-axial collection or mass lesion. Scattered and confluent foci of T2 hyperintensity are seen within the white matter of the cerebral hemispheres and within the pons, nonspecific, likely related to chronic small vessel ischemia, not significantly changed from prior MRI. Prominence of the supratentorial ventricles and sylvian fissures with sparing of the high convexity sulci, may be seen in the setting of normal pressure hydrocephalus in the appropriate clinical scenario. Vascular: Normal flow voids. Skull and upper cervical  spine: Normal marrow signal. Sinuses/Orbits: Bilateral lens surgery. Paranasal sinuses are essentially clear. Other: None. IMPRESSION: 1. Moderate chronic microvascular ischemic changes the white matter, stable. 2. Prominence of the supratentorial ventricles and sylvian fissures with sparing of the high convexity sulci, may be seen in the setting of normal pressure hydrocephalus in the appropriate clinical scenario. This has not significantly changed since prior MRI. Electronically Signed   By: Pedro Earls M.D.   On: 10/02/2021 17:31    Assessment & Plan:   Problem List Items Addressed This Visit     Lethargy    She has been prescribed modafinil which she has not started yet/       Parkinson's disease [facility])    Reviewed recent neurologist follow up, medications changes . She has developed truncal weakness.  Encouraged her to cooperate with staff when advised to exercise to improve core strength       Prediabetes - Primary    Improving a1c with diet        Relevant Orders   Comprehensive metabolic panel (Completed)   Hemoglobin A1c (Completed)   Lipid panel (Completed)   Other Visit Diagnoses     Need for shingles vaccine       Other fatigue       Relevant Orders   TSH (Completed)       I spent a total of  36 minutes with this patient in a face to face visit on the date of this encounter reviewing the last office visit with me ,  her  most recent with her neurologist  Dr Manuella Ghazi ,  patient's diet and eating habits,  most recent imaging study ,  providing counselling to patient on her need to engage in exercise regularly,  and post visit ordering of testing and therapeutics.    Follow-up: Return in about 6 months (around 01/16/2023).   [name], MD

## 2022-07-16 NOTE — Patient Instructions (Addendum)
I hear that "you're not ready to go" That's good, 'cuz "we're all out of shots" at the moment!  YOu've lost 8 lbs since your last visit.    Good job.  Sounds like your staff is giving you a healthy diet

## 2022-07-16 NOTE — Assessment & Plan Note (Signed)
She has been prescribed modafinil which she has not started yet/

## 2022-07-16 NOTE — Assessment & Plan Note (Addendum)
Reviewed recent neurologist follow up, medications changes . She has developed truncal weakness.  Encouraged her to cooperate with staff when advised to exercise to improve core strength

## 2022-07-17 LAB — COMPREHENSIVE METABOLIC PANEL
ALT: 5 U/L (ref 0–35)
AST: 15 U/L (ref 0–37)
Albumin: 4.4 g/dL (ref 3.5–5.2)
Alkaline Phosphatase: 55 U/L (ref 39–117)
BUN: 15 mg/dL (ref 6–23)
CO2: 25 mEq/L (ref 19–32)
Calcium: 9.8 mg/dL (ref 8.4–10.5)
Chloride: 103 mEq/L (ref 96–112)
Creatinine, Ser: 0.86 mg/dL (ref 0.40–1.20)
GFR: 64.88 mL/min (ref >60.00)
Glucose, Bld: 120 mg/dL — ABNORMAL HIGH (ref 70–99)
Potassium: 4.7 mEq/L (ref 3.5–5.1)
Sodium: 139 mEq/L (ref 135–145)
Total Bilirubin: 0.4 mg/dL (ref 0.2–1.2)
Total Protein: 6.7 g/dL (ref 6.0–8.3)

## 2022-07-17 LAB — LIPID PANEL
Cholesterol: 247 mg/dL — ABNORMAL HIGH (ref 0–200)
HDL: 65.4 mg/dL (ref >39.00)
NonHDL: 182.07
Total CHOL/HDL Ratio: 4
Triglycerides: 293 mg/dL — ABNORMAL HIGH (ref 0.0–149.0)
VLDL: 58.6 mg/dL — ABNORMAL HIGH (ref 0.0–40.0)

## 2022-07-17 LAB — TSH: TSH: 2.47 u[IU]/mL (ref 0.35–5.50)

## 2022-07-17 LAB — HEMOGLOBIN A1C: Hgb A1c MFr Bld: 6.4 % (ref 4.6–6.5)

## 2022-07-17 LAB — LDL CHOLESTEROL, DIRECT: Direct LDL: 129 mg/dL

## 2022-09-11 ENCOUNTER — Telehealth: Payer: Self-pay

## 2022-09-11 NOTE — Patient Outreach (Signed)
  Care Coordination   09/11/2022 Name: Deborah Velazquez MRN: [id number] DOB: 1946-06-24   Care Coordination Outreach Attempts:  An unsuccessful telephone outreach was attempted today to offer the patient information about available care coordination services as a benefit of their health plan.   Follow Up Plan:  Additional outreach attempts will be made to offer the patient care coordination information and services.   Encounter Outcome:  No Answer  Care Coordination Interventions Activated:  No   Care Coordination Interventions:  No, not indicated    Quinn Plowman RN,BSN,CCM [place] [phone] direct line

## 2022-09-18 DIAGNOSIS — G4700 Insomnia, unspecified: Secondary | ICD-10-CM | POA: Diagnosis not present

## 2022-09-18 DIAGNOSIS — F332 Major depressive disorder, recurrent severe without psychotic features: Secondary | ICD-10-CM | POA: Diagnosis not present

## 2022-09-18 DIAGNOSIS — F411 Generalized anxiety disorder: Secondary | ICD-10-CM | POA: Diagnosis not present

## 2022-10-13 ENCOUNTER — Other Ambulatory Visit: Payer: Self-pay | Admitting: Internal Medicine

## 2022-10-13 DIAGNOSIS — B372 Candidiasis of skin and nail: Secondary | ICD-10-CM

## 2023-01-07 ENCOUNTER — Telehealth: Payer: Self-pay

## 2023-01-07 NOTE — Patient Outreach (Signed)
  Care Coordination   Initial Visit Note   01/07/2023 Name: Deborah Velazquez MRN: [id number] DOB: 08-16-1946  Deborah Velazquez is a 77 y.o. year old female who sees Derrel Nip, Aris Everts, MD for primary care. I  spoke with caregiver/ dpr [name]  What matters to the patients health and wellness today?  Caregiver states patient is not in need of any services at this time.  She states patient has a Teacher, music and psychologist, 24/7 nursing care and has transportation.      Goals Addressed             This Visit's Progress    care coordination activities - no follow up needed       Interventions Today    Flowsheet Row Most Recent Value  Chronic Disease   Chronic disease during today's visit Other  [Parkinson's Disease]  General Interventions   General Interventions Discussed/Reviewed General Interventions Discussed  [annual wellness exam, SDOH assessed]      Care coordination interventions Care coordination program/ services discussed  Social determinants of health survey completed Annual wellness visit discussed and caregiver advised to contact provider office to schedule Patient / caregiver advised to contact primary care provider office  if care coordination services needed in the future         SDOH assessments and interventions completed:  Yes  SDOH Interventions Today    Flowsheet Row Most Recent Value  SDOH Interventions   Food Insecurity Interventions Intervention Not Indicated  Housing Interventions Intervention Not Indicated  Transportation Interventions Intervention Not Indicated        Care Coordination Interventions:  No, not indicated   Follow up plan: No further intervention required.   Encounter Outcome:  Pt. Visit Completed   Quinn Plowman RN,BSN,CCM [place] [phone] direct line

## 2023-01-17 ENCOUNTER — Ambulatory Visit: Payer: PPO | Admitting: Internal Medicine

## 2023-02-05 DIAGNOSIS — F0280 Dementia in other diseases classified elsewhere without behavioral disturbance: Secondary | ICD-10-CM | POA: Diagnosis not present

## 2023-02-05 DIAGNOSIS — R569 Unspecified convulsions: Secondary | ICD-10-CM | POA: Diagnosis not present

## 2023-02-05 DIAGNOSIS — G4719 Other hypersomnia: Secondary | ICD-10-CM | POA: Diagnosis not present

## 2023-02-05 DIAGNOSIS — G4752 REM sleep behavior disorder: Secondary | ICD-10-CM | POA: Diagnosis not present

## 2023-02-05 DIAGNOSIS — G20A1 Parkinson's disease without dyskinesia, without mention of fluctuations: Secondary | ICD-10-CM | POA: Diagnosis not present

## 2023-02-05 DIAGNOSIS — G5793 Unspecified mononeuropathy of bilateral lower limbs: Secondary | ICD-10-CM | POA: Diagnosis not present

## 2023-02-05 DIAGNOSIS — F419 Anxiety disorder, unspecified: Secondary | ICD-10-CM | POA: Diagnosis not present

## 2023-02-17 DIAGNOSIS — F411 Generalized anxiety disorder: Secondary | ICD-10-CM | POA: Diagnosis not present

## 2023-02-17 DIAGNOSIS — F332 Major depressive disorder, recurrent severe without psychotic features: Secondary | ICD-10-CM | POA: Diagnosis not present

## 2023-02-17 DIAGNOSIS — G4700 Insomnia, unspecified: Secondary | ICD-10-CM | POA: Diagnosis not present

## 2023-02-25 ENCOUNTER — Ambulatory Visit: Payer: PPO | Admitting: Internal Medicine

## 2023-03-07 ENCOUNTER — Ambulatory Visit: Payer: PPO | Admitting: Internal Medicine

## 2023-03-17 ENCOUNTER — Emergency Department: Payer: PPO

## 2023-03-17 ENCOUNTER — Other Ambulatory Visit: Payer: Self-pay

## 2023-03-17 ENCOUNTER — Telehealth: Payer: Self-pay

## 2023-03-17 ENCOUNTER — Inpatient Hospital Stay
Admission: EM | Admit: 2023-03-17 | Discharge: 2023-03-20 | DRG: 193 | Disposition: A | Discharge status: Home Health | Payer: PPO | Attending: Internal Medicine | Admitting: Internal Medicine

## 2023-03-17 ENCOUNTER — Encounter: Payer: Self-pay | Admitting: Internal Medicine

## 2023-03-17 DIAGNOSIS — Z79899 Other long term (current) drug therapy: Secondary | ICD-10-CM

## 2023-03-17 DIAGNOSIS — Z6827 Body mass index (BMI) 27.0-27.9, adult: Secondary | ICD-10-CM

## 2023-03-17 DIAGNOSIS — E7800 Pure hypercholesterolemia, unspecified: Secondary | ICD-10-CM | POA: Diagnosis not present

## 2023-03-17 DIAGNOSIS — G3183 Dementia with Lewy bodies: Secondary | ICD-10-CM | POA: Diagnosis not present

## 2023-03-17 DIAGNOSIS — Z833 Family history of diabetes mellitus: Secondary | ICD-10-CM | POA: Diagnosis not present

## 2023-03-17 DIAGNOSIS — G2581 Restless legs syndrome: Secondary | ICD-10-CM | POA: Diagnosis not present

## 2023-03-17 DIAGNOSIS — Z79890 Hormone replacement therapy (postmenopausal): Secondary | ICD-10-CM

## 2023-03-17 DIAGNOSIS — K5900 Constipation, unspecified: Secondary | ICD-10-CM | POA: Diagnosis not present

## 2023-03-17 DIAGNOSIS — F02A18 Dementia in other diseases classified elsewhere, mild, with other behavioral disturbance: Secondary | ICD-10-CM | POA: Diagnosis not present

## 2023-03-17 DIAGNOSIS — Z8249 Family history of ischemic heart disease and other diseases of the circulatory system: Secondary | ICD-10-CM | POA: Diagnosis not present

## 2023-03-17 DIAGNOSIS — F0283 Dementia in other diseases classified elsewhere, unspecified severity, with mood disturbance: Secondary | ICD-10-CM | POA: Insufficient documentation

## 2023-03-17 DIAGNOSIS — Z803 Family history of malignant neoplasm of breast: Secondary | ICD-10-CM

## 2023-03-17 DIAGNOSIS — E876 Hypokalemia: Secondary | ICD-10-CM | POA: Insufficient documentation

## 2023-03-17 DIAGNOSIS — J123 Human metapneumovirus pneumonia: Secondary | ICD-10-CM | POA: Diagnosis not present

## 2023-03-17 DIAGNOSIS — Z7982 Long term (current) use of aspirin: Secondary | ICD-10-CM

## 2023-03-17 DIAGNOSIS — F02B2 Dementia in other diseases classified elsewhere, moderate, with psychotic disturbance: Secondary | ICD-10-CM | POA: Diagnosis not present

## 2023-03-17 DIAGNOSIS — R059 Cough, unspecified: Secondary | ICD-10-CM | POA: Diagnosis not present

## 2023-03-17 DIAGNOSIS — G9341 Metabolic encephalopathy: Secondary | ICD-10-CM | POA: Insufficient documentation

## 2023-03-17 DIAGNOSIS — Z743 Need for continuous supervision: Secondary | ICD-10-CM | POA: Diagnosis not present

## 2023-03-17 DIAGNOSIS — J690 Pneumonitis due to inhalation of food and vomit: Secondary | ICD-10-CM | POA: Diagnosis not present

## 2023-03-17 DIAGNOSIS — Z888 Allergy status to other drugs, medicaments and biological substances status: Secondary | ICD-10-CM | POA: Diagnosis not present

## 2023-03-17 DIAGNOSIS — G20A1 Parkinson's disease without dyskinesia, without mention of fluctuations: Secondary | ICD-10-CM | POA: Diagnosis not present

## 2023-03-17 DIAGNOSIS — F0280 Dementia in other diseases classified elsewhere without behavioral disturbance: Secondary | ICD-10-CM | POA: Insufficient documentation

## 2023-03-17 DIAGNOSIS — Z8673 Personal history of transient ischemic attack (TIA), and cerebral infarction without residual deficits: Secondary | ICD-10-CM

## 2023-03-17 DIAGNOSIS — E663 Overweight: Secondary | ICD-10-CM | POA: Diagnosis not present

## 2023-03-17 DIAGNOSIS — N3289 Other specified disorders of bladder: Secondary | ICD-10-CM | POA: Diagnosis not present

## 2023-03-17 DIAGNOSIS — R051 Acute cough: Secondary | ICD-10-CM | POA: Diagnosis not present

## 2023-03-17 DIAGNOSIS — Z1152 Encounter for screening for COVID-19: Secondary | ICD-10-CM

## 2023-03-17 DIAGNOSIS — Z820 Family history of epilepsy and other diseases of the nervous system: Secondary | ICD-10-CM | POA: Diagnosis not present

## 2023-03-17 DIAGNOSIS — R519 Headache, unspecified: Secondary | ICD-10-CM | POA: Diagnosis not present

## 2023-03-17 DIAGNOSIS — F02818 Dementia in other diseases classified elsewhere, unspecified severity, with other behavioral disturbance: Secondary | ICD-10-CM | POA: Diagnosis present

## 2023-03-17 DIAGNOSIS — J9601 Acute respiratory failure with hypoxia: Secondary | ICD-10-CM | POA: Diagnosis not present

## 2023-03-17 DIAGNOSIS — R062 Wheezing: Secondary | ICD-10-CM | POA: Diagnosis not present

## 2023-03-17 DIAGNOSIS — R531 Weakness: Secondary | ICD-10-CM | POA: Diagnosis not present

## 2023-03-17 DIAGNOSIS — I10 Essential (primary) hypertension: Secondary | ICD-10-CM | POA: Diagnosis not present

## 2023-03-17 HISTORY — DX: Pneumonitis due to inhalation of food and vomit: J69.0

## 2023-03-17 LAB — URINALYSIS, ROUTINE W REFLEX MICROSCOPIC
Bilirubin Urine: NEGATIVE
Glucose, UA: NEGATIVE mg/dL
Hgb urine dipstick: NEGATIVE
Ketones, ur: 5 mg/dL — AB
Nitrite: NEGATIVE
Protein, ur: 30 mg/dL — AB
RBC / HPF: >50 RBC/hpf (ref 0–5)
Specific Gravity, Urine: >1.046 — ABNORMAL HIGH (ref 1.005–1.030)
WBC, UA: >50 WBC/hpf (ref 0–5)
pH: 6 (ref 5.0–8.0)

## 2023-03-17 LAB — BASIC METABOLIC PANEL
Anion gap: 8 (ref 5–15)
BUN: 12 mg/dL (ref 8–23)
CO2: 27 mmol/L (ref 22–32)
Calcium: 8.8 mg/dL — ABNORMAL LOW (ref 8.9–10.3)
Chloride: 105 mmol/L (ref 98–111)
Creatinine, Ser: 0.65 mg/dL (ref 0.44–1.00)
GFR, Estimated: >60 mL/min (ref >60)
Glucose, Bld: 118 mg/dL — ABNORMAL HIGH (ref 70–99)
Potassium: 3.9 mmol/L (ref 3.5–5.1)
Sodium: 140 mmol/L (ref 135–145)

## 2023-03-17 LAB — CBC
HCT: 40.4 % (ref 36.0–46.0)
Hemoglobin: 12.9 g/dL (ref 12.0–15.0)
MCH: 29.7 pg (ref 26.0–34.0)
MCHC: 31.9 g/dL (ref 30.0–36.0)
MCV: 93.1 fL (ref 80.0–100.0)
Platelets: 230 10*3/uL (ref 150–400)
RBC: 4.34 MIL/uL (ref 3.87–5.11)
RDW: 14.6 % (ref 11.5–15.5)
WBC: 4.8 10*3/uL (ref 4.0–10.5)
nRBC: 0 % (ref 0.0–0.2)

## 2023-03-17 LAB — RESPIRATORY PANEL BY PCR

## 2023-03-17 LAB — HEPATIC FUNCTION PANEL
ALT: 9 U/L (ref 0–44)
AST: 21 U/L (ref 15–41)
Albumin: 3.6 g/dL (ref 3.5–5.0)
Alkaline Phosphatase: 58 U/L (ref 38–126)
Bilirubin, Direct: <0.1 mg/dL (ref 0.0–0.2)
Total Bilirubin: 0.6 mg/dL (ref 0.3–1.2)
Total Protein: 7.1 g/dL (ref 6.5–8.1)

## 2023-03-17 LAB — SARS CORONAVIRUS 2 BY RT PCR: SARS Coronavirus 2 by RT PCR: NEGATIVE

## 2023-03-17 LAB — TROPONIN I (HIGH SENSITIVITY): Troponin I (High Sensitivity): 3 ng/L (ref <18)

## 2023-03-17 LAB — BRAIN NATRIURETIC PEPTIDE: B Natriuretic Peptide: 27.8 pg/mL (ref 0.0–100.0)

## 2023-03-17 LAB — PROCALCITONIN: Procalcitonin: <0.1 ng/mL

## 2023-03-17 MED ORDER — SERTRALINE HCL 50 MG PO TABS
200.0000 mg | ORAL_TABLET | Freq: Every day | ORAL | Status: DC
  Administered 2023-03-18 – 2023-03-20 (×3): 200 mg via ORAL
  Filled 2023-03-17 (×3): qty 4

## 2023-03-17 MED ORDER — SODIUM CHLORIDE 0.9 % IV SOLN
2.0000 g | INTRAVENOUS | Status: DC
  Administered 2023-03-17: 2 g via INTRAVENOUS
  Filled 2023-03-17: qty 20

## 2023-03-17 MED ORDER — ATORVASTATIN CALCIUM 20 MG PO TABS
10.0000 mg | ORAL_TABLET | Freq: Every day | ORAL | Status: DC
  Administered 2023-03-17 – 2023-03-19 (×3): 10 mg via ORAL
  Filled 2023-03-17 (×3): qty 1

## 2023-03-17 MED ORDER — IOHEXOL 350 MG/ML SOLN
100.0000 mL | Freq: Once | INTRAVENOUS | Status: AC | PRN
  Administered 2023-03-17: 100 mL via INTRAVENOUS

## 2023-03-17 MED ORDER — SODIUM CHLORIDE 0.9 % IV SOLN: 500.0000 mg | INTRAVENOUS | Status: DC

## 2023-03-17 MED ORDER — METRONIDAZOLE 500 MG/100ML IV SOLN: 500.0000 mg | Freq: Once | INTRAVENOUS | Status: DC

## 2023-03-17 MED ORDER — ENOXAPARIN SODIUM 40 MG/0.4ML IJ SOSY
40.0000 mg | PREFILLED_SYRINGE | INTRAMUSCULAR | Status: DC
  Administered 2023-03-17 – 2023-03-19 (×3): 40 mg via SUBCUTANEOUS
  Filled 2023-03-17 (×3): qty 0.4

## 2023-03-17 MED ORDER — SENNOSIDES-DOCUSATE SODIUM 8.6-50 MG PO TABS
2.0000 | ORAL_TABLET | Freq: Two times a day (BID) | ORAL | Status: DC
  Administered 2023-03-17: 2 via ORAL
  Filled 2023-03-17 (×2): qty 2

## 2023-03-17 MED ORDER — CARBIDOPA-LEVODOPA ER 50-200 MG PO TBCR
1.0000 | EXTENDED_RELEASE_TABLET | Freq: Every day | ORAL | Status: DC
  Administered 2023-03-17: 1 via ORAL
  Filled 2023-03-17: qty 1

## 2023-03-17 MED ORDER — QUETIAPINE FUMARATE 25 MG PO TABS
12.5000 mg | ORAL_TABLET | Freq: Two times a day (BID) | ORAL | Status: DC
  Administered 2023-03-17 – 2023-03-18 (×2): 12.5 mg via ORAL
  Filled 2023-03-17 (×3): qty 1

## 2023-03-17 MED ORDER — SODIUM CHLORIDE 0.9 % IV SOLN
500.0000 mg | INTRAVENOUS | Status: DC
  Administered 2023-03-17: 500 mg via INTRAVENOUS
  Filled 2023-03-17: qty 5

## 2023-03-17 MED ORDER — SODIUM CHLORIDE 0.9 % IV SOLN
3.0000 g | Freq: Three times a day (TID) | INTRAVENOUS | Status: DC
  Administered 2023-03-17 – 2023-03-18 (×3): 3 g via INTRAVENOUS
  Filled 2023-03-17: qty 8
  Filled 2023-03-17: qty 3
  Filled 2023-03-17: qty 8

## 2023-03-17 MED ORDER — LACTULOSE 10 GM/15ML PO SOLN
20.0000 g | Freq: Once | ORAL | Status: AC
  Administered 2023-03-17: 20 g via ORAL
  Filled 2023-03-17: qty 30

## 2023-03-17 MED ORDER — LAMOTRIGINE 100 MG PO TABS
100.0000 mg | ORAL_TABLET | Freq: Two times a day (BID) | ORAL | Status: DC
  Administered 2023-03-17 – 2023-03-20 (×6): 100 mg via ORAL
  Filled 2023-03-17 (×2): qty 1
  Filled 2023-03-17: qty 4
  Filled 2023-03-17: qty 1
  Filled 2023-03-17 (×3): qty 4
  Filled 2023-03-17 (×3): qty 1

## 2023-03-17 MED ORDER — DULOXETINE HCL 60 MG PO CPEP
60.0000 mg | ORAL_CAPSULE | Freq: Every day | ORAL | Status: DC
  Administered 2023-03-18 – 2023-03-20 (×3): 60 mg via ORAL
  Filled 2023-03-17: qty 1
  Filled 2023-03-17 (×2): qty 2
  Filled 2023-03-17 (×2): qty 1
  Filled 2023-03-17: qty 2

## 2023-03-17 MED ORDER — ASPIRIN 81 MG PO TBEC
81.0000 mg | DELAYED_RELEASE_TABLET | Freq: Every day | ORAL | Status: DC
  Administered 2023-03-18 – 2023-03-20 (×3): 81 mg via ORAL
  Filled 2023-03-17 (×3): qty 1

## 2023-03-17 NOTE — ED Triage Notes (Signed)
Pt here from home with a cough. Pt has a hx of Lewy Body disease. Pt has been more lethargic and has a non productive cough. Pt had a fever last week. Pt also has a small scab on the back of her head, pt denies falling. Pt has some confusion.  Pt denies pain.   137/72 132-cbg 97.7-axillary

## 2023-03-17 NOTE — Telephone Encounter (Signed)
Patient's friend, Odella Aquas, is calling regarding patient.  [name] states patient is wheezing.  [name] states patient has been asleep 20+ hours, but this is not unusual for a patient with parkinson's disease.  [name] states patient was talking out of her head last night. [name] states patient is very confused.  [name] states patient had a fever and they have been controlling it with advil and tylenol.  I transferred call to Access Nurse.

## 2023-03-17 NOTE — ED Provider Notes (Signed)
[facility] Provider Note    Event Date/Time   First MD Initiated Contact with Patient 03/17/23 1230     (approximate)   History   Cough and Shortness of Breath   HPI  Deborah Velazquez is a 77 y.o. female with Lewy body dementia who comes in with concern for increasing lethargy and nonproductive cough as well as intermittent fever up to 100.  Patient receives 24/7 care at home.  She is here with her friend who is the POA.  They reported that she seems more weak 91 to get out of bed that she had some fevers last week.  No known falls but increasing confusion and reporting some headaches.  No known abdominal pain but even if she is in pain she will not complain of it.  To be in the 90s on room air and placed on 2 L in triage.  They do report a cough recently and thought it could be related to allergies.   Physical Exam   Triage Vital Signs: ED Triage Vitals  Enc Vitals Group     BP 03/17/23 1129 119/74     Pulse Rate 03/17/23 1129 71     Resp 03/17/23 1147 16     Temp 03/17/23 1129 97.6 F (36.4 C)     Temp Source 03/17/23 1129 Axillary     SpO2 03/17/23 1129 90 %     Weight 03/17/23 1127 164 lb 14.5 oz (74.8 kg)     Height 03/17/23 1127  (1.651 m)     Head Circumference --      Peak Flow --      Pain Score 03/17/23 1127 0     Pain Loc --      Pain Edu? --      Excl. in GC? --     Most recent vital signs: Vitals:   03/17/23 1129 03/17/23 1147  BP: 119/74   Pulse: 71   Resp:  16  Temp: 97.6 F (36.4 C)   SpO2: 90%      General: Awake, no distress.  CV:  Good peripheral perfusion.  Resp:  Normal effort.  Clear lungs Abd:  No distention.  Soft nontender Other:  Confusion noted   ED Results / Procedures / Treatments   Labs (all labs ordered are listed, but only abnormal results are displayed) Labs Reviewed  BASIC METABOLIC PANEL - Abnormal; Notable for the following components:      Result Value   Glucose, Bld 118 (*)     Calcium 8.8 (*)    All other components within normal limits  CBC  URINALYSIS, ROUTINE W REFLEX MICROSCOPIC  CBG MONITORING, ED     EKG  My interpretation of EKG:  Normal sinus rate 71 without any ST elevation or T wave inversions, normal intervals  RADIOLOGY Pending    PROCEDURES:  Critical Care performed: Yes, see critical care procedure note(s)  .1-3 Lead EKG Interpretation  Performed by: Concha Se, MD Authorized by: Concha Se, MD     Interpretation: abnormal     ECG rate:  70   ECG rate assessment: normal     Rhythm: sinus rhythm     Ectopy: none     Conduction: normal   .Critical Care  Performed by: Concha Se, MD Authorized by: Concha Se, MD   Critical care provider statement:    Critical care time (minutes):  30   Critical care was necessary to treat  or prevent imminent or life-threatening deterioration of the following conditions:  Respiratory failure   Critical care was time spent personally by me on the following activities:  Development of treatment plan with patient or surrogate, discussions with consultants, evaluation of patient's response to treatment, examination of patient, ordering and review of laboratory studies, ordering and review of radiographic studies, ordering and performing treatments and interventions, pulse oximetry, re-evaluation of patient's condition and review of old charts    MEDICATIONS ORDERED IN ED: Medications  iohexol (OMNIPAQUE) 350 MG/ML injection 100 mL (100 mLs Intravenous Contrast Given 03/17/23 1516)     IMPRESSION / MDM / ASSESSMENT AND PLAN / ED COURSE  I reviewed the triage vital signs and the nursing notes.   Patient's presentation is most consistent with acute presentation with potential threat to life or bodily function.   Patient comes in with concerns for shortness of breath poor historian.  Patient on 2 L due to desatting down to 86% that we did reconfirm here in the emergency room as well as  out front.  No wheezing to suggest COPD.  Given some confusion weakness patient will need admission but will get CT imaging to evaluate for intracranial hemorrhage, PE.  Will scan through the belly given limited historian unclear if any abdominal pain.  BMP reassuring.  CBC reassuring.  Troponin negative  Patient handed off to oncoming team pending CT scans and suspect admission for hypoxia  The patient is on the cardiac monitor to evaluate for evidence of arrhythmia and/or significant heart rate changes.      FINAL CLINICAL IMPRESSION(S) / ED DIAGNOSES   Final diagnoses:  Acute cough  Acute respiratory failure with hypoxia     Rx / DC Orders   ED Discharge Orders     None        Note:  This document was prepared using Dragon voice recognition software and may include unintentional dictation errors.   Concha Se, MD 03/17/23 1524

## 2023-03-17 NOTE — Sepsis Progress Note (Signed)
Elink will follow per sepsis protocol  

## 2023-03-17 NOTE — ED Notes (Signed)
O2 replaced and humidy placed.

## 2023-03-17 NOTE — Sepsis Progress Note (Signed)
Notified bedside nurse and provider via secure chat of need to order and draw lactic acid per sepsis protocol.

## 2023-03-17 NOTE — ED Notes (Signed)
Pts RA sat 87%. Pt placed on 2L [place].

## 2023-03-17 NOTE — ED Notes (Signed)
pA: hypoxia, CTs   Merwyn Katos, MD 03/17/23 1723

## 2023-03-17 NOTE — H&P (Signed)
History and Physical    Patient: Deborah Velazquez ZOX:[id number] DOB: [birth year] DOA: 03/17/2023 DOS: the patient was seen and examined on 03/17/2023 PCP: Sherlene Shams, MD  Patient coming from: Home  Chief Complaint:  Chief Complaint  Patient presents with   Cough   Shortness of Breath   HPI: Deborah Velazquez is a 77 y.o. female with medical history significant of Parkinson disease, Lewy body dementia, dyslipidemia, history of TIA who presents to the hospital with a low-grade fever, altered mental status, generalized weakness and a cough.  Chest x-ray showed a right lower lobe pneumonia suspect aspiration pneumonia.  She was found to have a hypoxemia with o2 saturation of 89%, was placed on 4 L oxygen.  Patient normally walk with a walker, with 24-hour care at home.  She started feel sick on Saturday, she became more sleepy, more confused.  She also developed significant weakness.  She also developed cough, nonproductive, short of breath with exertion.  She has been sleeping since Sunday, has not been eating.  She is severely constipated, last bowel movement was 6 days ago, but she did not have any nausea vomiting or abdominal pain.  Upon arriving the hospital, she was given IV antibiotics including Rocephin and Flagyl.  Review of Systems: As mentioned in the history of present illness. All other systems reviewed and are negative. Past Medical History:  Diagnosis Date   Anxiety    Arthritis    Hypercholesterolemia    Hyperlipemia    Irritable bowel syndrome    Parkinson disease (HCC)    Restless leg syndrome    RSD (reflex sympathetic dystrophy)    pt denies   Stroke [facility])    TIA's   Past Surgical History:  Procedure Laterality Date   APPENDECTOMY     BACK SURGERY     BREAST BIOPSY Left 08/16/2013   neg   CATARACT EXTRACTION Left    CATARACT EXTRACTION W/PHACO Right 06/01/2020   Procedure: CATARACT EXTRACTION PHACO AND INTRAOCULAR LENS PLACEMENT (IOC) RIGHT;  Surgeon:  Lockie Mola, MD;  Location: [facility] ORS;  Service: Ophthalmology;  Laterality: Right;  cde5.26 us01:05.7 ap8.0   COLONOSCOPY     COLONOSCOPY WITH PROPOFOL N/A 06/20/2015   Procedure: COLONOSCOPY WITH PROPOFOL;  Surgeon: Christena Deem, MD;  Location: [facility] ENDOSCOPY;  Service: Endoscopy;  Laterality: N/A;   EYE SURGERY     Cataract in one eye   FRACTURE SURGERY     wrist - right and right leg. (? if any metal)   PARS PLANA VITRECTOMY Left 2010   Social History:  reports that she has never smoked. She has never used smokeless tobacco. She reports current alcohol use. She reports that she does not use drugs.  Allergies  Allergen Reactions   Pseudoephedrine Hcl Anxiety and Other (See Comments)    Nervous   Sudafed [Pseudoephedrine] Anxiety and Other (See Comments)    Nervous    Family History  Problem Relation Age of Onset   Breast cancer Mother 43   Diabetes Mother    Cancer Mother    Heart disease Mother    Alzheimer's disease Father    Heart disease Maternal Aunt     Prior to Admission medications   Medication Sig Start Date End Date Taking? Authorizing Provider  aspirin EC 81 MG tablet Take 81 mg by mouth daily. Swallow whole.    [provider]  atorvastatin (LIPITOR) 10 MG tablet Take 1 tablet (10 mg total) by mouth at bedtime. 05/07/22  Sherlene Shams, MD  carbidopa-levodopa (SINEMET CR) 50-200 MG tablet Take 1 tablet by mouth at bedtime. Take one tablet at 2p & one at 5:30p 12/30/19   [provider]  carbidopa-levodopa (SINEMET IR) 25-100 MG tablet 2 in the morning, 1 in the afternoon, 1 in the evening Patient not taking: Reported on 07/16/2022 08/24/18   Tat, Octaviano Batty, DO  DULoxetine (CYMBALTA) 60 MG capsule Take 60 mg by mouth daily.     [provider]  estradiol (ESTRACE) 0.1 MG/GM vaginal cream INSERT 1 APPLICATORFUL VAGINALLY 2 TIMES A WEEK AS DIRECTED 10/14/22   Sherlene Shams, MD  hydrOXYzine (ATARAX/VISTARIL) 25 MG tablet Take  25 mg by mouth 2 (two) times daily as needed. 07/06/20   [provider]  lamoTRIgine (LAMICTAL) 100 MG tablet Take 100 mg by mouth 2 (two) times daily. 11/12/21   [provider]  nystatin (MYCOSTATIN/NYSTOP) powder Apply 1 application. topically 2 (two) times daily. To rash until resolved. 02/26/22   Sherlene Shams, MD  OVER THE COUNTER MEDICATION Take 1 capsule by mouth in the morning and at bedtime. Doterra Life Long Vitality Pack    [provider]  pregabalin (LYRICA) 150 MG capsule Take 150 mg by mouth 2 (two) times daily.  06/10/19   [provider]  QUEtiapine (SEROQUEL) 25 MG tablet Take 12.5 mg by mouth 2 (two) times daily. 05/16/22   [provider]  sertraline (ZOLOFT) 100 MG tablet Take 200 mg by mouth daily.     [provider]    Physical Exam: Vitals:   03/17/23 1129 03/17/23 1147 03/17/23 1500 03/17/23 1700  BP: 119/74  129/81 131/77  Pulse: 71  73 72  Resp:  Temp: 97.6 F (36.4 C)  98.3 F (36.8 C) 98.3 F (36.8 C)  TempSrc: Axillary  Oral Oral  SpO2: 90%  91% 94%  Weight:      Height:       Physical Exam Constitutional:      General: She is not in acute distress.    Appearance: She is not toxic-appearing.  HENT:     Head: Normocephalic and atraumatic.     Mouth/Throat:     Mouth: Mucous membranes are moist.     Pharynx: No pharyngeal swelling.  Eyes:     Pupils: Pupils are equal, round, and reactive to light.  Neck:     Thyroid: No thyromegaly.     Vascular: No hepatojugular reflux or JVD.  Cardiovascular:     Rate and Rhythm: Normal rate and regular rhythm. No extrasystoles are present.    Heart sounds: No murmur heard. Pulmonary:     Effort: Pulmonary effort is normal.     Breath sounds: Normal breath sounds.     Comments: Crackles in the right lower field. Abdominal:     General: Bowel sounds are normal.     Palpations: Abdomen is soft. There is no hepatomegaly or mass.     Tenderness:  There is no abdominal tenderness. There is no rebound.  Musculoskeletal:        General: Normal range of motion.     Cervical back: Normal range of motion and neck supple.     Right lower leg: No edema.     Left lower leg: No edema.  Skin:    General: Skin is warm and dry.     Coloration: Skin is not cyanotic.  Neurological:     General: No focal deficit present.  Mental Status: She is alert.     Comments: She is alert oriented to place and person, she has some hallucination.  Psychiatric:        Mood and Affect: Mood normal.        Behavior: Behavior normal.     Data Reviewed:  Reviewed chest x-ray and lab results.  Assessment and Plan: Acute hypoxemic respiratory failure Right lower lobe pneumonia most likely aspiration pneumonia. Patient symptoms started 2 days ago, she also had a significant altered mental status.  She did not have any dysphagia or choking episodes.  She most likely has aspiration pneumonia.  Alternatively, patient may develop pneumonia then aspirated due to altered mental status.  Will check procalcitonin level. Will also check a viral panel, COVID test negative.  Acute metabolic encephalopathy Parkinson disease  Lewy body dementia. Continue home medicines including Seroquel.  History of TIA. Continue statin and aspirin.   Advance Care Planning:   Code Status: Full Code discussed with patient POA, patient is full code.  Consults: None  Family Communication: POA updated.  Severity of Illness: The appropriate patient status for this patient is INPATIENT. Inpatient status is judged to be reasonable and necessary in order to provide the required intensity of service to ensure the patient's safety. The patient's presenting symptoms, physical exam findings, and initial radiographic and laboratory data in the context of their chronic comorbidities is felt to place them at high risk for further clinical deterioration. Furthermore, it is not anticipated that  the patient will be medically stable for discharge from the hospital within 2 midnights of admission.   * I certify that at the point of admission it is my clinical judgment that the patient will require inpatient hospital care spanning beyond 2 midnights from the point of admission due to high intensity of service, high risk for further deterioration and high frequency of surveillance required.*  Author: Marrion Coy, MD 03/17/2023 5:39 PM  For on call review [email or web address].

## 2023-03-17 NOTE — Code Documentation (Signed)
CODE SEPSIS - PHARMACY COMMUNICATION  **Broad Spectrum Antibiotics should be administered within 1 hour of Sepsis diagnosis**  Time Code Sepsis Called/Page Received: 1647  Antibiotics Ordered: ceftriaxone, azithromycin  Time of 1st antibiotic administration: 1736    [name] ,PharmD Clinical Pharmacist  03/17/2023  5:40 PM

## 2023-03-17 NOTE — Telephone Encounter (Signed)
Pt is currently in the ED.

## 2023-03-18 DIAGNOSIS — J690 Pneumonitis due to inhalation of food and vomit: Secondary | ICD-10-CM | POA: Diagnosis not present

## 2023-03-18 DIAGNOSIS — G9341 Metabolic encephalopathy: Secondary | ICD-10-CM | POA: Diagnosis not present

## 2023-03-18 DIAGNOSIS — G3183 Dementia with Lewy bodies: Secondary | ICD-10-CM | POA: Diagnosis not present

## 2023-03-18 DIAGNOSIS — J123 Human metapneumovirus pneumonia: Secondary | ICD-10-CM

## 2023-03-18 DIAGNOSIS — J9601 Acute respiratory failure with hypoxia: Secondary | ICD-10-CM | POA: Diagnosis not present

## 2023-03-18 HISTORY — DX: Human metapneumovirus pneumonia: J12.3

## 2023-03-18 LAB — LACTIC ACID, PLASMA: Lactic Acid, Venous: 1.9 mmol/L (ref 0.5–1.9)

## 2023-03-18 LAB — HIV ANTIBODY (ROUTINE TESTING W REFLEX): HIV Screen 4th Generation wRfx: NONREACTIVE

## 2023-03-18 LAB — CULTURE, BLOOD (ROUTINE X 2)

## 2023-03-18 MED ORDER — CARBIDOPA-LEVODOPA ER 50-200 MG PO TBCR
1.0000 | EXTENDED_RELEASE_TABLET | ORAL | Status: DC
  Administered 2023-03-18 – 2023-03-20 (×10): 1 via ORAL
  Filled 2023-03-18 (×11): qty 1

## 2023-03-18 MED ORDER — QUETIAPINE FUMARATE 25 MG PO TABS
25.0000 mg | ORAL_TABLET | Freq: Every day | ORAL | Status: DC
  Administered 2023-03-19: 25 mg via ORAL
  Filled 2023-03-18: qty 1

## 2023-03-18 MED ORDER — SODIUM CHLORIDE 0.9 % IV SOLN
3.0000 g | Freq: Four times a day (QID) | INTRAVENOUS | Status: DC
  Administered 2023-03-18 – 2023-03-20 (×8): 3 g via INTRAVENOUS
  Filled 2023-03-18 (×3): qty 8
  Filled 2023-03-18: qty 3
  Filled 2023-03-18 (×4): qty 8

## 2023-03-18 NOTE — Plan of Care (Signed)
  Problem: Education: Goal: Knowledge of General Education information will improve Description: Including pain rating scale, medication(s)/side effects and non-pharmacologic comfort measures Outcome: Progressing   Problem: Health Behavior/Discharge Planning: Goal: Ability to manage health-related needs will improve Outcome: Progressing   Problem: Clinical Measurements: Goal: Ability to maintain clinical measurements within normal limits will improve Outcome: Progressing   Problem: Activity: Goal: Risk for activity intolerance will decrease Outcome: Progressing   Problem: Nutrition: Goal: Adequate nutrition will be maintained Outcome: Progressing   Problem: Elimination: Goal: Will not experience complications related to bowel motility Outcome: Progressing   Problem: Pain Managment: Goal: General experience of comfort will improve Outcome: Progressing   

## 2023-03-18 NOTE — Evaluation (Signed)
Clinical/Bedside Swallow Evaluation Patient Details  Name: ZAHLIA DESHAZER MRN: [id number] Date of Birth: 09-Nov-1946  Today's Date: 03/18/2023 Time: SLP Start Time (ACUTE ONLY): 1030 SLP Stop Time (ACUTE ONLY): 1056 SLP Time Calculation (min) (ACUTE ONLY): 26 min  Past Medical History:  Past Medical History:  Diagnosis Date   Anxiety    Arthritis    Hypercholesterolemia    Hyperlipemia    Irritable bowel syndrome    Parkinson disease    Restless leg syndrome    RSD (reflex sympathetic dystrophy)    pt denies   Stroke    TIA's   Past Surgical History:  Past Surgical History:  Procedure Laterality Date   APPENDECTOMY     BACK SURGERY     BREAST BIOPSY Left 08/16/2013   neg   CATARACT EXTRACTION Left    CATARACT EXTRACTION W/PHACO Right 06/01/2020   Procedure: CATARACT EXTRACTION PHACO AND INTRAOCULAR LENS PLACEMENT (IOC) RIGHT;  Surgeon: Lockie Mola, MD;  Location: [facility] ORS;  Service: Ophthalmology;  Laterality: Right;  cde5.26 us01:05.7 ap8.0   COLONOSCOPY     COLONOSCOPY WITH PROPOFOL N/A 06/20/2015   Procedure: COLONOSCOPY WITH PROPOFOL;  Surgeon: Christena Deem, MD;  Location: [facility] ENDOSCOPY;  Service: Endoscopy;  Laterality: N/A;   EYE SURGERY     Cataract in one eye   FRACTURE SURGERY     wrist - right and right leg. (? if any metal)   PARS PLANA VITRECTOMY Left 2010   HPI:  MARIAH GERSTENBERGER is a 77 y.o. female with medical history significant of Parkinson disease, Lewy body dementia, dyslipidemia, history of TIA who presents to the hospital with a low-grade fever, altered mental status, generalized weakness and a cough.  Chest x-ray showed a right lower lobe pneumonia suspect aspiration pneumonia.  She was found to have a hypoxemia with o2 saturation of 89%, was placed on 4 L oxygen.    Assessment / Plan / Recommendation  Clinical Impression  Pt was alert and able to hold cup. Family member present throughout. She states that pt has 24/7 care with some  caregivers laying pt down after consuming meals. In addition, pt was observed leaning to her right in bed. Family states that pt does prefer to lean to right while sitting and lays on her right side while sleeping despite attempts to prop up upright. Family member states that pt doesn't cough/choke when consuming POs but does acknowledge increased risk of aspiration given current co-morbidities. Skilled observation was provided of pt consuming mixed consistencies (diced peaches), thin liquids via straw and medicines whole with thin liquids. Pt was free of any oral residue (visual inspection) or overt s/s of aspiration. At this time, current diet appear appropriate with continued increased risk of aspiration given pt's advanced Parkinson's disease. Family member is able to return demonstrate understanding of aspiration precautions. ST will sign off at this time. SLP Visit Diagnosis: Dysphagia, unspecified (R13.10)    Aspiration Risk  Moderate aspiration risk    Diet Recommendation Dysphagia 3 (Mech soft);Thin liquid   Liquid Administration via: Straw Medication Administration: Whole meds with liquid Supervision: Full supervision/cueing for compensatory strategies;Staff to assist with self feeding Compensations: Minimize environmental distractions;Slow rate;Small sips/bites Postural Changes: Remain upright for at least 30 minutes after po intake;Seated upright at 90 degrees    Other  Recommendations Oral Care Recommendations: Oral care BID    Recommendations for follow up therapy are one component of a multi-disciplinary discharge planning process, led by the attending physician.  Recommendations  may be updated based on patient status, additional functional criteria and insurance authorization.  Follow up Recommendations No SLP follow up        Swallow Study   General Date of Onset: 03/17/23 HPI: AYUSHI PLA is a 77 y.o. female with medical history significant of Parkinson disease, Lewy  body dementia, dyslipidemia, history of TIA who presents to the hospital with a low-grade fever, altered mental status, generalized weakness and a cough.  Chest x-ray showed a right lower lobe pneumonia suspect aspiration pneumonia.  She was found to have a hypoxemia with o2 saturation of 89%, was placed on 4 L oxygen. Type of Study: Bedside Swallow Evaluation Previous Swallow Assessment: none in chart Diet Prior to this Study: Dysphagia 3 (mechanical soft);Thin liquids (Level 0) Temperature Spikes Noted: No Respiratory Status: Nasal cannula (4liters) History of Recent Intubation: No Behavior/Cognition: Alert;Confused;Distractible;Requires cueing;Doesn't follow directions Oral Cavity Assessment: Within Functional Limits Oral Care Completed by SLP: No Oral Cavity - Dentition: Adequate natural dentition Vision: Functional for self-feeding Self-Feeding Abilities: Needs assist;Needs set up;Total assist Patient Positioning: Upright in bed Baseline Vocal Quality: Low vocal intensity Volitional Cough: Cognitively unable to elicit Volitional Swallow: Unable to elicit    Oral/Motor/Sensory Function Overall Oral Motor/Sensory Function: Within functional limits   Ice Chips Ice chips: Not tested   Thin Liquid Thin Liquid: Within functional limits Presentation: Self Fed;Straw    Nectar Thick Nectar Thick Liquid: Not tested   Honey Thick Honey Thick Liquid: Not tested   Puree Puree: Not tested   Solid     Solid: Within functional limits Presentation: Spoon Other Comments:  (mixed consistancy of diced peaches; medicine whole with thin liquids via straw)     Ellis Mehaffey B. Dreama Saa, M.S., CCC-SLP, Tree surgeon Certified Brain Injury Specialist [facility]  [facility] Rehabilitation Services Office [phone] Ascom [phone] Fax [phone]

## 2023-03-18 NOTE — Plan of Care (Signed)
  Problem: Clinical Measurements: Goal: Ability to maintain clinical measurements within normal limits will improve Outcome: Progressing Goal: Will remain free from infection Outcome: Progressing   Problem: Activity: Goal: Risk for activity intolerance will decrease Outcome: Progressing   Problem: Nutrition: Goal: Adequate nutrition will be maintained Outcome: Progressing   Problem: Elimination: Goal: Will not experience complications related to bowel motility Outcome: Progressing Goal: Will not experience complications related to urinary retention Outcome: Progressing   Problem: Safety: Goal: Ability to remain free from injury will improve Outcome: Progressing   Problem: Skin Integrity: Goal: Risk for impaired skin integrity will decrease Outcome: Progressing

## 2023-03-18 NOTE — Evaluation (Signed)
Physical Therapy Evaluation Patient Details Name: Deborah Velazquez MRN: [id number] DOB: 12-17-1945 Today's Date: 03/18/2023  History of Present Illness  Pt is a 77 y.o. female with medical history significant of Parkinson's disease, Lewy body dementia, dyslipidemia, history of TIA who presents to the hospital with a low-grade fever, altered mental status, generalized weakness and a cough. MD assessment includes: acute hypoxemic respiratory failure, right lower lobe aspiration pneumonia, human metapneumovirus pneumonia, and acute metabolic encephalopathy.   Clinical Impression  Pt required near total assist with bed mobility tasks but once in sitting was able to maintain static sitting balance at the EOB with close SBA.  Pt declined to attempt to stand secondary to fatigue stating that her legs were too tired to try to stand.  Encouragement given with pt continuing to decline.  Pt's SpO2 remained in the mid 90s on 4LO2/min with HR WNL during the session.  Pt will benefit from continued PT services upon discharge to safely address deficits listed in patient problem list for decreased caregiver assistance and eventual return to PLOF.        Recommendations for follow up therapy are one component of a multi-disciplinary discharge planning process, led by the attending physician.  Recommendations may be updated based on patient status, additional functional criteria and insurance authorization.  Follow Up Recommendations Can patient physically be transported by private vehicle: No     Assistance Recommended at Discharge Frequent or constant Supervision/Assistance  Patient can return home with the following  A lot of help with walking and/or transfers;A lot of help with bathing/dressing/bathroom;Assistance with cooking/housework;Direct supervision/assist for medications management;Assist for transportation;Help with stairs or ramp for entrance    Equipment Recommendations None recommended by PT   Recommendations for Other Services       Functional Status Assessment Patient has had a recent decline in their functional status and demonstrates the ability to make significant improvements in function in a reasonable and predictable amount of time.     Precautions / Restrictions Precautions Precautions: Fall Restrictions Weight Bearing Restrictions: No      Mobility  Bed Mobility Overal bed mobility: Needs Assistance Bed Mobility: Supine to Sit, Sit to Supine     Supine to sit: Max assist, +2 for physical assistance Sit to supine: Max assist, +2 for physical assistance   General bed mobility comments: +2 max A for BLE and trunk control    Transfers                   General transfer comment: Pt declined to attempt secondary to fatigue    Ambulation/Gait                  Stairs            Wheelchair Mobility    Modified Rankin (Stroke Patients Only)       Balance Overall balance assessment: Needs assistance   Sitting balance-Leahy Scale: Fair         Standing balance comment: pt declined to stand                             Pertinent Vitals/Pain Pain Assessment Pain Assessment: No/denies pain    Home Living Family/patient expects to be discharged to:: Private residence Living Arrangements: Spouse/significant other Available Help at Discharge: Family;Personal care attendant;Available 24 hours/day Type of Home: House Home Access: Ramped entrance       Home Layout: One level Home Equipment: Rolling  Walker (2 wheels);Rollator (4 wheels);BSC/3in1;Wheelchair - manual;Shower seat;Hospital bed Additional Comments: lift chair, PCA 24/7    Prior Function Prior Level of Function : Needs assist       Physical Assist : Mobility (physical);ADLs (physical)     Mobility Comments: SBA with amb with a RW household distances, no fall history, ACTA and w/c for community access ADLs Comments: PCA assists with ADLs      Hand Dominance        Extremity/Trunk Assessment   Upper Extremity Assessment Upper Extremity Assessment: Generalized weakness    Lower Extremity Assessment Lower Extremity Assessment: Generalized weakness       Communication      Cognition Arousal/Alertness: Lethargic Behavior During Therapy: Flat affect Overall Cognitive Status: Impaired/Different from baseline Area of Impairment: Attention                                        General Comments      Exercises     Assessment/Plan    PT Assessment Patient needs continued PT services  PT Problem List Decreased strength;Decreased activity tolerance;Decreased balance;Decreased mobility       PT Treatment Interventions DME instruction;Gait training;Functional mobility training;Therapeutic activities;Therapeutic exercise;Balance training;Patient/family education    PT Goals (Current goals can be found in the Care Plan section)  Acute Rehab PT Goals Patient Stated Goal: To get strong enough to be able to safely get back home PT Goal Formulation: With family Time For Goal Achievement: 03/31/23 Potential to Achieve Goals: Good    Frequency Min 3X/week     Co-evaluation               AM-PAC PT "6 Clicks" Mobility  Outcome Measure Help needed turning from your back to your side while in a flat bed without using bedrails?: A Lot Help needed moving from lying on your back to sitting on the side of a flat bed without using bedrails?: A Lot Help needed moving to and from a bed to a chair (including a wheelchair)?: A Lot Help needed standing up from a chair using your arms (e.g., wheelchair or bedside chair)?: A Lot Help needed to walk in hospital room?: A Lot Help needed climbing 3-5 steps with a railing? : Total 6 Click Score: 11    End of Session   Activity Tolerance: Patient limited by fatigue Patient left: in bed;with call [place]/phone within reach;with bed alarm set;with family/visitor  present Nurse Communication: Mobility status PT Visit Diagnosis: Difficulty in walking, not elsewhere classified (R26.2);Muscle weakness (generalized) (M62.81)    Time: 1610-9604 PT Time Calculation (min) (ACUTE ONLY): 34 min   Charges:   PT Evaluation $PT Eval Moderate Complexity: 1 Mod         D. Scott Emryn Flanery PT, DPT 03/18/23, 2:58 PM

## 2023-03-18 NOTE — Progress Notes (Signed)
  Progress Note   Patient: Deborah Velazquez:[id number] DOB: 1946-05-14 DOA: 03/17/2023     1 DOS: the patient was seen and examined on 03/18/2023   Brief hospital course: Deborah Velazquez is a 77 y.o. female with medical history significant of Parkinson disease, Lewy body dementia, dyslipidemia, history of TIA who presents to the hospital with a low-grade fever, altered mental status, generalized weakness and a cough.  Chest x-ray showed a right lower lobe pneumonia suspect aspiration pneumonia.  She was found to have a hypoxemia with o2 saturation of 89%, was placed on 4 L oxygen.  She is treated with Unasyn. Respiratory panel also positive for human metapneumovirus.   Principal Problem:   Aspiration pneumonia Active Problems:   Parkinson's disease   Lewy body dementia   Acute hypoxemic respiratory failure   Aspiration pneumonia of right lower lobe   Acute metabolic encephalopathy   Overweight (BMI 25.0-29.9)   Pneumonia due to human metapneumovirus   Assessment and Plan:  Acute hypoxemic respiratory failure Right lower lobe aspiration pneumonia. Human metapneumovirus pneumonia. Sepsis ruled out. Patient symptoms started 2 days ago, she also had a significant altered mental status.  She did not have any dysphagia or choking episodes.  CT scan showed right lower lobe opacities, most likely aspiration pneumonia. Respiratory panel also positive for metapneumovirus. Patient probably had altered mental status secondary to a viral pneumonia, which caused subsequent aspiration. For now, patient is due require high flow oxygen, we will continue Unasyn for another day, may consider transition to oral Augmentin tomorrow. Wean oxygen.   Acute metabolic encephalopathy Parkinson disease  Lewy body dementia with behavioral disturbance. Patient did not sleep well last night, with some hallucination and agitation.  Will change Seroquel to 25 mg every evening.    History of TIA. Continue  statin and aspirin.     Subjective:  Patient did not sleep well last night, still has some confusion today.  Still require high flow oxygen, denies any short of breath today. Patient had no bowel movement for 6 days, had a very large bowel movement after giving lactulose.  Physical Exam: Vitals:   03/17/23 1700 03/17/23 1813 03/17/23 2016 03/18/23 1025  BP: 131/77 133/88 (!) 141/97 116/69  Pulse: 72 74 79 81  Resp: Temp: 98.3 F (36.8 C) 98.1 F (36.7 C) 98.4 F (36.9 C) 98 F (36.7 C)  TempSrc: Oral Oral    SpO2: 94% 95% 97% 93%  Weight:      Height:       General exam: Appears calm and comfortable  Respiratory system: Clear to auscultation. Respiratory effort normal. Cardiovascular system: S1 & S2 heard, RRR. No JVD, murmurs, rubs, gallops or clicks. No pedal edema. Gastrointestinal system: Abdomen is nondistended, soft and nontender. No organomegaly or masses felt. Normal bowel sounds heard. Central nervous system: Alert and oriented x1. No focal neurological deficits. Extremities: Symmetric 5 x 5 power. Skin: No rashes, lesions or ulcers Psychiatry: Flat affect.    Data Reviewed:  Lab results reviewed.  Family Communication: Caretaker (POA) updated at the bedside.  Disposition: Status is: Inpatient Remains inpatient appropriate because: Severity of disease, IV treatment. Up with him PT to evaluate, POA want to take patient home with home care.      Time spent: 35 minutes  Author: Marrion Coy, MD 03/18/2023 11:34 AM  For on call review [email or web address].

## 2023-03-18 NOTE — Hospital Course (Addendum)
Deborah Velazquez is a 77 y.o. female with medical history significant of Parkinson disease, Lewy body dementia, dyslipidemia, history of TIA who presents to the hospital with a low-grade fever, altered mental status, generalized weakness and a cough.  Chest x-ray showed a right lower lobe pneumonia suspect aspiration pneumonia.  She was found to have a hypoxemia with o2 saturation of 89%, was placed on 4 L oxygen.  She is treated with Unasyn. Respiratory panel also positive for human metapneumovirus.  4/24.  Patient able to come off oxygen. 4/25.  Family would like to take patient home with 24/7 care.

## 2023-03-18 NOTE — Progress Notes (Signed)
PHARMACY NOTE:  ANTIMICROBIAL RENAL DOSAGE ADJUSTMENT  Current antimicrobial regimen includes a mismatch between antimicrobial dosage and estimated renal function.  As per policy approved by the Pharmacy & Therapeutics and Medical Executive Committees, the antimicrobial dosage will be adjusted accordingly.  Current antimicrobial dosage:  Unasyn 3 grams IV every 8 hours  Indication: Aspiration pneumonia  Renal Function:  Estimated Creatinine Clearance: 58.6 mL/min (by C-G formula based on SCr of 0.65 mg/dL).      On intermittent HD, scheduled:      On CRRT    Antimicrobial dosage has been changed to:  Unasyn 3 grams IV every 6 hours.  Additional comments:   Thank you for allowing pharmacy to be a part of this patient's care.  Barrie Folk, [facility] 03/18/2023 11:07 AM

## 2023-03-18 NOTE — TOC Initial Note (Signed)
Transition of Care [facility]) - Initial/Assessment Note    Patient Details  Name: Deborah Velazquez MRN: [id number] Date of Birth: 05/18/1946  Transition of Care [facility]) CM/SW Contact:    Marlowe Sax, RN Phone Number: 03/18/2023, 2:58 PM  Clinical Narrative:                 Patient did not sleep well last night, still has some confusion today. Still require high flow oxygen  TOC to continue to follow and will assist with DC  Expected Discharge Plan: Home/Self Care Barriers to Discharge: Continued Medical Work up   Patient Goals and CMS Choice            Expected Discharge Plan and Services       Living arrangements for the past 2 months: Single Family Home                 DME Arranged: N/A         HH Arranged: NA HH Agency: NA        Prior Living Arrangements/Services Living arrangements for the past 2 months: Single Family Home Lives with:: Spouse Patient language and need for interpreter reviewed:: Yes Do you feel safe going back to the place where you live?: Yes      Need for Family Participation in Patient Care: Yes (Comment) Care giver support system in place?: Yes (comment)   Criminal Activity/Legal Involvement Pertinent to Current Situation/Hospitalization: No - Comment as needed  Activities of Daily Living Home Assistive Devices/Equipment: Hospital bed, Walker (specify type) ADL Screening (condition at time of admission) Patient's cognitive ability adequate to safely complete daily activities?: No Is the patient deaf or have difficulty hearing?: No Does the patient have difficulty seeing, even when wearing glasses/contacts?: No Does the patient have difficulty concentrating, remembering, or making decisions?: Yes Patient able to express need for assistance with ADLs?: No Does the patient have difficulty dressing or bathing?: Yes Independently performs ADLs?: No Communication: Independent Dressing (OT): Dependent Is this a change from baseline?:  Pre-admission baseline Grooming: Dependent Is this a change from baseline?: Pre-admission baseline Feeding: Independent Bathing: Dependent Is this a change from baseline?: Pre-admission baseline Toileting: Dependent Is this a change from baseline?: Pre-admission baseline In/Out Bed: Dependent Is this a change from baseline?: Pre-admission baseline Walks in Home: Dependent Is this a change from baseline?: Pre-admission baseline Does the patient have difficulty walking or climbing stairs?: Yes Weakness of Legs: Both Weakness of Arms/Hands: Both  Permission Sought/Granted                  Emotional Assessment Appearance:: Appears stated age Attitude/Demeanor/Rapport: Engaged Affect (typically observed): Pleasant Orientation: : Oriented to Self Alcohol / Substance Use: Not Applicable Psych Involvement: No (comment)  Admission diagnosis:  Aspiration pneumonia [J69.0] Acute respiratory failure with hypoxia [J96.01] Acute cough [R05.1] Patient Active Problem List   Diagnosis Date Noted   Pneumonia due to human metapneumovirus 03/18/2023   Lewy body dementia 03/17/2023   Acute hypoxemic respiratory failure 03/17/2023   Aspiration pneumonia of right lower lobe 03/17/2023   Aspiration pneumonia 03/17/2023   Acute metabolic encephalopathy 03/17/2023   Overweight (BMI 25.0-29.9) 03/17/2023   Candidiasis of skin 01/06/2021   Lethargy 10/25/2020   Encounter for preventive health examination 07/01/2020   Proximal leg weakness 07/01/2020   Allergic rhinitis due to pollen 03/06/2019   Atrophic vaginitis 11/25/2018   Dysuria 11/25/2018   Spondylolisthesis of lumbosacral region 10/07/2018   Neuropathy 07/13/2018   Cognitive impairment  09/22/2017   Parkinson's disease 08/19/2017   Prediabetes 07/26/2016   Pure hypercholesterolemia 12/23/2014   Anxiety 06/24/2014   PCP:  Sherlene Shams, MD Pharmacy:   [id number] - [place], [place] - [address] AT [facility] [address] [place] [address] Phone: [phone] Fax: [phone]     Social Determinants of Health (SDOH) Social History: SDOH Screenings   Food Insecurity: No Food Insecurity (03/17/2023)  Housing: Low Risk  (03/17/2023)  Transportation Needs: No Transportation Needs (03/17/2023)  Utilities: Not At Risk (03/17/2023)  Depression (PHQ2-9): Low Risk  (01/04/2021)  Financial Resource Strain: Low Risk  (06/22/2020)  Social Connections: Unknown (06/22/2020)  Stress: No Stress Concern Present (06/16/2019)  Tobacco Use: Low Risk  (03/17/2023)   SDOH Interventions:     Readmission Risk Interventions     No data to display

## 2023-03-19 DIAGNOSIS — J123 Human metapneumovirus pneumonia: Secondary | ICD-10-CM

## 2023-03-19 DIAGNOSIS — Z8673 Personal history of transient ischemic attack (TIA), and cerebral infarction without residual deficits: Secondary | ICD-10-CM

## 2023-03-19 DIAGNOSIS — F02A18 Dementia in other diseases classified elsewhere, mild, with other behavioral disturbance: Secondary | ICD-10-CM

## 2023-03-19 DIAGNOSIS — G9341 Metabolic encephalopathy: Secondary | ICD-10-CM | POA: Diagnosis not present

## 2023-03-19 DIAGNOSIS — J9601 Acute respiratory failure with hypoxia: Secondary | ICD-10-CM | POA: Diagnosis not present

## 2023-03-19 DIAGNOSIS — E663 Overweight: Secondary | ICD-10-CM

## 2023-03-19 LAB — CULTURE, BLOOD (ROUTINE X 2): Culture: NO GROWTH

## 2023-03-19 MED ORDER — NYSTATIN 100000 UNIT/GM EX POWD
Freq: Three times a day (TID) | CUTANEOUS | Status: DC
  Filled 2023-03-19: qty 15

## 2023-03-19 NOTE — Assessment & Plan Note (Signed)
ER physician documented pulse ox of 86%.  Patient was on 4 L of oxygen this morning.  Able to come off oxygen today.

## 2023-03-19 NOTE — Assessment & Plan Note (Signed)
On aspirin and statin °

## 2023-03-19 NOTE — Progress Notes (Signed)
Physical Therapy Treatment Patient Details Name: Deborah Velazquez MRN: [id number] DOB: May 12, 1946 Today's Date: 03/19/2023   History of Present Illness Pt is a 77 y.o. female with medical history significant of Parkinson's disease, Lewy body dementia, dyslipidemia, history of TIA who presents to the hospital with a low-grade fever, altered mental status, generalized weakness and a cough. MD assessment includes: acute hypoxemic respiratory failure, right lower lobe aspiration pneumonia, human metapneumovirus pneumonia, and acute metabolic encephalopathy.    PT Comments    Pt was long sitting in bed with supportive friend/caregiver at bedside. Pt was on 4 L o2 but was able to wean to rm air with sao2 > 91%. RN staff made aware that pt was removed form O2. Caregivers in room stated they will closely monitor O2 throughout the day. Pt was agreeable to session. Requires increased time to perform all task. Struggles to exit bed however once able to achieve EOB sitting, pt was able to stand and march in place with CGA. Overall she tolerated session well but was severely limited by fatigue from lack of sleep previous night. Author reach out to TOC/MD post session to discuss DC planning. Acute PT will continue to follow and progress as able per current POC.   Recommendations for follow up therapy are one component of a multi-disciplinary discharge planning process, led by the attending physician.  Recommendations may be updated based on patient status, additional functional criteria and insurance authorization.  Follow Up Recommendations  Can patient physically be transported by private vehicle: No    Assistance Recommended at Discharge Frequent or constant Supervision/Assistance (24/7)  Patient can return home with the following A lot of help with bathing/dressing/bathroom;Assistance with cooking/housework;Direct supervision/assist for medications management;Assist for transportation;Help with stairs or  ramp for entrance;A little help with walking and/or transfers   Equipment Recommendations  Other (comment) Michiel Sites lift if pt planning to DC directly home from acute hospital.)       Precautions / Restrictions Precautions Precautions: Fall Restrictions Weight Bearing Restrictions: No     Mobility  Bed Mobility Overal bed mobility: Needs Assistance Bed Mobility: Supine to Sit, Sit to Supine  Supine to sit: Max assist Sit to supine: Max assist General bed mobility comments: Increased time to perform. freezing noted but per caregivers, not unusual. Not at her baeline functionally however did better than previous date.  Transfers Overall transfer level: Needs assistance Equipment used: Rolling walker (2 wheels) Transfers: Sit to/from Stand Sit to Stand: Min guard  General transfer comment: CGA for safety. pt able to stand without physical lifting assistance    Ambulation/Gait  General Gait Details: pt unwilling to ambulate away from EOB however was able to march in place ~ 5 x each alternating. requested to sit 2/2 to fatigue and wanting to return to bed for sleep.    Balance Overall balance assessment: Needs assistance Sitting-balance support: Feet supported Sitting balance-Leahy Scale: Fair Sitting balance - Comments: Alternating between poor and good. Per caregiver, contributes balance deficits due to lack of sleep previous night.   Standing balance support: Bilateral upper extremity supported, During functional activity, Reliant on assistive device for balance Standing balance-Leahy Scale: Fair Standing balance comment: pt stood EOB and marched in place (alternating) with CGA only for safety.         Cognition Arousal/Alertness: Awake/alert Behavior During Therapy: Flat affect Overall Cognitive Status: Within Functional Limits for tasks assessed Area of Impairment: Orientation, Attention, Memory, Following commands, Safety/judgement, Awareness, Problem solving  General Comments: Pt is A and able to follow simple commands with increased time. Supportive caregivers x 2 present and very helpful with establishing baseline function           General Comments General comments (skin integrity, edema, etc.): performed several balance exercises EOB prior to pt requesting to return to supine to catch up on sleep.      Pertinent Vitals/Pain Pain Assessment Pain Assessment: No/denies pain     PT Goals (current goals can now be found in the care plan section) Acute Rehab PT Goals Patient Stated Goal: go home Progress towards PT goals: Progressing toward goals    Frequency    Min 3X/week      PT Plan Current plan remains appropriate       AM-PAC PT "6 Clicks" Mobility   Outcome Measure  Help needed turning from your back to your side while in a flat bed without using bedrails?: A Lot Help needed moving from lying on your back to sitting on the side of a flat bed without using bedrails?: A Lot Help needed moving to and from a bed to a chair (including a wheelchair)?: A Little Help needed standing up from a chair using your arms (e.g., wheelchair or bedside chair)?: A Little Help needed to walk in hospital room?: A Lot Help needed climbing 3-5 steps with a railing? : A Lot 6 Click Score: 14    End of Session Equipment Utilized During Treatment: Oxygen (pt on 4 L o2 prior to session however O2 removed with sao2 > 91 %) Activity Tolerance: Patient limited by fatigue;Patient tolerated treatment well Patient left: in bed;with call [place]/phone within reach;with bed alarm set;with family/visitor present Nurse Communication: Mobility status PT Visit Diagnosis: Difficulty in walking, not elsewhere classified (R26.2);Muscle weakness (generalized) (M62.81)     Time: 1478-2956 PT Time Calculation (min) (ACUTE ONLY): 44 min  Charges:  $Therapeutic Activity: 23-37 mins $Neuromuscular Re-education: 8-22 mins                     Jetta Lout  PTA 03/19/23, 10:29 AM

## 2023-03-19 NOTE — Progress Notes (Signed)
  Progress Note   Patient: Deborah Velazquez:[id number] DOB: 05-26-1946 DOA: 03/17/2023     2 DOS: the patient was seen and examined on 03/19/2023   Brief hospital course: Deborah Velazquez is a 77 y.o. female with medical history significant of Parkinson disease, Lewy body dementia, dyslipidemia, history of TIA who presents to the hospital with a low-grade fever, altered mental status, generalized weakness and a cough.  Chest x-ray showed a right lower lobe pneumonia suspect aspiration pneumonia.  She was found to have a hypoxemia with o2 saturation of 89%, was placed on 4 L oxygen.  She is treated with Unasyn. Respiratory panel also positive for human metapneumovirus.  Assessment and Plan: * Acute hypoxemic respiratory failure ER physician documented pulse ox of 86%.  Patient was on 4 L of oxygen this morning.  Able to come off oxygen today.  Pneumonia due to human metapneumovirus Supportive care.  Aspiration pneumonia of right lower lobe On Unasyn.  Will switch over to Augmentin for discharge.  History of TIA (transient ischemic attack) On aspirin and statin.  Overweight (BMI 25.0-29.9) Last BMI 27.44  Acute metabolic encephalopathy Likely secondary to infection.  Lewy body dementia Continue to monitor mental status here in the hospital.  Parkinson's disease Continue Sinemet 5 times a day        Subjective: Patient awakened from sleep.  Answered questions.  Family states that she has been coughing a lot and patient said only a little bit.  Admitted with acute respiratory failure and metapneumovirus infection and likely aspiration.  Physical Exam: Vitals:   03/18/23 1025 03/18/23 1444 03/19/23 0002 03/19/23 0841  BP: 116/69 (!) 144/80 (!) 152/86 (!) 148/88  Pulse: 81 83 62 60  Resp: Temp: 98 F (36.7 C) 98.5 F (36.9 C) (!) 97 F (36.1 C) 98.6 F (37 C)  TempSrc:      SpO2: 93% 94% 97% 94%  Weight:      Height:       Physical Exam HENT:     Head:  Normocephalic.     Mouth/Throat:     Pharynx: No oropharyngeal exudate.  Eyes:     General: Lids are normal.     Conjunctiva/sclera: Conjunctivae normal.  Cardiovascular:     Rate and Rhythm: Normal rate and regular rhythm.     Heart sounds: Normal heart sounds, S1 normal and S2 normal.  Pulmonary:     Breath sounds: Examination of the right-lower field reveals decreased breath sounds. Examination of the left-lower field reveals decreased breath sounds. Decreased breath sounds present. No wheezing, rhonchi or rales.  Abdominal:     Palpations: Abdomen is soft.     Tenderness: There is no abdominal tenderness.  Musculoskeletal:     Right lower leg: No swelling.     Left lower leg: No swelling.  Neurological:     Mental Status: She is alert.     Comments: Answers questions and able to straight leg raise.     Data Reviewed: Procalcitonin negative BNP 27.8, creatinine 0.65, white blood cell count 4.8  Family Communication: Spoke with family/caregivers at the bedside  Disposition: Status is: Inpatient Remains inpatient appropriate because: Continue IV antibiotics and supportive care today.  Potential disposition tomorrow.  Planned Discharge Destination: Home with Home Health    Time spent: 28 minutes  Author: Alford Highland, MD 03/19/2023 12:26 PM  For on call review [email or web address].

## 2023-03-19 NOTE — TOC Initial Note (Signed)
Transition of Care [facility]) - Initial/Assessment Note    Patient Details  Name: Deborah Velazquez MRN: [id number] Date of Birth: November 06, 1946  Transition of Care [facility]) CM/SW Contact:    Marlowe Sax, RN Phone Number: 03/19/2023, 12:25 PM  Clinical Narrative:                 The patient lives at home with a 24/7 caregiver, uses [employer] for transport to [place] need EMS transport home, has a hospital bed at [employer] and a wheelchair [employer] to deliver a [employer] lift to her home I reached out to [facility] and provided the [facility] referral she was open with them in the past Anticipate DC home tomorrow   Expected Discharge Plan: Home w Home Health Services Barriers to Discharge: Continued Medical Work up   Patient Goals and CMS Choice            Expected Discharge Plan and Services   Discharge Planning Services: CM Consult   Living arrangements for the past 2 months: Single Family Home                 DME Arranged: Other see comment [employer] Lift) DME Agency: [employer] Date DME Agency Contacted: 03/19/23 Time DME Agency Contacted: 1224 Representative spoke with at DME Agency: Barbara Cower HH Arranged: PT, OT HH Agency: [facility] Date [facility] Agency Contacted: 03/19/23 Time HH Agency Contacted: 1224 Representative spoke with at [facility] Agency: Kandee Keen  Prior Living Arrangements/Services Living arrangements for the past 2 months: Single Family Home Lives with:: Self (24/7 cregiver) Patient language and need for interpreter reviewed:: Yes Do you feel safe going back to the place where you live?: Yes      Need for Family Participation in Patient Care: Yes (Comment) Care giver support system in place?: Yes (comment) Current home services: DME [facility] Bed, Wheelchair) Criminal Activity/Legal Involvement Pertinent to Current Situation/Hospitalization: No - Comment as needed  Activities of Daily Living Home Assistive Devices/Equipment: Hospital bed, Walker (specify type) ADL Screening  (condition at time of admission) Patient's cognitive ability adequate to safely complete daily activities?: No Is the patient deaf or have difficulty hearing?: No Does the patient have difficulty seeing, even when wearing glasses/contacts?: No Does the patient have difficulty concentrating, remembering, or making decisions?: Yes Patient able to express need for assistance with ADLs?: No Does the patient have difficulty dressing or bathing?: Yes Independently performs ADLs?: No Communication: Independent Dressing (OT): Dependent Is this a change from baseline?: Pre-admission baseline Grooming: Dependent Is this a change from baseline?: Pre-admission baseline Feeding: Independent Bathing: Dependent Is this a change from baseline?: Pre-admission baseline Toileting: Dependent Is this a change from baseline?: Pre-admission baseline In/Out Bed: Dependent Is this a change from baseline?: Pre-admission baseline Walks in Home: Dependent Is this a change from baseline?: Pre-admission baseline Does the patient have difficulty walking or climbing stairs?: Yes Weakness of Legs: Both Weakness of Arms/Hands: Both  Permission Sought/Granted   Permission granted to share information with : Yes, Verbal Permission Granted              Emotional Assessment Appearance:: Appears stated age Attitude/Demeanor/Rapport: Engaged Affect (typically observed): Pleasant Orientation: : Oriented to Self Alcohol / Substance Use: Not Applicable Psych Involvement: No (comment)  Admission diagnosis:  Aspiration pneumonia [J69.0] Acute respiratory failure with hypoxia [J96.01] Acute cough [R05.1] Patient Active Problem List   Diagnosis Date Noted   Pneumonia due to human metapneumovirus 03/18/2023   Lewy body dementia 03/17/2023  Acute hypoxemic respiratory failure 03/17/2023   Aspiration pneumonia of right lower lobe 03/17/2023   Aspiration pneumonia 03/17/2023   Acute metabolic encephalopathy  03/17/2023   Overweight (BMI 25.0-29.9) 03/17/2023   Candidiasis of skin 01/06/2021   Lethargy 10/25/2020   Encounter for preventive health examination 07/01/2020   Proximal leg weakness 07/01/2020   Allergic rhinitis due to pollen 03/06/2019   Atrophic vaginitis 11/25/2018   Dysuria 11/25/2018   Spondylolisthesis of lumbosacral region 10/07/2018   Neuropathy 07/13/2018   Cognitive impairment 09/22/2017   Parkinson's disease 08/19/2017   Prediabetes 07/26/2016   Pure hypercholesterolemia 12/23/2014   Anxiety 06/24/2014   PCP:  Sherlene Shams, MD Pharmacy:   [id number] - [place], [place] - [address] AT [facility] [address] [facility] [address] [place] [address] Phone: [phone] Fax: [phone]     Social Determinants of Health (SDOH) Social History: SDOH Screenings   Food Insecurity: No Food Insecurity (03/17/2023)  Housing: Low Risk  (03/17/2023)  Transportation Needs: No Transportation Needs (03/17/2023)  Utilities: Not At Risk (03/17/2023)  Depression (PHQ2-9): Low Risk  (01/04/2021)  Financial Resource Strain: Low Risk  (06/22/2020)  Social Connections: Unknown (06/22/2020)  Stress: No Stress Concern Present (06/16/2019)  Tobacco Use: Low Risk  (03/17/2023)   SDOH Interventions:     Readmission Risk Interventions     No data to display

## 2023-03-19 NOTE — Assessment & Plan Note (Signed)
Last BMI 27.44

## 2023-03-19 NOTE — Assessment & Plan Note (Signed)
Supportive care. 

## 2023-03-19 NOTE — Assessment & Plan Note (Signed)
Continue Sinemet 5 times a day

## 2023-03-19 NOTE — Assessment & Plan Note (Signed)
Patient's mental status waxed and waned here in the hospital but was able to answer questions for me on both days that I saw her.

## 2023-03-19 NOTE — Assessment & Plan Note (Signed)
Received Unasyn while here.  Not sure if this is aspiration pneumonia that needs to be treated or not but will complete a short course with Augmentin upon going home.  Procalcitonin was negative.  Likely more metapneumovirus as a cause.

## 2023-03-19 NOTE — Assessment & Plan Note (Signed)
Likely secondary to infection.

## 2023-03-20 DIAGNOSIS — G9341 Metabolic encephalopathy: Secondary | ICD-10-CM | POA: Diagnosis not present

## 2023-03-20 DIAGNOSIS — E663 Overweight: Secondary | ICD-10-CM | POA: Diagnosis not present

## 2023-03-20 DIAGNOSIS — E876 Hypokalemia: Secondary | ICD-10-CM | POA: Insufficient documentation

## 2023-03-20 DIAGNOSIS — J9601 Acute respiratory failure with hypoxia: Secondary | ICD-10-CM | POA: Diagnosis not present

## 2023-03-20 DIAGNOSIS — J123 Human metapneumovirus pneumonia: Secondary | ICD-10-CM | POA: Diagnosis not present

## 2023-03-20 LAB — BASIC METABOLIC PANEL
Anion gap: 10 (ref 5–15)
BUN: 6 mg/dL — ABNORMAL LOW (ref 8–23)
CO2: 24 mmol/L (ref 22–32)
Calcium: 9.2 mg/dL (ref 8.9–10.3)
Chloride: 108 mmol/L (ref 98–111)
Creatinine, Ser: 0.66 mg/dL (ref 0.44–1.00)
GFR, Estimated: >60 mL/min (ref >60)
Glucose, Bld: 113 mg/dL — ABNORMAL HIGH (ref 70–99)
Potassium: 3 mmol/L — ABNORMAL LOW (ref 3.5–5.1)
Sodium: 142 mmol/L (ref 135–145)

## 2023-03-20 LAB — CBC
HCT: 39.8 % (ref 36.0–46.0)
Hemoglobin: 13.4 g/dL (ref 12.0–15.0)
MCH: 29.8 pg (ref 26.0–34.0)
MCHC: 33.7 g/dL (ref 30.0–36.0)
MCV: 88.6 fL (ref 80.0–100.0)
Platelets: 255 10*3/uL (ref 150–400)
RBC: 4.49 MIL/uL (ref 3.87–5.11)
RDW: 13.6 % (ref 11.5–15.5)
WBC: 7 10*3/uL (ref 4.0–10.5)
nRBC: 0 % (ref 0.0–0.2)

## 2023-03-20 MED ORDER — GUAIFENESIN-DM 100-10 MG/5ML PO SYRP: 5.0000 mL | ORAL_SOLUTION | ORAL | 0 refills | Status: DC | PRN

## 2023-03-20 MED ORDER — QUETIAPINE FUMARATE 25 MG PO TABS: 12.5000 mg | ORAL_TABLET | Freq: Every day | ORAL | Status: DC

## 2023-03-20 MED ORDER — AMOXICILLIN-POT CLAVULANATE 875-125 MG PO TABS: 1.0000 | ORAL_TABLET | Freq: Two times a day (BID) | ORAL | 0 refills | Status: AC

## 2023-03-20 MED ORDER — GUAIFENESIN-DM 100-10 MG/5ML PO SYRP
5.0000 mL | ORAL_SOLUTION | ORAL | Status: DC | PRN
  Administered 2023-03-20: 5 mL via ORAL
  Filled 2023-03-20: qty 10

## 2023-03-20 MED ORDER — POTASSIUM CHLORIDE CRYS ER 20 MEQ PO TBCR: 20.0000 meq | EXTENDED_RELEASE_TABLET | Freq: Every day | ORAL | 0 refills | Status: DC

## 2023-03-20 MED ORDER — POTASSIUM CHLORIDE 20 MEQ PO PACK
40.0000 meq | PACK | Freq: Once | ORAL | Status: AC
  Administered 2023-03-20: 40 meq via ORAL
  Filled 2023-03-20: qty 2

## 2023-03-20 NOTE — TOC Progression Note (Signed)
Transition of Care [facility]) - Progression Note    Patient Details  Name: Deborah Velazquez MRN: [id number] Date of Birth: 10-Oct-1946  Transition [facility]) CM/SW Contact  Marlowe Sax, RN Phone Number: 03/20/2023, 9:31 AM  Clinical Narrative:     Sherron Monday with Marcelino Duster the patient's daughter and confirmed the address where she is going, Confirmed the [employer] lift will be delivered by [employer] to the home on Friday, [facility] accepted for [facility] called and arranged transport  Expected Discharge Plan: Home w Home Health Services Barriers to Discharge: Continued Medical Work up  Expected Discharge Plan and Services   Discharge Planning Services: CM Consult   Living arrangements for the past 2 months: Single Family Home Expected Discharge Date: 03/20/23               DME Arranged: Other see comment [employer] Lift) DME Agency: [employer] Date DME Agency Contacted: 03/19/23 Time DME Agency Contacted: 1224 Representative spoke with at DME Agency: Barbara Cower HH Arranged: PT, OT [facility] Agency: [facility] Date [facility] Agency Contacted: 03/19/23 Time HH Agency Contacted: 1224 Representative spoke with at [facility] Agency: Kandee Keen   Social Determinants of Health (SDOH) Interventions SDOH Screenings   Food Insecurity: No Food Insecurity (03/17/2023)  Housing: Low Risk  (03/17/2023)  Transportation Needs: No Transportation Needs (03/17/2023)  Utilities: Not At Risk (03/17/2023)  Depression (PHQ2-9): Low Risk  (01/04/2021)  Financial Resource Strain: Low Risk  (06/22/2020)  Social Connections: Unknown (06/22/2020)  Stress: No Stress Concern Present (06/16/2019)  Tobacco Use: Low Risk  (03/17/2023)    Readmission Risk Interventions     No data to display

## 2023-03-20 NOTE — Discharge Summary (Addendum)
Physician Discharge Summary   Patient: Deborah Velazquez MRN: [id number] DOB: [birth year]  Admit date:     03/17/2023  Discharge date: 03/20/23  Discharge Physician: Alford Highland   PCP: Sherlene Shams, MD   Recommendations at discharge:   Follow-up PCP 5 days  Discharge Diagnoses: Principal Problem:   Acute hypoxemic respiratory failure Active Problems:   Pneumonia due to human metapneumovirus   Aspiration pneumonia of right lower lobe   Parkinson's disease   Lewy body dementia   Acute metabolic encephalopathy   Overweight (BMI 25.0-29.9)   History of TIA (transient ischemic attack)   Hypokalemia   Hospital Course: Deborah Velazquez is a 77 y.o. female with medical history significant of Parkinson disease, Lewy body dementia, dyslipidemia, history of TIA who presents to the hospital with a low-grade fever, altered mental status, generalized weakness and a cough.  Chest x-ray showed a right lower lobe pneumonia suspect aspiration pneumonia.  She was found to have a hypoxemia with o2 saturation of 89%, was placed on 4 L oxygen.  She is treated with Unasyn. Respiratory panel also positive for human metapneumovirus.  4/24.  Patient able to come off oxygen. 4/25.  Family would like to take patient home with 24/7 care.  Assessment and Plan: * Acute hypoxemic respiratory failure ER physician documented pulse ox of 86%.  Patient was on 4 L of oxygen this morning.  Able to come off oxygen on 4/24.  Pneumonia due to human metapneumovirus Supportive care.  Aspiration pneumonia of right lower lobe Received Unasyn while here.  Not sure if this is aspiration pneumonia that needs to be treated or not but will complete a short course with Augmentin upon going home.  Procalcitonin was negative.  Likely more metapneumovirus as a cause.  Hypokalemia Potassium replacement today and upon discharge for few more days.  History of TIA (transient ischemic attack) On aspirin and  statin.  Overweight (BMI 25.0-29.9) Last BMI 27.44  Acute metabolic encephalopathy Likely secondary to infection and Parkinson's dementia.  Lewy body dementia Patient's mental status waxed and waned here in the hospital but was able to answer questions for me on both days that I saw her.  Parkinson's disease Continue Sinemet 5 times a day         Consultants: None Procedures performed: None Disposition: Home health Diet recommendation:  Regular DISCHARGE MEDICATION: Allergies as of 03/20/2023       Reactions   Pseudoephedrine Hcl Anxiety, Other (See Comments)   Nervous   Sudafed [pseudoephedrine] Anxiety, Other (See Comments)   Nervous        Medication List     TAKE these medications    amoxicillin-clavulanate 875-125 MG tablet Commonly known as: AUGMENTIN Take 1 tablet by mouth 2 (two) times daily for 5 doses.   aspirin EC 81 MG tablet Take 81 mg by mouth daily. Swallow whole.   atorvastatin 10 MG tablet Commonly known as: LIPITOR Take 1 tablet (10 mg total) by mouth at bedtime.   carbidopa-levodopa 50-200 MG tablet Commonly known as: SINEMET CR Take 1 tablet by mouth 5 (five) times daily. Patient takes at 0700,1030,1400,1730,2200 What changed: Another medication with the same name was removed. Continue taking this medication, and follow the directions you see here.   DULoxetine 60 MG capsule Commonly known as: CYMBALTA Take 60 mg by mouth daily.   estradiol 0.1 MG/GM vaginal cream Commonly known as: ESTRACE INSERT 1 APPLICATORFUL VAGINALLY 2 TIMES A WEEK AS DIRECTED   guaiFENesin-dextromethorphan 100-10 MG/5ML  syrup Commonly known as: ROBITUSSIN DM Take 5 mLs by mouth every 4 (four) hours as needed for cough.   hydrOXYzine 25 MG tablet Commonly known as: ATARAX Take 25 mg by mouth 2 (two) times daily as needed.   lamoTRIgine 100 MG tablet Commonly known as: LAMICTAL Take 100 mg by mouth 2 (two) times daily.   nystatin powder Commonly  known as: MYCOSTATIN/NYSTOP Apply 1 application. topically 2 (two) times daily. To rash until resolved.   OVER THE COUNTER MEDICATION Take 1 capsule by mouth in the morning and at bedtime. Doterra Life Long Vitality Pack   potassium chloride SA 20 MEQ tablet Commonly known as: KLOR-CON M Take 1 tablet (20 mEq total) by mouth daily for 3 days.   pregabalin 150 MG capsule Commonly known as: LYRICA Take 150 mg by mouth 2 (two) times daily.   QUEtiapine 25 MG tablet Commonly known as: SEROQUEL Take 0.5 tablets (12.5 mg total) by mouth at bedtime. What changed: when to take this   sertraline 100 MG tablet Commonly known as: ZOLOFT Take 200 mg by mouth daily.        Follow-up Information     Sherlene Shams, MD Follow up in 5 day(s).   Specialty: Internal Medicine Contact information: [address] [place] [address] [phone]         Sherlene Shams, MD Follow up on 03/27/2023.   Specialty: Internal Medicine Why: at [facility] information: [address] [place] [address] [phone]                Discharge Exam: Ceasar Mons Weights   03/17/23 1127  Weight: 74.8 kg   Physical Exam HENT:     Head: Normocephalic.     Mouth/Throat:     Pharynx: No oropharyngeal exudate.  Eyes:     General: Lids are normal.     Conjunctiva/sclera: Conjunctivae normal.  Cardiovascular:     Rate and Rhythm: Normal rate and regular rhythm.     Heart sounds: Normal heart sounds, S1 normal and S2 normal.  Pulmonary:     Breath sounds: Examination of the right-lower field reveals decreased breath sounds. Examination of the left-lower field reveals decreased breath sounds. Decreased breath sounds present. No wheezing, rhonchi or rales.  Abdominal:     Palpations: Abdomen is soft.     Tenderness: There is no abdominal tenderness.  Musculoskeletal:     Right lower leg: No swelling.     Left lower leg: No swelling.  Neurological:      Mental Status: She is alert.     Comments: Answers questions and able to straight leg raise.      Condition at discharge: stable  The results of significant diagnostics from this hospitalization (including imaging, microbiology, ancillary and laboratory) are listed below for reference.   Imaging Studies: CT HEAD WO CONTRAST ( )  Result Date: 03/17/2023 CLINICAL DATA:  New onset headache EXAM: CT HEAD WITHOUT CONTRAST TECHNIQUE: Contiguous axial images were obtained from the base of the skull through the vertex without intravenous contrast. RADIATION DOSE REDUCTION: This exam was performed according to the departmental dose-optimization program which includes automated exposure control, adjustment of the mA and/or kV according to patient size and/or use of iterative reconstruction technique. COMPARISON:  10/01/2021 FINDINGS: Brain: Chronic small vessel ischemic changes are again seen throughout the periventricular white matter and bilateral basal ganglia. No evidence of acute infarct or hemorrhage. Stable bilateral cortical atrophy with ex vacuo dilatation of  the lateral ventricles. Remaining midline structures are unremarkable. No acute extra-axial fluid collections. No mass effect. Vascular: No hyperdense vessel or unexpected calcification. Skull: Normal. Negative for fracture or focal lesion. Sinuses/Orbits: No acute finding. Other: None. IMPRESSION: 1. No acute intracranial process. Electronically Signed   By: Sharlet Salina M.D.   On: 03/17/2023 16:24   CT Angio Chest PE W and/or Wo Contrast  Result Date: 03/17/2023 CLINICAL DATA:  Cough abdominal pain EXAM: CT ANGIOGRAPHY CHEST CT ABDOMEN AND PELVIS WITH CONTRAST TECHNIQUE: Multidetector CT imaging of the chest was performed using the standard protocol during bolus administration of intravenous contrast. Multiplanar CT image reconstructions and MIPs were obtained to evaluate the vascular anatomy. Multidetector CT imaging of the abdomen and  pelvis was performed using the standard protocol during bolus administration of intravenous contrast. RADIATION DOSE REDUCTION: This exam was performed according to the departmental dose-optimization program which includes automated exposure control, adjustment of the mA and/or kV according to patient size and/or use of iterative reconstruction technique. CONTRAST:  OMNIPAQUE IOHEXOL 350 MG/ML SOLN COMPARISON:  None Available. FINDINGS: CTA CHEST FINDINGS Cardiovascular: No evidence of pulmonary embolus. Normal heart size. No pericardial effusion. Normal caliber thoracic aorta with mild atherosclerotic disease. Mild coronary artery calcifications. Mediastinum/Nodes: Esophagus and thyroid are unremarkable. Mildly enlarged right hilar lymph nodes, likely reactive. Reference node measuring 11 mm in short axis on series 6, image 64. Lungs/Pleura: Central airways are patent. Ground-glass opacities of the right-greater-than-left lower lobes and posterior right upper and middle lobes. Musculoskeletal: No chest wall abnormality. No acute or significant osseous findings. Review of the MIP images confirms the above findings. CT ABDOMEN and PELVIS FINDINGS Hepatobiliary: No focal liver abnormality is seen. No gallstones, gallbladder wall thickening, or biliary dilatation. Pancreas: Unremarkable. No pancreatic ductal dilatation or surrounding inflammatory changes. Spleen: Normal in size without focal abnormality. Adrenals/Urinary Tract: Adrenal glands are unremarkable. Kidneys are normal, without renal calculi, focal lesion, or hydronephrosis. Bladder wall thickening. Stomach/Bowel: Stomach is within normal limits. No evidence of bowel wall thickening, distention, or inflammatory changes. Vascular/Lymphatic: Aortic atherosclerosis. No enlarged abdominal or pelvic lymph nodes. Reproductive: Oblong cystic lesion located between the vaginal canal and rectum measuring 5.2 x 2.0 x 6.2 cm. Retroverted uterus. No adnexal mass.  Other: No abdominal wall hernia or abnormality. No abdominopelvic ascites. Musculoskeletal: No acute or significant osseous findings. Review of the MIP images confirms the above findings. IMPRESSION: 1. No evidence of pulmonary embolus. 2. Ground-glass opacities of the right-greater-than-left lower lobes and posterior right upper and middle lobes, likely due to aspiration, infection could also have this appearance. 3. Mildly enlarged right hilar lymph nodes, likely reactive. 4. Bladder wall thickening findings can be seen in the setting of cystitis, recommend correlation with urinalysis. 5. Oblong cystic lesion located between the vaginal canal and rectum. Recommend pelvic ultrasound for further evaluation. 6. Aortic Atherosclerosis (ICD10-I70.0). Electronically Signed   By: Allegra Lai M.D.   On: 03/17/2023 16:20   CT ABDOMEN PELVIS W CONTRAST  Result Date: 03/17/2023 CLINICAL DATA:  Cough abdominal pain EXAM: CT ANGIOGRAPHY CHEST CT ABDOMEN AND PELVIS WITH CONTRAST TECHNIQUE: Multidetector CT imaging of the chest was performed using the standard protocol during bolus administration of intravenous contrast. Multiplanar CT image reconstructions and MIPs were obtained to evaluate the vascular anatomy. Multidetector CT imaging of the abdomen and pelvis was performed using the standard protocol during bolus administration of intravenous contrast. RADIATION DOSE REDUCTION: This exam was performed according to the departmental dose-optimization program which includes automated  exposure control, adjustment of the mA and/or kV according to patient size and/or use of iterative reconstruction technique. CONTRAST:  OMNIPAQUE IOHEXOL 350 MG/ML SOLN COMPARISON:  None Available. FINDINGS: CTA CHEST FINDINGS Cardiovascular: No evidence of pulmonary embolus. Normal heart size. No pericardial effusion. Normal caliber thoracic aorta with mild atherosclerotic disease. Mild coronary artery calcifications.  Mediastinum/Nodes: Esophagus and thyroid are unremarkable. Mildly enlarged right hilar lymph nodes, likely reactive. Reference node measuring 11 mm in short axis on series 6, image 64. Lungs/Pleura: Central airways are patent. Ground-glass opacities of the right-greater-than-left lower lobes and posterior right upper and middle lobes. Musculoskeletal: No chest wall abnormality. No acute or significant osseous findings. Review of the MIP images confirms the above findings. CT ABDOMEN and PELVIS FINDINGS Hepatobiliary: No focal liver abnormality is seen. No gallstones, gallbladder wall thickening, or biliary dilatation. Pancreas: Unremarkable. No pancreatic ductal dilatation or surrounding inflammatory changes. Spleen: Normal in size without focal abnormality. Adrenals/Urinary Tract: Adrenal glands are unremarkable. Kidneys are normal, without renal calculi, focal lesion, or hydronephrosis. Bladder wall thickening. Stomach/Bowel: Stomach is within normal limits. No evidence of bowel wall thickening, distention, or inflammatory changes. Vascular/Lymphatic: Aortic atherosclerosis. No enlarged abdominal or pelvic lymph nodes. Reproductive: Oblong cystic lesion located between the vaginal canal and rectum measuring 5.2 x 2.0 x 6.2 cm. Retroverted uterus. No adnexal mass. Other: No abdominal wall hernia or abnormality. No abdominopelvic ascites. Musculoskeletal: No acute or significant osseous findings. Review of the MIP images confirms the above findings. IMPRESSION: 1. No evidence of pulmonary embolus. 2. Ground-glass opacities of the right-greater-than-left lower lobes and posterior right upper and middle lobes, likely due to aspiration, infection could also have this appearance. 3. Mildly enlarged right hilar lymph nodes, likely reactive. 4. Bladder wall thickening findings can be seen in the setting of cystitis, recommend correlation with urinalysis. 5. Oblong cystic lesion located between the vaginal canal and  rectum. Recommend pelvic ultrasound for further evaluation. 6. Aortic Atherosclerosis (ICD10-I70.0). Electronically Signed   By: Allegra Lai M.D.   On: 03/17/2023 16:20   DG Chest 2 View  Result Date: 03/17/2023 CLINICAL DATA:  Wheezing. Technologist notes best images obtainable. Concern for pneumonia with worsening cough and fever. EXAM: CHEST - 2 VIEW COMPARISON:  Chest radiograph 11/30/2005 FINDINGS: Of note, the patient is moderately rotated on this examination. Heart is normal in size, accounting for patient positioning. Mild diffuse interstitial coarsening. No focal consolidation, pleural effusion, or pneumothorax. Diffuse osteopenia. IMPRESSION: Mild diffuse interstitial coarsening, which may be chronic or due to mild interstitial edema. No focal consolidation. Electronically Signed   By: Sherron Ales M.D.   On: 03/17/2023 12:33    Microbiology: Results for orders placed or performed during the hospital encounter of 03/17/23  SARS Coronavirus 2 by RT PCR (hospital order, performed in [facility] hospital lab) *cepheid single result test* Anterior Nasal Swab     Status: None   Collection Time: 03/17/23  2:54 PM   Specimen: Anterior Nasal Swab  Result Value Ref Range Status   SARS Coronavirus 2 by RT PCR NEGATIVE NEGATIVE Final    Comment: (NOTE) SARS-CoV-2 target nucleic acids are NOT DETECTED.  The SARS-CoV-2 RNA is generally detectable in upper and lower respiratory specimens during the acute phase of infection. The lowest concentration of SARS-CoV-2 viral copies this assay can detect is 250 copies / mL. A negative result does not preclude SARS-CoV-2 infection and should not be used as the sole basis for treatment or other patient management decisions.  A  negative result may occur with improper specimen collection / handling, submission of specimen other than nasopharyngeal swab, presence of viral mutation(s) within the areas targeted by this assay, and inadequate number of viral  copies (<250 copies / mL). A negative result must be combined with clinical observations, patient history, and epidemiological information.  Fact Sheet for Patients:   RoadLapTop.co.za  Fact Sheet for Healthcare Providers: http://kim-miller.com/  This test is not yet approved or  cleared by the [place] FDA and has been authorized for detection and/or diagnosis of SARS-CoV-2 by FDA under an Emergency Use Authorization (EUA).  This EUA will remain in effect (meaning this test can be used) for the duration of the COVID-19 declaration under Section 564(b)(1) of the Act, 21 U.S.C. section 360bbb-3(b)(1), unless the authorization is terminated or revoked sooner.  Performed at [facility], [address] [place], [place] [address]   Blood culture (routine x 2)     Status: None (Preliminary result)   Collection Time: 03/17/23  5:28 PM   Specimen: BLOOD  Result Value Ref Range Status   Specimen Description BLOOD LEFT ANTECUBITAL  Final   Special Requests   Final    BOTTLES DRAWN AEROBIC AND ANAEROBIC Blood Culture results may not be optimal due to an excessive volume of blood received in culture bottles   Culture   Final    NO GROWTH 3 DAYS Performed at [facility], [address] [place], [place] [address]    Report Status PENDING  Incomplete  Blood culture (routine x 2)     Status: None (Preliminary result)   Collection Time: 03/17/23  5:29 PM   Specimen: BLOOD  Result Value Ref Range Status   Specimen Description BLOOD BLOOD RIGHT HAND  Final   Special Requests   Final    BOTTLES DRAWN AEROBIC AND ANAEROBIC Blood Culture adequate volume   Culture   Final    NO GROWTH 3 DAYS Performed at [facility], [address], [place], [place] [address]    Report Status PENDING  Incomplete  Respiratory (~20 pathogens) panel by PCR     Status: Abnormal   Collection Time: 03/17/23  5:37 PM   Specimen:  Nasopharyngeal Swab; Respiratory  Result Value Ref Range Status   Adenovirus NOT DETECTED NOT DETECTED Final   Coronavirus 229E NOT DETECTED NOT DETECTED Final    Comment: (NOTE) The Coronavirus on the Respiratory Panel, DOES NOT test for the novel  Coronavirus (2019 nCoV)    Coronavirus HKU1 NOT DETECTED NOT DETECTED Final   Coronavirus NL63 NOT DETECTED NOT DETECTED Final   Coronavirus OC43 NOT DETECTED NOT DETECTED Final   Metapneumovirus DETECTED (A) NOT DETECTED Final   Rhinovirus / Enterovirus NOT DETECTED NOT DETECTED Final   Influenza A NOT DETECTED NOT DETECTED Final   Influenza B NOT DETECTED NOT DETECTED Final   Parainfluenza Virus 1 NOT DETECTED NOT DETECTED Final   Parainfluenza Virus 2 NOT DETECTED NOT DETECTED Final   Parainfluenza Virus 3 NOT DETECTED NOT DETECTED Final   Parainfluenza Virus 4 NOT DETECTED NOT DETECTED Final   Respiratory Syncytial Virus NOT DETECTED NOT DETECTED Final   Bordetella pertussis NOT DETECTED NOT DETECTED Final   Bordetella Parapertussis NOT DETECTED NOT DETECTED Final   Chlamydophila pneumoniae NOT DETECTED NOT DETECTED Final   Mycoplasma pneumoniae NOT DETECTED NOT DETECTED Final    Comment: Performed at [facility], [address] [place], [place] [address]    Labs: CBC: Recent Labs  Lab 03/17/23 1132 03/20/23 0651  WBC 4.8 7.0  HGB 12.9 13.4  HCT 40.4 39.8  MCV 93.1 88.6  PLT 230 255   Basic Metabolic Panel: Recent Labs  Lab 03/17/23 1132 03/20/23 0651  NA 140 142  K 3.9 3.0*  CL 105 108  CO2 27 24  GLUCOSE 118* 113*  BUN 12 6*  CREATININE 0.65 0.66  CALCIUM 8.8* 9.2   Liver Function Tests: Recent Labs  Lab 03/17/23 1132  AST 21  ALT 9  ALKPHOS 58  BILITOT 0.6  PROT 7.1  ALBUMIN 3.6    Discharge time spent: greater than 30 minutes.  Signed: Alford Highland, MD [facility] 03/20/2023

## 2023-03-20 NOTE — Assessment & Plan Note (Signed)
Potassium replacement today and upon discharge for few more days.

## 2023-03-20 NOTE — Care Management Important Message (Signed)
Important Message  Patient Details  Name: Deborah Velazquez MRN: [id number] Date of Birth: [birth year]   Medicare Important Message Given:  N/A - LOS <3 / Initial given by admissions     [name] A Connell Bognar 03/20/2023, 9:00 AM

## 2023-03-21 ENCOUNTER — Telehealth: Payer: Self-pay | Admitting: *Deleted

## 2023-03-21 ENCOUNTER — Ambulatory Visit: Payer: Self-pay | Admitting: *Deleted

## 2023-03-21 DIAGNOSIS — Z8673 Personal history of transient ischemic attack (TIA), and cerebral infarction without residual deficits: Secondary | ICD-10-CM | POA: Diagnosis not present

## 2023-03-21 DIAGNOSIS — G3183 Dementia with Lewy bodies: Secondary | ICD-10-CM | POA: Diagnosis not present

## 2023-03-21 LAB — CULTURE, BLOOD (ROUTINE X 2): Culture: NO GROWTH

## 2023-03-21 NOTE — Transitions of Care (Post Inpatient/ED Visit) (Signed)
   03/21/2023  Name: Deborah Velazquez MRN: [id number] DOB: 09-22-1946  Today's TOC FU Call Status: Today's TOC FU Call Status:: Unsuccessul Call (1st Attempt) Unsuccessful Call (1st Attempt) Date: 03/21/23  Attempted to reach the patient regarding the most recent Inpatient/ED visit.  Follow Up Plan: Additional outreach attempts will be made to reach the patient to complete the Transitions of Care (Post Inpatient/ED visit) call.   Gean Maidens BSN RN [facility] [phone]

## 2023-03-21 NOTE — Chronic Care Management (AMB) (Signed)
   03/21/2023  Deborah Velazquez 12-04-1945 [id number]   Patient is not enrolled in CCM services, enrollment status changed to previously enrolled.  Irving Shows [facility], BSN RN Case Manager [phone]

## 2023-03-22 LAB — CULTURE, BLOOD (ROUTINE X 2): Special Requests: ADEQUATE

## 2023-03-24 ENCOUNTER — Telehealth: Payer: Self-pay | Admitting: *Deleted

## 2023-03-24 NOTE — Transitions of Care (Post Inpatient/ED Visit) (Signed)
   03/24/2023  Name: Deborah Velazquez MRN: [id number] DOB: Jan 27, 1946  Today's TOC FU Call Status: Today's TOC FU Call Status:: Successful TOC FU Call Competed TOC FU Call Complete Date: 03/24/23  Transition Care Management Follow-up Telephone Call Date of Discharge: 03/20/23 Discharge Facility: [facility]) Type of Discharge: Inpatient Admission Primary Inpatient Discharge Diagnosis:: acute hypoxemic respiratory failure How have you been since you were released from the hospital?: Better Any questions or concerns?: No  Items Reviewed: Did you receive and understand the discharge instructions provided?: Yes Medications obtained and verified?: Yes (Medications Reviewed) Any new allergies since your discharge?: No Dietary orders reviewed?: Yes Type of Diet Ordered:: low sodium heart healthy. Dealer is giving patient Bratt diet since patient had diarrhea from ATBX since discharged. BM are beginning to firm up.) Do you have support at home?: Yes People in Home: spouse Name of Support/Comfort Primary Source: louise caregiver  Home Care and Equipment/Supplies: Were Home Health Services Ordered?: Yes Name of Home Health Agency:: [facility] Has Agency set up a time to come to your home?: Yes First Home Health Visit Date: 03/24/23 Any new equipment or medical supplies ordered?: Yes Name of Medical supply agency?: [facility] Were you able to get the equipment/medical supplies?: Yes Do you have any questions related to the use of the equipment/supplies?: No  Functional Questionnaire: Do you need assistance with bathing/showering or dressing?: Yes Do you need assistance with meal preparation?: Yes Do you need assistance with eating?: No Do you have difficulty maintaining continence: No Do you need assistance with getting out of bed/getting out of a chair/moving?: No Do you have difficulty managing or taking your medications?: Yes  Follow up appointments  reviewed: PCP Follow-up appointment confirmed?: Yes Date of PCP follow-up appointment?: 03/27/23 Follow-up Provider: Dr Darrick Huntsman 11 AM Specialist Hospital Follow-up appointment confirmed?: NA Do you need transportation to your follow-up appointment?: No Do you understand care options if your condition(s) worsen?: Yes-patient verbalized understanding  SDOH Interventions Today    Flowsheet Row Most Recent Value  SDOH Interventions   Food Insecurity Interventions Intervention Not Indicated  Housing Interventions Intervention Not Indicated  Transportation Interventions Intervention Not Indicated      Interventions Today    Flowsheet Row Most Recent Value  General Interventions   General Interventions Discussed/Reviewed General Interventions Discussed, General Interventions Reviewed, Doctor Visits  Doctor Visits Discussed/Reviewed Doctor Visits Discussed, Doctor Visits Reviewed      [facility] Interventions Today    Flowsheet Row Most Recent Value  TOC Interventions   TOC Interventions Discussed/Reviewed TOC Interventions Discussed, TOC Interventions Reviewed       Gean Maidens BSN RN [facility] [phone]

## 2023-03-25 ENCOUNTER — Telehealth: Payer: Self-pay | Admitting: Internal Medicine

## 2023-03-25 NOTE — Telephone Encounter (Signed)
FYI

## 2023-03-25 NOTE — Telephone Encounter (Signed)
Deborah Velazquez, [facility], [phone]. Lawson Fiscal went out for assessment. Patient's family stated they have all the equipment needed, and caregiver 24/7, they give her exercises. Deborah Velazquez stated that patient did not need their services at this time.

## 2023-03-27 ENCOUNTER — Inpatient Hospital Stay: Payer: PPO | Admitting: Internal Medicine

## 2023-04-08 ENCOUNTER — Ambulatory Visit (INDEPENDENT_AMBULATORY_CARE_PROVIDER_SITE_OTHER): Payer: PPO | Admitting: Internal Medicine

## 2023-04-08 ENCOUNTER — Encounter: Payer: Self-pay | Admitting: Internal Medicine

## 2023-04-08 VITALS — BP 120/80 | HR 68 | Ht 65.0 in | Wt 157.0 lb

## 2023-04-08 DIAGNOSIS — J690 Pneumonitis due to inhalation of food and vomit: Secondary | ICD-10-CM

## 2023-04-08 DIAGNOSIS — J9601 Acute respiratory failure with hypoxia: Secondary | ICD-10-CM | POA: Diagnosis not present

## 2023-04-08 DIAGNOSIS — R197 Diarrhea, unspecified: Secondary | ICD-10-CM | POA: Diagnosis not present

## 2023-04-08 DIAGNOSIS — E876 Hypokalemia: Secondary | ICD-10-CM

## 2023-04-08 DIAGNOSIS — R7303 Prediabetes: Secondary | ICD-10-CM | POA: Diagnosis not present

## 2023-04-08 DIAGNOSIS — G20B2 Parkinson's disease with dyskinesia, with fluctuations: Secondary | ICD-10-CM

## 2023-04-08 NOTE — Assessment & Plan Note (Signed)
Resolved clinically 

## 2023-04-08 NOTE — Progress Notes (Signed)
Subjective:  Patient ID: Deborah Velazquez, female    DOB: 1946/11/14  Age: 77 y.o. MRN: [id number]  CC: The primary encounter diagnosis was Diarrhea, unspecified type. Diagnoses of Prediabetes, Parkinson's disease with dyskinesia and fluctuating manifestations, Aspiration pneumonia of right lower lobe due to gastric secretions (HCC), Acute hypoxemic respiratory failure (HCC), and Hypokalemia were also pertinent to this visit.   HPI Deborah Velazquez presents for  Chief Complaint  Patient presents with   Medical Management of Chronic Issues    6 month follow up     "Valentina Gu" is accompanied by [name] and Marcelino Duster   1)Prediabetes:  has lost 8 lbs since August. Due to diarrhea   2) recent admission to [facility]  for hypoxic resp failure secondary to viral PNA.  From April 22 to April 25.   Developed uncontrollable diarrhea in hospital   due to excessive laxatives  but now improving   on bland  diet.  TOC attempted x 2, unsuccessful attempts  3) Parkinson's.  2 aides are amazing.  Need a 3rd shift aide (one caregiver quit today) . Not exercising , motivation is very intermittent . Modafinil offered but not started . She has days of increased alertness and interaction alternating with days of excessive lethargy . Does not want to risk agitation since she has been physically aggressive   4) GAD:  seeing Maryruth Bun.  Did not tolerate cymbalta suspension .  Mood improved and back to baseline   Outpatient Medications Prior to Visit  Medication Sig Dispense Refill   aspirin EC 81 MG tablet Take 81 mg by mouth daily. Swallow whole.     atorvastatin (LIPITOR) 10 MG tablet Take 1 tablet (10 mg total) by mouth at bedtime. 90 tablet 3   carbidopa-levodopa (SINEMET CR) 50-200 MG tablet Take 1 tablet by mouth 5 (five) times daily. Patient takes at 0700,1030,1400,1730,2200     DULoxetine (CYMBALTA) 60 MG capsule Take 60 mg by mouth daily.      estradiol (ESTRACE) 0.1 MG/GM vaginal cream INSERT 1 APPLICATORFUL VAGINALLY  2 TIMES A WEEK AS DIRECTED 42.5 g 0   guaiFENesin-dextromethorphan (ROBITUSSIN DM) 100-10 MG/5ML syrup Take 5 mLs by mouth every 4 (four) hours as needed for cough. 118 mL 0   hydrOXYzine (ATARAX/VISTARIL) 25 MG tablet Take 25 mg by mouth 2 (two) times daily as needed.     lamoTRIgine (LAMICTAL) 100 MG tablet Take 100 mg by mouth 2 (two) times daily.     nystatin (MYCOSTATIN/NYSTOP) powder Apply 1 application. topically 2 (two) times daily. To rash until resolved. 15 g 0   OVER THE COUNTER MEDICATION Take 1 capsule by mouth in the morning and at bedtime. Doterra Life Long Vitality Pack     pregabalin (LYRICA) 150 MG capsule Take 150 mg by mouth 2 (two) times daily.      QUEtiapine (SEROQUEL) 25 MG tablet Take 0.5 tablets (12.5 mg total) by mouth at bedtime.     sertraline (ZOLOFT) 100 MG tablet Take 200 mg by mouth daily.      potassium chloride SA (KLOR-CON M) 20 MEQ tablet Take 1 tablet (20 mEq total) by mouth daily for 3 days. 3 tablet 0   No facility-administered medications prior to visit.    Review of Systems;  Patient denies headache, fevers, malaise, unintentional weight loss, skin rash, eye pain, sinus congestion and sinus pain, sore throat, dysphagia,  hemoptysis , cough, dyspnea, wheezing, chest pain, palpitations, orthopnea, edema, abdominal pain, nausea, melena, diarrhea, constipation, flank pain,  dysuria, hematuria, urinary  Frequency, nocturia, numbness, tingling, seizures,  Focal weakness, Loss of consciousness,  Tremor, insomnia, depression, anxiety, and suicidal ideation.      Objective:  BP 120/80   Pulse 68   Ht 5\' 5"  (1.651 m)   Wt 157 lb (71.2 kg)   SpO2 94%   BMI 26.13 kg/m   BP Readings from Last 3 Encounters:  04/08/23 120/80  03/20/23 (!) 149/80  07/16/22 100/66    Wt Readings from Last 3 Encounters:  04/08/23 157 lb (71.2 kg)  03/17/23 164 lb 14.5 oz (74.8 kg)  07/16/22 164 lb 12.8 oz (74.8 kg)    Physical Exam Vitals reviewed.  Constitutional:       General: She is sleeping. She is not in acute distress.    Appearance: Normal appearance. She is normal weight. She is not ill-appearing, toxic-appearing or diaphoretic.  HENT:     Head: Normocephalic.  Eyes:     General: No scleral icterus.       Right eye: No discharge.        Left eye: No discharge.     Conjunctiva/sclera: Conjunctivae normal.  Cardiovascular:     Rate and Rhythm: Normal rate and regular rhythm.     Heart sounds: Normal heart sounds.  Pulmonary:     Effort: Pulmonary effort is normal. No respiratory distress.     Breath sounds: Normal breath sounds.  Musculoskeletal:        General: Normal range of motion.  Skin:    General: Skin is warm and dry.  Neurological:     General: No focal deficit present.     Mental Status: She is oriented to person, place, and time. Mental status is at baseline. She is lethargic.  Psychiatric:        Mood and Affect: Mood normal.        Behavior: Behavior normal.        Thought Content: Thought content normal.        Judgment: Judgment normal.    Lab Results  Component Value Date   HGBA1C 6.4 07/16/2022   HGBA1C 6.2 01/10/2022   HGBA1C 6.3 10/23/2020    Lab Results  Component Value Date   CREATININE 0.66 03/20/2023   CREATININE 0.65 03/17/2023   CREATININE 0.86 07/16/2022    Lab Results  Component Value Date   WBC 7.0 03/20/2023   HGB 13.4 03/20/2023   HCT 39.8 03/20/2023   PLT 255 03/20/2023   GLUCOSE 113 (H) 03/20/2023   CHOL 247 (H) 07/16/2022   TRIG 293.0 (H) 07/16/2022   HDL 65.40 07/16/2022   LDLDIRECT 129.0 07/16/2022   LDLCALC 81 07/09/2018   ALT 9 03/17/2023   AST 21 03/17/2023   NA 142 03/20/2023   K 3.0 (L) 03/20/2023   CL 108 03/20/2023   CREATININE 0.66 03/20/2023   BUN 6 (L) 03/20/2023   CO2 24 03/20/2023   TSH 2.47 07/16/2022   HGBA1C 6.4 07/16/2022   MICROALBUR 0.7 06/29/2020    CT HEAD WO CONTRAST ( )  Result Date: 03/17/2023 CLINICAL DATA:  New onset headache EXAM: CT HEAD  WITHOUT CONTRAST TECHNIQUE: Contiguous axial images were obtained from the base of the skull through the vertex without intravenous contrast. RADIATION DOSE REDUCTION: This exam was performed according to the departmental dose-optimization program which includes automated exposure control, adjustment of the mA and/or kV according to patient size and/or use of iterative reconstruction technique. COMPARISON:  10/01/2021 FINDINGS: Brain: Chronic small vessel ischemic changes are  again seen throughout the periventricular white matter and bilateral basal ganglia. No evidence of acute infarct or hemorrhage. Stable bilateral cortical atrophy with ex vacuo dilatation of the lateral ventricles. Remaining midline structures are unremarkable. No acute extra-axial fluid collections. No mass effect. Vascular: No hyperdense vessel or unexpected calcification. Skull: Normal. Negative for fracture or focal lesion. Sinuses/Orbits: No acute finding. Other: None. IMPRESSION: 1. No acute intracranial process. Electronically Signed   By: Sharlet Salina M.D.   On: 03/17/2023 16:24   CT Angio Chest PE W and/or Wo Contrast  Result Date: 03/17/2023 CLINICAL DATA:  Cough abdominal pain EXAM: CT ANGIOGRAPHY CHEST CT ABDOMEN AND PELVIS WITH CONTRAST TECHNIQUE: Multidetector CT imaging of the chest was performed using the standard protocol during bolus administration of intravenous contrast. Multiplanar CT image reconstructions and MIPs were obtained to evaluate the vascular anatomy. Multidetector CT imaging of the abdomen and pelvis was performed using the standard protocol during bolus administration of intravenous contrast. RADIATION DOSE REDUCTION: This exam was performed according to the departmental dose-optimization program which includes automated exposure control, adjustment of the mA and/or kV according to patient size and/or use of iterative reconstruction technique. CONTRAST:  OMNIPAQUE IOHEXOL 350 MG/ML SOLN COMPARISON:   None Available. FINDINGS: CTA CHEST FINDINGS Cardiovascular: No evidence of pulmonary embolus. Normal heart size. No pericardial effusion. Normal caliber thoracic aorta with mild atherosclerotic disease. Mild coronary artery calcifications. Mediastinum/Nodes: Esophagus and thyroid are unremarkable. Mildly enlarged right hilar lymph nodes, likely reactive. Reference node measuring 11 mm in short axis on series 6, image 64. Lungs/Pleura: Central airways are patent. Ground-glass opacities of the right-greater-than-left lower lobes and posterior right upper and middle lobes. Musculoskeletal: No chest wall abnormality. No acute or significant osseous findings. Review of the MIP images confirms the above findings. CT ABDOMEN and PELVIS FINDINGS Hepatobiliary: No focal liver abnormality is seen. No gallstones, gallbladder wall thickening, or biliary dilatation. Pancreas: Unremarkable. No pancreatic ductal dilatation or surrounding inflammatory changes. Spleen: Normal in size without focal abnormality. Adrenals/Urinary Tract: Adrenal glands are unremarkable. Kidneys are normal, without renal calculi, focal lesion, or hydronephrosis. Bladder wall thickening. Stomach/Bowel: Stomach is within normal limits. No evidence of bowel wall thickening, distention, or inflammatory changes. Vascular/Lymphatic: Aortic atherosclerosis. No enlarged abdominal or pelvic lymph nodes. Reproductive: Oblong cystic lesion located between the vaginal canal and rectum measuring 5.2 x 2.0 x 6.2 cm. Retroverted uterus. No adnexal mass. Other: No abdominal wall hernia or abnormality. No abdominopelvic ascites. Musculoskeletal: No acute or significant osseous findings. Review of the MIP images confirms the above findings. IMPRESSION: 1. No evidence of pulmonary embolus. 2. Ground-glass opacities of the right-greater-than-left lower lobes and posterior right upper and middle lobes, likely due to aspiration, infection could also have this appearance. 3.  Mildly enlarged right hilar lymph nodes, likely reactive. 4. Bladder wall thickening findings can be seen in the setting of cystitis, recommend correlation with urinalysis. 5. Oblong cystic lesion located between the vaginal canal and rectum. Recommend pelvic ultrasound for further evaluation. 6. Aortic Atherosclerosis (ICD10-I70.0). Electronically Signed   By: Allegra Lai M.D.   On: 03/17/2023 16:20   CT ABDOMEN PELVIS W CONTRAST  Result Date: 03/17/2023 CLINICAL DATA:  Cough abdominal pain EXAM: CT ANGIOGRAPHY CHEST CT ABDOMEN AND PELVIS WITH CONTRAST TECHNIQUE: Multidetector CT imaging of the chest was performed using the standard protocol during bolus administration of intravenous contrast. Multiplanar CT image reconstructions and MIPs were obtained to evaluate the vascular anatomy. Multidetector CT imaging of the abdomen and pelvis was  performed using the standard protocol during bolus administration of intravenous contrast. RADIATION DOSE REDUCTION: This exam was performed according to the departmental dose-optimization program which includes automated exposure control, adjustment of the mA and/or kV according to patient size and/or use of iterative reconstruction technique. CONTRAST:  OMNIPAQUE IOHEXOL 350 MG/ML SOLN COMPARISON:  None Available. FINDINGS: CTA CHEST FINDINGS Cardiovascular: No evidence of pulmonary embolus. Normal heart size. No pericardial effusion. Normal caliber thoracic aorta with mild atherosclerotic disease. Mild coronary artery calcifications. Mediastinum/Nodes: Esophagus and thyroid are unremarkable. Mildly enlarged right hilar lymph nodes, likely reactive. Reference node measuring 11 mm in short axis on series 6, image 64. Lungs/Pleura: Central airways are patent. Ground-glass opacities of the right-greater-than-left lower lobes and posterior right upper and middle lobes. Musculoskeletal: No chest wall abnormality. No acute or significant osseous findings. Review of the  MIP images confirms the above findings. CT ABDOMEN and PELVIS FINDINGS Hepatobiliary: No focal liver abnormality is seen. No gallstones, gallbladder wall thickening, or biliary dilatation. Pancreas: Unremarkable. No pancreatic ductal dilatation or surrounding inflammatory changes. Spleen: Normal in size without focal abnormality. Adrenals/Urinary Tract: Adrenal glands are unremarkable. Kidneys are normal, without renal calculi, focal lesion, or hydronephrosis. Bladder wall thickening. Stomach/Bowel: Stomach is within normal limits. No evidence of bowel wall thickening, distention, or inflammatory changes. Vascular/Lymphatic: Aortic atherosclerosis. No enlarged abdominal or pelvic lymph nodes. Reproductive: Oblong cystic lesion located between the vaginal canal and rectum measuring 5.2 x 2.0 x 6.2 cm. Retroverted uterus. No adnexal mass. Other: No abdominal wall hernia or abnormality. No abdominopelvic ascites. Musculoskeletal: No acute or significant osseous findings. Review of the MIP images confirms the above findings. IMPRESSION: 1. No evidence of pulmonary embolus. 2. Ground-glass opacities of the right-greater-than-left lower lobes and posterior right upper and middle lobes, likely due to aspiration, infection could also have this appearance. 3. Mildly enlarged right hilar lymph nodes, likely reactive. 4. Bladder wall thickening findings can be seen in the setting of cystitis, recommend correlation with urinalysis. 5. Oblong cystic lesion located between the vaginal canal and rectum. Recommend pelvic ultrasound for further evaluation. 6. Aortic Atherosclerosis (ICD10-I70.0). Electronically Signed   By: Allegra Lai M.D.   On: 03/17/2023 16:20   DG Chest 2 View  Result Date: 03/17/2023 CLINICAL DATA:  Wheezing. Technologist notes best images obtainable. Concern for pneumonia with worsening cough and fever. EXAM: CHEST - 2 VIEW COMPARISON:  Chest radiograph 11/30/2005 FINDINGS: Of note, the patient is  moderately rotated on this examination. Heart is normal in size, accounting for patient positioning. Mild diffuse interstitial coarsening. No focal consolidation, pleural effusion, or pneumothorax. Diffuse osteopenia. IMPRESSION: Mild diffuse interstitial coarsening, which may be chronic or due to mild interstitial edema. No focal consolidation. Electronically Signed   By: Sherron Ales M.D.   On: 03/17/2023 12:33    Assessment & Plan:  .Diarrhea, unspecified type -     Basic metabolic panel -     Magnesium -     CBC with Differential/Platelet  Prediabetes -     Hemoglobin A1c  Parkinson's disease with dyskinesia and fluctuating manifestations Assessment & Plan: Managed with Sinemet 5 times a day.  She has periods of alertness and normal energy followed by periods of increased lethargy    Aspiration pneumonia of right lower lobe due to gastric secretions [facility]) Assessment & Plan: Resolved clinically    Acute hypoxemic respiratory failure (HCC) Assessment & Plan: Resolved    Hypokalemia Assessment & Plan: Treated with several doses of oral potassium prior to  discharge,  repeating today with magnesium      I provided 30 minutes of face-to-face time during this encounter reviewing patient's recent hospitalization , patient's  most recent visit with  neurology,  previous  labs and imaging studies, counseling on currently addressed issues,  and post visit ordering to diagnostics and therapeutics .   Follow-up: Return in about 6 months (around 10/09/2023) for follow up diabetes.   Sherlene Shams, MD

## 2023-04-08 NOTE — Assessment & Plan Note (Signed)
Treated with several doses of oral potassium prior to discharge,  repeating today with magnesium

## 2023-04-08 NOTE — Patient Instructions (Addendum)
Rechecking lytes today to see if additional supplementation is needed  Try adding citrucel, metamucil or benefiber to help bulk up her stools

## 2023-04-08 NOTE — Assessment & Plan Note (Signed)
Managed with Sinemet 5 times a day.  She has periods of alertness and normal energy followed by periods of increased lethargy

## 2023-04-08 NOTE — Assessment & Plan Note (Signed)
Resolved

## 2023-04-09 LAB — CBC WITH DIFFERENTIAL/PLATELET
Basophils Absolute: 0.1 10*3/uL (ref 0.0–0.1)
Basophils Relative: 1.1 % (ref 0.0–3.0)
Eosinophils Absolute: 0.2 10*3/uL (ref 0.0–0.7)
Eosinophils Relative: 3.7 % (ref 0.0–5.0)
HCT: 41.8 % (ref 36.0–46.0)
Hemoglobin: 14.1 g/dL (ref 12.0–15.0)
Lymphocytes Relative: 31.5 % (ref 12.0–46.0)
Lymphs Abs: 1.8 10*3/uL (ref 0.7–4.0)
MCHC: 33.8 g/dL (ref 30.0–36.0)
MCV: 90 fl (ref 78.0–100.0)
Monocytes Absolute: 0.4 10*3/uL (ref 0.1–1.0)
Monocytes Relative: 6.7 % (ref 3.0–12.0)
Neutro Abs: 3.3 10*3/uL (ref 1.4–7.7)
Neutrophils Relative %: 57 % (ref 43.0–77.0)
Platelets: 255 10*3/uL (ref 150.0–400.0)
RBC: 4.65 Mil/uL (ref 3.87–5.11)
RDW: 15.6 % — ABNORMAL HIGH (ref 11.5–15.5)
WBC: 5.8 10*3/uL (ref 4.0–10.5)

## 2023-04-09 LAB — BASIC METABOLIC PANEL
BUN: 6 mg/dL (ref 6–23)
CO2: 29 mEq/L (ref 19–32)
Calcium: 9.6 mg/dL (ref 8.4–10.5)
Chloride: 103 mEq/L (ref 96–112)
Creatinine, Ser: 0.75 mg/dL (ref 0.40–1.20)
GFR: 76.08 mL/min (ref >60.00)
Glucose, Bld: 105 mg/dL — ABNORMAL HIGH (ref 70–99)
Potassium: 4.4 mEq/L (ref 3.5–5.1)
Sodium: 142 mEq/L (ref 135–145)

## 2023-04-09 LAB — HEMOGLOBIN A1C: Hgb A1c MFr Bld: 6.1 % (ref 4.6–6.5)

## 2023-04-09 LAB — MAGNESIUM: Magnesium: 2.3 mg/dL (ref 1.5–2.5)

## 2023-04-20 DIAGNOSIS — G3183 Dementia with Lewy bodies: Secondary | ICD-10-CM | POA: Diagnosis not present

## 2023-04-20 DIAGNOSIS — Z8673 Personal history of transient ischemic attack (TIA), and cerebral infarction without residual deficits: Secondary | ICD-10-CM | POA: Diagnosis not present

## 2023-05-07 ENCOUNTER — Emergency Department: Payer: PPO

## 2023-05-07 ENCOUNTER — Inpatient Hospital Stay
Admission: EM | Admit: 2023-05-07 | Discharge: 2023-05-12 | DRG: 522 | Disposition: A | Discharge status: Home Health | Payer: PPO | Attending: Internal Medicine | Admitting: Internal Medicine

## 2023-05-07 ENCOUNTER — Other Ambulatory Visit: Payer: Self-pay

## 2023-05-07 DIAGNOSIS — S72042A Displaced fracture of base of neck of left femur, initial encounter for closed fracture: Secondary | ICD-10-CM | POA: Diagnosis not present

## 2023-05-07 DIAGNOSIS — N390 Urinary tract infection, site not specified: Secondary | ICD-10-CM | POA: Diagnosis not present

## 2023-05-07 DIAGNOSIS — N3000 Acute cystitis without hematuria: Secondary | ICD-10-CM

## 2023-05-07 DIAGNOSIS — E785 Hyperlipidemia, unspecified: Secondary | ICD-10-CM

## 2023-05-07 DIAGNOSIS — Z7982 Long term (current) use of aspirin: Secondary | ICD-10-CM | POA: Diagnosis not present

## 2023-05-07 DIAGNOSIS — F0284 Dementia in other diseases classified elsewhere, unspecified severity, with anxiety: Secondary | ICD-10-CM | POA: Diagnosis not present

## 2023-05-07 DIAGNOSIS — B9689 Other specified bacterial agents as the cause of diseases classified elsewhere: Secondary | ICD-10-CM | POA: Diagnosis not present

## 2023-05-07 DIAGNOSIS — W19XXXA Unspecified fall, initial encounter: Secondary | ICD-10-CM | POA: Diagnosis not present

## 2023-05-07 DIAGNOSIS — Z79899 Other long term (current) drug therapy: Secondary | ICD-10-CM

## 2023-05-07 DIAGNOSIS — W010XXA Fall on same level from slipping, tripping and stumbling without subsequent striking against object, initial encounter: Secondary | ICD-10-CM | POA: Diagnosis present

## 2023-05-07 DIAGNOSIS — B964 Proteus (mirabilis) (morganii) as the cause of diseases classified elsewhere: Secondary | ICD-10-CM | POA: Diagnosis present

## 2023-05-07 DIAGNOSIS — Z9841 Cataract extraction status, right eye: Secondary | ICD-10-CM

## 2023-05-07 DIAGNOSIS — S72002A Fracture of unspecified part of neck of left femur, initial encounter for closed fracture: Secondary | ICD-10-CM | POA: Diagnosis not present

## 2023-05-07 DIAGNOSIS — Z9842 Cataract extraction status, left eye: Secondary | ICD-10-CM

## 2023-05-07 DIAGNOSIS — G20A1 Parkinson's disease without dyskinesia, without mention of fluctuations: Secondary | ICD-10-CM

## 2023-05-07 DIAGNOSIS — F02818 Dementia in other diseases classified elsewhere, unspecified severity, with other behavioral disturbance: Secondary | ICD-10-CM | POA: Diagnosis present

## 2023-05-07 DIAGNOSIS — Z961 Presence of intraocular lens: Secondary | ICD-10-CM | POA: Diagnosis not present

## 2023-05-07 DIAGNOSIS — R0902 Hypoxemia: Secondary | ICD-10-CM | POA: Diagnosis not present

## 2023-05-07 DIAGNOSIS — X58XXXA Exposure to other specified factors, initial encounter: Secondary | ICD-10-CM | POA: Diagnosis not present

## 2023-05-07 DIAGNOSIS — F0280 Dementia in other diseases classified elsewhere without behavioral disturbance: Secondary | ICD-10-CM

## 2023-05-07 DIAGNOSIS — Z01818 Encounter for other preprocedural examination: Secondary | ICD-10-CM

## 2023-05-07 DIAGNOSIS — Y92009 Unspecified place in unspecified non-institutional (private) residence as the place of occurrence of the external cause: Secondary | ICD-10-CM

## 2023-05-07 DIAGNOSIS — Z820 Family history of epilepsy and other diseases of the nervous system: Secondary | ICD-10-CM | POA: Diagnosis not present

## 2023-05-07 DIAGNOSIS — G2581 Restless legs syndrome: Secondary | ICD-10-CM | POA: Diagnosis present

## 2023-05-07 DIAGNOSIS — F513 Sleepwalking [somnambulism]: Secondary | ICD-10-CM | POA: Diagnosis present

## 2023-05-07 DIAGNOSIS — G3183 Dementia with Lewy bodies: Secondary | ICD-10-CM | POA: Diagnosis not present

## 2023-05-07 DIAGNOSIS — Z8249 Family history of ischemic heart disease and other diseases of the circulatory system: Secondary | ICD-10-CM | POA: Diagnosis not present

## 2023-05-07 DIAGNOSIS — Z888 Allergy status to other drugs, medicaments and biological substances status: Secondary | ICD-10-CM

## 2023-05-07 DIAGNOSIS — F419 Anxiety disorder, unspecified: Secondary | ICD-10-CM

## 2023-05-07 DIAGNOSIS — R519 Headache, unspecified: Secondary | ICD-10-CM | POA: Diagnosis not present

## 2023-05-07 DIAGNOSIS — S72001A Fracture of unspecified part of neck of right femur, initial encounter for closed fracture: Secondary | ICD-10-CM | POA: Insufficient documentation

## 2023-05-07 DIAGNOSIS — R4189 Other symptoms and signs involving cognitive functions and awareness: Secondary | ICD-10-CM | POA: Diagnosis present

## 2023-05-07 DIAGNOSIS — Z833 Family history of diabetes mellitus: Secondary | ICD-10-CM

## 2023-05-07 DIAGNOSIS — Z803 Family history of malignant neoplasm of breast: Secondary | ICD-10-CM

## 2023-05-07 DIAGNOSIS — R609 Edema, unspecified: Secondary | ICD-10-CM | POA: Diagnosis not present

## 2023-05-07 DIAGNOSIS — M4312 Spondylolisthesis, cervical region: Secondary | ICD-10-CM | POA: Diagnosis not present

## 2023-05-07 DIAGNOSIS — I10 Essential (primary) hypertension: Secondary | ICD-10-CM | POA: Diagnosis not present

## 2023-05-07 DIAGNOSIS — Z8673 Personal history of transient ischemic attack (TIA), and cerebral infarction without residual deficits: Secondary | ICD-10-CM | POA: Diagnosis not present

## 2023-05-07 DIAGNOSIS — F0283 Dementia in other diseases classified elsewhere, unspecified severity, with mood disturbance: Secondary | ICD-10-CM | POA: Diagnosis present

## 2023-05-07 DIAGNOSIS — Z471 Aftercare following joint replacement surgery: Secondary | ICD-10-CM | POA: Diagnosis not present

## 2023-05-07 DIAGNOSIS — E7800 Pure hypercholesterolemia, unspecified: Secondary | ICD-10-CM | POA: Diagnosis present

## 2023-05-07 DIAGNOSIS — G20B2 Parkinson's disease with dyskinesia, with fluctuations: Secondary | ICD-10-CM | POA: Diagnosis not present

## 2023-05-07 DIAGNOSIS — B961 Klebsiella pneumoniae [K. pneumoniae] as the cause of diseases classified elsewhere: Secondary | ICD-10-CM | POA: Diagnosis present

## 2023-05-07 DIAGNOSIS — Z743 Need for continuous supervision: Secondary | ICD-10-CM | POA: Diagnosis not present

## 2023-05-07 DIAGNOSIS — F02B2 Dementia in other diseases classified elsewhere, moderate, with psychotic disturbance: Secondary | ICD-10-CM | POA: Diagnosis not present

## 2023-05-07 DIAGNOSIS — R079 Chest pain, unspecified: Secondary | ICD-10-CM | POA: Diagnosis not present

## 2023-05-07 DIAGNOSIS — Z96642 Presence of left artificial hip joint: Secondary | ICD-10-CM | POA: Diagnosis not present

## 2023-05-07 DIAGNOSIS — S72012A Unspecified intracapsular fracture of left femur, initial encounter for closed fracture: Secondary | ICD-10-CM | POA: Diagnosis not present

## 2023-05-07 DIAGNOSIS — M542 Cervicalgia: Secondary | ICD-10-CM | POA: Diagnosis not present

## 2023-05-07 LAB — URINALYSIS, COMPLETE (UACMP) WITH MICROSCOPIC
Bilirubin Urine: NEGATIVE
Glucose, UA: NEGATIVE mg/dL
Hgb urine dipstick: NEGATIVE
Ketones, ur: 5 mg/dL — AB
Nitrite: NEGATIVE
Protein, ur: NEGATIVE mg/dL
Specific Gravity, Urine: 1.016 (ref 1.005–1.030)
WBC, UA: >50 WBC/hpf (ref 0–5)
pH: 6 (ref 5.0–8.0)

## 2023-05-07 LAB — CBC WITH DIFFERENTIAL/PLATELET
Abs Immature Granulocytes: 0.03 10*3/uL (ref 0.00–0.07)
Basophils Absolute: 0 10*3/uL (ref 0.0–0.1)
Basophils Relative: 0 %
Eosinophils Absolute: 0.2 10*3/uL (ref 0.0–0.5)
Eosinophils Relative: 2 %
HCT: 39 % (ref 36.0–46.0)
Hemoglobin: 12.5 g/dL (ref 12.0–15.0)
Immature Granulocytes: 0 %
Lymphocytes Relative: 24 %
Lymphs Abs: 2.3 10*3/uL (ref 0.7–4.0)
MCH: 29.9 pg (ref 26.0–34.0)
MCHC: 32.1 g/dL (ref 30.0–36.0)
MCV: 93.3 fL (ref 80.0–100.0)
Monocytes Absolute: 0.6 10*3/uL (ref 0.1–1.0)
Monocytes Relative: 6 %
Neutro Abs: 6.4 10*3/uL (ref 1.7–7.7)
Neutrophils Relative %: 68 %
Platelets: 213 10*3/uL (ref 150–400)
RBC: 4.18 MIL/uL (ref 3.87–5.11)
RDW: 14.6 % (ref 11.5–15.5)
WBC: 9.6 10*3/uL (ref 4.0–10.5)
nRBC: 0 % (ref 0.0–0.2)

## 2023-05-07 LAB — COMPREHENSIVE METABOLIC PANEL
ALT: 7 U/L (ref 0–44)
AST: 24 U/L (ref 15–41)
Albumin: 3.6 g/dL (ref 3.5–5.0)
Alkaline Phosphatase: 63 U/L (ref 38–126)
Anion gap: 11 (ref 5–15)
BUN: 10 mg/dL (ref 8–23)
CO2: 23 mmol/L (ref 22–32)
Calcium: 9 mg/dL (ref 8.9–10.3)
Chloride: 106 mmol/L (ref 98–111)
Creatinine, Ser: 0.64 mg/dL (ref 0.44–1.00)
GFR, Estimated: >60 mL/min (ref >60)
Glucose, Bld: 113 mg/dL — ABNORMAL HIGH (ref 70–99)
Potassium: 3.7 mmol/L (ref 3.5–5.1)
Sodium: 140 mmol/L (ref 135–145)
Total Bilirubin: 0.6 mg/dL (ref 0.3–1.2)
Total Protein: 6.7 g/dL (ref 6.5–8.1)

## 2023-05-07 LAB — TYPE AND SCREEN
ABO/RH(D): O POS
Antibody Screen: NEGATIVE

## 2023-05-07 MED ORDER — MORPHINE SULFATE (PF) 2 MG/ML IV SOLN
2.0000 mg | INTRAVENOUS | Status: DC | PRN
  Administered 2023-05-07 (×2): 2 mg via INTRAVENOUS
  Filled 2023-05-07 (×3): qty 1

## 2023-05-07 MED ORDER — ACETAMINOPHEN 650 MG RE SUPP: 650.0000 mg | Freq: Four times a day (QID) | RECTAL | Status: DC | PRN

## 2023-05-07 MED ORDER — DULOXETINE HCL 30 MG PO CPEP
60.0000 mg | ORAL_CAPSULE | Freq: Every day | ORAL | Status: DC
  Administered 2023-05-08 – 2023-05-12 (×5): 60 mg via ORAL
  Filled 2023-05-07 (×4): qty 2
  Filled 2023-05-07: qty 1
  Filled 2023-05-07: qty 2

## 2023-05-07 MED ORDER — QUETIAPINE FUMARATE 25 MG PO TABS
12.5000 mg | ORAL_TABLET | Freq: Every day | ORAL | Status: DC
  Administered 2023-05-07 – 2023-05-11 (×5): 12.5 mg via ORAL
  Filled 2023-05-07 (×5): qty 1

## 2023-05-07 MED ORDER — ONDANSETRON HCL 4 MG/2ML IJ SOLN: 4.0000 mg | Freq: Four times a day (QID) | INTRAMUSCULAR | Status: DC | PRN

## 2023-05-07 MED ORDER — PREGABALIN 75 MG PO CAPS
150.0000 mg | ORAL_CAPSULE | Freq: Two times a day (BID) | ORAL | Status: DC
  Administered 2023-05-07 – 2023-05-12 (×9): 150 mg via ORAL
  Filled 2023-05-07 (×10): qty 2

## 2023-05-07 MED ORDER — LAMOTRIGINE 25 MG PO TABS
100.0000 mg | ORAL_TABLET | Freq: Two times a day (BID) | ORAL | Status: DC
  Administered 2023-05-07 – 2023-05-12 (×9): 100 mg via ORAL
  Filled 2023-05-07: qty 1
  Filled 2023-05-07: qty 4
  Filled 2023-05-07: qty 1
  Filled 2023-05-07 (×8): qty 4

## 2023-05-07 MED ORDER — FENTANYL CITRATE PF 50 MCG/ML IJ SOSY
50.0000 ug | PREFILLED_SYRINGE | INTRAMUSCULAR | Status: DC | PRN
  Administered 2023-05-07 (×3): 50 ug via INTRAVENOUS
  Filled 2023-05-07 (×4): qty 1

## 2023-05-07 MED ORDER — SERTRALINE HCL 50 MG PO TABS
200.0000 mg | ORAL_TABLET | Freq: Every day | ORAL | Status: DC
  Administered 2023-05-08 – 2023-05-12 (×5): 200 mg via ORAL
  Filled 2023-05-07 (×6): qty 4

## 2023-05-07 MED ORDER — HEPARIN SODIUM (PORCINE) 5000 UNIT/ML IJ SOLN
5000.0000 [IU] | Freq: Three times a day (TID) | INTRAMUSCULAR | Status: AC
  Administered 2023-05-07 (×2): 5000 [IU] via SUBCUTANEOUS
  Filled 2023-05-07 (×2): qty 1

## 2023-05-07 MED ORDER — HYDROXYZINE HCL 25 MG PO TABS
25.0000 mg | ORAL_TABLET | Freq: Two times a day (BID) | ORAL | Status: DC | PRN
  Filled 2023-05-07: qty 1

## 2023-05-07 MED ORDER — ONDANSETRON HCL 4 MG PO TABS: 4.0000 mg | ORAL_TABLET | Freq: Four times a day (QID) | ORAL | Status: DC | PRN

## 2023-05-07 MED ORDER — ACETAMINOPHEN 325 MG PO TABS: 650.0000 mg | ORAL_TABLET | Freq: Four times a day (QID) | ORAL | Status: DC | PRN

## 2023-05-07 MED ORDER — CARBIDOPA-LEVODOPA ER 50-200 MG PO TBCR
1.0000 | EXTENDED_RELEASE_TABLET | Freq: Every day | ORAL | Status: DC
  Administered 2023-05-07 – 2023-05-12 (×22): 1 via ORAL
  Filled 2023-05-07 (×26): qty 1

## 2023-05-07 NOTE — ED Notes (Signed)
Patient to CT at this time

## 2023-05-07 NOTE — ED Provider Notes (Signed)
[facility] Provider Note    Event Date/Time   First MD Initiated Contact with Patient 05/07/23 1049     (approximate)   History   Fall   HPI  Deborah Velazquez is a 77 y.o. female with a history of Parkinson's presents to the ER for evaluation of acute left hip pain that occurred after she was sleepwalking last night had mechanical fall against some furniture.  Does not recall LOC.  Has not been able to ambulate due to pain.     Physical Exam   Triage Vital Signs: ED Triage Vitals  Enc Vitals Group     BP 05/07/23 1046 138/87     Pulse Rate 05/07/23 1046 77     Resp 05/07/23 1046 18     Temp 05/07/23 1046 98.5 F (36.9 C)     Temp Source 05/07/23 1046 Oral     SpO2 05/07/23 1046 93 %     Weight 05/07/23 1045 156 lb 15.5 oz (71.2 kg)     Height --      Head Circumference --      Peak Flow --      Pain Score 05/07/23 1044 10     Pain Loc --      Pain Edu? --      Excl. in GC? --     Most recent vital signs: Vitals:   05/07/23 1046 05/07/23 1204  BP: 138/87 (!) 152/97  Pulse: 77 73  Resp: 18 18  Temp: 98.5 F (36.9 C)   SpO2: 93% 90%     Constitutional: Alert  Eyes: Conjunctivae are normal.  Head: Atraumatic. Nose: No congestion/rhinnorhea. Mouth/Throat: Mucous membranes are moist.   Neck: Painless ROM.  Cardiovascular:   Good peripheral circulation. Respiratory: Normal respiratory effort.  No retractions.  Gastrointestinal: Soft and nontender.  Musculoskeletal: Left hip is tender to palpation with logroll.  Left leg is slightly shortened as compared to the right.  Neurovascular intact distally.  Compartments are soft. Neurologic:  MAE spontaneously. No gross focal neurologic deficits are appreciated.  Skin:  Skin is warm, dry and intact. No rash noted. Psychiatric: Mood and affect are normal. Speech and behavior are normal.    ED Results / Procedures / Treatments   Labs (all labs ordered are listed, but only abnormal  results are displayed) Labs Reviewed  COMPREHENSIVE METABOLIC PANEL - Abnormal; Notable for the following components:      Result Value   Glucose, Bld 113 (*)    All other components within normal limits  CBC WITH DIFFERENTIAL/PLATELET     EKG  ED ECG REPORT I, Willy Eddy, the attending physician, personally viewed and interpreted this ECG.   Date: 05/07/2023  EKG Time: 11:40  Rate: 70  Rhythm: sinus  Axis: normal  Intervals: normal  ST&T Change: no stemi, no depressions    RADIOLOGY Please see ED Course for my review and interpretation.  I personally reviewed all radiographic images ordered to evaluate for the above acute complaints and reviewed radiology reports and findings.  These findings were personally discussed with the patient.  Please see medical record for radiology report.    PROCEDURES:  Critical Care performed:   Procedures   MEDICATIONS ORDERED IN ED: Medications  fentaNYL (SUBLIMAZE) injection 50 mcg (50 mcg Intravenous Given 05/07/23 1144)     IMPRESSION / MDM / ASSESSMENT AND PLAN / ED COURSE  I reviewed the triage vital signs and the nursing notes.  Differential diagnosis includes, but is not limited to, fracture, dislocation, contusion, SDH, IPH  Patient presenting to the ER for evaluation of symptoms as described above.  Based on symptoms, risk factors and considered above differential, this presenting complaint could reflect a potentially life-threatening illness therefore the patient will be placed on continuous pulse oximetry and telemetry for monitoring.  Laboratory evaluation will be sent to evaluate for the above complaints.      Clinical Course as of 05/07/23 1208  Wed May 07, 2023  1127 CT head on my review and interpretation without evidence of contusion or bleed. [PR]  1139 X-ray left hip on my interpretation with evidence of a left femoral neck fracture. [PR]  1205 Discussed in consultation  with Dr. Audelia Acton of orthopedics.  Have consulted hospitalist for admission. [PR]    Clinical Course User Index [PR] Willy Eddy, MD     FINAL CLINICAL IMPRESSION(S) / ED DIAGNOSES   Final diagnoses:  Closed fracture of neck of left femur, initial encounter [facility])     Rx / DC Orders   ED Discharge Orders     None        Note:  This document was prepared using Dragon voice recognition software and may include unintentional dictation errors.    Willy Eddy, MD 05/07/23 1208

## 2023-05-07 NOTE — H&P (Signed)
History and Physical    Deborah Velazquez:[id number] DOB: 18-Jun-1946 DOA: 05/07/2023  PCP: Sherlene Shams, MD Patient coming from: home  Chief Complaint: left hip pain  HPI: Deborah Velazquez is a 77 y.o. female with medical history significant of Parkinson's disease using walker to ambulate, Lewy body dementia, HLD, IBS,, TIA, CVA, anxiety, who presented with left hip pain status fall POA.  Patient lives at home with full-time home caregivers 24/7.  She reports that she was sleepwalking with her walker last night, lost her balance and fell onto the carpeted floor in her bedroom.  She immediately felt left hip pain and was able to be assisted back to bed by her caregivers.  EMS was eventually called and she was brought to the ED for further evaluation.  In the ED, she was hemodynamically stable.  Labs showed nonrevealing CBC and CMP.  CT head and cervical spine showed no acute change.  Hip x-ray showed left hip fracture. CXR and cervical spine CT showed Unresolved apical lung opacity from an April chest CTA.  Orthopedic surgery were called by ED provider.  Denies fevers, chills, shortness of breath, cough, wheezing, chest pain, palpitation, nausea, vomiting, diarrhea, abdomen pain, dysuria, urine frequency or urgency.   Review of Systems: As per HPI otherwise 10 point review of systems negative.  Review of Systems Otherwise negative except as per HPI, including: General: Denies fever, chills, night sweats or unintended weight loss. Resp: Denies cough, wheezing, shortness of breath. Cardiac: Denies chest pain, palpitations, orthopnea, paroxysmal nocturnal dyspnea. GI: Denies abdominal pain, nausea, vomiting, diarrhea or constipation GU: Denies dysuria, frequency, hesitancy or incontinence MS: Positive for left hip pain status post a fall POA. Neuro: Denies headache, neurologic deficits (focal weakness, numbness, tingling), abnormal gait Psych: Denies anxiety, depression, SI/HI/AVH Skin:  Denies new rashes or lesions ID: Denies sick contacts, exotic exposures, travel  Past Medical History:  Diagnosis Date   Anxiety    Arthritis    Hypercholesterolemia    Hyperlipemia    Irritable bowel syndrome    Parkinson disease    Pneumonia due to human metapneumovirus 03/18/2023   Restless leg syndrome    RSD (reflex sympathetic dystrophy)    pt denies   Stroke [facility])    TIA's    Past Surgical History:  Procedure Laterality Date   APPENDECTOMY     BACK SURGERY     BREAST BIOPSY Left 08/16/2013   neg   CATARACT EXTRACTION Left    CATARACT EXTRACTION W/PHACO Right 06/01/2020   Procedure: CATARACT EXTRACTION PHACO AND INTRAOCULAR LENS PLACEMENT (IOC) RIGHT;  Surgeon: Lockie Mola, MD;  Location: [facility] ORS;  Service: Ophthalmology;  Laterality: Right;  cde5.26 us01:05.7 ap8.0   COLONOSCOPY     COLONOSCOPY WITH PROPOFOL N/A 06/20/2015   Procedure: COLONOSCOPY WITH PROPOFOL;  Surgeon: Christena Deem, MD;  Location: [facility] ENDOSCOPY;  Service: Endoscopy;  Laterality: N/A;   EYE SURGERY     Cataract in one eye   FRACTURE SURGERY     wrist - right and right leg. (? if any metal)   PARS PLANA VITRECTOMY Left 2010    SOCIAL HISTORY:  reports that she has never smoked. She has never used smokeless tobacco. She reports current alcohol use. She reports that she does not use drugs.  Allergies  Allergen Reactions   Pseudoephedrine Hcl Anxiety and Other (See Comments)    Nervous   Sudafed [Pseudoephedrine] Anxiety and Other (See Comments)    Nervous    FAMILY  HISTORY: Family History  Problem Relation Age of Onset   Breast cancer Mother 58   Diabetes Mother    Cancer Mother    Heart disease Mother    Alzheimer's disease Father    Heart disease Maternal Aunt      Prior to Admission medications   Medication Sig Start Date End Date Taking? Authorizing Provider  aspirin EC 81 MG tablet Take 81 mg by mouth daily. Swallow whole.    [provider]   atorvastatin (LIPITOR) 10 MG tablet Take 1 tablet (10 mg total) by mouth at bedtime. 05/07/22   Sherlene Shams, MD  carbidopa-levodopa (SINEMET CR) 50-200 MG tablet Take 1 tablet by mouth 5 (five) times daily. Patient takes at 0700,1030,1400,1730,2200 12/30/19   [provider]  DULoxetine (CYMBALTA) 60 MG capsule Take 60 mg by mouth daily.     [provider]  estradiol (ESTRACE) 0.1 MG/GM vaginal cream INSERT 1 APPLICATORFUL VAGINALLY 2 TIMES A WEEK AS DIRECTED 10/14/22   Sherlene Shams, MD  guaiFENesin-dextromethorphan (ROBITUSSIN DM) 100-10 MG/5ML syrup Take 5 mLs by mouth every 4 (four) hours as needed for cough. 03/20/23   Alford Highland, MD  hydrOXYzine (ATARAX/VISTARIL) 25 MG tablet Take 25 mg by mouth 2 (two) times daily as needed. 07/06/20   [provider]  lamoTRIgine (LAMICTAL) 100 MG tablet Take 100 mg by mouth 2 (two) times daily. 11/12/21   [provider]  nystatin (MYCOSTATIN/NYSTOP) powder Apply 1 application. topically 2 (two) times daily. To rash until resolved. 02/26/22   Sherlene Shams, MD  OVER THE COUNTER MEDICATION Take 1 capsule by mouth in the morning and at bedtime. Doterra Life Long Vitality Pack    [provider]  pregabalin (LYRICA) 150 MG capsule Take 150 mg by mouth 2 (two) times daily.  06/10/19   [provider]  QUEtiapine (SEROQUEL) 25 MG tablet Take 0.5 tablets (12.5 mg total) by mouth at bedtime. 03/20/23   Alford Highland, MD  sertraline (ZOLOFT) 100 MG tablet Take 200 mg by mouth daily.     [provider]    Physical Exam: Vitals:   05/07/23 1045 05/07/23 1046 05/07/23 1204 05/07/23 1401  BP:  138/87 (!) 152/97 139/88  Pulse:  77 73 73  Resp:  18 18 18   Temp:  98.5 F (36.9 C)    TempSrc:  Oral    SpO2:  93% 90% 92%  Weight: 71.2 kg     Height:    5\' 5"  (1.651 m)      Constitutional: NAD, calm, comfortable.  Acutely ill-appearing. Eyes: PERRL, lids and conjunctivae normal ENMT:  Mucous membranes are moist. Posterior pharynx clear of any exudate or lesions.Normal dentition.  Neck: normal, supple, no masses, no thyromegaly Respiratory: clear to auscultation bilaterally, no wheezing, no crackles. Normal respiratory effort. No accessory muscle use.  Cardiovascular: Regular rate and rhythm, no murmurs / rubs / gallops. No extremity edema. 2+ pedal pulses. No carotid bruits.  Abdomen: no tenderness, no masses palpated. No hepatosplenomegaly. Bowel sounds positive.  Musculoskeletal: no clubbing / cyanosis.  Left hip TTP.   Skin: no rashes, lesions, ulcers. No induration Neurologic: CN 2-12 grossly intact. Sensation intact, DTR normal. Strength 5/5 in all 4.  Psychiatric: Normal judgment and insight. Alert and oriented x 3. Normal mood.     Labs on Admission: I have personally reviewed following labs and imaging studies  CBC: Recent Labs  Lab 05/07/23 1049  WBC 9.6  NEUTROABS 6.4  HGB 12.5  HCT 39.0  MCV 93.3  PLT 213   Basic Metabolic Panel: Recent Labs  Lab 05/07/23 1049  Zachary Lovins 140  K 3.7  CL 106  CO2 23  GLUCOSE 113*  BUN 10  CREATININE 0.64  CALCIUM 9.0   GFR: Estimated Creatinine Clearance: 57.4 mL/min (by C-G formula based on SCr of 0.64 mg/dL). Liver Function Tests: Recent Labs  Lab 05/07/23 1049  AST 24  ALT 7  ALKPHOS 63  BILITOT 0.6  PROT 6.7  ALBUMIN 3.6   No results for input(s): "LIPASE", "AMYLASE" in the last 168 hours. No results for input(s): "AMMONIA" in the last 168 hours. Coagulation Profile: No results for input(s): "INR", "PROTIME" in the last 168 hours. Cardiac Enzymes: No results for input(s): "CKTOTAL", "CKMB", "CKMBINDEX", "TROPONINI" in the last 168 hours. BNP (last 3 results) No results for input(s): "PROBNP" in the last 8760 hours. HbA1C: No results for input(s): "HGBA1C" in the last 72 hours. CBG: No results for input(s): "GLUCAP" in the last 168 hours. Lipid Profile: No results for input(s): "CHOL", "HDL",  "LDLCALC", "TRIG", "CHOLHDL", "LDLDIRECT" in the last 72 hours. Thyroid Function Tests: No results for input(s): "TSH", "T4TOTAL", "FREET4", "T3FREE", "THYROIDAB" in the last 72 hours. Anemia Panel: No results for input(s): "VITAMINB12", "FOLATE", "FERRITIN", "TIBC", "IRON", "RETICCTPCT" in the last 72 hours. Urine analysis:    Component Value Date/Time   COLORURINE YELLOW (A) 03/17/2023 1629   APPEARANCEUR CLOUDY (A) 03/17/2023 1629   LABSPEC >1.046 (H) 03/17/2023 1629   PHURINE 6.0 03/17/2023 1629   GLUCOSEU NEGATIVE 03/17/2023 1629   GLUCOSEU NEGATIVE 01/04/2021 1117   HGBUR NEGATIVE 03/17/2023 1629   BILIRUBINUR NEGATIVE 03/17/2023 1629   BILIRUBINUR negative 10/07/2019 1056   KETONESUR 5 (A) 03/17/2023 1629   PROTEINUR 30 (A) 03/17/2023 1629   UROBILINOGEN 0.2 01/04/2021 1117   NITRITE NEGATIVE 03/17/2023 1629   LEUKOCYTESUR LARGE (A) 03/17/2023 1629   Sepsis Labs: !!!!!!!!!!!!!!!!!!!!!!!!!!!!!!!!!!!!!!!!!!!! @LABRCNTIP (procalcitonin:4,lacticidven:4) )No results found for this or any previous visit (from the past 240 hour(s)).   Radiological Exams on Admission: DG FEMUR MIN 2 VIEWS LEFT  Result Date: 05/07/2023 CLINICAL DATA:  97979 Hip fracture (HCC) 97979 EXAM: LEFT FEMUR 2 VIEWS COMPARISON:  Same day hip radiograph FINDINGS: Redemonstrated displaced and angulated subcapital femoral neck fracture. Alignment grossly unchanged compared to same day prior. Normal alignment of the knee joint tibial plateau is normal in appearance. Osteopenia. No radiographically discernible soft tissue abnormality. Lumbar spinal fusion hardware is partially visualized. Assessment of the sacrum is limited due to overlying bowel gas. IMPRESSION: Redemonstrated displaced and angulated subcapital femoral neck fracture. Electronically Signed   By: Lorenza Cambridge M.D.   On: 05/07/2023 13:03   DG Chest Portable 1 View  Result Date: 05/07/2023 CLINICAL DATA:  Pain after fall EXAM: PORTABLE CHEST 1 VIEW  COMPARISON:  X-ray 03/17/2023 FINDINGS: Underinflation. Developing right upper lobe opacity. Possible infiltrate. No pneumothorax, effusion or edema. Film is rotated to the right. Enlarged cardiopericardial silhouette with tortuous and ectatic aorta. Degenerative changes of the spine. Osteopenia IMPRESSION: Underinflated and rotated radiograph. Enlarged heart with tortuous ectatic aorta. Developing right upper lobe opacity. Atelectasis versus infiltrate. Recommend follow-up Electronically Signed   By: Karen Kays M.D.   On: 05/07/2023 12:13   DG Hip Unilat W or Wo Pelvis 2-3 Views Left  Result Date: 05/07/2023 CLINICAL DATA:  Pain after fall EXAM: DG HIP (WITH OR WITHOUT PELVIS) 3V LEFT COMPARISON:  None Available. FINDINGS: Osteopenia. There is a subcapital left femoral neck fracture with overriding of  fracture fragments and angulation best depicted on the lateral view. Severe osteopenia. No additional fracture or dislocation. Osteophyte formation along both sacroiliac joints. Fixation hardware along the lower lumbar spine. Overlapping cardiac leads. There some well rounded densities in the pelvis which are indeterminate although possibly vascular. Pelvic view is rotated to the right. Hyperostosis. IMPRESSION: Displaced and angulated subcapital left femoral neck fracture. Osteopenia with degenerative changes. Electronically Signed   By: Karen Kays M.D.   On: 05/07/2023 12:12   CT Cervical Spine Wo Contrast  Result Date: 05/07/2023 CLINICAL DATA:  77 year old female status post fall last night. Pain. EXAM: CT CERVICAL SPINE WITHOUT CONTRAST TECHNIQUE: Multidetector CT imaging of the cervical spine was performed without intravenous contrast. Multiplanar CT image reconstructions were also generated. RADIATION DOSE REDUCTION: This exam was performed according to the departmental dose-optimization program which includes automated exposure control, adjustment of the mA and/or kV according to patient size  and/or use of iterative reconstruction technique. COMPARISON:  Head CT today. FINDINGS: Alignment: Cervicothoracic junction alignment is within normal limits. Bilateral posterior element alignment is within normal limits. Mild degenerative appearing anterolisthesis of C4 on C5, with chronic facet degeneration there greater on the left. Mild straightening of lordosis otherwise. Skull base and vertebrae: Some generalized osteopenia. Visualized skull base is intact. No atlanto-occipital dissociation. C1 and C2 appear intact and aligned. No acute osseous abnormality identified. Soft tissues and spinal canal: No prevertebral fluid or swelling. No visible canal hematoma. Negative visible noncontrast neck soft tissues. Disc levels: Degenerative appearing facet ankylosis on the left at C5-C6. Adjacent segment spondylolisthesis and facet arthropathy at C4-C5. Advanced disc and endplate degeneration also at the adjacent C6-C7 level. Up to mild associated cervical spinal stenosis. Upper chest: Osteopenia. Grossly intact visible upper thoracic levels. Nonspecific patchy ground-glass opacity in the upper lungs, somewhat greater on the right and associated with pleural thickening and/or scarring there. But this is stable compared to prior CTA 03/17/2023 (please see that report) IMPRESSION: 1. No acute traumatic injury identified in the cervical spine. 2. Osteopenia, and advanced cervical spine degeneration surrounding degenerative appearing ankylosis of C5-C6. 3. Unresolved apical lung opacity from an April chest CTA, thought to be infectious or inflammatory at that time. Electronically Signed   By: Odessa Fleming M.D.   On: 05/07/2023 11:44   CT HEAD WO CONTRAST ( )  Result Date: 05/07/2023 CLINICAL DATA:  77 year old female status post fall last night. Hip pain. EXAM: CT HEAD WITHOUT CONTRAST TECHNIQUE: Contiguous axial images were obtained from the base of the skull through the vertex without intravenous contrast. RADIATION  DOSE REDUCTION: This exam was performed according to the departmental dose-optimization program which includes automated exposure control, adjustment of the mA and/or kV according to patient size and/or use of iterative reconstruction technique. COMPARISON:  Brain MRI 10/01/2021 and head CT 03/17/2023. FINDINGS: Brain: Chronic cerebral volume loss with possibly ex vacuo enlargement of the lateral ventricles does not appear significantly changed from the 2022 MRI. No midline shift, mass effect, evidence of mass lesion, intracranial hemorrhage or evidence of cortically based acute infarction. Moderate, Patchy and confluent white matter hypodensity with bilateral deep gray matter nuclei heterogeneity appears stable from the previous MRI. Vascular: Calcified atherosclerosis at the skull base. No suspicious intracranial vascular hyperdensity. Skull: No acute osseous abnormality identified. Sinuses/Orbits: Visualized paranasal sinuses and mastoids are clear. Other: No acute orbit or scalp soft tissue injury identified. IMPRESSION: 1. No acute traumatic injury identified. 2. Chronic ventriculomegaly and chronic small vessel disease appear not significantly changed  from a 2022 MRI. Electronically Signed   By: Odessa Fleming M.D.   On: 05/07/2023 11:32     All images have been reviewed by me personally.  EKG: Independently reviewed.   Assessment/Plan Active Problems:   Anxiety   Cognitive impairment   Parkinson's disease   Pure hypercholesterolemia   Preoperative clearance   Lewy body dementia (HCC)   History of TIA (transient ischemic attack)   Closed fracture of neck of left femur (HCC)     Assessment and Plan: No notes have been filed under this hospital service. Service: Hospitalist  Assessment Plan  # Left hip fracture s/p fall POA  -Defer to orthopedic surgeon who plan for surgery in a.m.   # Preop clearance RCRI class II low risk. Periop cardiac risk is low. However, given her advanced age, she is  at low to moderate risk.   She denies CP, SOB or syncope. Euvolemic on exam. EKG showed NSR with no ischemic change. Recs to proceed with surgery with no additional testing      # Anxiety -Stable  # CVA/TIA -Chronic and stable  # HLD -Stable  # History of Parkinson disease and cognitive impairment # History of Lewy body dementia  -Chronic and stable.  At baseline.        Body mass index is 26.12 kg/m.      DVT prophylaxis: Heparin 5000 u TID x 2 doses ( today ) and SCD Code Status: Full code Family Communication: HPOA wise, [name] ( friend) Consults called: ortho by ED provider Admission status: inpt  Status is: Inpatient Remains inpatient appropriate because:    Time Spent: 65 minutes.  >50% of the time was devoted to discussing the patients care, assessment, plan and disposition with other care givers along with counseling the patient about the risks and benefits of treatment.    Dede Query MD [facility]  If 7PM-7AM, please contact night-coverage   05/07/2023, 2:26 PM

## 2023-05-07 NOTE — ED Triage Notes (Addendum)
Pt presents to the ED due to a fall. Per EMS pt had a fall last night might've been "dream walking". Pt is c/o L hip. Per EMS shortening noted. Pt NAD at the moment. Pt hx dementia.

## 2023-05-07 NOTE — ED Notes (Signed)
Patient's O2 dropped to 87% on RA after receiving pain medication. Patient placed on 2L [place] at this time. O2 now 92%.

## 2023-05-07 NOTE — Consult Note (Signed)
ORTHOPAEDIC CONSULTATION  REQUESTING PHYSICIAN: Willy Eddy, MD  Chief Complaint:   Left femoral neck fracture  History of Present Illness: Deborah Velazquez is a 77 y.o. female with a history of Parkinson's disease who ambulates primarily with a walker and has full-time around-the-clock care at home presented to the emergency room after having a fall while sleepwalking last night.  She does not recall any loss of consciousness.  Patient is unable to ambulate due to pain after the fall and was brought to emergency room for evaluation.  She denies any pre-existing hip pain.  She is accompanied by her friend Odella Aquas who assists her with decision-making and does all of her legal signing as her power of attorney.  She denies any shortness of breath, chest pain, nausea, vomiting, or pain anywhere else in her body at this time.  Past Medical History:  Diagnosis Date   Anxiety    Arthritis    Hypercholesterolemia    Hyperlipemia    Irritable bowel syndrome    Parkinson disease    Pneumonia due to human metapneumovirus 03/18/2023   Restless leg syndrome    RSD (reflex sympathetic dystrophy)    pt denies   Stroke [facility])    TIA's   Past Surgical History:  Procedure Laterality Date   APPENDECTOMY     BACK SURGERY     BREAST BIOPSY Left 08/16/2013   neg   CATARACT EXTRACTION Left    CATARACT EXTRACTION W/PHACO Right 06/01/2020   Procedure: CATARACT EXTRACTION PHACO AND INTRAOCULAR LENS PLACEMENT (IOC) RIGHT;  Surgeon: Lockie Mola, MD;  Location: [facility] ORS;  Service: Ophthalmology;  Laterality: Right;  cde5.26 [id number] ap8.0   COLONOSCOPY     COLONOSCOPY WITH PROPOFOL N/A 06/20/2015   Procedure: COLONOSCOPY WITH PROPOFOL;  Surgeon: Christena Deem, MD;  Location: [facility] ENDOSCOPY;  Service: Endoscopy;  Laterality: N/A;   EYE SURGERY     Cataract in one eye   FRACTURE SURGERY     wrist - right and right leg. (? if  any metal)   PARS PLANA VITRECTOMY Left 2010   Social History   Socioeconomic History   Marital status: Married    Spouse name: Dorene Sorrow   Number of children: Not on file   Years of education: Not on file   Highest education level: Not on file  Occupational History   Occupation: retired    Comment: Airline pilot  Tobacco Use   Smoking status: Never   Smokeless tobacco: Never  Vaping Use   Vaping Use: Never used  Substance and Sexual Activity   Alcohol use: Yes    Comment: 1-2 x a week   Drug use: No   Sexual activity: Not Currently  Other Topics Concern   Not on file  Social History Narrative   Not on file   Social Determinants of Health   Financial Resource Strain: Low Risk  (06/22/2020)   Overall Financial Resource Strain (CARDIA)    Difficulty of Paying Living Expenses: Not hard at all  Food Insecurity: No Food Insecurity (03/24/2023)   Hunger Vital Sign    Worried About Running Out of Food in the Last Year: Never true    Ran Out of Food in the Last Year: Never true  Transportation Needs: No Transportation Needs (03/24/2023)   PRAPARE - Administrator, Civil Service (Medical): No    Lack of Transportation (Non-Medical): No  Physical Activity: Not on file  Stress: No Stress Concern Present (06/16/2019)   [employer]  of Occupational Health - Occupational Stress Questionnaire    Feeling of Stress : Only a little  Social Connections: Unknown (06/22/2020)   Social Connection and Isolation Panel [NHANES]    Frequency of Communication with Friends and Family: Not on file    Frequency of Social Gatherings with Friends and Family: More than three times a week    Attends Religious Services: Not on Marketing executive or Organizations: Not on file    Attends Banker Meetings: Not on file    Marital Status: Married   Family History  Problem Relation Age of Onset   Breast cancer Mother 3   Diabetes Mother    Cancer Mother    Heart disease  Mother    Alzheimer's disease Father    Heart disease Maternal Aunt    Allergies  Allergen Reactions   Pseudoephedrine Hcl Anxiety and Other (See Comments)    Nervous   Sudafed [Pseudoephedrine] Anxiety and Other (See Comments)    Nervous   Prior to Admission medications   Medication Sig Start Date End Date Taking? Authorizing Provider  aspirin EC 81 MG tablet Take 81 mg by mouth daily. Swallow whole.    [provider]  atorvastatin (LIPITOR) 10 MG tablet Take 1 tablet (10 mg total) by mouth at bedtime. 05/07/22   Sherlene Shams, MD  carbidopa-levodopa (SINEMET CR) 50-200 MG tablet Take 1 tablet by mouth 5 (five) times daily. Patient takes at 0700,1030,1400,1730,2200 12/30/19   [provider]  DULoxetine (CYMBALTA) 60 MG capsule Take 60 mg by mouth daily.     [provider]  estradiol (ESTRACE) 0.1 MG/GM vaginal cream INSERT 1 APPLICATORFUL VAGINALLY 2 TIMES A WEEK AS DIRECTED 10/14/22   Sherlene Shams, MD  guaiFENesin-dextromethorphan (ROBITUSSIN DM) 100-10 MG/5ML syrup Take 5 mLs by mouth every 4 (four) hours as needed for cough. 03/20/23   Alford Highland, MD  hydrOXYzine (ATARAX/VISTARIL) 25 MG tablet Take 25 mg by mouth 2 (two) times daily as needed. 07/06/20   [provider]  lamoTRIgine (LAMICTAL) 100 MG tablet Take 100 mg by mouth 2 (two) times daily. 11/12/21   [provider]  nystatin (MYCOSTATIN/NYSTOP) powder Apply 1 application. topically 2 (two) times daily. To rash until resolved. 02/26/22   Sherlene Shams, MD  OVER THE COUNTER MEDICATION Take 1 capsule by mouth in the morning and at bedtime. Doterra Life Long Vitality Pack    [provider]  pregabalin (LYRICA) 150 MG capsule Take 150 mg by mouth 2 (two) times daily.  06/10/19   [provider]  QUEtiapine (SEROQUEL) 25 MG tablet Take 0.5 tablets (12.5 mg total) by mouth at bedtime. 03/20/23   Alford Highland, MD  sertraline (ZOLOFT) 100 MG tablet Take 200 mg by  mouth daily.     [provider]   DG Hip Unilat W or Wo Pelvis 2-3 Views Left  Result Date: 05/07/2023 CLINICAL DATA:  Pain after fall EXAM: DG HIP (WITH OR WITHOUT PELVIS) 3V LEFT COMPARISON:  None Available. FINDINGS: Osteopenia. There is a subcapital left femoral neck fracture with overriding of fracture fragments and angulation best depicted on the lateral view. Severe osteopenia. No additional fracture or dislocation. Osteophyte formation along both sacroiliac joints. Fixation hardware along the lower lumbar spine. Overlapping cardiac leads. There some well rounded densities in the pelvis which are indeterminate although possibly vascular. Pelvic view is rotated to the right. Hyperostosis. IMPRESSION: Displaced and angulated subcapital left femoral neck  fracture. Osteopenia with degenerative changes. Electronically Signed   By: Karen Kays M.D.   On: 05/07/2023 12:12   CT Cervical Spine Wo Contrast  Result Date: 05/07/2023 CLINICAL DATA:  77 year old female status post fall last night. Pain. EXAM: CT CERVICAL SPINE WITHOUT CONTRAST TECHNIQUE: Multidetector CT imaging of the cervical spine was performed without intravenous contrast. Multiplanar CT image reconstructions were also generated. RADIATION DOSE REDUCTION: This exam was performed according to the departmental dose-optimization program which includes automated exposure control, adjustment of the mA and/or kV according to patient size and/or use of iterative reconstruction technique. COMPARISON:  Head CT today. FINDINGS: Alignment: Cervicothoracic junction alignment is within normal limits. Bilateral posterior element alignment is within normal limits. Mild degenerative appearing anterolisthesis of C4 on C5, with chronic facet degeneration there greater on the left. Mild straightening of lordosis otherwise. Skull base and vertebrae: Some generalized osteopenia. Visualized skull base is intact. No atlanto-occipital dissociation. C1 and  C2 appear intact and aligned. No acute osseous abnormality identified. Soft tissues and spinal canal: No prevertebral fluid or swelling. No visible canal hematoma. Negative visible noncontrast neck soft tissues. Disc levels: Degenerative appearing facet ankylosis on the left at C5-C6. Adjacent segment spondylolisthesis and facet arthropathy at C4-C5. Advanced disc and endplate degeneration also at the adjacent C6-C7 level. Up to mild associated cervical spinal stenosis. Upper chest: Osteopenia. Grossly intact visible upper thoracic levels. Nonspecific patchy ground-glass opacity in the upper lungs, somewhat greater on the right and associated with pleural thickening and/or scarring there. But this is stable compared to prior CTA 03/17/2023 (please see that report) IMPRESSION: 1. No acute traumatic injury identified in the cervical spine. 2. Osteopenia, and advanced cervical spine degeneration surrounding degenerative appearing ankylosis of C5-C6. 3. Unresolved apical lung opacity from an April chest CTA, thought to be infectious or inflammatory at that time. Electronically Signed   By: Odessa Fleming M.D.   On: 05/07/2023 11:44   CT HEAD WO CONTRAST ( )  Result Date: 05/07/2023 CLINICAL DATA:  77 year old female status post fall last night. Hip pain. EXAM: CT HEAD WITHOUT CONTRAST TECHNIQUE: Contiguous axial images were obtained from the base of the skull through the vertex without intravenous contrast. RADIATION DOSE REDUCTION: This exam was performed according to the departmental dose-optimization program which includes automated exposure control, adjustment of the mA and/or kV according to patient size and/or use of iterative reconstruction technique. COMPARISON:  Brain MRI 10/01/2021 and head CT 03/17/2023. FINDINGS: Brain: Chronic cerebral volume loss with possibly ex vacuo enlargement of the lateral ventricles does not appear significantly changed from the 2022 MRI. No midline shift, mass effect, evidence of  mass lesion, intracranial hemorrhage or evidence of cortically based acute infarction. Moderate, Patchy and confluent white matter hypodensity with bilateral deep gray matter nuclei heterogeneity appears stable from the previous MRI. Vascular: Calcified atherosclerosis at the skull base. No suspicious intracranial vascular hyperdensity. Skull: No acute osseous abnormality identified. Sinuses/Orbits: Visualized paranasal sinuses and mastoids are clear. Other: No acute orbit or scalp soft tissue injury identified. IMPRESSION: 1. No acute traumatic injury identified. 2. Chronic ventriculomegaly and chronic small vessel disease appear not significantly changed from a 2022 MRI. Electronically Signed   By: Odessa Fleming M.D.   On: 05/07/2023 11:32    Positive ROS: All other systems have been reviewed and were otherwise negative with the exception of those mentioned in the HPI and as above.  Physical Exam: General:  Alert, no acute distress Psychiatric: The patient is alert and oriented to person  and the fact that she is at a hospital.  She is unable to appropriately recall the year, date, or current president. Cardiovascular:  No pedal edema Respiratory:  No wheezing, non-labored breathing GI:  Abdomen is soft and non-tender Skin:  No lesions in the area of chief complaint Neurologic:  Sensation intact distally Lymphatic:  No axillary or cervical lymphadenopathy  Orthopedic Exam:  Left lower extremity Mild shortening of the limb compared to the contralateral Tender to palpation over the hip with swelling and early ecchymosis forming Compartments all soft No tenderness palpation over the knee distal femur, tibia, fibula, foot or ankle Able to dorsiflex and plantarflex the foot and toes without pain Neurovascular intact distally with intact dorsalis pedis pulse  Secondary survey No tenderness to palpation over other bony prominences in the lower extremities or bilateral upper extremities No pain with  logroll or simulated axial loading of the right lower extremity All compartments soft No tenderness to palpation over the cervical or thoracic spine, no bony step-off Motor grossly intact throughout, no focal deficits Sensation grossly intact throughout, no focal deficits Good distal pulses and capillary refill on all extremities   X-rays:  X-rays of the hip and pelvis images and report reviewed myself which show a displaced left femoral neck fracture.  There is orthopedic hardware is noted in the lumbosacral spine no evidence of hardware fracture or movement.  Agree with radiologist interpretation of  Assessment: Displaced left femoral neck fracture  Plan: Luci is a 34 -year-old female who presents with a displaced left femoral neck fracture. I discussed the treatment options with the patient and her friend who is her POA. We discussed surgical and non surgical treatment options including bedrest treatment, under a shared decision making conversation the patient and friend would like to move forward with surgical intervention for her left hip.  The patient will need a hemiarthroplasty on the left side .  A long discussion took place with the patient describing what a hemiarthroplasty is and what the procedure would entail. The xray findings were reviewed with the patient and her friend Odella Aquas and the implants were discussed. The ability to secure the implant utilizing cement/ or cementless (press fit) fixation was discussed. Surgical exposures were discussed with the patient.    The hospitalization and post-operative care and rehabilitation were also discussed. The use of perioperative antibiotics and DVT prophylaxis were discussed. The risk, benefits and alternatives to a surgical intervention were discussed at length with the patient. The patient was also advised of risks related to the medical comorbidities. A lengthy discussion took place to review the most common complications  including but not limited to: deep vein thrombosis, pulmonary embolus, heart attack, stroke, infection, wound breakdown, dislocation, numbness, leg length in-equality, damage to nerves, intraoperative fracture, tendon,muscles, arteries or other blood vessels, death and other possible complications from anesthesia. The patient was told that we will take steps to minimize these risks by using sterile technique, antibiotics and DVT prophylaxis when appropriate and follow the patient postoperatively in the office setting to monitor progress. The possibility of recurrent pain, no improvement in pain and actual worsening of pain were also discussed with the patient. The risk of dislocation following surgery was discussed and potential precautions to prevent dislocation were reviewed.      The benefits of surgery were discussed with the patient including the potential for improving the patient's current clinical condition through operative intervention. Alternatives to surgical intervention including conservative management were also discussed in detail.  All questions were answered to the satisfaction of the patient. The patient participated and agreed to the plan of care as well as the use of the recommended implants for their surgery.    Plan for surgery tomorrow 05/08/2023 for left hip cemented hemiarthroplasty N.p.o. after midnight for the operating room Hold anticoagulation after midnight, can give dose of heparin today to decrease risk of blood clot formation prior to surgery All questions answered, hopefully surgery will be around noon tomorrow, advised the friend Odella Aquas to be present for consent purposes tomorrow.      Reinaldo Berber MD  Beeper #:  [phone]  05/07/2023 12:14 PM

## 2023-05-07 NOTE — H&P (View-Only) (Signed)
ORTHOPAEDIC CONSULTATION  REQUESTING PHYSICIAN: Robinson, Patrick, MD  Chief Complaint:   Left femoral neck fracture  History of Present Illness: Deborah Velazquez is a 76 y.o. female with a history of Parkinson's disease who ambulates primarily with a walker and has full-time around-the-clock care at home presented to the emergency room after having a fall while sleepwalking last night.  She does not recall any loss of consciousness.  Patient is unable to ambulate due to pain after the fall and was brought to emergency room for evaluation.  She denies any pre-existing hip pain.  She is accompanied by her friend Linda Wise who assists her with decision-making and does all of her legal signing as her power of attorney.  She denies any shortness of breath, chest pain, nausea, vomiting, or pain anywhere else in her body at this time.  Past Medical History:  Diagnosis Date   Anxiety    Arthritis    Hypercholesterolemia    Hyperlipemia    Irritable bowel syndrome    Parkinson disease    Pneumonia due to human metapneumovirus 03/18/2023   Restless leg syndrome    RSD (reflex sympathetic dystrophy)    pt denies   Stroke (HCC)    TIA's   Past Surgical History:  Procedure Laterality Date   APPENDECTOMY     BACK SURGERY     BREAST BIOPSY Left 08/16/2013   neg   CATARACT EXTRACTION Left    CATARACT EXTRACTION W/PHACO Right 06/01/2020   Procedure: CATARACT EXTRACTION PHACO AND INTRAOCULAR LENS PLACEMENT (IOC) RIGHT;  Surgeon: Brasington, Chadwick, MD;  Location: [facility];  Service: Ophthalmology;  Laterality: Right;  cde5.26 us01:05.7 ap8.0   COLONOSCOPY     COLONOSCOPY WITH PROPOFOL N/A 06/20/2015   Procedure: COLONOSCOPY WITH PROPOFOL;  Surgeon: Martin U Skulskie, MD;  Location: [facility];  Service: Endoscopy;  Laterality: N/A;   EYE SURGERY     Cataract in one eye   FRACTURE SURGERY     wrist - right and right leg. (? if  any metal)   PARS PLANA VITRECTOMY Left 2010   Social History   Socioeconomic History   Marital status: Married    Spouse name: Jerry   Number of children: Not on file   Years of education: Not on file   Highest education level: Not on file  Occupational History   Occupation: retired    Comment: sales  Tobacco Use   Smoking status: Never   Smokeless tobacco: Never  Vaping Use   Vaping Use: Never used  Substance and Sexual Activity   Alcohol use: Yes    Comment: 1-2 x a week   Drug use: No   Sexual activity: Not Currently  Other Topics Concern   Not on file  Social History Narrative   Not on file   Social Determinants of Health   Financial Resource Strain: Low Risk  (06/22/2020)   Overall Financial Resource Strain (CARDIA)    Difficulty of Paying Living Expenses: Not hard at all  Food Insecurity: No Food Insecurity (03/24/2023)   Hunger Vital Sign    Worried About Running Out of Food in the Last Year: Never true    Ran Out of Food in the Last Year: Never true  Transportation Needs: No Transportation Needs (03/24/2023)   PRAPARE - Transportation    Lack of Transportation (Medical): No    Lack of Transportation (Non-Medical): No  Physical Activity: Not on file  Stress: No Stress Concern Present (06/16/2019)   [employer] - Occupational Stress Questionnaire    Feeling of Stress : Only a little  Social Connections: Unknown (06/22/2020)   Social Connection and Isolation Panel [NHANES]    Frequency of Communication with Friends and Family: Not on file    Frequency of Social Gatherings with Friends and Family: More than three times a week    Attends Religious Services: Not on file    Active Member of Clubs or Organizations: Not on file    Attends Club or Organization Meetings: Not on file    Marital Status: Married   Family History  Problem Relation Age of Onset   Breast cancer Mother 50   Diabetes Mother    Cancer Mother    Heart disease  Mother    Alzheimer's disease Father    Heart disease Maternal Aunt    Allergies  Allergen Reactions   Pseudoephedrine Hcl Anxiety and Other (See Comments)    Nervous   Sudafed [Pseudoephedrine] Anxiety and Other (See Comments)    Nervous   Prior to Admission medications   Medication Sig Start Date End Date Taking? Authorizing Provider  aspirin EC 81 MG tablet Take 81 mg by mouth daily. Swallow whole.    [provider]  atorvastatin (LIPITOR) 10 MG tablet Take 1 tablet (10 mg total) by mouth at bedtime. 05/07/22   Tullo, Teresa L, MD  carbidopa-levodopa (SINEMET CR) 50-200 MG tablet Take 1 tablet by mouth 5 (five) times daily. Patient takes at 0700,1030,1400,1730,2200 12/30/19   [provider]  DULoxetine (CYMBALTA) 60 MG capsule Take 60 mg by mouth daily.     [provider]  estradiol (ESTRACE) 0.1 MG/GM vaginal cream INSERT 1 APPLICATORFUL VAGINALLY 2 TIMES A WEEK AS DIRECTED 10/14/22   Tullo, Teresa L, MD  guaiFENesin-dextromethorphan (ROBITUSSIN DM) 100-10 MG/5ML syrup Take 5 mLs by mouth every 4 (four) hours as needed for cough. 03/20/23   Wieting, Richard, MD  hydrOXYzine (ATARAX/VISTARIL) 25 MG tablet Take 25 mg by mouth 2 (two) times daily as needed. 07/06/20   [provider]  lamoTRIgine (LAMICTAL) 100 MG tablet Take 100 mg by mouth 2 (two) times daily. 11/12/21   [provider]  nystatin (MYCOSTATIN/NYSTOP) powder Apply 1 application. topically 2 (two) times daily. To rash until resolved. 02/26/22   Tullo, Teresa L, MD  OVER THE COUNTER MEDICATION Take 1 capsule by mouth in the morning and at bedtime. Doterra Life Long Vitality Pack    [provider]  pregabalin (LYRICA) 150 MG capsule Take 150 mg by mouth 2 (two) times daily.  06/10/19   [provider]  QUEtiapine (SEROQUEL) 25 MG tablet Take 0.5 tablets (12.5 mg total) by mouth at bedtime. 03/20/23   Wieting, Richard, MD  sertraline (ZOLOFT) 100 MG tablet Take 200 mg by  mouth daily.     [provider]   DG Hip Unilat W or Wo Pelvis 2-3 Views Left  Result Date: 05/07/2023 CLINICAL DATA:  Pain after fall EXAM: DG HIP (WITH OR WITHOUT PELVIS) 3V LEFT COMPARISON:  None Available. FINDINGS: Osteopenia. There is a subcapital left femoral neck fracture with overriding of fracture fragments and angulation best depicted on the lateral view. Severe osteopenia. No additional fracture or dislocation. Osteophyte formation along both sacroiliac joints. Fixation hardware along the lower lumbar spine. Overlapping cardiac leads. There some well rounded densities in the pelvis which are indeterminate although possibly vascular. Pelvic view is rotated to the right. Hyperostosis. IMPRESSION: Displaced and angulated subcapital left femoral neck   fracture. Osteopenia with degenerative changes. Electronically Signed   By: Ashok  Gupta M.D.   On: 05/07/2023 12:12   CT Cervical Spine Wo Contrast  Result Date: 05/07/2023 CLINICAL DATA:  76-year-old female status post fall last night. Pain. EXAM: CT CERVICAL SPINE WITHOUT CONTRAST TECHNIQUE: Multidetector CT imaging of the cervical spine was performed without intravenous contrast. Multiplanar CT image reconstructions were also generated. RADIATION DOSE REDUCTION: This exam was performed according to the departmental dose-optimization program which includes automated exposure control, adjustment of the mA and/or kV according to patient size and/or use of iterative reconstruction technique. COMPARISON:  Head CT today. FINDINGS: Alignment: Cervicothoracic junction alignment is within normal limits. Bilateral posterior element alignment is within normal limits. Mild degenerative appearing anterolisthesis of C4 on C5, with chronic facet degeneration there greater on the left. Mild straightening of lordosis otherwise. Skull base and vertebrae: Some generalized osteopenia. Visualized skull base is intact. No atlanto-occipital dissociation. C1 and  C2 appear intact and aligned. No acute osseous abnormality identified. Soft tissues and spinal canal: No prevertebral fluid or swelling. No visible canal hematoma. Negative visible noncontrast neck soft tissues. Disc levels: Degenerative appearing facet ankylosis on the left at C5-C6. Adjacent segment spondylolisthesis and facet arthropathy at C4-C5. Advanced disc and endplate degeneration also at the adjacent C6-C7 level. Up to mild associated cervical spinal stenosis. Upper chest: Osteopenia. Grossly intact visible upper thoracic levels. Nonspecific patchy ground-glass opacity in the upper lungs, somewhat greater on the right and associated with pleural thickening and/or scarring there. But this is stable compared to prior CTA 03/17/2023 (please see that report) IMPRESSION: 1. No acute traumatic injury identified in the cervical spine. 2. Osteopenia, and advanced cervical spine degeneration surrounding degenerative appearing ankylosis of C5-C6. 3. Unresolved apical lung opacity from an April chest CTA, thought to be infectious or inflammatory at that time. Electronically Signed   By: H  Hall M.D.   On: 05/07/2023 11:44   CT HEAD WO CONTRAST (5MM)  Result Date: 05/07/2023 CLINICAL DATA:  76-year-old female status post fall last night. Hip pain. EXAM: CT HEAD WITHOUT CONTRAST TECHNIQUE: Contiguous axial images were obtained from the base of the skull through the vertex without intravenous contrast. RADIATION DOSE REDUCTION: This exam was performed according to the departmental dose-optimization program which includes automated exposure control, adjustment of the mA and/or kV according to patient size and/or use of iterative reconstruction technique. COMPARISON:  Brain MRI 10/01/2021 and head CT 03/17/2023. FINDINGS: Brain: Chronic cerebral volume loss with possibly ex vacuo enlargement of the lateral ventricles does not appear significantly changed from the 2022 MRI. No midline shift, mass effect, evidence of  mass lesion, intracranial hemorrhage or evidence of cortically based acute infarction. Moderate, Patchy and confluent white matter hypodensity with bilateral deep gray matter nuclei heterogeneity appears stable from the previous MRI. Vascular: Calcified atherosclerosis at the skull base. No suspicious intracranial vascular hyperdensity. Skull: No acute osseous abnormality identified. Sinuses/Orbits: Visualized paranasal sinuses and mastoids are clear. Other: No acute orbit or scalp soft tissue injury identified. IMPRESSION: 1. No acute traumatic injury identified. 2. Chronic ventriculomegaly and chronic small vessel disease appear not significantly changed from a 2022 MRI. Electronically Signed   By: H  Hall M.D.   On: 05/07/2023 11:32    Positive ROS: All other systems have been reviewed and were otherwise negative with the exception of those mentioned in the HPI and as above.  Physical Exam: General:  Alert, no acute distress Psychiatric: The patient is alert and oriented to person   and the fact that she is at a hospital.  She is unable to appropriately recall the year, date, or current president. Cardiovascular:  No pedal edema Respiratory:  No wheezing, non-labored breathing GI:  Abdomen is soft and non-tender Skin:  No lesions in the area of chief complaint Neurologic:  Sensation intact distally Lymphatic:  No axillary or cervical lymphadenopathy  Orthopedic Exam:  Left lower extremity Mild shortening of the limb compared to the contralateral Tender to palpation over the hip with swelling and early ecchymosis forming Compartments all soft No tenderness palpation over the knee distal femur, tibia, fibula, foot or ankle Able to dorsiflex and plantarflex the foot and toes without pain Neurovascular intact distally with intact dorsalis pedis pulse  Secondary survey No tenderness to palpation over other bony prominences in the lower extremities or bilateral upper extremities No pain with  logroll or simulated axial loading of the right lower extremity All compartments soft No tenderness to palpation over the cervical or thoracic spine, no bony step-off Motor grossly intact throughout, no focal deficits Sensation grossly intact throughout, no focal deficits Good distal pulses and capillary refill on all extremities   X-rays:  X-rays of the hip and pelvis images and report reviewed myself which show a displaced left femoral neck fracture.  There is orthopedic hardware is noted in the lumbosacral spine no evidence of hardware fracture or movement.  Agree with radiologist interpretation of  Assessment: Displaced left femoral neck fracture  Plan: Mckenzi is a 76 -year-old female who presents with a displaced left femoral neck fracture. I discussed the treatment options with the patient and her friend who is her POA. We discussed surgical and non surgical treatment options including bedrest treatment, under a shared decision making conversation the patient and friend would like to move forward with surgical intervention for her left hip.  The patient will need a hemiarthroplasty on the left side .  A long discussion took place with the patient describing what a hemiarthroplasty is and what the procedure would entail. The xray findings were reviewed with the patient and her friend Linda Wise and the implants were discussed. The ability to secure the implant utilizing cement/ or cementless (press fit) fixation was discussed. Surgical exposures were discussed with the patient.    The hospitalization and post-operative care and rehabilitation were also discussed. The use of perioperative antibiotics and DVT prophylaxis were discussed. The risk, benefits and alternatives to a surgical intervention were discussed at length with the patient. The patient was also advised of risks related to the medical comorbidities. A lengthy discussion took place to review the most common complications  including but not limited to: deep vein thrombosis, pulmonary embolus, heart attack, stroke, infection, wound breakdown, dislocation, numbness, leg length in-equality, damage to nerves, intraoperative fracture, tendon,muscles, arteries or other blood vessels, death and other possible complications from anesthesia. The patient was told that we will take steps to minimize these risks by using sterile technique, antibiotics and DVT prophylaxis when appropriate and follow the patient postoperatively in the office setting to monitor progress. The possibility of recurrent pain, no improvement in pain and actual worsening of pain were also discussed with the patient. The risk of dislocation following surgery was discussed and potential precautions to prevent dislocation were reviewed.      The benefits of surgery were discussed with the patient including the potential for improving the patient's current clinical condition through operative intervention. Alternatives to surgical intervention including conservative management were also discussed in detail.   All questions were answered to the satisfaction of the patient. The patient participated and agreed to the plan of care as well as the use of the recommended implants for their surgery.    Plan for surgery tomorrow 05/08/2023 for left hip cemented hemiarthroplasty N.p.o. after midnight for the operating room Hold anticoagulation after midnight, can give dose of heparin today to decrease risk of blood clot formation prior to surgery All questions answered, hopefully surgery will be around noon tomorrow, advised the friend Linda Wise to be present for consent purposes tomorrow.      Tinnie Kunin MD  Beeper #:  [phone]  05/07/2023 12:14 PM  

## 2023-05-08 ENCOUNTER — Encounter
Admission: EM | Disposition: A | Discharge status: Home Health | Payer: Self-pay | Source: Home / Self Care | Attending: Internal Medicine

## 2023-05-08 ENCOUNTER — Other Ambulatory Visit: Payer: Self-pay

## 2023-05-08 ENCOUNTER — Inpatient Hospital Stay: Payer: PPO | Admitting: Registered Nurse

## 2023-05-08 ENCOUNTER — Inpatient Hospital Stay: Payer: PPO

## 2023-05-08 DIAGNOSIS — G20B2 Parkinson's disease with dyskinesia, with fluctuations: Secondary | ICD-10-CM

## 2023-05-08 HISTORY — PX: HIP ARTHROPLASTY: SHX981

## 2023-05-08 LAB — BASIC METABOLIC PANEL
Anion gap: 8 (ref 5–15)
BUN: 9 mg/dL (ref 8–23)
CO2: 24 mmol/L (ref 22–32)
Calcium: 8.9 mg/dL (ref 8.9–10.3)
Chloride: 105 mmol/L (ref 98–111)
Creatinine, Ser: 0.55 mg/dL (ref 0.44–1.00)
GFR, Estimated: >60 mL/min (ref >60)
Glucose, Bld: 107 mg/dL — ABNORMAL HIGH (ref 70–99)
Potassium: 3.7 mmol/L (ref 3.5–5.1)
Sodium: 137 mmol/L (ref 135–145)

## 2023-05-08 LAB — CBC
HCT: 37.4 % (ref 36.0–46.0)
Hemoglobin: 12.4 g/dL (ref 12.0–15.0)
MCH: 30.1 pg (ref 26.0–34.0)
MCHC: 33.2 g/dL (ref 30.0–36.0)
MCV: 90.8 fL (ref 80.0–100.0)
Platelets: 188 10*3/uL (ref 150–400)
RBC: 4.12 MIL/uL (ref 3.87–5.11)
RDW: 14.4 % (ref 11.5–15.5)
WBC: 8 10*3/uL (ref 4.0–10.5)
nRBC: 0 % (ref 0.0–0.2)

## 2023-05-08 SURGERY — HEMIARTHROPLASTY, HIP, DIRECT ANTERIOR APPROACH, FOR FRACTURE
Anesthesia: General | Site: Hip | Laterality: Left

## 2023-05-08 MED ORDER — ACETAMINOPHEN 10 MG/ML IV SOLN
INTRAVENOUS | Status: DC | PRN
  Administered 2023-05-08: 1000 mg via INTRAVENOUS

## 2023-05-08 MED ORDER — CEFAZOLIN SODIUM-DEXTROSE 2-4 GM/100ML-% IV SOLN
INTRAVENOUS | Status: AC
  Filled 2023-05-08: qty 100

## 2023-05-08 MED ORDER — ONDANSETRON HCL 4 MG PO TABS: 4.0000 mg | ORAL_TABLET | Freq: Four times a day (QID) | ORAL | Status: DC | PRN

## 2023-05-08 MED ORDER — ENOXAPARIN SODIUM 40 MG/0.4ML IJ SOSY
40.0000 mg | PREFILLED_SYRINGE | INTRAMUSCULAR | Status: DC
  Administered 2023-05-09 – 2023-05-12 (×4): 40 mg via SUBCUTANEOUS
  Filled 2023-05-08 (×4): qty 0.4

## 2023-05-08 MED ORDER — ACETAMINOPHEN 500 MG PO TABS
500.0000 mg | ORAL_TABLET | Freq: Four times a day (QID) | ORAL | Status: AC
  Administered 2023-05-08 – 2023-05-09 (×2): 500 mg via ORAL
  Filled 2023-05-08 (×3): qty 1

## 2023-05-08 MED ORDER — PROPOFOL 500 MG/50ML IV EMUL
INTRAVENOUS | Status: DC | PRN
  Administered 2023-05-08: 50 ug/kg/min via INTRAVENOUS

## 2023-05-08 MED ORDER — ACETAMINOPHEN 325 MG PO TABS
325.0000 mg | ORAL_TABLET | Freq: Four times a day (QID) | ORAL | Status: DC | PRN
  Administered 2023-05-09 – 2023-05-11 (×2): 650 mg via ORAL
  Filled 2023-05-08 (×4): qty 2

## 2023-05-08 MED ORDER — HYDROCODONE-ACETAMINOPHEN 7.5-325 MG PO TABS
1.0000 | ORAL_TABLET | ORAL | Status: DC | PRN
  Filled 2023-05-08: qty 2

## 2023-05-08 MED ORDER — OXYCODONE HCL 5 MG/5ML PO SOLN: 5.0000 mg | Freq: Once | ORAL | Status: DC | PRN

## 2023-05-08 MED ORDER — CEFAZOLIN SODIUM 1 G IJ SOLR
2.0000 g | Freq: Once | INTRAMUSCULAR | Status: DC
  Filled 2023-05-08: qty 20

## 2023-05-08 MED ORDER — LACTATED RINGERS IV SOLN: INTRAVENOUS | Status: DC

## 2023-05-08 MED ORDER — FENTANYL CITRATE (PF) 100 MCG/2ML IJ SOLN
INTRAMUSCULAR | Status: AC
  Filled 2023-05-08: qty 2

## 2023-05-08 MED ORDER — EPHEDRINE SULFATE (PRESSORS) 50 MG/ML IJ SOLN
INTRAMUSCULAR | Status: DC | PRN
  Administered 2023-05-08 (×2): 10 mg via INTRAVENOUS

## 2023-05-08 MED ORDER — LACTATED RINGERS IV SOLN: INTRAVENOUS | Status: DC | PRN

## 2023-05-08 MED ORDER — SODIUM CHLORIDE (PF) 0.9 % IJ SOLN
INTRAMUSCULAR | Status: DC | PRN
  Administered 2023-05-08: 50 mL via INTRAMUSCULAR

## 2023-05-08 MED ORDER — PHENYLEPHRINE HCL-NACL 20-0.9 MG/250ML-% IV SOLN
INTRAVENOUS | Status: DC | PRN
  Administered 2023-05-08: 20 ug/min via INTRAVENOUS

## 2023-05-08 MED ORDER — MIDAZOLAM HCL 5 MG/5ML IJ SOLN
INTRAMUSCULAR | Status: DC | PRN
  Administered 2023-05-08 (×2): .5 mg via INTRAVENOUS

## 2023-05-08 MED ORDER — CEFAZOLIN SODIUM-DEXTROSE 2-4 GM/100ML-% IV SOLN
2.0000 g | Freq: Four times a day (QID) | INTRAVENOUS | Status: AC
  Administered 2023-05-08 – 2023-05-09 (×2): 2 g via INTRAVENOUS
  Filled 2023-05-08 (×2): qty 100

## 2023-05-08 MED ORDER — HYDROCODONE-ACETAMINOPHEN 5-325 MG PO TABS: 1.0000 | ORAL_TABLET | ORAL | Status: DC | PRN

## 2023-05-08 MED ORDER — DEXAMETHASONE SODIUM PHOSPHATE 10 MG/ML IJ SOLN
INTRAMUSCULAR | Status: AC
  Filled 2023-05-08: qty 1

## 2023-05-08 MED ORDER — MORPHINE SULFATE (PF) 2 MG/ML IV SOLN: 0.5000 mg | INTRAVENOUS | Status: DC | PRN

## 2023-05-08 MED ORDER — PHENOL 1.4 % MT LIQD: 1.0000 | OROMUCOSAL | Status: DC | PRN

## 2023-05-08 MED ORDER — ACETAMINOPHEN 10 MG/ML IV SOLN: 1000.0000 mg | Freq: Once | INTRAVENOUS | Status: DC | PRN

## 2023-05-08 MED ORDER — OXYCODONE HCL 5 MG PO TABS: 5.0000 mg | ORAL_TABLET | Freq: Once | ORAL | Status: DC | PRN

## 2023-05-08 MED ORDER — TRANEXAMIC ACID-NACL 1000-0.7 MG/100ML-% IV SOLN: 1000.0000 mg | INTRAVENOUS | Status: AC

## 2023-05-08 MED ORDER — DEXAMETHASONE SODIUM PHOSPHATE 10 MG/ML IJ SOLN
INTRAMUSCULAR | Status: DC | PRN
  Administered 2023-05-08: 8 mg via INTRAVENOUS

## 2023-05-08 MED ORDER — TRANEXAMIC ACID-NACL 1000-0.7 MG/100ML-% IV SOLN
1000.0000 mg | INTRAVENOUS | Status: AC
  Administered 2023-05-08 (×2): 1000 mg via INTRAVENOUS

## 2023-05-08 MED ORDER — ACETAMINOPHEN 10 MG/ML IV SOLN
INTRAVENOUS | Status: AC
  Filled 2023-05-08: qty 100

## 2023-05-08 MED ORDER — 0.9 % SODIUM CHLORIDE (POUR BTL) OPTIME
TOPICAL | Status: DC | PRN
  Administered 2023-05-08: 500 mL

## 2023-05-08 MED ORDER — MIDAZOLAM HCL 2 MG/2ML IJ SOLN
INTRAMUSCULAR | Status: AC
  Filled 2023-05-08: qty 2

## 2023-05-08 MED ORDER — CEFAZOLIN SODIUM-DEXTROSE 2-3 GM-%(50ML) IV SOLR
INTRAVENOUS | Status: DC | PRN
  Administered 2023-05-08: 2 g via INTRAVENOUS

## 2023-05-08 MED ORDER — METOCLOPRAMIDE HCL 5 MG PO TABS: 5.0000 mg | ORAL_TABLET | Freq: Three times a day (TID) | ORAL | Status: DC | PRN

## 2023-05-08 MED ORDER — METOCLOPRAMIDE HCL 5 MG/ML IJ SOLN: 5.0000 mg | Freq: Three times a day (TID) | INTRAMUSCULAR | Status: DC | PRN

## 2023-05-08 MED ORDER — DOCUSATE SODIUM 100 MG PO CAPS
100.0000 mg | ORAL_CAPSULE | Freq: Two times a day (BID) | ORAL | Status: DC
  Administered 2023-05-08 – 2023-05-12 (×7): 100 mg via ORAL
  Filled 2023-05-08 (×8): qty 1

## 2023-05-08 MED ORDER — ONDANSETRON HCL 4 MG/2ML IJ SOLN: 4.0000 mg | Freq: Four times a day (QID) | INTRAMUSCULAR | Status: DC | PRN

## 2023-05-08 MED ORDER — SURGIPHOR WOUND IRRIGATION SYSTEM - OPTIME: TOPICAL | Status: DC | PRN

## 2023-05-08 MED ORDER — TRANEXAMIC ACID-NACL 1000-0.7 MG/100ML-% IV SOLN
INTRAVENOUS | Status: AC
  Filled 2023-05-08: qty 100

## 2023-05-08 MED ORDER — PROPOFOL 10 MG/ML IV BOLUS
INTRAVENOUS | Status: AC
  Filled 2023-05-08: qty 40

## 2023-05-08 MED ORDER — SODIUM CHLORIDE 0.9 % IR SOLN
Status: DC | PRN
  Administered 2023-05-08: 100 mL
  Administered 2023-05-08: 3000 mL

## 2023-05-08 MED ORDER — PHENYLEPHRINE 80 MCG/ML (10ML) SYRINGE FOR IV PUSH (FOR BLOOD PRESSURE SUPPORT)
PREFILLED_SYRINGE | INTRAVENOUS | Status: AC
  Filled 2023-05-08: qty 10

## 2023-05-08 MED ORDER — BUPIVACAINE HCL (PF) 0.5 % IJ SOLN
INTRAMUSCULAR | Status: AC
  Filled 2023-05-08: qty 10

## 2023-05-08 MED ORDER — FENTANYL CITRATE (PF) 100 MCG/2ML IJ SOLN: 25.0000 ug | INTRAMUSCULAR | Status: DC | PRN

## 2023-05-08 MED ORDER — FENTANYL CITRATE (PF) 100 MCG/2ML IJ SOLN
INTRAMUSCULAR | Status: DC | PRN
  Administered 2023-05-08: 25 ug via INTRAVENOUS

## 2023-05-08 MED ORDER — KETAMINE HCL 10 MG/ML IJ SOLN
INTRAMUSCULAR | Status: DC | PRN
  Administered 2023-05-08: 20 mg via INTRAVENOUS
  Administered 2023-05-08: 30 mg via INTRAVENOUS

## 2023-05-08 MED ORDER — EPHEDRINE 5 MG/ML INJ
INTRAVENOUS | Status: AC
  Filled 2023-05-08: qty 5

## 2023-05-08 MED ORDER — DEXMEDETOMIDINE HCL IN NACL 80 MCG/20ML IV SOLN
INTRAVENOUS | Status: DC | PRN
  Administered 2023-05-08: 12 ug via INTRAVENOUS

## 2023-05-08 MED ORDER — KETAMINE HCL 50 MG/5ML IJ SOSY
PREFILLED_SYRINGE | INTRAMUSCULAR | Status: AC
  Filled 2023-05-08: qty 5

## 2023-05-08 MED ORDER — BUPIVACAINE HCL (PF) 0.5 % IJ SOLN
INTRAMUSCULAR | Status: DC | PRN
  Administered 2023-05-08: 3 mL

## 2023-05-08 MED ORDER — MENTHOL 3 MG MT LOZG: 1.0000 | LOZENGE | OROMUCOSAL | Status: DC | PRN

## 2023-05-08 SURGICAL SUPPLY — 78 items
ADH SKN CLS APL DERMABOND .7 (GAUZE/BANDAGES/DRESSINGS) ×1
APL PRP STRL LF DISP 70% ISPRP (MISCELLANEOUS) ×2
BLADE SAGITTAL AGGR TOOTH XLG (BLADE) ×2 IMPLANT
BNDG CMPR 5X6 CHSV STRCH STRL (GAUZE/BANDAGES/DRESSINGS) ×1
BNDG COHESIVE 6X5 TAN ST LF (GAUZE/BANDAGES/DRESSINGS) ×2 IMPLANT
CEMENT BONE 10-PACK (Cement) IMPLANT
CHLORAPREP W/TINT 26 (MISCELLANEOUS) ×4 IMPLANT
COVER BACK TABLE REUSABLE LG (DRAPES) ×2 IMPLANT
COVER SET STULBERG POSITIONER (MISCELLANEOUS) ×2 IMPLANT
DERMABOND ADVANCED .7 DNX12 (GAUZE/BANDAGES/DRESSINGS) ×2 IMPLANT
DRAPE 3/4 80X56 (DRAPES) ×2 IMPLANT
DRAPE IMP U-DRAPE 54X76 (DRAPES) ×2 IMPLANT
DRAPE INCISE IOBAN 66X60 STRL (DRAPES) ×2 IMPLANT
DRAPE POUCH INSTRU U-SHP 10X18 (DRAPES) ×2 IMPLANT
DRAPE U-SHAPE 47X51 STRL (DRAPES) ×2 IMPLANT
DRSG MEPILEX SACRM 8.7X9.8 (GAUZE/BANDAGES/DRESSINGS) ×2 IMPLANT
DRSG OPSITE POSTOP 4X10 (GAUZE/BANDAGES/DRESSINGS) IMPLANT
DRSG OPSITE POSTOP 4X12 (GAUZE/BANDAGES/DRESSINGS) IMPLANT
DRSG OPSITE POSTOP 4X8 (GAUZE/BANDAGES/DRESSINGS) IMPLANT
ELECT REM PT RETURN 9FT ADLT (ELECTROSURGICAL) ×1
ELECTRODE REM PT RTRN 9FT ADLT (ELECTROSURGICAL) ×2 IMPLANT
FEMORAL HEAD LFIT (Knees) ×1 IMPLANT
GLOVE BIO SURGEON STRL SZ8 (GLOVE) ×2 IMPLANT
GLOVE BIOGEL PI IND STRL 8 (GLOVE) ×2 IMPLANT
GLOVE PI ORTHO PRO STRL 7.5 (GLOVE) ×4 IMPLANT
GLOVE PI ORTHO PRO STRL SZ8 (GLOVE) ×4 IMPLANT
GLOVE SURG SYN 7.5 E (GLOVE) ×2 IMPLANT
GLOVE SURG SYN 7.5 PF PI (GLOVE) ×4 IMPLANT
GOWN SRG XL LVL 3 NONREINFORCE (GOWNS) ×2 IMPLANT
GOWN STRL NON-REIN TWL XL LVL3 (GOWNS) ×1
GOWN STRL REUS W/ TWL LRG LVL3 (GOWN DISPOSABLE) ×2 IMPLANT
GOWN STRL REUS W/ TWL XL LVL3 (GOWN DISPOSABLE) ×2 IMPLANT
GOWN STRL REUS W/TWL LRG LVL3 (GOWN DISPOSABLE) ×1
GOWN STRL REUS W/TWL XL LVL3 (GOWN DISPOSABLE) ×1
HANDLE YANKAUER SUCT OPEN TIP (MISCELLANEOUS) ×2 IMPLANT
HEAD FEM 0XOFST TPR 26X (Knees) IMPLANT
HEAD LOCK BP UHR HIP 26X43 (Orthopedic Implant) IMPLANT
HOOD PEEL AWAY T7 (MISCELLANEOUS) ×4 IMPLANT
IV NS 100ML SINGLE PACK (IV SOLUTION) ×2 IMPLANT
IV NS IRRIG 3000ML ARTHROMATIC (IV SOLUTION) ×2 IMPLANT
KIT PREP HIP W/CEMENT RESTRICT (Miscellaneous) IMPLANT
KIT PREPARATION TOTAL HIP (Miscellaneous) ×1 IMPLANT
KIT TURNOVER KIT A (KITS) ×2 IMPLANT
MANIFOLD NEPTUNE II (INSTRUMENTS) ×2 IMPLANT
MAT ABSORB FLUID 56X50 GRAY (MISCELLANEOUS) ×2 IMPLANT
NDL FILTER BLUNT 18X1 1/2 (NEEDLE) ×2 IMPLANT
NDL SAFETY ECLIP 18X1.5 (MISCELLANEOUS) ×2 IMPLANT
NDL SPNL 20GX3.5 QUINCKE YW (NEEDLE) ×2 IMPLANT
NEEDLE FILTER BLUNT 18X1 1/2 (NEEDLE) ×1 IMPLANT
NEEDLE SPNL 20GX3.5 QUINCKE YW (NEEDLE) ×1 IMPLANT
NS IRRIG 500ML POUR BTL (IV SOLUTION) IMPLANT
PACK HIP PROSTHESIS (MISCELLANEOUS) ×2 IMPLANT
PENCIL SMOKE EVACUATOR (MISCELLANEOUS) ×2 IMPLANT
PILLOW ABDUCTION FOAM SM (MISCELLANEOUS) ×2 IMPLANT
PULSAVAC PLUS IRRIG FAN TIP (DISPOSABLE) ×1
RETRIEVER SUT HEWSON (MISCELLANEOUS) ×2 IMPLANT
SLEEVE SCD COMPRESS KNEE MED (STOCKING) ×2 IMPLANT
SOLUTION IRRIG SURGIPHOR (IV SOLUTION) ×2 IMPLANT
SPACER DIST ACCOLADE 2/3 12 OD (Spacer) IMPLANT
STEM FEM CMT 43X131X35 SZ3 (Stem) IMPLANT
SUT BONE WAX W31G (SUTURE) ×2 IMPLANT
SUT DVC 2 QUILL PDO T11 36X36 (SUTURE) ×2 IMPLANT
SUT ETHIBOND #5 BRAIDED 30INL (SUTURE) ×2 IMPLANT
SUT QUILL MONODERM 3-0 PS-2 (SUTURE) ×2 IMPLANT
SUT VIC AB 0 CT1 36 (SUTURE) ×2 IMPLANT
SUT VIC AB 2-0 CT2 27 (SUTURE) ×4 IMPLANT
SUT VICRYL 1-0 27IN ABS (SUTURE) ×1
SUTURE VICRYL 1-0 27IN ABS (SUTURE) ×2 IMPLANT
SYR 30ML LL (SYRINGE) ×4 IMPLANT
SYR TB 1ML LL NO SAFETY (SYRINGE) ×2 IMPLANT
TIP BRUSH PULSAVAC PLUS 24.33 (MISCELLANEOUS) IMPLANT
TIP FAN IRRIG PULSAVAC PLUS (DISPOSABLE) ×2 IMPLANT
TOWEL OR 17X26 4PK STRL BLUE (TOWEL DISPOSABLE) IMPLANT
TOWER CARTRIDGE SMART MIX (DISPOSABLE) IMPLANT
TRAP FLUID SMOKE EVACUATOR (MISCELLANEOUS) ×2 IMPLANT
TRAY FOLEY SLVR 16FR LF STAT (SET/KITS/TRAYS/PACK) ×2 IMPLANT
WAND WEREWOLF FASTSEAL 6.0 (MISCELLANEOUS) ×2 IMPLANT
WATER STERILE IRR 1000ML POUR (IV SOLUTION) ×2 IMPLANT

## 2023-05-08 NOTE — Plan of Care (Signed)
  Problem: Education: Goal: Knowledge of General Education information will improve Description Including pain rating scale, medication(s)/side effects and non-pharmacologic comfort measures Outcome: Progressing   Problem: Health Behavior/Discharge Planning: Goal: Ability to manage health-related needs will improve Outcome: Progressing   Problem: Clinical Measurements: Goal: Ability to maintain clinical measurements within normal limits will improve Outcome: Progressing Goal: Will remain free from infection Outcome: Progressing Goal: Diagnostic test results will improve Outcome: Progressing Goal: Respiratory complications will improve Outcome: Progressing Goal: Cardiovascular complication will be avoided Outcome: Progressing   Problem: Activity: Goal: Risk for activity intolerance will decrease Outcome: Progressing   Problem: Nutrition: Goal: Adequate nutrition will be maintained Outcome: Progressing   Problem: Coping: Goal: Level of anxiety will decrease Outcome: Progressing   Problem: Elimination: Goal: Will not experience complications related to bowel motility Outcome: Progressing Goal: Will not experience complications related to urinary retention Outcome: Progressing   Problem: Pain Managment: Goal: General experience of comfort will improve Outcome: Progressing   Problem: Safety: Goal: Ability to remain free from injury will improve Outcome: Progressing   Problem: Skin Integrity: Goal: Risk for impaired skin integrity will decrease Outcome: Progressing   Problem: Education: Goal: Verbalization of understanding the information provided (i.e., activity precautions, restrictions, etc) will improve Outcome: Progressing   Problem: Activity: Goal: Ability to ambulate and perform ADLs will improve Outcome: Progressing   Problem: Clinical Measurements: Goal: Postoperative complications will be avoided or minimized Outcome: Progressing   Problem:  Self-Concept: Goal: Ability to maintain and perform role responsibilities to the fullest extent possible will improve Outcome: Progressing   Problem: Pain Management: Goal: Pain level will decrease Outcome: Progressing   

## 2023-05-08 NOTE — Progress Notes (Signed)
  Progress Note   Patient: Deborah Velazquez:[id number] DOB: 05-28-1946 DOA: 05/07/2023     1 DOS: the patient was seen and examined on 05/08/2023   Brief hospital course: Deborah Velazquez is a 77 y.o. female with medical history significant of Parkinson's disease using walker to ambulate, Lewy body dementia, HLD, IBS,, TIA, CVA, anxiety, who presented with left hip pain status fall while sleep walking. Hip x-ray showed left hip fracture.  Patient was admitted to hospitalist service with orthopedic surgery consultation.  Assessment and Plan: Left hip fracture s/p fall Orthopedic surgery to take her to the OR today. Patient is at moderate risk for orthopedic surgery given her age. Continue pain control. She is at high risk for fall, fall precautions ordered. PT evaluation postsurgery per Ortho team.  Abnormal UA- Urine cultures ordered to prior sample. If positive will start antibiotics.  Prior history of CVA-stable History of Parkinson disease with cognitive impairment History of Lewy body dementia- Follows neurology as outpatient. Continue carbidopa-levodopa and other home medications. Continue anxiolytics. Nursing supportive care.  DVT prophylaxis-SCD. Fall, aspiration precautions. CODE STATUS full code      Subjective: Patient is seen and examined today morning.  She is sleeping comfortably which daughter states is not uncommon.  Unable to do full review of systems due to her being a poor historian.  Physical Exam: Vitals:   05/07/23 1401 05/07/23 1500 05/07/23 2304 05/08/23 0812  BP: 139/88 (!) 151/91 131/77 132/78  Pulse: 73 77 82 87  Resp: 18 18 14 18   Temp:  98 F (36.7 C) 98.3 F (36.8 C) 98.8 F (37.1 C)  TempSrc:  Oral Oral   SpO2: 92% 95% (!) 89% 90%  Weight:      Height: 5\' 5"  (1.651 m)      General - Elderly Caucasian female, sleeping, no apparent distress HEENT - PERRLA, EOMI, atraumatic head, non tender sinuses. Lung - Clear, diffuse rales. Heart -  S1, S2 heard, no murmurs, rubs, trace pedal edema Neuro -lethargic, unable to follow commands. Skin - Warm and dry. Data Reviewed:  CBC, CMP, blood sugar  Family Communication: Patient's daughter at bedside, informed about the plan, all questions answered.  Disposition: Status is: Inpatient Remains inpatient appropriate because: Need orthopedic surgery intervention, safe discharge plan  Planned Discharge Destination: Home with Home Health    Time spent: 45 minutes  Author: Marcelino Duster, MD 05/08/2023 10:44 AM  For on call review [email or web address].

## 2023-05-08 NOTE — Anesthesia Preprocedure Evaluation (Signed)
Anesthesia Evaluation  Patient identified by MRN, date of birth, ID band Patient confused    Reviewed: Allergy & Precautions, NPO status , Patient's Chart, lab work & pertinent test results  History of Anesthesia Complications Negative for: history of anesthetic complications  Airway Mallampati: Unable to assess   Neck ROM: Full    Dental   Pulmonary pneumonia   Pulmonary exam normal breath sounds clear to auscultation       Cardiovascular negative cardio ROS Normal cardiovascular exam Rhythm:Regular Rate:Normal     Neuro/Psych  PSYCHIATRIC DISORDERS Anxiety    Dementia  Neuromuscular disease (Parkinson disease) CVA    GI/Hepatic negative GI ROS,,,  Endo/Other  negative endocrine ROS    Renal/GU negative Renal ROS     Musculoskeletal  (+) Arthritis ,    Abdominal   Peds  Hematology negative hematology ROS (+)   Anesthesia Other Findings   Reproductive/Obstetrics                              Anesthesia Physical Anesthesia Plan  ASA: 3  Anesthesia Plan: General and Spinal   Post-op Pain Management:    Induction: Intravenous  PONV Risk Score and Plan: 3 and Propofol infusion, TIVA, Treatment may vary due to age or medical condition and Ondansetron  Airway Management Planned: Natural Airway and Nasal Cannula  Additional Equipment:   Intra-op Plan:   Post-operative Plan:   Informed Consent: I have reviewed the patients History and Physical, chart, labs and discussed the procedure including the risks, benefits and alternatives for the proposed anesthesia with the patient or authorized representative who has indicated his/her understanding and acceptance.     Consent reviewed with POA  Plan Discussed with: CRNA  Anesthesia Plan Comments: (Plan for spinal and GA with natural airway, LMA/GETA backup.  Patient HC-POA at bedside, consented for risks of anesthesia including but not  limited to:  - adverse reactions to medications - damage to eyes, teeth, lips or other oral mucosa - nerve damage due to positioning  - sore throat or hoarseness - headache, bleeding, infection, nerve damage 2/2 spinal - damage to heart, brain, nerves, lungs, other parts of body or loss of life  Informed patient's HC-POA about role of CRNA in peri- and intra-operative care; she voiced understanding.)         Anesthesia Quick Evaluation

## 2023-05-08 NOTE — Plan of Care (Signed)

## 2023-05-08 NOTE — Anesthesia Postprocedure Evaluation (Signed)
Anesthesia Post Note  Patient: Deborah Velazquez  Procedure(s) Performed: ARTHROPLASTY BIPOLAR HIP (HEMIARTHROPLASTY) (Left: Hip)  Patient location during evaluation: PACU Anesthesia Type: Spinal Level of consciousness: awake and alert, oriented and patient cooperative Pain management: pain level controlled Vital Signs Assessment: post-procedure vital signs reviewed and stable Respiratory status: spontaneous breathing, nonlabored ventilation and respiratory function stable Cardiovascular status: blood pressure returned to baseline and stable Postop Assessment: adequate PO intake, no headache, no backache and spinal receding Anesthetic complications: no   No notable events documented.   Last Vitals:  Vitals:   05/08/23 1600 05/08/23 1615  BP: 104/73 113/70  Pulse: 79 73  Resp: 13 14  Temp:  (!) 36.2 C  SpO2: 99% 99%    Last Pain:  Vitals:   05/08/23 1615  TempSrc:   PainSc: Asleep                 Reed Breech

## 2023-05-08 NOTE — Op Note (Signed)
Patient Name: Deborah Velazquez  UEA:[id number]  Pre-Operative Diagnosis: Left displaced femoral neck fracture  Post-Operative Diagnosis: (same)  Procedure: Left hip cemented hemiarthroplasty  Components/Implants:  Stem: Accolade C #3 x 127 deg w/ 12mm distal spacer  Head: LFIT 26 +4mm with bipolar UHR 43/11mm  Date of Surgery: 05/08/2023  Surgeon: Reinaldo Berber MD  Assistant: Amador Cunas PA (present and scrubbed throughout the case, critical for assistance with exposure, retraction, instrumentation, and closure)   Anesthesiologist: Mazzoni  Anesthesia: Spinal   IVF:800cc  EBL: 250cc  Complications: None   Brief history: The patient is a 77 year old female with a displaced left femoral neck fracture. A thorough discussion about treatment options including alternatives to surgery were discussed with the patient and family/POA and the risks and benefits of cemented hemiarthroplasty as definitive surgical fixation were reviewed.  The patient was admitted to the hospital by the medical team and preoperative optimization including risk stratification and clearance for surgery was performed. All preoperative films were reviewed and an appropriate surgical plan was made prior to surgery.   Description of procedure: The patient was brought to the operating room where laterality was confirmed by all those present to be the left side.  The patient was moved to the table and administered spinal anesthesia. Patient was given an intravenous dose of antibiotics for surgical prophylaxis and TXA. The patient was positioned in lateral decubitus position with all bony prominences well-padded.  Surgical site was prepped with alcohol and chlorhexidine.  Surgical site over the hip was draped in typical sterile fashion with multiple layers of adhesive and nonadhesive drapes.  The incision site over the greater trochanter posteriorly was marked out with a sterile marker.   Surgical timeout was then called  with participation of all staff in the room the patient was confirmed and laterality again confirmed.  An incision was made over the lateral aspect of the hip cheating posteriorly on the proximal aspect.  Careful soft tissue dissection and coagulation of all bleeders was carried out down to the level of the glut max fascia.  The fascia was carefully incised in line with the femur.  A Charnley retractor was placed deep to the fascia with care taken to ensure that there was no nerve entrapment in the retractor.  The bursa tissue was taken down over the posterior femur exposing the external rotators.  A dull Cobra retractor was placed under the abductor mechanism to protect the mechanism and fully expose the piriformis and short external rotators.  The external rotators were carefully detached from the femur with electrocautery and tagged with Ethibond sutures.  The capsule to the hip was incised and tagged with sutures.  Care was taken to ensure the labrum was not cut during the approach. The femoral neck fracture was encountered at this time and shown to be displaced 100% with the remaining femoral neck in front of the acetabulum and head.  The femur was rotated up and a retractor was placed behind it.  A template was used to mark out a femoral neck cleanup cut.  Cobra retractors were placed superiorly and inferiorly along the remaining femoral neck to protect from saw excursion.  An oscillating saw was then used to perform a new femoral neck cut.   After the femoral and neck resection attention was turned back to the broken femoral head which remained in the acetabulum.  A corkscrew was used to carefully remove the remaining femoral head without penetrating into the acetabulum or damaging the cartilage.  The  acetabulum was then carefully irrigated to remove any bony fragments or remaining debris.  The femoral head was then measured and trial balls were used to assess the size of the acetabulum.  The acetabulum was  found to accommodate a size 43mm head with a good suction cup fit and no overstuffing.  Attention was turned back to the femur and a femoral neck retractor was placed.  The femur was opened with a box osteotome and canal finder.  The femur was then sequentially broached up to a size 3 broach which allowed for good fit and fill.  A calcar planer was then used to smooth out the calcar.  The broach was removed and femoral prep was begun for cement. A lap was placed within the acetabulum to protect from any cement.  A canal restrictor was then placed distally within the femoral canal.  Anesthesia was alerted that we were going to begin femoral preparation and cementation.  The canal was irrigated with copious saline via long tip pulsatile lavage.  The canal was then scrubbed with a brush reirrigated and aspirated.  A suction drying sponge was then placed within the canal and hooked to suction while the cement was being prepared.  A cement gun was prepared and the canal was carefully filled with cement and then pressurized.  The implant was pressed into the cement mantle with care taken to maintain an appropriate version of approximately 15 degrees and tapped into place.  Excess cement was carefully removed and the implant was held in place until the cement fully cured.  After full curing of the cement the acetabulum was reirrigated and ensured to be clear of any debris.  A trial ball was placed on the femoral component reduced and taken through range of motion.  The hip was found to be stable and reduction with a full range of motion without subluxation or dislocation with equal leg lengths.  The hip was brought up into 90 degrees of flexion and internal rotation and was stable up to 85 degrees.  The hip was fully stable in sleeper position and to external rotation and extension.  The hip was then carefully dislocated the trial ball was removed and a real bipolar component was impacted into place and checked for  stability after cleaning the trunnion. The acetabulum was irrigated and the hip was reduced.  The hip showed good range of motion and stability on testing with both stability in flexion internal rotation and extension external rotation.  The hip was then irrigated with betadine based surgiphor solution and pulsatile lavage with saline.  A 2 mm drill bit was then used to make 1 hole in the greater trochanter the piriformis and capsular tissues were reapproximated and passed through the drill hole and a soft tissue bridge and tied over the greater trochanter.  The fascia was then approximated with #1 Vicryl and #2 barbed suture.  The subcutaneous tissues and skin were closed with 0 Vicryl 2-0 Vicryl and 3-0 V lock suture and the skin closed with Dermabond.  A sterile dressing was then applied.  Lap, sharps, and sponge counts were correct at the end of the case.   The patient was then rolled supine and an x-ray was taken in the operating room. Leg lengths were clinically equal on examination with a good distal pulse. Components appeared in good position with no fractures noted on x-ray.  Patient was then transferred to a hospital bed and transferred to the recovery room in stable condition.

## 2023-05-08 NOTE — TOC Progression Note (Signed)
Transition of Care [facility]) - Progression Note    Patient Details  Name: Deborah Velazquez MRN: [id number] Date of Birth: 18-Sep-1946  Transition of [facility]) CM/SW Contact  Marlowe Sax, RN Phone Number: 05/08/2023, 9:51 AM  Clinical Narrative:     The patient comes from home where at baseline she walks with a rolling walker, Her POA Is [name], she has a round the clock caregiver, TOC to follow and assist with DC planning and needs Surgery planned for today  Expected Discharge Plan:  (TBD) Barriers to Discharge: Continued Medical Work up  Expected Discharge Plan and Services   Discharge Planning Services: CM Consult   Living arrangements for the past 2 months: Single Family Home                                       Social Determinants of Health (SDOH) Interventions SDOH Screenings   Food Insecurity: No Food Insecurity (05/07/2023)  Housing: Low Risk  (05/07/2023)  Transportation Needs: No Transportation Needs (05/07/2023)  Utilities: Not At Risk (05/07/2023)  Depression (PHQ2-9): High Risk (04/08/2023)  Financial Resource Strain: Low Risk  (06/22/2020)  Social Connections: Unknown (06/22/2020)  Stress: No Stress Concern Present (06/16/2019)  Tobacco Use: Low Risk  (05/07/2023)    Readmission Risk Interventions     No data to display

## 2023-05-08 NOTE — Interval H&P Note (Signed)
Patient history and physical updated. Consent reviewed including risks, benefits, and alternatives to surgery with patient POA Odella Aquas. She agrees with above plan to proceed with left hip cemented hemiarthroplasty.

## 2023-05-08 NOTE — Transfer of Care (Signed)
Immediate Anesthesia Transfer of Care Note  Patient: Deborah Velazquez  Procedure(s) Performed: ARTHROPLASTY BIPOLAR HIP (HEMIARTHROPLASTY) (Left: Hip)  Patient Location: PACU  Anesthesia Type:Spinal  Level of Consciousness: drowsy and responds to stimulation  Airway & Oxygen Therapy: Patient Spontanous Breathing and Patient connected to face mask oxygen  Post-op Assessment: Report given to RN and Post -op Vital signs reviewed and stable  Post vital signs: Reviewed and stable  Last Vitals:  Vitals Value Taken Time  BP 117/68 05/08/23 1542  Temp    Pulse 80 05/08/23 1545  Resp 14 05/08/23 1545  SpO2 97 % 05/08/23 1545  Vitals shown include unvalidated device data.  Last Pain:  Vitals:   05/08/23 1218  TempSrc: Temporal  PainSc:       Patients Stated Pain Goal: 2 (05/07/23 2102)  Complications: No notable events documented.

## 2023-05-08 NOTE — Anesthesia Procedure Notes (Signed)
Spinal  Patient location during procedure: OR Start time: 05/08/2023 1:24 PM End time: 05/08/2023 1:30 PM Reason for block: surgical anesthesia Staffing Performed: anesthesiologist  Anesthesiologist: Reed Breech, MD Performed by: Reed Breech, MD Authorized by: Reed Breech, MD   Preanesthetic Checklist Completed: patient identified, IV checked, site marked, risks and benefits discussed, surgical consent, monitors and equipment checked, pre-op evaluation and timeout performed Spinal Block Patient position: right lateral decubitus Prep: ChloraPrep Patient monitoring: heart rate, cardiac monitor, continuous pulse ox and blood pressure Approach: midline Location: L2-3 Injection technique: single-shot Needle Needle type: Whitacre  Needle gauge: 22 G Needle length: 9 cm Assessment Sensory level: T4 Events: CSF return Additional Notes Two attempts; initial attempt via right paramedian unsuccessful (thought I was midline).  Second attempt midline at L2-3 successful.

## 2023-05-09 ENCOUNTER — Encounter: Payer: Self-pay | Admitting: Orthopedic Surgery

## 2023-05-09 DIAGNOSIS — N3000 Acute cystitis without hematuria: Secondary | ICD-10-CM

## 2023-05-09 LAB — BASIC METABOLIC PANEL
Anion gap: 10 (ref 5–15)
BUN: 11 mg/dL (ref 8–23)
CO2: 23 mmol/L (ref 22–32)
Calcium: 8.7 mg/dL — ABNORMAL LOW (ref 8.9–10.3)
Chloride: 104 mmol/L (ref 98–111)
Creatinine, Ser: 0.67 mg/dL (ref 0.44–1.00)
GFR, Estimated: >60 mL/min (ref >60)
Glucose, Bld: 130 mg/dL — ABNORMAL HIGH (ref 70–99)
Potassium: 3.7 mmol/L (ref 3.5–5.1)
Sodium: 137 mmol/L (ref 135–145)

## 2023-05-09 LAB — GLUCOSE, CAPILLARY
Glucose-Capillary: 144 mg/dL — ABNORMAL HIGH (ref 70–99)
Glucose-Capillary: 152 mg/dL — ABNORMAL HIGH (ref 70–99)

## 2023-05-09 LAB — CBC
HCT: 35.2 % — ABNORMAL LOW (ref 36.0–46.0)
Hemoglobin: 11.4 g/dL — ABNORMAL LOW (ref 12.0–15.0)
MCH: 29.8 pg (ref 26.0–34.0)
MCHC: 32.4 g/dL (ref 30.0–36.0)
MCV: 92.1 fL (ref 80.0–100.0)
Platelets: 175 10*3/uL (ref 150–400)
RBC: 3.82 MIL/uL — ABNORMAL LOW (ref 3.87–5.11)
RDW: 14.6 % (ref 11.5–15.5)
WBC: 11.5 10*3/uL — ABNORMAL HIGH (ref 4.0–10.5)
nRBC: 0 % (ref 0.0–0.2)

## 2023-05-09 LAB — URINE CULTURE

## 2023-05-09 MED ORDER — SODIUM CHLORIDE 0.9 % IV SOLN
1.0000 g | INTRAVENOUS | Status: DC
  Administered 2023-05-09 – 2023-05-11 (×3): 1 g via INTRAVENOUS
  Filled 2023-05-09 (×4): qty 10

## 2023-05-09 MED ORDER — SODIUM CHLORIDE 0.9 % IV SOLN: INTRAVENOUS | Status: AC

## 2023-05-09 NOTE — Progress Notes (Addendum)
   Subjective: 1 Day Post-Op Procedure(s) (LRB): ARTHROPLASTY BIPOLAR HIP (HEMIARTHROPLASTY) (Left) Pain controlled. Daughter at bedside. States patient at baseline. Patient family is well, and has had no acute complaints or problems We will continue therapy today.  Plan is to go Home after hospital stay.  Objective: Vital signs in last 24 hours: Temp:  [97 F (36.1 C)-100.2 F (37.9 C)] 99 F (37.2 C) (06/14 0558) Pulse Rate:  [73-96] 96 (06/14 0558) Resp:  [11-20] 16 (06/14 0558) BP: (84-140)/(62-78) 92/62 (06/14 0558) SpO2:  [90 %-99 %] 94 % (06/14 0558)  Intake/Output from previous day: 06/13 0701 - 06/14 0700 In: 1880.3 [P.O.:480; I.V.:800; IV Piggyback:600.3] Out: 1500 [Urine:1250; Blood:250] Intake/Output this shift: No intake/output data recorded.  Recent Labs    05/07/23 1049 05/08/23 0429 05/09/23 0548  HGB 12.5 12.4 11.4*   Recent Labs    05/08/23 0429 05/09/23 0548  WBC 8.0 11.5*  RBC 4.12 3.82*  HCT 37.4 35.2*  PLT 188 175   Recent Labs    05/08/23 0429 05/09/23 0548  NA 137 137  K 3.7 3.7  CL 105 104  CO2 24 23  BUN 9 11  CREATININE 0.55 0.67  GLUCOSE 107* 130*  CALCIUM 8.9 8.7*   No results for input(s): "LABPT", "INR" in the last 72 hours.  EXAM General - Patient is  sleeping.  Extremity - Intact pulses distally Dorsiflexion/Plantar flexion intact No cellulitis present Compartment soft Dressing - dressing C/D/I and no drainage  Past Medical History:  Diagnosis Date   Anxiety    Arthritis    Hypercholesterolemia    Hyperlipemia    Irritable bowel syndrome    Parkinson disease    Pneumonia due to human metapneumovirus 03/18/2023   Restless leg syndrome    RSD (reflex sympathetic dystrophy)    pt denies   Stroke (HCC)    TIA's    Assessment/Plan:   1 Day Post-Op Procedure(s) (LRB): ARTHROPLASTY BIPOLAR HIP (HEMIARTHROPLASTY) (Left) Active Problems:   Anxiety   Cognitive impairment   Parkinson's disease   Pure  hypercholesterolemia   Preoperative clearance   Lewy body dementia (HCC)   History of TIA (transient ischemic attack)   Closed fracture of neck of left femur (HCC)  Estimated body mass index is 26.12 kg/m as calculated from the following:   Height as of this encounter: 5\' 5"  (1.651 m).   Weight as of this encounter: 71.2 kg. Advance diet Labs and VSS Pain controlled CM to assist with discharge to home with HHPT. Patient has 24/7 care at home. Family feels comfortable with patient returning home once she is stable and back to baseline.    DVT Prophylaxis - Lovenox, TED hose, and SCDs Weight-Bearing as tolerated to left leg   T. Cranston Neighbor, PA-C [facility] 05/09/2023, 8:11 AM   Patient seen and examined, agree with above plan.  The patient is doing well status post left hip hemiarthroplasty, no concerns at this time.  Pain is controlled.  Discussed DVT prophylaxis, pain medication use, and safe transition to home with patient's friend and caregiver.   Reinaldo Berber MD

## 2023-05-09 NOTE — Plan of Care (Signed)

## 2023-05-09 NOTE — Care Management Important Message (Signed)
Important Message  Patient Details  Name: Deborah Velazquez MRN: [id number] Date of Birth: January 02, 1946   Medicare Important Message Given:  Yes  I reviewed the Important Message from Medicare with the patient's Deborah Velazquez ([phone]) and she is aware of these rights. I thanked her for time.   [name] A Rosibel Giacobbe 05/09/2023, 2:44 PM

## 2023-05-09 NOTE — Evaluation (Signed)
Physical Therapy Evaluation Patient Details Name: Deborah Velazquez MRN: [id number] DOB: 1945/12/14 Today's Date: 05/09/2023  History of Present Illness  Pt is a 77 y.o. female with PMH consisting of: Parkinson's disease, Lewy body dementia, TIA, CVA, anxiety. Pt admitted to ED (6/12) with acute L hip pain after sustaining a fall at home while sleepwalking. MD assessment includes: closed fx of neck of left femur. Pt now s/p L hip cemented hemiarthroplasty (6/13).  Clinical Impression  Pt lying in bed with caregiver present upon arrival. Pt demonstrated confusion and high anxiety with movement likely secondary to history of dementia. She was able to answer questions regarding home setup independently with occasional corrections from the caregiver present. Pt on 3 L O2 upon arrival, with O2 at 95% and HR at 90 bpm. [place] removed during session with ok from nursing. Pt required max A + 2 for supine > sit and was unable to maintain sitting EOB without constant assistance. She demonstrated a high fear of falling in sitting EOB. Multiple sit-to-stands were attempted with max A + 2 but pt was unable to attain full standing position due to a heavy posterior and lateral lean to the R. Vitals at end of session with pt lying back in bed were HR 95 and O2 88, so [place] was placed back on the pt at 3 L, nursing made aware. Pt will benefit from continued PT services upon discharge to safely address deficits listed in patient problem list for decreased caregiver assistance and eventual return to PLOF.      Recommendations for follow up therapy are one component of a multi-disciplinary discharge planning process, led by the attending physician.  Recommendations may be updated based on patient status, additional functional criteria and insurance authorization.  Follow Up Recommendations Can patient physically be transported by private vehicle: No     Assistance Recommended at Discharge Frequent or constant  Supervision/Assistance  Patient can return home with the following  Two people to help with walking and/or transfers;Two people to help with bathing/dressing/bathroom;Help with stairs or ramp for entrance;Assist for transportation;Assistance with feeding;Assistance with cooking/housework;Direct supervision/assist for financial management;Direct supervision/assist for medications management    Equipment Recommendations None recommended by PT  Recommendations for Other Services       Functional Status Assessment Patient has had a recent decline in their functional status and/or demonstrates limited ability to make significant improvements in function in a reasonable and predictable amount of time     Precautions / Restrictions Precautions Precautions: Posterior Hip;Fall Precaution Booklet Issued: Yes (comment) Restrictions Weight Bearing Restrictions: Yes LLE Weight Bearing: Weight bearing as tolerated      Mobility  Bed Mobility Overal bed mobility: Needs Assistance Bed Mobility: Supine to Sit     Supine to sit: Max assist, +2 for physical assistance     General bed mobility comments: Pt unable to initiate bed mobility tasks with verbal cueing. Max A + 2 to come to sitting EOB. High anxiety with movement.    Transfers Overall transfer level: Needs assistance Equipment used: Rolling walker (2 wheels) Transfers: Sit to/from Stand Sit to Stand: Max assist, +2 physical assistance           General transfer comment: Pt able to initiate some forward momentum in sitting but required Max A + 2 to lift off bed, unable to attain full standing position even with PT assistance. Heavy posterior lean throughout STS.    Ambulation/Gait  General Gait Details: NT, unsafe.  Stairs            Wheelchair Mobility    Modified Rankin (Stroke Patients Only)       Balance Overall balance assessment: Needs assistance Sitting-balance support: Feet supported,  Bilateral upper extremity supported Sitting balance-Leahy Scale: Zero Sitting balance - Comments: Pt unable to tolerate sitting EOB without constant assistance. Heavy posterior lean with slight deviation to the R side. Postural control: Posterior lean, Right lateral lean   Standing balance-Leahy Scale: Zero Standing balance comment: Pt unable to attain full standing position, continued heavy posterior lean.                             Pertinent Vitals/Pain Pain Assessment Pain Assessment: 0-10 Pain Score: 1  Pain Location: L hip Pain Intervention(s): Monitored during session, Premedicated before session, Repositioned    Home Living Family/patient expects to be discharged to:: Private residence Living Arrangements: Spouse/significant other Available Help at Discharge: Family;Personal care attendant;Available 24 hours/day Type of Home: House Home Access: Ramped entrance       Home Layout: One level Home Equipment: Agricultural consultant (2 wheels);Rollator (4 wheels);BSC/3in1;Wheelchair - manual;Shower seat;Hospital bed Additional Comments: lift chair, PCA 24/7    Prior Function Prior Level of Function : Needs assist       Physical Assist : Mobility (physical);ADLs (physical)     Mobility Comments: RW household distances, w/c for community. No fall history except for most recent one resulting in L hip fx. ADLs Comments: PCA assists with ADLs     Hand Dominance   Dominant Hand: Right    Extremity/Trunk Assessment   Upper Extremity Assessment Upper Extremity Assessment: Generalized weakness    Lower Extremity Assessment Lower Extremity Assessment: Generalized weakness       Communication   Communication: No difficulties  Cognition Arousal/Alertness: Awake/alert Behavior During Therapy: Anxious Overall Cognitive Status: History of cognitive impairments - at baseline                                 General Comments: Hx of dementia. Pt  repeatedly asking for her mother, caregiver present says this is common. High anxiety with movement.        General Comments General comments (skin integrity, edema, etc.): Pt alert but confused throughout session. Frequently asks about her mother. Posterior hip precaution educated provided with patient/caregiver and handout provided.    Exercises     Assessment/Plan    PT Assessment Patient needs continued PT services  PT Problem List Decreased strength;Decreased cognition;Decreased activity tolerance;Decreased balance;Decreased safety awareness;Decreased knowledge of precautions;Decreased mobility       PT Treatment Interventions Therapeutic exercise;DME instruction;Gait training;Therapeutic activities;Patient/family education    PT Goals (Current goals can be found in the Care Plan section)  Acute Rehab PT Goals Patient Stated Goal: Get home PT Goal Formulation: With patient/family Time For Goal Achievement: 05/22/23 Potential to Achieve Goals: Fair    Frequency 7X/week     Co-evaluation               AM-PAC PT "6 Clicks" Mobility  Outcome Measure Help needed turning from your back to your side while in a flat bed without using bedrails?: A Lot Help needed moving from lying on your back to sitting on the side of a flat bed without using bedrails?: Total Help needed moving to and from a bed to  a chair (including a wheelchair)?: Total Help needed standing up from a chair using your arms (e.g., wheelchair or bedside chair)?: Total Help needed to walk in hospital room?: Total Help needed climbing 3-5 steps with a railing? : Total 6 Click Score: 7    End of Session Equipment Utilized During Treatment: Gait belt Activity Tolerance: Treatment limited secondary to medical complications (Comment) (Dementia) Patient left: in bed;with call Justice/phone within reach;with family/visitor present;with bed alarm set;with SCD's reapplied Nurse Communication: Mobility  status;Precautions;Weight bearing status PT Visit Diagnosis: Other abnormalities of gait and mobility (R26.89);Muscle weakness (generalized) (M62.81);Unsteadiness on feet (R26.81)    Time: 1610-9604 PT Time Calculation (min) (ACUTE ONLY): 37 min   Charges:              Raaga Maeder, SPT 05/09/23, 1:41 PM

## 2023-05-09 NOTE — Evaluation (Signed)
Occupational Therapy Evaluation Patient Details Name: Deborah Velazquez MRN: [id number] DOB: 10-11-1946 Today's Date: 05/09/2023   History of Present Illness Pt is a 77 y.o. female with PMH consisting of: Parkinson's disease, Lewy body dementia, TIA, CVA, anxiety. Pt admitted to ED (6/12) with acute L hip pain after sustaining a fall at home while sleepwalking. MD assessment includes: closed fx of neck of left femur. Pt now s/p L hip cemented hemiarthroplasty (6/13).   Clinical Impression   Patient received for OT evaluation. See flowsheet below for details of function. Generally, patient requiring MIN-MAX A for ADLs. Bed level assessment only today, as pt up with PT MAX A x2 earlier today with great increase in pain. Two of patient's caregivers present during session today and providing much of prior hx information.  Patient will benefit from continued OT while in acute care.       Recommendations for follow up therapy are one component of a multi-disciplinary discharge planning process, led by the attending physician.  Recommendations may be updated based on patient status, additional functional criteria and insurance authorization.   Assistance Recommended at Discharge Frequent or constant Supervision/Assistance  Patient can return home with the following Two people to help with walking and/or transfers;Two people to help with bathing/dressing/bathroom;Assistance with cooking/housework;Direct supervision/assist for medications management;Direct supervision/assist for financial management;Assist for transportation;Help with stairs or ramp for entrance    Functional Status Assessment  Patient has had a recent decline in their functional status and demonstrates the ability to make significant improvements in function in a reasonable and predictable amount of time.  Equipment Recommendations  None recommended by OT    Recommendations for Other Services       Precautions / Restrictions  Precautions Precautions: Posterior Hip;Fall Restrictions Weight Bearing Restrictions: Yes LLE Weight Bearing: Weight bearing as tolerated      Mobility Bed Mobility               General bed mobility comments: deferred; pt requiring x2 assist of OT and caregiver for scooting up to reposition in bed. Abductor pillow secured throughout session.    Transfers                   General transfer comment: deferred      Balance                                           ADL either performed or assessed with clinical judgement   ADL Overall ADL's : Needs assistance/impaired     Grooming: Minimal assistance;Moderate assistance;Bed level;Oral care;Wash/dry face Grooming Details (indicate cue type and reason): Pt taking toothbrush and starting to brush before toothpaste applied; pt requiring assist of OT to apply toothpaste (after OT gave her a chance to apply own toothpaste); pt brushed without assistance for approx 45 seconds. Able to spit in basin, rinse with straw (per caregivers she typically uses a straw), and spin appropriately with OT holding basin. OT provided wet washcloth for pt to wipe toothpaste from her chin; required caregiver assistance to complete task even with OT cues. Declined brushing hair today.                               General ADL Comments: Did not test functional mobility or bed mobility today, as pt requiring MAX A x2 with  PT earlier today for sitting EOB with significant increase in pain.  Anticipate pt to need total assist for LB dressing, t/f to toilet, and bathing at this time; anticipate MOD A for UB dressing and set up for eating.     Vision Ability to See in Adequate Light: 0 Adequate Patient Visual Report: No change from baseline       Perception     Praxis      Pertinent Vitals/Pain Pain Assessment Pain Assessment: PAINAD Breathing: normal Negative Vocalization: occasional moan/groan, low speech,  negative/disapproving quality Facial Expression: smiling or inexpressive Body Language: relaxed Consolability: no need to console PAINAD Score: 1 Pain Location: L hip, only with change in bed position (raise/lower HOB); appears short-lived and then pt with no further pain after body adjusts to new position Pain Intervention(s): Limited activity within patient's tolerance, Monitored during session     Hand Dominance Right   Extremity/Trunk Assessment Upper Extremity Assessment Upper Extremity Assessment: Generalized weakness (Pt able to flex shoulders to about 120 degrees slowly with OT encouragement and cues)   Lower Extremity Assessment Lower Extremity Assessment: Defer to PT evaluation;LLE deficits/detail LLE Deficits / Details: s/p L femur fx repair       Communication Communication Communication: No difficulties   Cognition Arousal/Alertness: Awake/alert Behavior During Therapy: Anxious Overall Cognitive Status: History of cognitive impairments - at baseline                                 General Comments: Hx of dementia. Able to identify caregivers at bedside by nickname (although misidentified one as a daughter instead of sister of the other); not oriented to time or situation. Responded well to caregivers. Able to answer OT's questions about function with 50% accuracy. Caregiver states that pt's cognition today is about 90% of her normal.     General Comments       Exercises     Shoulder Instructions      Home Living Family/patient expects to be discharged to:: Private residence Living Arrangements: Spouse/significant other;Other (Comment) (24/7 caregiver) Available Help at Discharge: Family;Personal care attendant;Available 24 hours/day Type of Home: House Home Access: Ramped entrance     Home Layout: One level     Bathroom Shower/Tub: Producer, television/film/video: Handicapped height Bathroom Accessibility: Yes How Accessible:  Accessible via wheelchair Home Equipment: Agricultural consultant (2 wheels);Rollator (4 wheels);BSC/3in1;Wheelchair - manual;Shower seat;Hospital bed;Hand held shower head;Grab bars - tub/shower;Shower seat - built in   Additional Comments: lift chair, rolling shower chair and built-in shower chair; PCA 24/7      Prior Functioning/Environment Prior Level of Function : Needs assist  Cognitive Assist : Mobility (cognitive);ADLs (cognitive) Mobility (Cognitive): Set up cues ADLs (Cognitive): Intermittent cues Physical Assist : Mobility (physical);ADLs (physical)   ADLs (physical): Bathing;Dressing;Toileting;IADLs Mobility Comments: RW household distances, w/c for community. No fall history except for most recent one resulting in L hip fx. ADLs Comments: Pt typically is able to stand at the sink for grooming with RW to get to sink; assist for toileting, bathing, dressing at baseline; caregivers available at all times to assist pt as needed.        OT Problem List: Decreased range of motion;Decreased activity tolerance;Impaired balance (sitting and/or standing);Decreased safety awareness;Decreased cognition      OT Treatment/Interventions: Self-care/ADL training;Therapeutic exercise;Therapeutic activities;Patient/family education    OT Goals(Current goals can be found in the care plan section) Acute Rehab OT Goals  Patient Stated Goal: Rest OT Goal Formulation: With family Time For Goal Achievement: 05/23/23 Potential to Achieve Goals: Fair ADL Goals Pt Will Perform Grooming: with supervision;standing Pt Will Transfer to Toilet: ambulating;bedside commode Pt Will Perform Toileting - Clothing Manipulation and hygiene: with supervision;sit to/from stand  OT Frequency: Min 1X/week    Co-evaluation              AM-PAC OT "6 Clicks" Daily Activity     Outcome Measure Help from another person eating meals?: A Little Help from another person taking care of personal grooming?: A Little Help  from another person toileting, which includes using toliet, bedpan, or urinal?: Total Help from another person bathing (including washing, rinsing, drying)?: Total Help from another person to put on and taking off regular upper body clothing?: A Little Help from another person to put on and taking off regular lower body clothing?: Total 6 Click Score: 12   End of Session Equipment Utilized During Treatment: Oxygen (pt on 3L O2; saturation 90% throughout session. Pt not on O2 at home.) Nurse Communication: Mobility status  Activity Tolerance: Patient limited by pain Patient left: in bed  OT Visit Diagnosis: Unsteadiness on feet (R26.81);Pain Pain - Right/Left: Left Pain - part of body: Hip                Time: 1914-7829 OT Time Calculation (min): 28 min Charges:  OT General Charges $OT Visit: 1 Visit OT Evaluation $OT Eval Moderate Complexity: 1 Mod OT Treatments $Self Care/Home Management : 8-22 mins  Linward Foster, MS, OTR/L  Amenah Graydon 05/09/2023, 4:32 PM

## 2023-05-09 NOTE — Progress Notes (Signed)
  Progress Note   Patient: Deborah Velazquez ZOX:[id number] DOB: Sep 22, 1946 DOA: 05/07/2023     2 DOS: the patient was seen and examined on 05/09/2023   Brief hospital course: ricilla KEARSTIN HINDES is a 77 y.o. female with medical history significant of Parkinson's disease using walker to ambulate, Lewy body dementia, HLD, IBS,, TIA, CVA, anxiety, who presented with left hip pain status fall while sleep walking. Hip x-ray showed left hip fracture.  Patient was admitted to hospitalist service with orthopedic surgery consultation. Left hemiarthroplasty done 05/08/23.   Assessment and Plan: Left hip fracture s/p fall Left hip cemented hemiarthroplasty 05/08/23. Mild drop in H/H transient noted.  Continue pain control. PT/ OT evaluation.  UTI- Cultures reviewed shows gram negative rods. Will start Rocephin therapy. Follow sensitivities.  Prior history of CVA-stable History of Parkinson disease with cognitive impairment History of Lewy body dementia- Follows neurology as outpatient. Continue carbidopa-levodopa and other home medications. Continue anxiolytics. Nursing supportive care.  DVT prophylaxis-SCD. Fall, aspiration precautions. CODE STATUS full code      Subjective: Patient is seen and examined today morning.  She is awake, talking to her friend at bedside.  Has pain over surgery site. Eating fair. States she worked with PT today.  Physical Exam: Vitals:   05/09/23 0558 05/09/23 0814 05/09/23 1020 05/09/23 1535  BP: 92/62 (!) 81/55 100/64 (!) 96/59  Pulse: 96 91  91  Resp: 16 18  18   Temp: 99 F (37.2 C) 99.7 F (37.6 C)  (!) 101 F (38.3 C)  TempSrc:      SpO2: 94% 90%  94%  Weight:      Height:       General - Elderly Caucasian female, sleeping, no apparent distress HEENT - PERRLA, EOMI, atraumatic head, non tender sinuses. Lung - Clear, diffuse rales. Heart - S1, S2 heard, no murmurs, rubs, trace pedal edema Neuro -lethargic, unable to follow commands. Skin - Warm and  dry. Left hip surgery site clean Data Reviewed:  CBC, CMP, urine culture  Family Communication: Patient's daughter at bedside, informed about the plan, all questions answered.  Disposition: Status is: Inpatient Remains inpatient appropriate because: post orthopedic surgery intervention, PT eval for safe discharge plan  Planned Discharge Destination: Home with Home Health    Time spent: 43 minutes  Author: Marcelino Duster, MD 05/09/2023 5:13 PM  For on call review [email or web address].

## 2023-05-09 NOTE — TOC Progression Note (Signed)
Transition of Care [facility]) - Progression Note    Patient Details  Name: Deborah Velazquez MRN: [id number] Date of Birth: 1946-08-19  Transition of Care [facility]) CM/SW Contact  Marlowe Sax, RN Phone Number: 05/09/2023, 2:49 PM  Clinical Narrative:    Spoke with the [name] She stated that they will not be going to [facility] SN[facility]< They will take care of the patient at home, she has 24/7 paid caregivers thru [facility] She has all DME needed at home, Pearl asked about the purewick, I explained that insurance does not cover it but they can pay out of pocket and order it online, she stated understanding She stated that the patient's caregivers are coming to the hospital round the clock to advocate for the patient  The patient will need to have EMS to take her home, [name] stated she feels like it would not be until Monday.  I explained that once she is medically ready they would DC  I sent the [facility] referral to [facility] accepted the patient   Expected Discharge Plan: Home w Home Health Services Barriers to Discharge: Continued Medical Work up  Expected Discharge Plan and Services   Discharge Planning Services: CM Consult   Living arrangements for the past 2 months: Single Family Home                 DME Arranged: N/A DME Agency: [employer]       HH Arranged: PT, OT HH Agency: [facility] Date HH Agency Contacted: 05/09/23 Time HH Agency Contacted: [id number] Representative spoke with at [facility] Agency: [facility]   Social Determinants of Health (SDOH) Interventions SDOH Screenings   Food Insecurity: No Food Insecurity (05/07/2023)  Housing: Low Risk  (05/07/2023)  Transportation Needs: No Transportation Needs (05/07/2023)  Utilities: Not At Risk (05/07/2023)  Depression (PHQ2-9): High Risk (04/08/2023)  Financial Resource Strain: Low Risk  (06/22/2020)  Social Connections: Unknown (06/22/2020)  Stress: No Stress Concern Present (06/16/2019)  Tobacco Use: Low Risk   (05/09/2023)    Readmission Risk Interventions     No data to display

## 2023-05-10 ENCOUNTER — Other Ambulatory Visit: Payer: Self-pay | Admitting: Internal Medicine

## 2023-05-10 DIAGNOSIS — B961 Klebsiella pneumoniae [K. pneumoniae] as the cause of diseases classified elsewhere: Secondary | ICD-10-CM

## 2023-05-10 DIAGNOSIS — N390 Urinary tract infection, site not specified: Secondary | ICD-10-CM

## 2023-05-10 DIAGNOSIS — E7800 Pure hypercholesterolemia, unspecified: Secondary | ICD-10-CM

## 2023-05-10 DIAGNOSIS — B9689 Other specified bacterial agents as the cause of diseases classified elsewhere: Secondary | ICD-10-CM

## 2023-05-10 HISTORY — DX: Urinary tract infection, site not specified: N39.0

## 2023-05-10 LAB — CBC
HCT: 30.6 % — ABNORMAL LOW (ref 36.0–46.0)
Hemoglobin: 9.7 g/dL — ABNORMAL LOW (ref 12.0–15.0)
MCH: 29.8 pg (ref 26.0–34.0)
MCHC: 31.7 g/dL (ref 30.0–36.0)
MCV: 94.2 fL (ref 80.0–100.0)
Platelets: 165 10*3/uL (ref 150–400)
RBC: 3.25 MIL/uL — ABNORMAL LOW (ref 3.87–5.11)
RDW: 14.6 % (ref 11.5–15.5)
WBC: 10.8 10*3/uL — ABNORMAL HIGH (ref 4.0–10.5)
nRBC: 0 % (ref 0.0–0.2)

## 2023-05-10 LAB — BASIC METABOLIC PANEL
Anion gap: 9 (ref 5–15)
BUN: 12 mg/dL (ref 8–23)
CO2: 24 mmol/L (ref 22–32)
Calcium: 8.3 mg/dL — ABNORMAL LOW (ref 8.9–10.3)
Chloride: 103 mmol/L (ref 98–111)
Creatinine, Ser: 0.65 mg/dL (ref 0.44–1.00)
GFR, Estimated: >60 mL/min (ref >60)
Glucose, Bld: 130 mg/dL — ABNORMAL HIGH (ref 70–99)
Potassium: 3.8 mmol/L (ref 3.5–5.1)
Sodium: 136 mmol/L (ref 135–145)

## 2023-05-10 LAB — URINE CULTURE: Culture: >=100000 — AB

## 2023-05-10 MED ORDER — HYDROCODONE-ACETAMINOPHEN 5-325 MG PO TABS: 0.5000 | ORAL_TABLET | Freq: Four times a day (QID) | ORAL | 0 refills | Status: DC | PRN

## 2023-05-10 MED ORDER — ENOXAPARIN SODIUM 40 MG/0.4ML IJ SOSY: 40.0000 mg | PREFILLED_SYRINGE | INTRAMUSCULAR | 0 refills | Status: DC

## 2023-05-10 NOTE — Progress Notes (Addendum)
   Subjective: 2 Days Post-Op Procedure(s) (LRB): ARTHROPLASTY BIPOLAR HIP (HEMIARTHROPLASTY) (Left) Patient doing well this morning.  No complaints.  Pain well-controlled.  Family at bedside. We will continue therapy today.  Plan is to go Home after hospital stay, Monday.  Objective: Vital signs in last 24 hours: Temp:  [97.9 F (36.6 C)-101 F (38.3 C)] 99.7 F (37.6 C) (06/15 0017) Pulse Rate:  [91-92] 92 (06/15 0017) Resp:  [16-18] 16 (06/15 0017) BP: (96-104)/(59-70) 104/70 (06/15 0017) SpO2:  [94 %-98 %] 98 % (06/15 0017)  Intake/Output from previous day: 06/14 0701 - 06/15 0700 In: 1120 [P.O.:540; I.V.:580] Out: 400 [Urine:400] Intake/Output this shift: No intake/output data recorded.  Recent Labs    05/07/23 1049 05/08/23 0429 05/09/23 0548 05/10/23 0619  HGB 12.5 12.4 11.4* 9.7*   Recent Labs    05/09/23 0548 05/10/23 0619  WBC 11.5* 10.8*  RBC 3.82* 3.25*  HCT 35.2* 30.6*  PLT 175 165   Recent Labs    05/09/23 0548 05/10/23 0619  NA 137 136  K 3.7 3.8  CL 104 103  CO2 23 24  BUN 11 12  CREATININE 0.67 0.65  GLUCOSE 130* 130*  CALCIUM 8.7* 8.3*   No results for input(s): "LABPT", "INR" in the last 72 hours.  EXAM General - Patient is  sleeping.  Extremity - Intact pulses distally Dorsiflexion/Plantar flexion intact No cellulitis present Compartment soft Dressing - dressing C/D/I and no drainage  Past Medical History:  Diagnosis Date   Anxiety    Arthritis    Hypercholesterolemia    Hyperlipemia    Irritable bowel syndrome    Parkinson disease    Pneumonia due to human metapneumovirus 03/18/2023   Restless leg syndrome    RSD (reflex sympathetic dystrophy)    pt denies   Stroke (HCC)    TIA's    Assessment/Plan:   2 Days Post-Op Procedure(s) (LRB): ARTHROPLASTY BIPOLAR HIP (HEMIARTHROPLASTY) (Left) Active Problems:   Anxiety   Cognitive impairment   Parkinson's disease   Pure hypercholesterolemia   Preoperative  clearance   Lewy body dementia (HCC)   History of TIA (transient ischemic attack)   Closed fracture of neck of left femur (HCC)   Acute cystitis without hematuria  Estimated body mass index is 26.12 kg/m as calculated from the following:   Height as of this encounter: 5\' 5"  (1.651 m).   Weight as of this encounter: 71.2 kg. Advance diet Labs and VSS Pain controlled Work on bowel movement CM to assist with discharge to home with HHPT. Patient has 24/7 care at home. Family feels comfortable with patient returning home once she is stable and back to baseline.  Anticipate discharge to home Monday 6/17   DVT Prophylaxis - Lovenox, TED hose, and SCDs Weight-Bearing as tolerated to left leg   T. Cranston Neighbor, PA-C [facility] 05/10/2023, 9:11 AM  Patient seen and examined, agree with above plan.  The patient is doing well status post left hip hemiarthroplasty, no concerns at this time.  Pain is controlled.  Discussed DVT prophylaxis, pain medication use, and safe transition to home.  All questions answered the patient agrees with above plan.  Reinaldo Berber MD

## 2023-05-10 NOTE — Progress Notes (Signed)
  Progress Note   Patient: Deborah Velazquez:[id number] DOB: [birth year] DOA: 05/07/2023     3 DOS: the patient was seen and examined on 05/10/2023   Brief hospital course: Deborah Velazquez is a 77 y.o. female with medical history significant of Parkinson's disease using walker to ambulate, Lewy body dementia, HLD, IBS,, TIA, CVA, anxiety, who presented with left hip pain status fall while sleep walking. Hip x-ray showed left hip fracture.  Patient was admitted to hospitalist service with orthopedic surgery consultation. Left hemiarthroplasty done 05/08/23.   Assessment and Plan: Left hip fracture s/p fall Left hip cemented hemiarthroplasty 05/08/23. Drop in H/H TO 9.7 noted possibly related to procedure.  No need for transfusion. No active bleeding. Continue pain control. PT/ OT follow up  Klebsiella UTI- Cultures reviewed shows Klebsiella, proteus. Sensitivities reviewed. Continue Rocephin therapy.  Prior history of CVA-stable History of Parkinson disease with cognitive impairment History of Lewy body dementia- Follows neurology as outpatient. Continue carbidopa-levodopa and other home medications. Continue anxiolytics. Nursing supportive care.  DVT prophylaxis-SCD. Fall, aspiration precautions. CODE STATUS full code      Subjective: Patient is seen and examined today morning.  She is more awake, denies any pain. Daughter and her care taker at bedside. Advised incentive spirometry.  Eating fair. Encouraged to work with PT today.  Physical Exam: Vitals:   05/09/23 1020 05/09/23 1535 05/09/23 1648 05/10/23 0017  BP: 100/64 (!) 96/59  104/70  Pulse:  91  92  Resp:  18  16  Temp:  (!) 101 F (38.3 C) 97.9 F (36.6 C) 99.7 F (37.6 C)  TempSrc:      SpO2:  94%  98%  Weight:      Height:       General - Elderly Caucasian female, sleeping, no apparent distress HEENT - PERRLA, EOMI, atraumatic head, non tender sinuses. Lung - Clear, diffuse rales. Heart - S1, S2 heard, no  murmurs, rubs, trace pedal edema Neuro - Alert, awake and oriented, non focal exam. Skin - Warm and dry. Left hip surgery site clean Data Reviewed:  CBC, BMP, urine culture  Family Communication: Patient's daughter at bedside, informed about the plan, all questions answered.  Disposition: Status is: Inpatient Remains inpatient appropriate because: post orthopedic surgery intervention, PT eval for safe discharge plan  Planned Discharge Destination: Home with Home Health    Time spent: 43 minutes  Author: Marcelino Duster, MD 05/10/2023 5:08 PM  For on call review [email or web address].

## 2023-05-10 NOTE — Progress Notes (Signed)
PT Cancellation Note  Patient Details Name: Deborah Velazquez MRN: [id number] DOB: 02/21/1946   Cancelled Treatment:     PT attempt. Private hired caregivers at bedside on phone with one of pt's family members. Family requested Thereasa Parkin return once they arrive to hospital to observed PT session. Author will return around 9 am for PT session with caregiver education.    Rushie Chestnut 05/10/2023, 7:53 AM

## 2023-05-10 NOTE — Progress Notes (Signed)
Physical Therapy Treatment Patient Details Name: Deborah Velazquez MRN: [id number] DOB: [birth year] Today's Date: 05/10/2023   History of Present Illness Pt is a 77 y.o. female with PMH consisting of: Parkinson's disease, Lewy body dementia, TIA, CVA, anxiety. Pt admitted to ED (6/12) with acute L hip pain after sustaining a fall at home while sleepwalking. MD assessment includes: closed fx of neck of left femur. Pt now s/p L hip cemented hemiarthroplasty (6/13).    PT Comments    Pt was long sitting in bed with 2 of her personal caregivers (Has 24/7 caregivers at baseline) at bedside. Pt agrees to session is puts forth great effort during session but is severely limited. She continues to require +2 assistance for all mobility and transfers. Currently unable to ambulate. Author discussed level of assistance pt will most likely require at DC. They have mechanical hoyer lift at home however never have use it or been shown how to use it. Both pt and caregivers endorse willingness/plan to DC home even at current level of care. Acute PT will continue efforts to maximize pt's independence and abilities. She will greatly benefit from continued skilled PT at DC to maximize independence and safety with all ADLs.    Recommendations for follow up therapy are one component of a multi-disciplinary discharge planning process, led by the attending physician.  Recommendations may be updated based on patient status, additional functional criteria and insurance authorization.     Assistance Recommended at Discharge Frequent or constant Supervision/Assistance (will need 24/7 care)  Patient can return home with the following Two people to help with walking and/or transfers;Two people to help with bathing/dressing/bathroom;Assistance with cooking/housework;Assistance with feeding;Direct supervision/assist for medications management;Direct supervision/assist for financial management;Assist for transportation;Help with stairs  or ramp for entrance   Equipment Recommendations  Other (comment) (pt has hospital bed, hoyer lift, lift chair, BSC, transport chair, etc. no equipment needs identified.)       Precautions / Restrictions Precautions Precautions: Posterior Hip;Fall Precaution Booklet Issued: Yes (comment) Restrictions Weight Bearing Restrictions: Yes LLE Weight Bearing: Weight bearing as tolerated     Mobility  Bed Mobility Overal bed mobility: Needs Assistance Bed Mobility: Supine to Sit  Supine to sit: Total assist, +2 for safety/equipment, +2 for physical assistance, Max assist Sit to supine: Total assist, +2 for physical assistance, +2 for safety/equipment General bed mobility comments: pt required +2 assistance to safely acheive EOB short sit    Transfers Overall transfer level: Needs assistance Equipment used: Rolling walker (2 wheels) Transfers: Sit to/from Stand Sit to Stand: Total assist, Max assist, +2 physical assistance, +2 safety/equipment, From elevated surface  General transfer comment: +2 max assist to stand form EOB x 3-4 times. unable to stand erect with L knee in slight flexion throughout. pt stood ~ 1-2 minutes eacvh standing attempt. Pain and anxiety limiting    Ambulation/Gait    General Gait Details: currently unable   Balance Overall balance assessment: Needs assistance Sitting-balance support: Feet supported, Bilateral upper extremity supported Sitting balance-Leahy Scale: Zero Sitting balance - Comments: constant assistance required to maintain sitting balance   Standing balance support: Bilateral upper extremity supported, During functional activity, Reliant on assistive device for balance Standing balance-Leahy Scale: Zero Standing balance comment: constant +2 assistance to amintain standing balance      Cognition Arousal/Alertness: Awake/alert Behavior During Therapy: Anxious Overall Cognitive Status: History of cognitive impairments - at baseline       General Comments: Pt is alert and agreeable. does have some anxiety  with all mobility and transfers but caregivers able to assist with helping keep pt focused/relaxed           General Comments General comments (skin integrity, edema, etc.): Lengthy discussion about DC planning and equipment needs. Pt has 24/7 caregivers and planning to return home when cleared medically. Will need hoyer lift training in future if pt goes home at this level of function. Author encouraged RN staff to use mechanical lift for any all OOB activity.      Pertinent Vitals/Pain Pain Assessment Pain Assessment: 0-10 Pain Score: 6  Breathing: normal Negative Vocalization: occasional moan/groan, low speech, negative/disapproving quality Facial Expression: smiling or inexpressive Body Language: relaxed Consolability: distracted or reassured by voice/touch PAINAD Score: 2 Pain Location: BLEs Pain Intervention(s): Limited activity within patient's tolerance, Monitored during session, Premedicated before session, Repositioned     PT Goals (current goals can now be found in the care plan section) Acute Rehab PT Goals Patient Stated Goal: Get home Progress towards PT goals: Not progressing toward goals - comment (pt is limited by pain, anxiety, hip fx, and parkinsons dx)    Frequency    7X/week      PT Plan Current plan remains appropriate       AM-PAC PT "6 Clicks" Mobility   Outcome Measure  Help needed turning from your back to your side while in a flat bed without using bedrails?: A Lot Help needed moving from lying on your back to sitting on the side of a flat bed without using bedrails?: Total Help needed moving to and from a bed to a chair (including a wheelchair)?: Total Help needed standing up from a chair using your arms (e.g., wheelchair or bedside chair)?: Total Help needed to walk in hospital room?: Total Help needed climbing 3-5 steps with a railing? : Total 6 Click Score: 7    End  of Session Equipment Utilized During Treatment: Gait belt Activity Tolerance: Patient tolerated treatment well;Patient limited by pain;Patient limited by fatigue Patient left: in bed;with call Wasilewski/phone within reach;with family/visitor present;with bed alarm set;with SCD's reapplied Nurse Communication: Mobility status;Precautions;Weight bearing status PT Visit Diagnosis: Other abnormalities of gait and mobility (R26.89);Muscle weakness (generalized) (M62.81);Unsteadiness on feet (R26.81)     Time: 1610-9604 PT Time Calculation (min) (ACUTE ONLY): 40 min  Charges:  $Therapeutic Activity: 38-52 mins                     Jetta Lout PTA 05/10/23, 10:06 AM

## 2023-05-10 NOTE — Discharge Instructions (Signed)
Instructions after Posterior Hip Replacement        Dr. Regenia Skeeter., M.D.      Dept. of Orthopaedics & Sports Medicine  [facility]  [address]  [place], [place]  [address]  Phone: [phone]   Fax: [phone]    DIET: Drink plenty of non-alcoholic fluids. Resume your normal diet. Include foods high in fiber.  ACTIVITY:  You may use crutches or a walker with weight-bearing as tolerated, unless instructed otherwise. You may be weaned off of the walker or crutches by your Physical Therapist.  Do NOT reach below the level of your knees or cross your legs until allowed.    Continue doing gentle exercises. Exercising will reduce the pain and swelling, increase motion, and prevent muscle weakness.   Please continue to use the TED compression stockings for 2 weeks. You may remove the stockings at night, but should reapply them in the morning. Do not drive or operate any equipment until instructed.  WOUND CARE:  Continue to use ice packs periodically to reduce pain and swelling. You may shower with honeycomb dressing 3 days after surgery. Do not submerge incision site under water. Remove honeycomb dressing 7 days after surgery and allow dermabond to fall off on its own.   MEDICATIONS: You may resume your regular medications. Please take the pain medication as prescribed on the medication list. Do not take pain medication on an empty stomach. You have been given a prescription for a blood thinner to prevent blood clots. Please take the medication as instructed. (NOTE: After completing a 2 week course of Lovenox, take one Enteric-coated 81 mg aspirin twice a day for 3 additional weeks.) Pain medications and iron supplements can cause constipation. Use a stool softener (Senokot or Colace) on a daily basis and a laxative (dulcolax or miralax) as needed. Do not drive or drink alcoholic beverages when taking pain medications.   POSTOPERATIVE CONSTIPATION  PROTOCOL Constipation - defined medically as fewer than three stools per week and severe constipation as less than one stool per week.  One of the most common issues patients have following surgery is constipation.  Even if you have a regular bowel pattern at home, your normal regimen is likely to be disrupted due to multiple reasons following surgery.  Combination of anesthesia, postoperative narcotics, change in appetite and fluid intake all can affect your bowels.  In order to avoid complications following surgery, here are some recommendations in order to help you during your recovery period.  Colace (docusate) - Pick up an over-the-counter form of Colace or another stool softener and take twice a day as long as you are requiring postoperative pain medications.  Take with a full glass of water daily.  If you experience loose stools or diarrhea, hold the colace until you stool forms back up.  If your symptoms do not get better within 1 week or if they get worse, check with your doctor.  Dulcolax (bisacodyl) - Pick up over-the-counter and take as directed by the product packaging as needed to assist with the movement of your bowels.  Take with a full glass of water.  Use this product as needed if not relieved by Colace only.   MiraLax (polyethylene glycol) - Pick up over-the-counter to have on hand.  MiraLax is a solution that will increase the amount of water in your bowels to assist with bowel movements.  Take as directed and can mix with a glass of water, juice, soda, coffee, or tea.  Take if you go more than two days without a movement. Do not use MiraLax more than once per day. Call your doctor if you are still constipated or irregular after using this medication for 7 days in a row.  If you continue to have problems with postoperative constipation, please contact the office for further assistance and recommendations.  If you experience "the worst abdominal pain ever" or develop nausea or  vomiting, please contact the office immediatly for further recommendations for treatment.   CALL THE OFFICE FOR: Temperature above 101 degrees Excessive bleeding or drainage on the dressing. Excessive swelling, coldness, or paleness of the toes. Persistent nausea and vomiting.  FOLLOW-UP:  You should have an appointment to return to the office in 2 weeks after surgery. Arrangements have been made for continuation of Physical Therapy (either home therapy or outpatient therapy).

## 2023-05-10 NOTE — Plan of Care (Signed)

## 2023-05-11 DIAGNOSIS — F02B2 Dementia in other diseases classified elsewhere, moderate, with psychotic disturbance: Secondary | ICD-10-CM

## 2023-05-11 DIAGNOSIS — G3183 Dementia with Lewy bodies: Secondary | ICD-10-CM | POA: Diagnosis not present

## 2023-05-11 DIAGNOSIS — N3000 Acute cystitis without hematuria: Secondary | ICD-10-CM

## 2023-05-11 DIAGNOSIS — S72002A Fracture of unspecified part of neck of left femur, initial encounter for closed fracture: Secondary | ICD-10-CM | POA: Diagnosis not present

## 2023-05-11 LAB — BASIC METABOLIC PANEL
Anion gap: 8 (ref 5–15)
BUN: 10 mg/dL (ref 8–23)
CO2: 23 mmol/L (ref 22–32)
Calcium: 8.5 mg/dL — ABNORMAL LOW (ref 8.9–10.3)
Chloride: 104 mmol/L (ref 98–111)
Creatinine, Ser: 0.57 mg/dL (ref 0.44–1.00)
GFR, Estimated: >60 mL/min (ref >60)
Glucose, Bld: 118 mg/dL — ABNORMAL HIGH (ref 70–99)
Potassium: 3.6 mmol/L (ref 3.5–5.1)
Sodium: 135 mmol/L (ref 135–145)

## 2023-05-11 LAB — CBC
HCT: 30 % — ABNORMAL LOW (ref 36.0–46.0)
Hemoglobin: 9.7 g/dL — ABNORMAL LOW (ref 12.0–15.0)
MCH: 29.8 pg (ref 26.0–34.0)
MCHC: 32.3 g/dL (ref 30.0–36.0)
MCV: 92.3 fL (ref 80.0–100.0)
Platelets: 170 10*3/uL (ref 150–400)
RBC: 3.25 MIL/uL — ABNORMAL LOW (ref 3.87–5.11)
RDW: 14.6 % (ref 11.5–15.5)
WBC: 10.1 10*3/uL (ref 4.0–10.5)
nRBC: 0 % (ref 0.0–0.2)

## 2023-05-11 NOTE — Progress Notes (Signed)
  Progress Note   Patient: Deborah Velazquez GNF:[id number] DOB: 04-19-1946 DOA: 05/07/2023     4 DOS: the patient was seen and examined on 05/11/2023   Brief hospital course: ELLYN SHUMAKER is a 77 y.o. female with medical history significant of Parkinson's disease using walker to ambulate, Lewy body dementia, HLD, IBS,, TIA, CVA, anxiety, who presented with left hip pain status fall while sleep walking. Hip x-ray showed left hip fracture.  Patient was admitted to hospitalist service with orthopedic surgery consultation. Left hemiarthroplasty done 05/08/23.   Assessment and Plan: Left hip fracture s/p fall Left hip cemented hemiarthroplasty 05/08/23. Drop in H/H TO 9.7 noted possibly related to procedure.  No need for transfusion. No active bleeding. Continue pain control. PT/ OT follow up  Klebsiella UTI- Cultures reviewed shows Klebsiella, proteus. Sensitivities reviewed. Continue Rocephin therapy.  Prior history of CVA-stable History of Parkinson disease with cognitive impairment History of Lewy body dementia- Follows neurology as outpatient. Continue carbidopa-levodopa and other home medications. Continue anxiolytics. Nursing supportive care.  DVT prophylaxis-SCD. Fall, aspiration precautions. CODE STATUS full code      Subjective: Patient seen with caregiver and RN at bedside.  Pt sleeping this AM, not waking up to take meds.  Caregiver reports at baseline pt will sleep very hard for sometimes prolonged time, this is normal for her especially after a more active day beforehand.  Pt appears comfortable.  No acute events reported.  Physical Exam: Vitals:   05/10/23 1805 05/10/23 2353 05/11/23 0002 05/11/23 0727  BP: 113/77 122/61  114/82  Pulse: 87 86  79  Resp: 16 20  16   Temp: 98 F (36.7 C) (!) 100.6 F (38.1 C) 98.2 F (36.8 C) 99.8 F (37.7 C)  TempSrc: Oral  Oral   SpO2: 99% 96%  96%  Weight:      Height:       General exam: sleeping but responsive, no acute  distress HEENT: keeps eyes closed, moist mucus membranes, hearing grossly normal  Respiratory system: CTAB, no wheezes, rales or rhonchi, normal respiratory effort. Cardiovascular system: normal S1/S2, RRR,no pedal edema.   Gastrointestinal system: soft, NT, ND, no HSM felt, +bowel sounds. Central nervous system: unable to examine due to somnolence and pt not following commands, she does respond to stimulus Extremities: SCD's on BLE's, no edema, normal tone Skin: dry, intact, normal temperature Psychiatry: normal mood, congruent affect, judgement and insight appear normal    Data Reviewed:  Notable labs --- glucose 118, Ca 8.5 otherwise normal BMP.  Hbg stable at 9.7 x 2 (previously 11.4)   Family Communication: Caregiver at bedside  Disposition: Status is: Inpatient Remains inpatient appropriate because: post orthopedic surgery intervention, PT eval for safe discharge plan   Planned Discharge Destination: Home with Home Health     Time spent: 36 minutes  Author: Pennie Banter, DO 05/11/2023 12:57 PM  For on call review [email or web address].

## 2023-05-11 NOTE — Progress Notes (Signed)
Physical Therapy Treatment Patient Details Name: Deborah Velazquez MRN: [id number] DOB: 1946-07-30 Today's Date: 05/11/2023   History of Present Illness Pt is a 77 y.o. female with PMH consisting of: Parkinson's disease, Lewy body dementia, TIA, CVA, anxiety. Pt admitted to ED (6/12) with acute L hip pain after sustaining a fall at home while sleepwalking. MD assessment includes: closed fx of neck of left femur. Pt now s/p L hip cemented hemiarthroplasty (6/13).    PT Comments    Pt in bed, mostly asleep and unengaged in session.  Daughter and caregiver in and stated participation varies greatly day to day.  They have received a mechanical Hoyer type lift at home and are interested in training.  They are shown how to position sling appropriately under pt, how to set up lift and general safety features and concerns.  They are shown how to criss cross leg straps, and how to put sling on/off while sitting in chair.  Encouraged +2 assist at home with lift and to have HHPT/OT/nursing help with lift at home the first time or so as they learn the different features of their lift.  Repositioned ABD pillow as she continues to flex L knee and discussed probable need to reposition brace on occasion throughout the day and how to elevate heels to prevent pressure sores.  Family and caregiver reports feeling more comfortable with lift use and positioning.  Plan for discharge home with 24 hour support.   Recommendations for follow up therapy are one component of a multi-disciplinary discharge planning process, led by the attending physician.  Recommendations may be updated based on patient status, additional functional criteria and insurance authorization.  Follow Up Recommendations       Assistance Recommended at Discharge Frequent or constant Supervision/Assistance  Patient can return home with the following Two people to help with walking and/or transfers;Two people to help with  bathing/dressing/bathroom;Assistance with cooking/housework;Assistance with feeding;Direct supervision/assist for medications management;Direct supervision/assist for financial management;Assist for transportation;Help with stairs or ramp for entrance   Equipment Recommendations       Recommendations for Other Services       Precautions / Restrictions Precautions Precautions: Posterior Hip;Fall Precaution Booklet Issued: Yes (comment) Restrictions Weight Bearing Restrictions: Yes LLE Weight Bearing: Weight bearing as tolerated     Mobility  Bed Mobility Overal bed mobility: Needs Assistance Bed Mobility: Rolling Rolling: Total assist              Transfers                   General transfer comment: hoyer education with daughter and caregiver    Ambulation/Gait               General Gait Details: currently unable   Stairs             Wheelchair Mobility    Modified Rankin (Stroke Patients Only)       Balance                                            Cognition Arousal/Alertness: Lethargic Behavior During Therapy: WFL for tasks assessed/performed Overall Cognitive Status: History of cognitive impairments - at baseline                                 General  Comments: talking at times during session but generally confused and not engaging with session.  time spent with daughter and caregiver for education in room        Exercises Other Exercises Other Exercises: repositioned abd brace and education of caregivers for hoyer and safety in the home    General Comments        Pertinent Vitals/Pain Pain Assessment Pain Assessment: Faces Faces Pain Scale: Hurts even more Pain Location: BLEs with mobility Pain Descriptors / Indicators: Sore Pain Intervention(s): Limited activity within patient's tolerance, Monitored during session, Repositioned    Home Living                           Prior Function            PT Goals (current goals can now be found in the care plan section) Progress towards PT goals: Not progressing toward goals - comment    Frequency    7X/week      PT Plan Current plan remains appropriate    Co-evaluation              AM-PAC PT "6 Clicks" Mobility   Outcome Measure  Help needed turning from your back to your side while in a flat bed without using bedrails?: Total Help needed moving from lying on your back to sitting on the side of a flat bed without using bedrails?: Total Help needed moving to and from a bed to a chair (including a wheelchair)?: Total Help needed standing up from a chair using your arms (e.g., wheelchair or bedside chair)?: Total Help needed to walk in hospital room?: Total Help needed climbing 3-5 steps with a railing? : Total 6 Click Score: 6    End of Session   Activity Tolerance: Patient tolerated treatment well;Patient limited by lethargy Patient left: in bed;with call Boyd/phone within reach;with family/visitor present;with bed alarm set;with SCD's reapplied Nurse Communication: Mobility status;Precautions;Weight bearing status PT Visit Diagnosis: Other abnormalities of gait and mobility (R26.89);Muscle weakness (generalized) (M62.81);Unsteadiness on feet (R26.81)     Time: 1010-1040 PT Time Calculation (min) (ACUTE ONLY): 30 min  Charges:  $Therapeutic Activity: 23-37 mins                   Danielle Dess, PTA 05/11/23, 1:35 PM

## 2023-05-11 NOTE — Progress Notes (Addendum)
   Subjective: 3 Days Post-Op Procedure(s) (LRB): ARTHROPLASTY BIPOLAR HIP (HEMIARTHROPLASTY) (Left) Patient doing well this morning.  No complaints.  Pain well-controlled.  Family at bedside. We will continue therapy today.  Plan is to go Home after hospital stay, Monday.  Objective: Vital signs in last 24 hours: Temp:  [98 F (36.7 C)-100.6 F (38.1 C)] 99.8 F (37.7 C) (06/16 0727) Pulse Rate:  [79-87] 79 (06/16 0727) Resp:  [16-20] 16 (06/16 0727) BP: (113-122)/(61-82) 114/82 (06/16 0727) SpO2:  [96 %-99 %] 96 % (06/16 0727)  Intake/Output from previous day: 06/15 0701 - 06/16 0700 In: 340 [P.O.:240; IV Piggyback:100] Out: 350 [Urine:350] Intake/Output this shift: Total I/O In: -  Out: 400 [Urine:400]  Recent Labs    05/09/23 0548 05/10/23 0619 05/11/23 0542  HGB 11.4* 9.7* 9.7*   Recent Labs    05/10/23 0619 05/11/23 0542  WBC 10.8* 10.1  RBC 3.25* 3.25*  HCT 30.6* 30.0*  PLT 165 170   Recent Labs    05/10/23 0619 05/11/23 0542  NA 136 135  K 3.8 3.6  CL 103 104  CO2 24 23  BUN 12 10  CREATININE 0.65 0.57  GLUCOSE 130* 118*  CALCIUM 8.3* 8.5*   No results for input(s): "LABPT", "INR" in the last 72 hours.  EXAM General - Patient is  sleeping.  Extremity - Intact pulses distally Dorsiflexion/Plantar flexion intact No cellulitis present Compartment soft Small superficial blister superior to incision left hip. No drainage. No cellulitis. Dressing - dressing C/D/I and no drainage  Past Medical History:  Diagnosis Date   Anxiety    Arthritis    Hypercholesterolemia    Hyperlipemia    Irritable bowel syndrome    Parkinson disease    Pneumonia due to human metapneumovirus 03/18/2023   Restless leg syndrome    RSD (reflex sympathetic dystrophy)    pt denies   Stroke (HCC)    TIA's    Assessment/Plan:   3 Days Post-Op Procedure(s) (LRB): ARTHROPLASTY BIPOLAR HIP (HEMIARTHROPLASTY) (Left) Principal Problem:   Closed fracture of neck  of left femur (HCC) Active Problems:   Anxiety   Cognitive impairment   Parkinson's disease   Pure hypercholesterolemia   Preoperative clearance   Lewy body dementia (HCC)   History of TIA (transient ischemic attack)   Acute cystitis without hematuria   UTI due to Klebsiella species  Estimated body mass index is 26.12 kg/m as calculated from the following:   Height as of this encounter: 5\' 5"  (1.651 m).   Weight as of this encounter: 71.2 kg. Advance diet Labs and VSS Pain controlled Continue with ice left hip Work on bowel movement CM to assist with discharge to home with HHPT. Patient has 24/7 care at home. Family feels comfortable with patient returning home once she is stable and back to baseline.  Anticipate discharge to home Monday 6/17   DVT Prophylaxis - Lovenox, TED hose, and SCDs Weight-Bearing as tolerated to left leg   T. Cranston Neighbor, PA-C [facility] 05/11/2023, 9:24 AM  Patient seen and examined, agree with above plan.  The patient is doing well status post left hip hemiarthroplasty, no concerns at this time.  Pain is controlled.  Discussed DVT prophylaxis, pain medication use, and safe transition to home.  All questions answered, plan for dc home with HHPT.   Reinaldo Berber MD

## 2023-05-12 DIAGNOSIS — B9689 Other specified bacterial agents as the cause of diseases classified elsewhere: Secondary | ICD-10-CM

## 2023-05-12 DIAGNOSIS — N390 Urinary tract infection, site not specified: Secondary | ICD-10-CM | POA: Diagnosis not present

## 2023-05-12 LAB — CBC
HCT: 30.2 % — ABNORMAL LOW (ref 36.0–46.0)
Hemoglobin: 9.7 g/dL — ABNORMAL LOW (ref 12.0–15.0)
MCH: 29.7 pg (ref 26.0–34.0)
MCHC: 32.1 g/dL (ref 30.0–36.0)
MCV: 92.4 fL (ref 80.0–100.0)
Platelets: 204 10*3/uL (ref 150–400)
RBC: 3.27 MIL/uL — ABNORMAL LOW (ref 3.87–5.11)
RDW: 14.4 % (ref 11.5–15.5)
WBC: 8.5 10*3/uL (ref 4.0–10.5)
nRBC: 0 % (ref 0.0–0.2)

## 2023-05-12 LAB — SURGICAL PATHOLOGY

## 2023-05-12 MED ORDER — CEFDINIR 300 MG PO CAPS: 300.0000 mg | ORAL_CAPSULE | Freq: Two times a day (BID) | ORAL | 0 refills | Status: AC

## 2023-05-12 MED ORDER — DOCUSATE SODIUM 100 MG PO CAPS: 100.0000 mg | ORAL_CAPSULE | Freq: Two times a day (BID) | ORAL | 0 refills | Status: DC

## 2023-05-12 NOTE — Progress Notes (Signed)
EMS transported pt from [facility] to Home.  

## 2023-05-12 NOTE — Progress Notes (Addendum)
Patient was discharged and awaiting EMS for transportation. Patient's vital signs were reassessed. Patient's saturation decreased to 87% on room air. The pulse oximeter probe was placed on fingers of both the right and left hand, the left foot big toe, and her left ear lobe. The patient was pulled up in bed and asked to cough and then her saturation increased to 92%. The patient's family member stated she has a pulse oximeter at home. The patient's family member was educated on having the patient sit up in bed and to ask her to cough if she desaturates at home. She was also educated on calling 911 if the patient's mucous membranes turn blue or the patient becomes anxious while desaturating.

## 2023-05-12 NOTE — Progress Notes (Signed)
Physical Therapy Treatment Patient Details Name: Deborah Velazquez MRN: [id number] DOB: 1946-07-13 Today's Date: 05/12/2023   History of Present Illness Pt is a 77 y.o. female with PMH consisting of: Parkinson's disease, Lewy body dementia, TIA, CVA, anxiety. Pt admitted to ED (6/12) with acute L hip pain after sustaining a fall at home while sleepwalking. MD assessment includes: closed fx of neck of left femur. Pt now s/p L hip cemented hemiarthroplasty (6/13).    PT Comments    Pt lethargic awaiting discharge home.  Daughter in room and had questions regarding mobility and care at home.  Discussed and answered questions as appropriate for HEP, mobility and bedpan use.  Encouraged her to discuss and ask questions with [facility] team as they would be primary people to guide her once in the home.  Voiced understanding and increased comfort at end of session.   Recommendations for follow up therapy are one component of a multi-disciplinary discharge planning process, led by the attending physician.  Recommendations may be updated based on patient status, additional functional criteria and insurance authorization.  Follow Up Recommendations       Assistance Recommended at Discharge Frequent or constant Supervision/Assistance  Patient can return home with the following Two people to help with walking and/or transfers;Two people to help with bathing/dressing/bathroom;Assistance with cooking/housework;Assistance with feeding;Direct supervision/assist for medications management;Direct supervision/assist for financial management;Assist for transportation;Help with stairs or ramp for entrance   Equipment Recommendations       Recommendations for Other Services       Precautions / Restrictions Precautions Precautions: Posterior Hip;Fall Precaution Booklet Issued: Yes (comment) Restrictions Weight Bearing Restrictions: Yes LLE Weight Bearing: Weight bearing as tolerated     Mobility  Bed Mobility                     Transfers                        Ambulation/Gait                   Stairs             Wheelchair Mobility    Modified Rankin (Stroke Patients Only)       Balance                                            Cognition Arousal/Alertness: Lethargic Behavior During Therapy: WFL for tasks assessed/performed Overall Cognitive Status: History of cognitive impairments - at baseline                                          Exercises      General Comments        Pertinent Vitals/Pain Pain Assessment Pain Assessment: Faces Faces Pain Scale: Hurts little more Pain Location: BLEs with mobility Pain Descriptors / Indicators: Sore, Grimacing Pain Intervention(s): Monitored during session, Limited activity within patient's tolerance    Home Living                          Prior Function            PT Goals (current goals can now be found in the care plan section) Progress towards  PT goals: Not progressing toward goals - comment    Frequency    7X/week      PT Plan Current plan remains appropriate    Co-evaluation              AM-PAC PT "6 Clicks" Mobility   Outcome Measure  Help needed turning from your back to your side while in a flat bed without using bedrails?: Total Help needed moving from lying on your back to sitting on the side of a flat bed without using bedrails?: Total Help needed moving to and from a bed to a chair (including a wheelchair)?: Total Help needed standing up from a chair using your arms (e.g., wheelchair or bedside chair)?: Total Help needed to walk in hospital room?: Total Help needed climbing 3-5 steps with a railing? : Total 6 Click Score: 6    End of Session   Activity Tolerance: Patient tolerated treatment well;Patient limited by lethargy Patient left: in bed;with call Eleazer/phone within reach;with family/visitor present;with  bed alarm set;with SCD's reapplied Nurse Communication: Mobility status;Precautions;Weight bearing status PT Visit Diagnosis: Other abnormalities of gait and mobility (R26.89);Muscle weakness (generalized) (M62.81);Unsteadiness on feet (R26.81)     Time: 1335-1350 PT Time Calculation (min) (ACUTE ONLY): 15 min  Charges:  $Therapeutic Activity: 8-22 mins                  Danielle Dess, PTA 05/12/23, 2:52 PM

## 2023-05-12 NOTE — Discharge Summary (Signed)
Physician Discharge Summary   Patient: Deborah Velazquez MRN: [id number] DOB: January 02, 1946  Admit date:     05/07/2023  Discharge date: 05/12/2023  Discharge Physician: Pennie Banter   PCP: Sherlene Shams, MD   Recommendations at discharge:   Follow up with Orthopedic Surgery as scheduled Follow up with Primary Care in 1-2 weeks Repeat CBC, BMP in 1-2 weeks Weight-bearing as tolerated on left leg  Discharge Diagnoses: Principal Problem:   Closed fracture of neck of left femur (HCC) Active Problems:   Anxiety   Cognitive impairment   Parkinson's disease   Pure hypercholesterolemia   Preoperative clearance   Lewy body dementia (HCC)   History of TIA (transient ischemic attack)   Acute cystitis without hematuria   UTI due to Klebsiella species  Resolved Problems:   * No resolved hospital problems. *  Hospital Course:  Deborah Velazquez is a 77 y.o. female with medical history significant of Parkinson's disease using walker to ambulate, Lewy body dementia, HLD, IBS,, TIA, CVA, anxiety, who presented with left hip pain status fall while sleep walking. Hip x-ray showed left hip fracture.  Patient was admitted to hospitalist service with orthopedic surgery consultation. Left hemiarthroplasty done 05/08/23.   She was treated with IV antibiotics for UTI  6/17 -- medically stable for discharge home with [facility] services and 24/7 caregiver support already in place.   Assessment and Plan:  Left hip fracture s/p fall Left hip cemented hemiarthroplasty 05/08/23. Drop in H/H TO 9.7 noted possibly related to procedure.  No need for transfusion. No active bleeding. Continue pain control. PT/ OT follow up   Klebsiella UTI- Cultures reviewed shows Klebsiella, proteus. Sensitivities reviewed. Continue Rocephin therapy.   Prior history of CVA-stable History of Parkinson disease with cognitive impairment History of Lewy body dementia- Follows neurology as outpatient. Continue  carbidopa-levodopa and other home medications. Continue anxiolytics. Nursing supportive care.         Consultants: Orthopedic surgery Procedures performed: Left hip hemiarthroplasty   Disposition: Home health  Diet recommendation:  Regular diet  DISCHARGE MEDICATION: Allergies as of 05/12/2023       Reactions   Pseudoephedrine Hcl Anxiety, Other (See Comments)   Nervous   Sudafed [pseudoephedrine] Anxiety, Other (See Comments)   Nervous        Medication List     TAKE these medications    aspirin EC 81 MG tablet Take 81 mg by mouth daily. Swallow whole.   atorvastatin 10 MG tablet Commonly known as: LIPITOR Take 1 tablet (10 mg total) by mouth at bedtime.   carbidopa-levodopa 50-200 MG tablet Commonly known as: SINEMET CR Take 1 tablet by mouth 5 (five) times daily. Patient takes at 0700,1030,1400,1730,2200   cefdinir 300 MG capsule Commonly known as: OMNICEF Take 1 capsule (300 mg total) by mouth 2 (two) times daily for 2 days.   docusate sodium 100 MG capsule Commonly known as: COLACE Take 1 capsule (100 mg total) by mouth 2 (two) times daily.   DULoxetine 60 MG capsule Commonly known as: CYMBALTA Take 60 mg by mouth daily.   enoxaparin 40 MG/0.4ML injection Commonly known as: LOVENOX Inject 0.4 mLs (40 mg total) into the skin daily for 14 days.   estradiol 0.1 MG/GM vaginal cream Commonly known as: ESTRACE INSERT 1 APPLICATORFUL VAGINALLY 2 TIMES A WEEK AS DIRECTED   guaiFENesin-dextromethorphan 100-10 MG/5ML syrup Commonly known as: ROBITUSSIN DM Take 5 mLs by mouth every 4 (four) hours as needed for cough.   HYDROcodone-acetaminophen  5-325 MG tablet Commonly known as: NORCO/VICODIN Take 0.5-1 tablets by mouth every 6 (six) hours as needed for moderate pain (pain score 4-6).   hydrOXYzine 25 MG tablet Commonly known as: ATARAX Take 25 mg by mouth 2 (two) times daily as needed.   lamoTRIgine 100 MG tablet Commonly known as:  LAMICTAL Take 100 mg by mouth 2 (two) times daily.   nystatin powder Commonly known as: MYCOSTATIN/NYSTOP Apply 1 application. topically 2 (two) times daily. To rash until resolved.   OVER THE COUNTER MEDICATION Take 1 capsule by mouth in the morning and at bedtime. Doterra Life Long Vitality Pack   pregabalin 150 MG capsule Commonly known as: LYRICA Take 150 mg by mouth 2 (two) times daily.   QUEtiapine 25 MG tablet Commonly known as: SEROQUEL Take 0.5 tablets (12.5 mg total) by mouth at bedtime. What changed: when to take this   sertraline 100 MG tablet Commonly known as: ZOLOFT Take 200 mg by mouth daily.        Follow-up Information     Evon Slack, PA-C Follow up in 2 week(s).   Specialties: Orthopedic Surgery, Emergency Medicine Contact information: [address] [place] [address] [phone]                Discharge Exam: Ceasar Mons Weights   05/07/23 1045  Weight: 71.2 kg   General exam: awake, alert, no acute distress HEENT: moist mucus membranes, hearing grossly normal  Respiratory system: CTAB, no wheezes, rales or rhonchi, normal respiratory effort. Cardiovascular system: normal S1/S2, RRR   Gastrointestinal system: soft, NT, ND, no HSM felt, +bowel sounds. Central nervous system: A&O x self place. no gross focal neurologic deficits, normal speech Extremities: moves all , no edema, normal tone Skin: dry, intact, normal temperature, left hip surgical site without swelling, erythema or bleeding Psychiatry: normal mood, congruent affect   Condition at discharge: stable  The results of significant diagnostics from this hospitalization (including imaging, microbiology, ancillary and laboratory) are listed below for reference.   Imaging Studies: DG Pelvis Portable  Result Date: 05/08/2023 CLINICAL DATA:  Postop. EXAM: PORTABLE PELVIS 1-2 VIEWS COMPARISON:  Preoperative imaging. FINDINGS: Left hip arthroplasty in expected alignment. No  periprosthetic lucency or fracture. Recent postsurgical change includes air and edema in the soft tissues. IMPRESSION: Left hip arthroplasty without immediate postoperative complication. Electronically Signed   By: Narda Rutherford M.D.   On: 05/08/2023 15:57   DG FEMUR MIN 2 VIEWS LEFT  Result Date: 05/07/2023 CLINICAL DATA:  97979 Hip fracture (HCC) 97979 EXAM: LEFT FEMUR 2 VIEWS COMPARISON:  Same day hip radiograph FINDINGS: Redemonstrated displaced and angulated subcapital femoral neck fracture. Alignment grossly unchanged compared to same day prior. Normal alignment of the knee joint tibial plateau is normal in appearance. Osteopenia. No radiographically discernible soft tissue abnormality. Lumbar spinal fusion hardware is partially visualized. Assessment of the sacrum is limited due to overlying bowel gas. IMPRESSION: Redemonstrated displaced and angulated subcapital femoral neck fracture. Electronically Signed   By: Lorenza Cambridge M.D.   On: 05/07/2023 13:03   DG Chest Portable 1 View  Result Date: 05/07/2023 CLINICAL DATA:  Pain after fall EXAM: PORTABLE CHEST 1 VIEW COMPARISON:  X-ray 03/17/2023 FINDINGS: Underinflation. Developing right upper lobe opacity. Possible infiltrate. No pneumothorax, effusion or edema. Film is rotated to the right. Enlarged cardiopericardial silhouette with tortuous and ectatic aorta. Degenerative changes of the spine. Osteopenia IMPRESSION: Underinflated and rotated radiograph. Enlarged heart with tortuous ectatic aorta. Developing right upper lobe opacity.  Atelectasis versus infiltrate. Recommend follow-up Electronically Signed   By: Karen Kays M.D.   On: 05/07/2023 12:13   DG Hip Unilat W or Wo Pelvis 2-3 Views Left  Result Date: 05/07/2023 CLINICAL DATA:  Pain after fall EXAM: DG HIP (WITH OR WITHOUT PELVIS) 3V LEFT COMPARISON:  None Available. FINDINGS: Osteopenia. There is a subcapital left femoral neck fracture with overriding of fracture fragments and  angulation best depicted on the lateral view. Severe osteopenia. No additional fracture or dislocation. Osteophyte formation along both sacroiliac joints. Fixation hardware along the lower lumbar spine. Overlapping cardiac leads. There some well rounded densities in the pelvis which are indeterminate although possibly vascular. Pelvic view is rotated to the right. Hyperostosis. IMPRESSION: Displaced and angulated subcapital left femoral neck fracture. Osteopenia with degenerative changes. Electronically Signed   By: Karen Kays M.D.   On: 05/07/2023 12:12   CT Cervical Spine Wo Contrast  Result Date: 05/07/2023 CLINICAL DATA:  77 year old female status post fall last night. Pain. EXAM: CT CERVICAL SPINE WITHOUT CONTRAST TECHNIQUE: Multidetector CT imaging of the cervical spine was performed without intravenous contrast. Multiplanar CT image reconstructions were also generated. RADIATION DOSE REDUCTION: This exam was performed according to the departmental dose-optimization program which includes automated exposure control, adjustment of the mA and/or kV according to patient size and/or use of iterative reconstruction technique. COMPARISON:  Head CT today. FINDINGS: Alignment: Cervicothoracic junction alignment is within normal limits. Bilateral posterior element alignment is within normal limits. Mild degenerative appearing anterolisthesis of C4 on C5, with chronic facet degeneration there greater on the left. Mild straightening of lordosis otherwise. Skull base and vertebrae: Some generalized osteopenia. Visualized skull base is intact. No atlanto-occipital dissociation. C1 and C2 appear intact and aligned. No acute osseous abnormality identified. Soft tissues and spinal canal: No prevertebral fluid or swelling. No visible canal hematoma. Negative visible noncontrast neck soft tissues. Disc levels: Degenerative appearing facet ankylosis on the left at C5-C6. Adjacent segment spondylolisthesis and facet  arthropathy at C4-C5. Advanced disc and endplate degeneration also at the adjacent C6-C7 level. Up to mild associated cervical spinal stenosis. Upper chest: Osteopenia. Grossly intact visible upper thoracic levels. Nonspecific patchy ground-glass opacity in the upper lungs, somewhat greater on the right and associated with pleural thickening and/or scarring there. But this is stable compared to prior CTA 03/17/2023 (please see that report) IMPRESSION: 1. No acute traumatic injury identified in the cervical spine. 2. Osteopenia, and advanced cervical spine degeneration surrounding degenerative appearing ankylosis of C5-C6. 3. Unresolved apical lung opacity from an April chest CTA, thought to be infectious or inflammatory at that time. Electronically Signed   By: Odessa Fleming M.D.   On: 05/07/2023 11:44   CT HEAD WO CONTRAST ( )  Result Date: 05/07/2023 CLINICAL DATA:  77 year old female status post fall last night. Hip pain. EXAM: CT HEAD WITHOUT CONTRAST TECHNIQUE: Contiguous axial images were obtained from the base of the skull through the vertex without intravenous contrast. RADIATION DOSE REDUCTION: This exam was performed according to the departmental dose-optimization program which includes automated exposure control, adjustment of the mA and/or kV according to patient size and/or use of iterative reconstruction technique. COMPARISON:  Brain MRI 10/01/2021 and head CT 03/17/2023. FINDINGS: Brain: Chronic cerebral volume loss with possibly ex vacuo enlargement of the lateral ventricles does not appear significantly changed from the 2022 MRI. No midline shift, mass effect, evidence of mass lesion, intracranial hemorrhage or evidence of cortically based acute infarction. Moderate, Patchy and confluent white matter hypodensity with  bilateral deep gray matter nuclei heterogeneity appears stable from the previous MRI. Vascular: Calcified atherosclerosis at the skull base. No suspicious intracranial vascular  hyperdensity. Skull: No acute osseous abnormality identified. Sinuses/Orbits: Visualized paranasal sinuses and mastoids are clear. Other: No acute orbit or scalp soft tissue injury identified. IMPRESSION: 1. No acute traumatic injury identified. 2. Chronic ventriculomegaly and chronic small vessel disease appear not significantly changed from a 2022 MRI. Electronically Signed   By: Odessa Fleming M.D.   On: 05/07/2023 11:32    Microbiology: Results for orders placed or performed during the hospital encounter of 05/07/23  Urine Culture (for pregnant, neutropenic or urologic patients or patients with an indwelling urinary catheter)     Status: Abnormal   Collection Time: 05/07/23  3:46 PM   Specimen: Urine, Random  Result Value Ref Range Status   Specimen Description   Final    URINE, RANDOM Performed at [facility], [address] [place], [place] [address]    Special Requests   Final    NONE Performed at [facility], [address] [place], [place] [address]    Culture (A)  Final    >=100,000 COLONIES/mL KLEBSIELLA PNEUMONIAE 20,000 COLONIES/mL PROTEUS MIRABILIS    Report Status 05/10/2023 FINAL  Final   Organism ID, Bacteria KLEBSIELLA PNEUMONIAE (A)  Final   Organism ID, Bacteria PROTEUS MIRABILIS (A)  Final      Susceptibility   Klebsiella pneumoniae - MIC*    AMPICILLIN RESISTANT Resistant     CEFAZOLIN <=4 SENSITIVE Sensitive     CEFEPIME <=0.12 SENSITIVE Sensitive     CEFTRIAXONE <=0.25 SENSITIVE Sensitive     CIPROFLOXACIN <=0.25 SENSITIVE Sensitive     GENTAMICIN <=1 SENSITIVE Sensitive     IMIPENEM <=0.25 SENSITIVE Sensitive     NITROFURANTOIN 64 INTERMEDIATE Intermediate     TRIMETH/SULFA <=20 SENSITIVE Sensitive     AMPICILLIN/SULBACTAM <=2 SENSITIVE Sensitive     PIP/TAZO <=4 SENSITIVE Sensitive     * >=100,000 COLONIES/mL KLEBSIELLA PNEUMONIAE   Proteus mirabilis - MIC*    AMPICILLIN <=2 SENSITIVE Sensitive     CEFAZOLIN <=4 SENSITIVE Sensitive      CEFEPIME <=0.12 SENSITIVE Sensitive     CEFTRIAXONE <=0.25 SENSITIVE Sensitive     CIPROFLOXACIN <=0.25 SENSITIVE Sensitive     GENTAMICIN <=1 SENSITIVE Sensitive     IMIPENEM 2 SENSITIVE Sensitive     NITROFURANTOIN 128 RESISTANT Resistant     TRIMETH/SULFA <=20 SENSITIVE Sensitive     AMPICILLIN/SULBACTAM <=2 SENSITIVE Sensitive     PIP/TAZO <=4 SENSITIVE Sensitive     * 20,000 COLONIES/mL PROTEUS MIRABILIS    Labs: CBC: Recent Labs  Lab 05/07/23 1049 05/08/23 0429 05/09/23 0548 05/10/23 0619 05/11/23 0542 05/12/23 0441  WBC 9.6 8.0 11.5* 10.8* 10.1 8.5  NEUTROABS 6.4  --   --   --   --   --   HGB 12.5 12.4 11.4* 9.7* 9.7* 9.7*  HCT 39.0 37.4 35.2* 30.6* 30.0* 30.2*  MCV 93.3 90.8 92.1 94.2 92.3 92.4  PLT 213 188 175 165 170 204   Basic Metabolic Panel: Recent Labs  Lab 05/07/23 1049 05/08/23 0429 05/09/23 0548 05/10/23 0619 05/11/23 0542  NA 140 137 137 136 135  K 3.7 3.7 3.7 3.8 3.6  CL 106 105 104 103 104  CO2 23 24 23 24 23   GLUCOSE 113* 107* 130* 130* 118*  BUN 10 9 11 12 10   CREATININE 0.64 0.55 0.67 0.65 0.57  CALCIUM 9.0 8.9 8.7*  8.3* 8.5*   Liver Function Tests: Recent Labs  Lab 05/07/23 1049  AST 24  ALT 7  ALKPHOS 63  BILITOT 0.6  PROT 6.7  ALBUMIN 3.6   CBG: Recent Labs  Lab 05/09/23 1158 05/09/23 1556  GLUCAP 144* 152*    Discharge time spent: greater than 30 minutes.  Signed: Pennie Banter, DO [facility] 05/12/2023

## 2023-05-12 NOTE — Progress Notes (Addendum)
   Subjective: 4 Days Post-Op Procedure(s) (LRB): ARTHROPLASTY BIPOLAR HIP (HEMIARTHROPLASTY) (Left) Patient doing well this morning.  No complaints.  Pain well-controlled.  Family at bedside.  Slept most of the night.  Limited physical therapy because of resting yesterday. We will continue therapy today.  Plan is to go Home after hospital stay, probable today.  Objective: Vital signs in last 24 hours: Temp:  [97.9 F (36.6 C)-99.8 F (37.7 C)] 97.9 F (36.6 C) (06/16 2248) Pulse Rate:  [76-79] 76 (06/16 2248) Resp:  [16-18] 18 (06/16 2248) BP: (114-135)/(79-86) 129/79 (06/16 2248) SpO2:  [92 %-96 %] 92 % (06/16 2248)  Intake/Output from previous day: 06/16 0701 - 06/17 0700 In: 360 [P.O.:360] Out: 1000 [Urine:1000] Intake/Output this shift: No intake/output data recorded.  Recent Labs    05/10/23 0619 05/11/23 0542 05/12/23 0441  HGB 9.7* 9.7* 9.7*   Recent Labs    05/11/23 0542 05/12/23 0441  WBC 10.1 8.5  RBC 3.25* 3.27*  HCT 30.0* 30.2*  PLT 170 204   Recent Labs    05/10/23 0619 05/11/23 0542  NA 136 135  K 3.8 3.6  CL 103 104  CO2 24 23  BUN 12 10  CREATININE 0.65 0.57  GLUCOSE 130* 118*  CALCIUM 8.3* 8.5*   No results for input(s): "LABPT", "INR" in the last 72 hours.  EXAM General - Patient is  sleeping.  Extremity - Intact pulses distally Dorsiflexion/Plantar flexion intact No cellulitis present Compartment soft Small superficial blister superior to incision left hip. No drainage. No cellulitis. Dressing - dressing C/D/I and no drainage  Past Medical History:  Diagnosis Date   Anxiety    Arthritis    Hypercholesterolemia    Hyperlipemia    Irritable bowel syndrome    Parkinson disease    Pneumonia due to human metapneumovirus 03/18/2023   Restless leg syndrome    RSD (reflex sympathetic dystrophy)    pt denies   Stroke (HCC)    TIA's    Assessment/Plan:   4 Days Post-Op Procedure(s) (LRB): ARTHROPLASTY BIPOLAR HIP  (HEMIARTHROPLASTY) (Left) Principal Problem:   Closed fracture of neck of left femur (HCC) Active Problems:   Anxiety   Cognitive impairment   Parkinson's disease   Pure hypercholesterolemia   Preoperative clearance   Lewy body dementia (HCC)   History of TIA (transient ischemic attack)   Acute cystitis without hematuria   UTI due to Klebsiella species  Estimated body mass index is 26.12 kg/m as calculated from the following:   Height as of this encounter: 5\' 5"  (1.651 m).   Weight as of this encounter: 71.2 kg. Advance diet Labs and VSS Pain controlled Continue with ice left hip Work on bowel movement CM to assist with discharge to home with HHPT. Patient has 24/7 care at home. Family feels comfortable with patient returning home once she is stable and back to baseline.  Anticipate discharge to home today.   DVT Prophylaxis - Lovenox, TED hose, and SCDs Weight-Bearing as tolerated to left leg   Dedra Skeens, PA-C [facility] 05/12/2023, 7:16 AM  Patient seen and examined, agree with above plan.  The patient is doing well status post left hip hemiarthroplasty, no concerns at this time.  Pain is controlled.  Discussed DVT prophylaxis, pain medication use, and safe transition to home with family at bedside.  All questions answered the patient agrees with above plan.  Reinaldo Berber MD

## 2023-05-12 NOTE — Progress Notes (Signed)
Reviewed discharge instructions with family. Patient's family verbalized understanding. Patient left with abductor pillow and personal belongings. EMS transported patient to home via ambulance.

## 2023-05-12 NOTE — Care Management Important Message (Signed)
Important Message  Patient Details  Name: Deborah Velazquez MRN: [id number] Date of Birth: [birth year]   Medicare Important Message Given:  Yes  I reviewed the Important Message from Medicare with the patient's [name] [phone] again and she understand her rights. I thanked her for her time.   [name] [name] 05/12/2023, 10:49 AM

## 2023-05-12 NOTE — TOC Progression Note (Signed)
Transition of Care [facility]) - Progression Note    Patient Details  Name: Deborah Velazquez MRN: [id number] Date of Birth: Jun 11, 1946  Transition of Care [facility]) CM/SW Contact  Marlowe Sax, RN Phone Number: 05/12/2023, 11:57 AM  Clinical Narrative:     [name] at [place] of the [place] today EMS to transport to [address] [employer]  Expected Discharge Plan: Home w Home Health Services Barriers to Discharge: Continued Medical Work up  Expected Discharge Plan and Services   Discharge Planning Services: CM Consult   Living arrangements for the past 2 months: Single Family Home Expected Discharge Date: 05/12/23               DME Arranged: N/A DME Agency: [facility]       HH Arranged: PT, OT HH Agency: [facility] Date HH Agency Contacted: 05/09/23 Time HH Agency Contacted: [phone] Representative spoke with at [facility] Agency: Barbara Cower   Social Determinants of Health (SDOH) Interventions SDOH Screenings   Food Insecurity: No Food Insecurity (05/07/2023)  Housing: Low Risk  (05/07/2023)  Transportation Needs: No Transportation Needs (05/07/2023)  Utilities: Not At Risk (05/07/2023)  Depression (PHQ2-9): High Risk (04/08/2023)  Financial Resource Strain: Low Risk  (06/22/2020)  Social Connections: Unknown (06/22/2020)  Stress: No Stress Concern Present (06/16/2019)  Tobacco Use: Low Risk  (05/09/2023)    Readmission Risk Interventions     No data to display

## 2023-05-12 NOTE — TOC Progression Note (Signed)
Transition of Care [facility]) - Progression Note    Patient Details  Name: Deborah Velazquez MRN: [id number] Date of Birth: Feb 09, 1946  Transition of Care [facility]) CM/SW Contact  Marlowe Sax, RN Phone Number: 05/12/2023, 2:33 PM  Clinical Narrative:    Spoke with the POA [name] and confirmed that all is ready for the discharge home with EMS to transport, [facility] is set [facility] for [facility] EMS called to transport   Expected Discharge Plan: Home w Home Health Services Barriers to Discharge: Continued Medical Work up  Expected Discharge Plan and Services   Discharge Planning Services: CM Consult   Living arrangements for the past 2 months: Single Family Home Expected Discharge Date: 05/12/23               DME Arranged: N/A DME Agency: [facility]       HH Arranged: PT, OT HH Agency: [facility] Date HH Agency Contacted: 05/09/23 Time HH Agency Contacted: [phone] Representative spoke with at [facility] [place] Agency: Barbara Cower   Social Determinants of Health (SDOH) Interventions SDOH Screenings   Food Insecurity: No Food Insecurity (05/07/2023)  Housing: Low Risk  (05/07/2023)  Transportation Needs: No Transportation Needs (05/07/2023)  Utilities: Not At Risk (05/07/2023)  Depression (PHQ2-9): High Risk (04/08/2023)  Financial Resource Strain: Low Risk  (06/22/2020)  Social Connections: Unknown (06/22/2020)  Stress: No Stress Concern Present (06/16/2019)  Tobacco Use: Low Risk  (05/09/2023)    Readmission Risk Interventions     No data to display

## 2023-05-13 ENCOUNTER — Telehealth: Payer: Self-pay | Admitting: *Deleted

## 2023-05-13 DIAGNOSIS — Z993 Dependence on wheelchair: Secondary | ICD-10-CM | POA: Diagnosis not present

## 2023-05-13 DIAGNOSIS — G2581 Restless legs syndrome: Secondary | ICD-10-CM | POA: Diagnosis not present

## 2023-05-13 DIAGNOSIS — S72002A Fracture of unspecified part of neck of left femur, initial encounter for closed fracture: Secondary | ICD-10-CM | POA: Diagnosis not present

## 2023-05-13 DIAGNOSIS — B961 Klebsiella pneumoniae [K. pneumoniae] as the cause of diseases classified elsewhere: Secondary | ICD-10-CM | POA: Diagnosis not present

## 2023-05-13 DIAGNOSIS — Z87440 Personal history of urinary (tract) infections: Secondary | ICD-10-CM | POA: Diagnosis not present

## 2023-05-13 DIAGNOSIS — K589 Irritable bowel syndrome without diarrhea: Secondary | ICD-10-CM | POA: Diagnosis not present

## 2023-05-13 DIAGNOSIS — Z7901 Long term (current) use of anticoagulants: Secondary | ICD-10-CM | POA: Diagnosis not present

## 2023-05-13 DIAGNOSIS — S72002D Fracture of unspecified part of neck of left femur, subsequent encounter for closed fracture with routine healing: Secondary | ICD-10-CM | POA: Diagnosis not present

## 2023-05-13 DIAGNOSIS — G3183 Dementia with Lewy bodies: Secondary | ICD-10-CM | POA: Diagnosis not present

## 2023-05-13 DIAGNOSIS — M1990 Unspecified osteoarthritis, unspecified site: Secondary | ICD-10-CM | POA: Diagnosis not present

## 2023-05-13 DIAGNOSIS — N3000 Acute cystitis without hematuria: Secondary | ICD-10-CM | POA: Diagnosis not present

## 2023-05-13 DIAGNOSIS — Z7982 Long term (current) use of aspirin: Secondary | ICD-10-CM | POA: Diagnosis not present

## 2023-05-13 DIAGNOSIS — Z96642 Presence of left artificial hip joint: Secondary | ICD-10-CM | POA: Diagnosis not present

## 2023-05-13 DIAGNOSIS — Z79899 Other long term (current) drug therapy: Secondary | ICD-10-CM | POA: Diagnosis not present

## 2023-05-13 DIAGNOSIS — F0284 Dementia in other diseases classified elsewhere, unspecified severity, with anxiety: Secondary | ICD-10-CM | POA: Diagnosis not present

## 2023-05-13 DIAGNOSIS — Z9181 History of falling: Secondary | ICD-10-CM | POA: Diagnosis not present

## 2023-05-13 DIAGNOSIS — G20A1 Parkinson's disease without dyskinesia, without mention of fluctuations: Secondary | ICD-10-CM | POA: Diagnosis not present

## 2023-05-13 DIAGNOSIS — Z8673 Personal history of transient ischemic attack (TIA), and cerebral infarction without residual deficits: Secondary | ICD-10-CM | POA: Diagnosis not present

## 2023-05-13 DIAGNOSIS — E7800 Pure hypercholesterolemia, unspecified: Secondary | ICD-10-CM | POA: Diagnosis not present

## 2023-05-13 DIAGNOSIS — Z8701 Personal history of pneumonia (recurrent): Secondary | ICD-10-CM | POA: Diagnosis not present

## 2023-05-13 NOTE — Transitions of Care (Post Inpatient/ED Visit) (Signed)
05/13/2023  Name: Deborah Velazquez MRN: [id number] DOB: 1946-08-03  Today's TOC FU Call Status: Today's TOC FU Call Status:: Successful TOC FU Call Competed TOC FU Call Complete Date: 03/24/23  Transition Care Management Follow-up Telephone Call Date of Discharge: 05/13/23 Discharge Facility: [facility]) Type of Discharge: Inpatient Admission Primary Inpatient Discharge Diagnosis:: Closed fracture of neck of left femur How have you been since you were released from the hospital?: Better Any questions or concerns?: No  Items Reviewed: Did you receive and understand the discharge instructions provided?: Yes Medications obtained,verified, and reconciled?: Yes (Medications Reviewed) Any new allergies since your discharge?: No Dietary orders reviewed?: No Do you have support at home?: Yes Name of Support/Comfort Primary Source: [name]  Medications Reviewed Today: Medications Reviewed Today     Reviewed by Luella Cook, RN (Case Manager) on 05/13/23 at 1044  Med List Status: <None>   Medication Order Taking? Sig Documenting Provider Last Dose Status Informant  aspirin EC 81 MG tablet [id number] Yes Take 81 mg by mouth daily. Swallow whole. [provider] Taking Active Care Giver  atorvastatin (LIPITOR) 10 MG tablet [id number] Yes TAKE 1 TABLET(10 MG) BY MOUTH AT BEDTIME Sherlene Shams, MD Taking Active   carbidopa-levodopa (SINEMET CR) 50-200 MG tablet [id number] Yes Take 1 tablet by mouth 5 (five) times daily. Patient takes at 0700,1030,1400,1730,2200 [provider] Taking Active Care Giver  cefdinir (OMNICEF) 300 MG capsule [id number] Yes Take 1 capsule (300 mg total) by mouth 2 (two) times daily for 2 days. Pennie Banter, DO Taking Active   docusate sodium (COLACE) 100 MG capsule [id number] Yes Take 1 capsule (100 mg total) by mouth 2 (two) times daily. Pennie Banter, DO Taking Active   DULoxetine (CYMBALTA) 60 MG capsule  [id number] Yes Take 60 mg by mouth daily.  [provider] Taking Active Care Giver  enoxaparin (LOVENOX) 40 MG/0.4ML injection [id number] Yes Inject 0.4 mLs (40 mg total) into the skin daily for 14 days. Evon Slack, PA-C Taking Active   estradiol (ESTRACE) 0.1 MG/GM vaginal cream [id number] Yes INSERT 1 APPLICATORFUL VAGINALLY 2 TIMES A WEEK AS DIRECTED Sherlene Shams, MD Taking Active Care Giver  guaiFENesin-dextromethorphan [facility] DM) 100-10 MG/5ML syrup [id number] Yes Take 5 mLs by mouth every 4 (four) hours as needed for cough. Alford Highland, MD Taking Active Care Giver  HYDROcodone-acetaminophen (NORCO/VICODIN) 5-325 MG tablet [id number] Yes Take 0.5-1 tablets by mouth every 6 (six) hours as needed for moderate pain (pain score 4-6). Evon Slack, PA-C Taking Active   hydrOXYzine (ATARAX/VISTARIL) 25 MG tablet [id number]  Take 25 mg by mouth 2 (two) times daily as needed.  Patient not taking: Reported on 05/08/2023   [provider]  Active Care Giver           Med Note Marijo Sanes   Wed Oct 25, 2020 11:57 AM) PRN  lamoTRIgine (LAMICTAL) 100 MG tablet [id number] Yes Take 100 mg by mouth 2 (two) times daily. [provider] Taking Active Care Giver  nystatin (MYCOSTATIN/NYSTOP) powder [id number] Yes Apply 1 application. topically 2 (two) times daily. To rash until resolved. Sherlene Shams, MD Taking Active Care Giver  OVER THE COUNTER MEDICATION [id number] Yes Take 1 capsule by mouth in the morning and at bedtime. Doterra Life Long Vitality Pack [provider] Taking Active Care Giver  pregabalin (LYRICA) 150 MG capsule [id number] Yes Take 150 mg by mouth 2 (two) times daily.  [provider]  Taking Active Care Giver  QUEtiapine (SEROQUEL) 25 MG tablet [id number] Yes Take 0.5 tablets (12.5 mg total) by mouth at bedtime.  Patient taking differently: Take 12.5 mg by mouth 2 (two) times daily.   Alford Highland, MD Taking Active Care Giver   sertraline (ZOLOFT) 100 MG tablet [id number] Yes Take 200 mg by mouth daily.  [provider] Taking Active Care Giver            Home Care and Equipment/Supplies: Were Home Health Services Ordered?: Yes Name of Home Health Agency:: [facility] Has Agency set up a time to come to your home?: No EMR reviewed for Home Health Orders: Orders present/patient has not received call (refer to CM for follow-up) Any new equipment or medical supplies ordered?: NA  Functional Questionnaire: Do you need assistance with bathing/showering or dressing?: Yes Do you need assistance with meal preparation?: Yes Do you need assistance with eating?: No Do you have difficulty maintaining continence: Yes Do you need assistance with getting out of bed/getting out of a chair/moving?: Yes Do you have difficulty managing or taking your medications?: Yes  Follow up appointments reviewed: PCP Follow-up appointment confirmed?: Yes Date of PCP follow-up appointment?: 06/04/23 Follow-up Provider: Duncan Dull Specialist [facility] Follow-up appointment confirmed?: No Reason Specialist Follow-Up Not Confirmed: Patient has Specialist Provider Number and will Call for Appointment (Specialst will call with date and time. POA had number to call also) Do you need transportation to your follow-up appointment?: No Do you understand care options if your condition(s) worsen?: Yes-patient verbalized understanding  SDOH Interventions Today    Flowsheet Row Most Recent Value  SDOH Interventions   Food Insecurity Interventions Intervention Not Indicated  Housing Interventions Intervention Not Indicated  Transportation Interventions Intervention Not Indicated, Patient Resources (Friends/Family)      Interventions Today    Flowsheet Row Most Recent Value  General Interventions   General Interventions Discussed/Reviewed General Interventions Reviewed, General Interventions Discussed, Doctor Visits  Exercise  Interventions   Exercise Discussed/Reviewed Exercise Discussed  [Patient is to receive PT/OT]  Nutrition Interventions   Nutrition Discussed/Reviewed Nutrition Discussed  Pharmacy Interventions   Pharmacy Dicussed/Reviewed Pharmacy Topics Reviewed, Pharmacy Topics Discussed      TOC Interventions Today    Flowsheet Row Most Recent Value  TOC Interventions   TOC Interventions Discussed/Reviewed TOC Interventions Discussed, TOC Interventions Reviewed, Arranged PCP follow up less than 12 days/Care Guide scheduled        Gean Maidens BSN RN [facility] [phone]

## 2023-05-14 DIAGNOSIS — F411 Generalized anxiety disorder: Secondary | ICD-10-CM | POA: Diagnosis not present

## 2023-05-14 DIAGNOSIS — F332 Major depressive disorder, recurrent severe without psychotic features: Secondary | ICD-10-CM | POA: Diagnosis not present

## 2023-05-14 DIAGNOSIS — G4700 Insomnia, unspecified: Secondary | ICD-10-CM | POA: Diagnosis not present

## 2023-05-21 DIAGNOSIS — G3183 Dementia with Lewy bodies: Secondary | ICD-10-CM | POA: Diagnosis not present

## 2023-05-21 DIAGNOSIS — Z8673 Personal history of transient ischemic attack (TIA), and cerebral infarction without residual deficits: Secondary | ICD-10-CM | POA: Diagnosis not present

## 2023-05-26 ENCOUNTER — Telehealth: Payer: Self-pay | Admitting: Internal Medicine

## 2023-05-26 ENCOUNTER — Encounter: Payer: Self-pay | Admitting: Internal Medicine

## 2023-05-26 NOTE — Telephone Encounter (Signed)
Pt friend Data processing manager) called in stating that pt have a blister that its infected on top of her leg, she stated pt had hip surgery and was wondering if Dr. Darrick Huntsman can sent some antibiotics over to her pharmacy. She stated that they are unable to get pt up from bed because they are waiting on physical therapy to give them the ok to do so. She stated she will try to send some pics over through MyChart for provider to see. Any questions, she's available at [phone].

## 2023-05-26 NOTE — Telephone Encounter (Signed)
I'm sorry, but The photograph taken does not suggest infection,  it suggests a pressure ulcer related to her prolonged bed rest.  I cannot tell what part of the hip is the wound,; this is why my chart messages  are not used to treat patients.  Please call the orthopedist if you are concerned about infection  since he has actually seen the wound

## 2023-05-26 NOTE — Telephone Encounter (Signed)
There was also a mychart that was forwarded to you. There is a picture of the wound in the [employer].

## 2023-06-04 ENCOUNTER — Encounter: Payer: Self-pay | Admitting: Internal Medicine

## 2023-06-04 ENCOUNTER — Ambulatory Visit (INDEPENDENT_AMBULATORY_CARE_PROVIDER_SITE_OTHER): Payer: PPO | Admitting: Internal Medicine

## 2023-06-04 VITALS — BP 110/64 | HR 80 | Temp 97.6°F

## 2023-06-04 DIAGNOSIS — J690 Pneumonitis due to inhalation of food and vomit: Secondary | ICD-10-CM

## 2023-06-04 DIAGNOSIS — R1900 Intra-abdominal and pelvic swelling, mass and lump, unspecified site: Secondary | ICD-10-CM | POA: Insufficient documentation

## 2023-06-04 DIAGNOSIS — G3183 Dementia with Lewy bodies: Secondary | ICD-10-CM | POA: Diagnosis not present

## 2023-06-04 DIAGNOSIS — N952 Postmenopausal atrophic vaginitis: Secondary | ICD-10-CM | POA: Diagnosis not present

## 2023-06-04 DIAGNOSIS — D649 Anemia, unspecified: Secondary | ICD-10-CM

## 2023-06-04 DIAGNOSIS — B372 Candidiasis of skin and nail: Secondary | ICD-10-CM

## 2023-06-04 DIAGNOSIS — F02B2 Dementia in other diseases classified elsewhere, moderate, with psychotic disturbance: Secondary | ICD-10-CM

## 2023-06-04 MED ORDER — ESTRADIOL 0.1 MG/GM VA CREA
TOPICAL_CREAM | VAGINAL | 0 refills | Status: DC
Start: 2023-06-04 — End: 2024-01-15

## 2023-06-04 NOTE — Assessment & Plan Note (Signed)
Incidental finding on April 2024 CT of an oblong cystic mass between vagina and rectum.  Discussed today,.  She is asymptomatic .  She declines additional imaging at this time due to recent hip fracture and limited ROM

## 2023-06-04 NOTE — Assessment & Plan Note (Addendum)
Noted in April during admission.  Most recent admission noted RUL opacity in June. Caregivers have noted occasional cough with drinking.  Advised to maintain upright position and minimize distractions during feeding . Repeat chest x ray ordered

## 2023-06-04 NOTE — Progress Notes (Signed)
Subjective:  Patient ID: Deborah Velazquez, female    DOB: [birth year]  Age: 77 y.o. MRN: [id number]  CC: The primary encounter diagnosis was Anemia, unspecified type. Diagnoses of Candidiasis of skin, Aspiration pneumonia of right lower lobe due to gastric secretions (HCC), Atrophic vaginitis, Intra-abdominal and pelvic swelling, mass and lump, unspecified site, and Moderate Lewy body dementia with psychotic disturbance (HCC) were also pertinent to this visit.   HPI Deborah Velazquez presents for  Chief Complaint  Patient presents with   Hospitalization Follow-up    Patient was admitted to [facility] with a hip fracture involving the left femur that occurred when she got out of bed unassisted to use the bathroom.  (According to family she is quite mobile when she is sleepwalking, but remains unable to walk without assistance while awake)   She underwent surgical fixation, but sustained a blister from the surgical tape covering the wound which has not healed despite several weeks of wound care by her caregivers, who have een applying antibacterial ointment to the wound and keeping it covered.  The wound has not changed in week.  . She is reeceiving PT since discharge in late June.  Has missed  a few sessions due to hypersomnia .  Requires urging to cooperate with PT  H/o aspiration pneumonia noted in April,  developing RUL opacity noted during recent admission   PD with Lewy Body Dementia:  Still hallucinating (visual) but less so with lamotrigine.  Staff notices that she has been less agitated , more jovial since being bedbound .  Will sleep for 36 hours per staff , not easily woken.will sleep through the bath,  clothing change. . Has 2 caregivers at all times.     Outpatient Medications Prior to Visit  Medication Sig Dispense Refill   aspirin EC 81 MG tablet Take 81 mg by mouth daily. Swallow whole.     atorvastatin (LIPITOR) 10 MG tablet TAKE 1 TABLET(10 MG) BY MOUTH AT BEDTIME 90 tablet 3    carbidopa-levodopa (SINEMET CR) 50-200 MG tablet Take 1 tablet by mouth 5 (five) times daily. Patient takes at 0700,1030,1400,1730,2200     DULoxetine (CYMBALTA) 60 MG capsule Take 60 mg by mouth daily.      lamoTRIgine (LAMICTAL) 100 MG tablet Take 100 mg by mouth 2 (two) times daily.     nystatin (MYCOSTATIN/NYSTOP) powder Apply 1 application. topically 2 (two) times daily. To rash until resolved. 15 g 0   OVER THE COUNTER MEDICATION Take 1 capsule by mouth in the morning and at bedtime. Doterra Life Long Vitality Pack     pregabalin (LYRICA) 150 MG capsule Take 150 mg by mouth 2 (two) times daily.      QUEtiapine (SEROQUEL) 25 MG tablet Take 0.5 tablets (12.5 mg total) by mouth at bedtime. (Patient taking differently: Take 12.5 mg by mouth 2 (two) times daily.)     sertraline (ZOLOFT) 100 MG tablet Take 200 mg by mouth daily.      docusate sodium (COLACE) 100 MG capsule Take 1 capsule (100 mg total) by mouth 2 (two) times daily. 10 capsule 0   enoxaparin (LOVENOX) 40 MG/0.4ML injection Inject 0.4 mLs (40 mg total) into the skin daily for 14 days. 5.6 mL 0   estradiol (ESTRACE) 0.1 MG/GM vaginal cream INSERT 1 APPLICATORFUL VAGINALLY 2 TIMES A WEEK AS DIRECTED 42.5 g 0   guaiFENesin-dextromethorphan (ROBITUSSIN DM) 100-10 MG/5ML syrup Take 5 mLs by mouth every 4 (four) hours as needed for cough. 118  mL 0   HYDROcodone-acetaminophen (NORCO/VICODIN) 5-325 MG tablet Take 0.5-1 tablets by mouth every 6 (six) hours as needed for moderate pain (pain score 4-6). 20 tablet 0   hydrOXYzine (ATARAX/VISTARIL) 25 MG tablet Take 25 mg by mouth 2 (two) times daily as needed. (Patient not taking: Reported on 05/08/2023)     No facility-administered medications prior to visit.    Review of Systems;  Patient denies headache, fevers, malaise, unintentional weight loss, skin rash, eye pain, sinus congestion and sinus pain, sore throat, dysphagia,  hemoptysis , cough, dyspnea, wheezing, chest pain, palpitations,  orthopnea, edema, abdominal pain, nausea, melena, diarrhea, constipation, flank pain, dysuria, hematuria, urinary  Frequency, nocturia, numbness, tingling, seizures,  Focal weakness, Loss of consciousness,  Tremor, insomnia, depression, anxiety, and suicidal ideation.      Objective:  BP 110/64 (BP Location: Left Arm, Patient Position: Sitting, Cuff Size: Normal)   Pulse 80   Temp 97.6 F (36.4 C) (Temporal)   SpO2 96%   BP Readings from Last 3 Encounters:  06/04/23 110/64  05/12/23 101/69  04/08/23 120/80    Wt Readings from Last 3 Encounters:  05/07/23 156 lb 15.5 oz (71.2 kg)  04/08/23 157 lb (71.2 kg)  03/17/23 164 lb 14.5 oz (74.8 kg)    Physical Exam Vitals reviewed.  Constitutional:      General: She is not in acute distress.    Appearance: Normal appearance. She is well-groomed and normal weight. She is not ill-appearing, toxic-appearing or diaphoretic.     Comments: Seated in wheelchair   HENT:     Head: Normocephalic.  Eyes:     General: No scleral icterus.       Right eye: No discharge.        Left eye: No discharge.     Conjunctiva/sclera: Conjunctivae normal.  Cardiovascular:     Rate and Rhythm: Normal rate and regular rhythm.     Heart sounds: Normal heart sounds.  Pulmonary:     Effort: Pulmonary effort is normal. No respiratory distress.     Breath sounds: Normal breath sounds.  Musculoskeletal:        General: Normal range of motion.  Skin:    General: Skin is warm and dry.     Findings: Wound present.          Comments: Stage 2 ulcer on anterior thigh with 2 =mm ring of erythema, central eschar   Neurological:     General: No focal deficit present.     Mental Status: She is alert and oriented to person, place, and time. Mental status is at baseline.  Psychiatric:        Mood and Affect: Mood normal.        Behavior: Behavior normal.        Thought Content: Thought content normal.        Judgment: Judgment normal.    Lab Results   Component Value Date   HGBA1C 6.1 04/08/2023   HGBA1C 6.4 07/16/2022   HGBA1C 6.2 01/10/2022    Lab Results  Component Value Date   CREATININE 0.57 05/11/2023   CREATININE 0.65 05/10/2023   CREATININE 0.67 05/09/2023    Lab Results  Component Value Date   WBC 8.5 05/12/2023   HGB 9.7 (L) 05/12/2023   HCT 30.2 (L) 05/12/2023   PLT 204 05/12/2023   GLUCOSE 118 (H) 05/11/2023   CHOL 247 (H) 07/16/2022   TRIG 293.0 (H) 07/16/2022   HDL 65.40 07/16/2022   LDLDIRECT 129.0 07/16/2022  LDLCALC 81 07/09/2018   ALT 7 05/07/2023   AST 24 05/07/2023   NA 135 05/11/2023   K 3.6 05/11/2023   CL 104 05/11/2023   CREATININE 0.57 05/11/2023   BUN 10 05/11/2023   CO2 23 05/11/2023   TSH 2.47 07/16/2022   HGBA1C 6.1 04/08/2023   MICROALBUR 0.7 06/29/2020    DG Pelvis Portable  Result Date: 05/08/2023 CLINICAL DATA:  Postop. EXAM: PORTABLE PELVIS 1-2 VIEWS COMPARISON:  Preoperative imaging. FINDINGS: Left hip arthroplasty in expected alignment. No periprosthetic lucency or fracture. Recent postsurgical change includes air and edema in the soft tissues. IMPRESSION: Left hip arthroplasty without immediate postoperative complication. Electronically Signed   By: Narda Rutherford M.D.   On: 05/08/2023 15:57   DG FEMUR MIN 2 VIEWS LEFT  Result Date: 05/07/2023 CLINICAL DATA:  97979 Hip fracture (HCC) 97979 EXAM: LEFT FEMUR 2 VIEWS COMPARISON:  Same day hip radiograph FINDINGS: Redemonstrated displaced and angulated subcapital femoral neck fracture. Alignment grossly unchanged compared to same day prior. Normal alignment of the knee joint tibial plateau is normal in appearance. Osteopenia. No radiographically discernible soft tissue abnormality. Lumbar spinal fusion hardware is partially visualized. Assessment of the sacrum is limited due to overlying bowel gas. IMPRESSION: Redemonstrated displaced and angulated subcapital femoral neck fracture. Electronically Signed   By: Lorenza Cambridge M.D.    On: 05/07/2023 13:03   DG Chest Portable 1 View  Result Date: 05/07/2023 CLINICAL DATA:  Pain after fall EXAM: PORTABLE CHEST 1 VIEW COMPARISON:  X-ray 03/17/2023 FINDINGS: Underinflation. Developing right upper lobe opacity. Possible infiltrate. No pneumothorax, effusion or edema. Film is rotated to the right. Enlarged cardiopericardial silhouette with tortuous and ectatic aorta. Degenerative changes of the spine. Osteopenia IMPRESSION: Underinflated and rotated radiograph. Enlarged heart with tortuous ectatic aorta. Developing right upper lobe opacity. Atelectasis versus infiltrate. Recommend follow-up Electronically Signed   By: Karen Kays M.D.   On: 05/07/2023 12:13   DG Hip Unilat W or Wo Pelvis 2-3 Views Left  Result Date: 05/07/2023 CLINICAL DATA:  Pain after fall EXAM: DG HIP (WITH OR WITHOUT PELVIS) 3V LEFT COMPARISON:  None Available. FINDINGS: Osteopenia. There is a subcapital left femoral neck fracture with overriding of fracture fragments and angulation best depicted on the lateral view. Severe osteopenia. No additional fracture or dislocation. Osteophyte formation along both sacroiliac joints. Fixation hardware along the lower lumbar spine. Overlapping cardiac leads. There some well rounded densities in the pelvis which are indeterminate although possibly vascular. Pelvic view is rotated to the right. Hyperostosis. IMPRESSION: Displaced and angulated subcapital left femoral neck fracture. Osteopenia with degenerative changes. Electronically Signed   By: Karen Kays M.D.   On: 05/07/2023 12:12   CT Cervical Spine Wo Contrast  Result Date: 05/07/2023 CLINICAL DATA:  77 year old female status post fall last night. Pain. EXAM: CT CERVICAL SPINE WITHOUT CONTRAST TECHNIQUE: Multidetector CT imaging of the cervical spine was performed without intravenous contrast. Multiplanar CT image reconstructions were also generated. RADIATION DOSE REDUCTION: This exam was performed according to the  departmental dose-optimization program which includes automated exposure control, adjustment of the mA and/or kV according to patient size and/or use of iterative reconstruction technique. COMPARISON:  Head CT today. FINDINGS: Alignment: Cervicothoracic junction alignment is within normal limits. Bilateral posterior element alignment is within normal limits. Mild degenerative appearing anterolisthesis of C4 on C5, with chronic facet degeneration there greater on the left. Mild straightening of lordosis otherwise. Skull base and vertebrae: Some generalized osteopenia. Visualized skull base is intact. No  atlanto-occipital dissociation. C1 and C2 appear intact and aligned. No acute osseous abnormality identified. Soft tissues and spinal canal: No prevertebral fluid or swelling. No visible canal hematoma. Negative visible noncontrast neck soft tissues. Disc levels: Degenerative appearing facet ankylosis on the left at C5-C6. Adjacent segment spondylolisthesis and facet arthropathy at C4-C5. Advanced disc and endplate degeneration also at the adjacent C6-C7 level. Up to mild associated cervical spinal stenosis. Upper chest: Osteopenia. Grossly intact visible upper thoracic levels. Nonspecific patchy ground-glass opacity in the upper lungs, somewhat greater on the right and associated with pleural thickening and/or scarring there. But this is stable compared to prior CTA 03/17/2023 (please see that report) IMPRESSION: 1. No acute traumatic injury identified in the cervical spine. 2. Osteopenia, and advanced cervical spine degeneration surrounding degenerative appearing ankylosis of C5-C6. 3. Unresolved apical lung opacity from an April chest CTA, thought to be infectious or inflammatory at that time. Electronically Signed   By: Odessa Fleming M.D.   On: 05/07/2023 11:44   CT HEAD WO CONTRAST ( )  Result Date: 05/07/2023 CLINICAL DATA:  77 year old female status post fall last night. Hip pain. EXAM: CT HEAD WITHOUT CONTRAST  TECHNIQUE: Contiguous axial images were obtained from the base of the skull through the vertex without intravenous contrast. RADIATION DOSE REDUCTION: This exam was performed according to the departmental dose-optimization program which includes automated exposure control, adjustment of the mA and/or kV according to patient size and/or use of iterative reconstruction technique. COMPARISON:  Brain MRI 10/01/2021 and head CT 03/17/2023. FINDINGS: Brain: Chronic cerebral volume loss with possibly ex vacuo enlargement of the lateral ventricles does not appear significantly changed from the 2022 MRI. No midline shift, mass effect, evidence of mass lesion, intracranial hemorrhage or evidence of cortically based acute infarction. Moderate, Patchy and confluent white matter hypodensity with bilateral deep gray matter nuclei heterogeneity appears stable from the previous MRI. Vascular: Calcified atherosclerosis at the skull base. No suspicious intracranial vascular hyperdensity. Skull: No acute osseous abnormality identified. Sinuses/Orbits: Visualized paranasal sinuses and mastoids are clear. Other: No acute orbit or scalp soft tissue injury identified. IMPRESSION: 1. No acute traumatic injury identified. 2. Chronic ventriculomegaly and chronic small vessel disease appear not significantly changed from a 2022 MRI. Electronically Signed   By: Odessa Fleming M.D.   On: 05/07/2023 11:32    Assessment & Plan:  .Anemia, unspecified type -     CBC with Differential/Platelet -     Basic metabolic panel -     IBC + Ferritin -     DG Chest 2 View; Future  Candidiasis of skin -     Estradiol; INSERT 1 APPLICATORFUL VAGINALLY 2 TIMES A WEEK AS DIRECTED  Dispense: 42.5 g; Refill: 0  Aspiration pneumonia of right lower lobe due to gastric secretions [facility]) Assessment & Plan: Noted in April during admission.  Most recent admission noted RUL opacity in June. Caregivers have noted occasional cough with drinking.  Advised to maintain  upright position and minimize distractions during feeding . Repeat chest x ray ordered    Atrophic vaginitis Assessment & Plan: Managed with estoroen cream    Intra-abdominal and pelvic swelling, mass and lump, unspecified site Assessment & Plan: Incidental finding on April 2024 CT of an oblong cystic mass between vagina and rectum.  Discussed today,.  She is asymptomatic .  She declines additional imaging at this time due to recent hip fracture and limited ROM   Moderate Lewy body dementia with psychotic disturbance [facility]) Assessment & Plan: Her  visual hallucinations have neem reduced with use of lamictal       I provided 44 minutes of face-to-face time during this encounter reviewing patient's last visit with me, patient's  most recent hospitalization,   recent surgical and non surgical procedures, previous  labs and imaging studies, counseling on currently addressed issues,  and post visit ordering to diagnostics and therapeutics .   Follow-up: No follow-ups on file.   Sherlene Shams, MD

## 2023-06-04 NOTE — Patient Instructions (Addendum)
The wound is not infected.  Keep covered,  stop the antibiotic ointment,  try using vaseline to help soften the eschar    Ori is likely aspirating when she eats.   MAKE SURE SHE IS AWAKE, AND EATING QUIETLY AND DELIBERATELY  WHEN HER RANGE OF MOTION IMPROVES WE WILL RE VISIT THE PELVIC ULTRASOUND PLAN

## 2023-06-04 NOTE — Assessment & Plan Note (Signed)
Her visual hallucinations have neem reduced with use of lamictal

## 2023-06-04 NOTE — Assessment & Plan Note (Signed)
Managed with estoroen cream

## 2023-06-05 LAB — CBC WITH DIFFERENTIAL/PLATELET
Basophils Absolute: 0.1 10*3/uL (ref 0.0–0.1)
Basophils Relative: 1.1 % (ref 0.0–3.0)
Eosinophils Absolute: 0.2 10*3/uL (ref 0.0–0.7)
Eosinophils Relative: 2.7 % (ref 0.0–5.0)
HCT: 34.9 % — ABNORMAL LOW (ref 36.0–46.0)
Hemoglobin: 11.5 g/dL — ABNORMAL LOW (ref 12.0–15.0)
Lymphocytes Relative: 29.7 % (ref 12.0–46.0)
Lymphs Abs: 2.4 10*3/uL (ref 0.7–4.0)
MCHC: 32.9 g/dL (ref 30.0–36.0)
MCV: 89.5 fl (ref 78.0–100.0)
Monocytes Absolute: 0.5 10*3/uL (ref 0.1–1.0)
Monocytes Relative: 6.2 % (ref 3.0–12.0)
Neutro Abs: 4.8 10*3/uL (ref 1.4–7.7)
Neutrophils Relative %: 60.3 % (ref 43.0–77.0)
Platelets: 264 10*3/uL (ref 150.0–400.0)
RBC: 3.9 Mil/uL (ref 3.87–5.11)
RDW: 15.8 % — ABNORMAL HIGH (ref 11.5–15.5)
WBC: 8 10*3/uL (ref 4.0–10.5)

## 2023-06-05 LAB — BASIC METABOLIC PANEL
BUN: 10 mg/dL (ref 6–23)
CO2: 28 mEq/L (ref 19–32)
Calcium: 9.6 mg/dL (ref 8.4–10.5)
Chloride: 102 mEq/L (ref 96–112)
Creatinine, Ser: 0.68 mg/dL (ref 0.40–1.20)
GFR: 83.13 mL/min (ref >60.00)
Glucose, Bld: 125 mg/dL — ABNORMAL HIGH (ref 70–99)
Potassium: 4.2 mEq/L (ref 3.5–5.1)
Sodium: 140 mEq/L (ref 135–145)

## 2023-06-05 LAB — IBC + FERRITIN
Ferritin: 80.3 ng/mL (ref 10.0–291.0)
Iron: 48 ug/dL (ref 42–145)
Saturation Ratios: 12.4 % — ABNORMAL LOW (ref 20.0–50.0)
TIBC: 386.4 ug/dL (ref 250.0–450.0)
Transferrin: 276 mg/dL (ref 212.0–360.0)

## 2023-06-20 DIAGNOSIS — G3183 Dementia with Lewy bodies: Secondary | ICD-10-CM | POA: Diagnosis not present

## 2023-06-20 DIAGNOSIS — Z8673 Personal history of transient ischemic attack (TIA), and cerebral infarction without residual deficits: Secondary | ICD-10-CM | POA: Diagnosis not present

## 2023-06-27 ENCOUNTER — Ambulatory Visit
Admission: RE | Admit: 2023-06-27 | Discharge: 2023-06-27 | Disposition: A | Discharge status: Home / Self Care | Payer: PPO | Attending: Internal Medicine | Admitting: Internal Medicine

## 2023-06-27 DIAGNOSIS — Z96649 Presence of unspecified artificial hip joint: Secondary | ICD-10-CM | POA: Diagnosis not present

## 2023-07-21 DIAGNOSIS — Z8673 Personal history of transient ischemic attack (TIA), and cerebral infarction without residual deficits: Secondary | ICD-10-CM | POA: Diagnosis not present

## 2023-07-21 DIAGNOSIS — G3183 Dementia with Lewy bodies: Secondary | ICD-10-CM | POA: Diagnosis not present

## 2023-08-21 DIAGNOSIS — G3183 Dementia with Lewy bodies: Secondary | ICD-10-CM | POA: Diagnosis not present

## 2023-08-21 DIAGNOSIS — Z8673 Personal history of transient ischemic attack (TIA), and cerebral infarction without residual deficits: Secondary | ICD-10-CM | POA: Diagnosis not present

## 2023-09-10 DIAGNOSIS — F332 Major depressive disorder, recurrent severe without psychotic features: Secondary | ICD-10-CM | POA: Diagnosis not present

## 2023-09-10 DIAGNOSIS — G20B2 Parkinson's disease with dyskinesia, with fluctuations: Secondary | ICD-10-CM | POA: Diagnosis not present

## 2023-09-10 DIAGNOSIS — G4700 Insomnia, unspecified: Secondary | ICD-10-CM | POA: Diagnosis not present

## 2023-09-10 DIAGNOSIS — F411 Generalized anxiety disorder: Secondary | ICD-10-CM | POA: Diagnosis not present

## 2023-09-20 DIAGNOSIS — Z8673 Personal history of transient ischemic attack (TIA), and cerebral infarction without residual deficits: Secondary | ICD-10-CM | POA: Diagnosis not present

## 2023-09-20 DIAGNOSIS — G3183 Dementia with Lewy bodies: Secondary | ICD-10-CM | POA: Diagnosis not present

## 2023-10-21 DIAGNOSIS — Z8673 Personal history of transient ischemic attack (TIA), and cerebral infarction without residual deficits: Secondary | ICD-10-CM | POA: Diagnosis not present

## 2023-10-21 DIAGNOSIS — G3183 Dementia with Lewy bodies: Secondary | ICD-10-CM | POA: Diagnosis not present

## 2023-10-29 ENCOUNTER — Telehealth: Payer: Self-pay | Admitting: Internal Medicine

## 2023-10-29 NOTE — Telephone Encounter (Signed)
Copied from CRM [id number]. Topic: Medicare AWV >> Oct 29, 2023  9:36 AM Payton Doughty wrote: Reason for CRM: Called LVM 10/29/2023 to schedule Annual Wellness Visit  Verlee Rossetti; Care Guide Ambulatory Clinical Support [place] [facility] Direct Dial: [phone]

## 2023-11-06 DIAGNOSIS — G4719 Other hypersomnia: Secondary | ICD-10-CM | POA: Diagnosis not present

## 2023-11-06 DIAGNOSIS — G20A1 Parkinson's disease without dyskinesia, without mention of fluctuations: Secondary | ICD-10-CM | POA: Diagnosis not present

## 2023-11-06 DIAGNOSIS — F0280 Dementia in other diseases classified elsewhere without behavioral disturbance: Secondary | ICD-10-CM | POA: Diagnosis not present

## 2023-11-06 DIAGNOSIS — G4752 REM sleep behavior disorder: Secondary | ICD-10-CM | POA: Diagnosis not present

## 2023-11-06 DIAGNOSIS — R569 Unspecified convulsions: Secondary | ICD-10-CM | POA: Diagnosis not present

## 2023-11-06 DIAGNOSIS — G5793 Unspecified mononeuropathy of bilateral lower limbs: Secondary | ICD-10-CM | POA: Diagnosis not present

## 2023-11-11 ENCOUNTER — Ambulatory Visit: Payer: PPO | Admitting: *Deleted

## 2023-11-11 VITALS — Ht 65.0 in | Wt 153.0 lb

## 2023-11-11 DIAGNOSIS — Z0000 Encounter for general adult medical examination without abnormal findings: Secondary | ICD-10-CM

## 2023-11-11 NOTE — Progress Notes (Signed)
Subjective:   Deborah Velazquez is a 77 y.o. female who presents for Medicare Annual (Subsequent) preventive examination.  Visit Complete: Virtual I connected with  Vertis Kelch on 11/11/23 by a audio enabled telemedicine application and verified that I am speaking with the correct person using two identifiers.  Patient Location: Home  Provider Location: Office/Clinic  I discussed the limitations of evaluation and management by telemedicine. The patient expressed understanding and agreed to proceed.  Vital Signs: Because this visit was a virtual/telehealth visit, some criteria may be missing or patient reported. Any vitals not documented were not able to be obtained and vitals that have been documented are patient reported.   Cardiac Risk Factors include: advanced age (>33men, >8 women);sedentary lifestyle;dyslipidemia     Objective:    Today's Vitals   11/11/23 1110  Weight: 153 lb (69.4 kg)  Height: 5\' 5"  (1.651 m)   Body mass index is 25.46 kg/m.     11/11/2023   11:34 AM 05/08/2023   12:28 PM 05/07/2023    2:00 PM 03/17/2023   11:28 AM 06/22/2020    3:11 PM 04/25/2020    3:25 PM 06/16/2019    4:24 PM  Advanced Directives  Does Patient Have a Medical Advance Directive? Yes Yes Yes No Yes Yes Yes  Type of Estate agent of [place];Living will Healthcare Power of [place];Living will Healthcare Power of [employer] of [place];Living will Living will;Healthcare Power of [employer] Power of [place];Living will  Does patient want to make changes to medical advance directive? No - Patient declined No - Patient declined No - Patient declined  No - Patient declined  No - Patient declined  Copy of Healthcare Power of Attorney in Chart? Yes - validated most recent copy scanned in chart (See row information)  Yes - validated most recent copy scanned in chart (See row information)  No - copy requested  No - copy requested  Would patient  like information on creating a medical advance directive?  No - Patient declined  No - Patient declined       Current Medications (verified) Outpatient Encounter Medications as of 11/11/2023  Medication Sig   aspirin EC 81 MG tablet Take 81 mg by mouth daily. Swallow whole.   atorvastatin (LIPITOR) 10 MG tablet TAKE 1 TABLET(10 MG) BY MOUTH AT BEDTIME   carbidopa-levodopa (SINEMET CR) 50-200 MG tablet Take 1 tablet by mouth 5 (five) times daily. Patient takes at 0700,1030,1400,1730,2200   DULoxetine (CYMBALTA) 60 MG capsule Take 60 mg by mouth daily.    estradiol (ESTRACE) 0.1 MG/GM vaginal cream INSERT 1 APPLICATORFUL VAGINALLY 2 TIMES A WEEK AS DIRECTED   lamoTRIgine (LAMICTAL) 100 MG tablet Take 100 mg by mouth 2 (two) times daily.   nystatin (MYCOSTATIN/NYSTOP) powder Apply 1 application. topically 2 (two) times daily. To rash until resolved.   OVER THE COUNTER MEDICATION Take 1 capsule by mouth in the morning and at bedtime. Doterra Life Long Vitality Pack   pregabalin (LYRICA) 150 MG capsule Take 150 mg by mouth 2 (two) times daily.    QUEtiapine (SEROQUEL) 25 MG tablet Take 0.5 tablets (12.5 mg total) by mouth at bedtime. (Patient taking differently: Take 12.5 mg by mouth 2 (two) times daily.)   sertraline (ZOLOFT) 100 MG tablet Take 200 mg by mouth daily.    No facility-administered encounter medications on file as of 11/11/2023.    Allergies (verified) Pseudoephedrine hcl and Sudafed [pseudoephedrine]   History: Past Medical History:  Diagnosis Date   Anxiety    Arthritis    Hypercholesterolemia    Hyperlipemia    Irritable bowel syndrome    Parkinson disease (HCC)    Pneumonia due to human metapneumovirus 03/18/2023   Restless leg syndrome    RSD (reflex sympathetic dystrophy)    pt denies   Stroke [facility])    TIA's   UTI due to Klebsiella species 05/10/2023   Past Surgical History:  Procedure Laterality Date   APPENDECTOMY     BACK SURGERY     BREAST BIOPSY Left  08/16/2013   neg   CATARACT EXTRACTION Left    CATARACT EXTRACTION W/PHACO Right 06/01/2020   Procedure: CATARACT EXTRACTION PHACO AND INTRAOCULAR LENS PLACEMENT (IOC) RIGHT;  Surgeon: Lockie Mola, MD;  Location: [facility] ORS;  Service: Ophthalmology;  Laterality: Right;  cde5.26 us01:05.7 ap8.0   COLONOSCOPY     COLONOSCOPY WITH PROPOFOL N/A 06/20/2015   Procedure: COLONOSCOPY WITH PROPOFOL;  Surgeon: Christena Deem, MD;  Location: [facility] ENDOSCOPY;  Service: Endoscopy;  Laterality: N/A;   EYE SURGERY     Cataract in one eye   FRACTURE SURGERY     wrist - right and right leg. (? if any metal)   HIP ARTHROPLASTY Left 05/08/2023   Procedure: ARTHROPLASTY BIPOLAR HIP (HEMIARTHROPLASTY);  Surgeon: Reinaldo Berber, MD;  Location: [facility] ORS;  Service: Orthopedics;  Laterality: Left;   PARS PLANA VITRECTOMY Left 2010   Family History  Problem Relation Age of Onset   Breast cancer Mother 48   Diabetes Mother    Cancer Mother    Heart disease Mother    Alzheimer's disease Father    Heart disease Maternal Aunt    Social History   Socioeconomic History   Marital status: Married    Spouse name: Dorene Sorrow   Number of children: Not on file   Years of education: Not on file   Highest education level: Not on file  Occupational History   Occupation: retired    Comment: Airline pilot  Tobacco Use   Smoking status: Never   Smokeless tobacco: Never  Vaping Use   Vaping status: Never Used  Substance and Sexual Activity   Alcohol use: Yes    Comment: 1-2 x a week   Drug use: No   Sexual activity: Not Currently  Other Topics Concern   Not on file  Social History Narrative   Married   Social Drivers of Health   Financial Resource Strain: Low Risk  (11/11/2023)   Overall Financial Resource Strain (CARDIA)    Difficulty of Paying Living Expenses: Not hard at all  Food Insecurity: No Food Insecurity (11/11/2023)   Hunger Vital Sign    Worried About Running Out of Food in the Last Year: Never  true    Ran Out of Food in the Last Year: Never true  Transportation Needs: No Transportation Needs (11/11/2023)   PRAPARE - Administrator, Civil Service (Medical): No    Lack of Transportation (Non-Medical): No  Physical Activity: Inactive (11/11/2023)   Exercise Vital Sign    Days of Exercise per Week: 0 days    Minutes of Exercise per Session: 0 min  Stress: No Stress Concern Present (11/11/2023)   [employer] of Occupational Health - Occupational Stress Questionnaire    Feeling of Stress : Only a little  Social Connections: Moderately Isolated (11/11/2023)   Social Connection and Isolation Panel [NHANES]    Frequency of Communication with Friends and Family: Once a week  Frequency of Social Gatherings with Friends and Family: More than three times a week    Attends Religious Services: Never    Database administrator or Organizations: No    Attends Engineer, structural: Never    Marital Status: Married    Tobacco Counseling Counseling given: Not Answered   Clinical Intake:  Pre-visit preparation completed: Yes  Pain : No/denies pain     BMI - recorded: 25.46 Nutritional Status: BMI 25 -29 Overweight Nutritional Risks: None Diabetes: No  How often do you need to have someone help you when you read instructions, pamphlets, or other written materials from your doctor or pharmacy?: 1 - Never  Interpreter Needed?: No  Information entered by :: R. Betzalel Umbarger LPN   Activities of Daily Living    11/11/2023   11:16 AM 05/07/2023    2:00 PM  In your present state of health, do you have any difficulty performing the following activities:  Hearing? 0 0  Vision? 0 0  Comment glasses   Difficulty concentrating or making decisions? 1 1  Walking or climbing stairs? 1 1  Dressing or bathing? 1 1  Doing errands, shopping? 1 0  Comment  uses afta  Preparing Food and eating ? Y   Using the Toilet? Y   In the past six months, have you accidently  leaked urine? Y   Comment diapers and depends   Do you have problems with loss of bowel control? N   Managing your Medications? Y   Managing your Finances? Y   Housekeeping or managing your Housekeeping? Y     Patient Care Team: Sherlene Shams, MD as PCP - General (Internal Medicine) Wenda Overland, LCSW as Social Worker  Indicate any recent Medical Services you may have received from other than [facility] providers in the past year (date may be approximate).     Assessment:   This is a routine wellness examination for Paw.  Hearing/Vision screen Hearing Screening - Comments:: No issues Vision Screening - Comments:: glasses   Goals Addressed             This Visit's Progress    Patient Stated       Would like to be able to recognize things better       Depression Screen    11/11/2023   11:25 AM 06/04/2023    2:42 PM 04/08/2023    2:42 PM 01/15/2022    1:52 PM 01/04/2021   10:48 AM 06/22/2020    3:10 PM 06/16/2019    4:24 PM  PHQ 2/9 Scores  PHQ - 2 Score 3 2 3   0 1 1  PHQ- 9 Score 15 15 14       Exception Documentation    Medical reason       Fall Risk    11/11/2023   11:19 AM 06/04/2023    2:42 PM 04/08/2023    2:42 PM 07/16/2022    1:28 PM 02/26/2022   11:52 AM  Fall Risk   Falls in the past year? 1 1 1  0 0  Number falls in past yr: 0 0 1    Injury with Fall? 1 1 0    Comment broke her left hip      Risk for fall due to : History of fall(s);Impaired balance/gait History of fall(s) History of fall(s) Impaired mobility Impaired mobility;Impaired balance/gait  Follow up Falls evaluation completed;Falls prevention discussed Falls evaluation completed Falls evaluation completed Falls evaluation completed Falls evaluation completed  MEDICARE RISK AT HOME: Medicare Risk at Home Any stairs in or around the home?: Yes If so, are there any without handrails?: No Home free of loose throw rugs in walkways, pet beds, electrical cords, etc?: Yes Adequate lighting  in your home to reduce risk of falls?: Yes Life alert?: No Use of a cane, walker or w/c?: Yes (wheelchair at times) Grab bars in the bathroom?: Yes Shower chair or bench in shower?: Yes Elevated toilet seat or a handicapped toilet?: Yes   Cognitive Function:    06/22/2020    3:17 PM  MMSE - Mini Mental State Exam  Not completed: Unable to complete      12/16/2017    9:51 AM  Montreal Cognitive Assessment   Visuospatial/ Executive (0/5) 3  Naming (0/3) 2  Attention: Read list of digits (0/2) 2  Attention: Read list of letters (0/1) 1  Attention: Serial 7 subtraction starting at 100 (0/3) 1  Language: Repeat phrase (0/2) 2  Language : Fluency (0/1) 1  Abstraction (0/2) 1  Delayed Recall (0/5) 4  Orientation (0/6) 6  Total 23  Adjusted Score (based on education) 23      11/11/2023   11:34 AM 06/16/2019    4:28 PM  6CIT Screen  What Year? 4 points 0 points  What month? 3 points 0 points  What time? 3 points 0 points  Count back from 20 4 points 0 points  Months in reverse 4 points 0 points  Repeat phrase 10 points   Total Score 28 points     Immunizations Immunization History  Administered Date(s) Administered   Influenza, High Dose Seasonal PF 09/11/2018   Moderna Sars-Covid-2 Vaccination 01/22/2020, 02/19/2020   Tdap 06/24/2014    TDAP status: Up to date  Flu Vaccine status: Declined, Education has been provided regarding the importance of this vaccine but patient still declined. Advised may receive this vaccine at local pharmacy or Health Dept. Aware to provide a copy of the vaccination record if obtained from local pharmacy or Health Dept. Verbalized acceptance and understanding.  Pneumococcal vaccine status: Due, Education has been provided regarding the importance of this vaccine. Advised may receive this vaccine at local pharmacy or Health Dept. Aware to provide a copy of the vaccination record if obtained from local pharmacy or Health Dept. Verbalized  acceptance and understanding.  Covid-19 vaccine status: Declined, Education has been provided regarding the importance of this vaccine but patient still declined. Advised may receive this vaccine at local pharmacy or Health Dept.or vaccine clinic. Aware to provide a copy of the vaccination record if obtained from local pharmacy or Health Dept. Verbalized acceptance and understanding.  Qualifies for Shingles Vaccine? Yes   Zostavax completed No   Shingrix Completed?: No.    Education has been provided regarding the importance of this vaccine. Patient has been advised to call insurance company to determine out of pocket expense if they have not yet received this vaccine. Advised may also receive vaccine at local pharmacy or Health Dept. Verbalized acceptance and understanding.  Screening Tests Health Maintenance  Topic Date Due   Hepatitis C Screening  Never done   Zoster Vaccines- Shingrix (1 of 2) Never done   Pneumonia Vaccine 58+ Years old (1 of 1 - PCV) Never done   DEXA SCAN  Never done   MAMMOGRAM  12/24/2017   COVID-19 Vaccine (3 - Moderna risk series) 03/18/2020   INFLUENZA VACCINE  06/26/2023   Medicare Annual Wellness (AWV)  07/20/2024 (Originally 06/22/2021)  DTaP/Tdap/Td (2 - Td or Tdap) 06/24/2024   HPV VACCINES  Aged Out   Colonoscopy  Discontinued    Health Maintenance  Health Maintenance Due  Topic Date Due   Hepatitis C Screening  Never done   Zoster Vaccines- Shingrix (1 of 2) Never done   Pneumonia Vaccine 37+ Years old (1 of 1 - PCV) Never done   DEXA SCAN  Never done   MAMMOGRAM  12/24/2017   COVID-19 Vaccine (3 - Moderna risk series) 03/18/2020   INFLUENZA VACCINE  06/26/2023    Colorectal cancer screening: No longer required.   Mammogram status: No longer required due to age.   Bone Density status unable to have done at this time  Lung Cancer Screening: (Low Dose CT Chest recommended if Age 41-80 years, 20 pack-year currently smoking OR have quit w/in  15years.) does not qualify.    Additional Screening:  Hepatitis C Screening: does qualify; Never done  Vision Screening: Recommended annual ophthalmology exams for early detection of glaucoma and other disorders of the eye. Is the patient up to date with their annual eye exam?  Yes  Who is the provider or what is the name of the office in which the patient attends annual eye exams? [facility] If pt is not established with a provider, would they like to be referred to a provider to establish care? No .    Community Resource Referral / Chronic Care Management: CRR required this visit?  No   CCM required this visit?  No     Plan:     I have personally reviewed and noted the following in the patient's chart:   Medical and social history Use of alcohol, tobacco or illicit drugs  Current medications and supplements including opioid prescriptions. Patient is not currently taking opioid prescriptions. Functional ability and status Nutritional status Physical activity Advanced directives List of other physicians Hospitalizations, surgeries, and ER visits in previous 12 months Vitals Screenings to include cognitive, depression, and falls Referrals and appointments  In addition, I have reviewed and discussed with patient certain preventive protocols, quality metrics, and best practice recommendations. A written personalized care plan for preventive services as well as general preventive health recommendations were provided to patient.     Sydell Axon, LPN   16/08/[id number]   After Visit Summary: (MyChart) Due to this being a telephonic visit, the after visit summary with patients personalized plan was offered to patient via MyChart   Nurse Notes: [name] Wise/guardian helped with visit

## 2023-11-11 NOTE — Patient Instructions (Signed)
Deborah Velazquez , Thank you for taking time to come for your Medicare Wellness Visit. I appreciate your ongoing commitment to your health goals. Please review the following plan we discussed and let me know if I can assist you in the future.   Referrals/Orders/Follow-Ups/Clinician Recommendations: Remember to update vaccines, Flu, pneumonia and shingles  This is a list of the screening recommended for you and due dates:  Health Maintenance  Topic Date Due   Hepatitis C Screening  Never done   Zoster (Shingles) Vaccine (1 of 2) Never done   Pneumonia Vaccine (1 of 1 - PCV) Never done   DEXA scan (bone density measurement)  Never done   Mammogram  12/24/2017   COVID-19 Vaccine (3 - Moderna risk series) 03/18/2020   Flu Shot  06/26/2023   DTaP/Tdap/Td vaccine (2 - Td or Tdap) 06/24/2024   Medicare Annual Wellness Visit  11/10/2024   HPV Vaccine  Aged Out   Colon Cancer Screening  Discontinued    Advanced directives: (In Chart) A copy of your advanced directives are scanned into your chart should your provider ever need it.  Next Medicare Annual Wellness Visit scheduled for next year: Yes 11/15/24 @ 10:10

## 2023-11-20 DIAGNOSIS — Z8673 Personal history of transient ischemic attack (TIA), and cerebral infarction without residual deficits: Secondary | ICD-10-CM | POA: Diagnosis not present

## 2023-11-20 DIAGNOSIS — G3183 Dementia with Lewy bodies: Secondary | ICD-10-CM | POA: Diagnosis not present

## 2023-12-21 DIAGNOSIS — Z8673 Personal history of transient ischemic attack (TIA), and cerebral infarction without residual deficits: Secondary | ICD-10-CM | POA: Diagnosis not present

## 2023-12-21 DIAGNOSIS — G3183 Dementia with Lewy bodies: Secondary | ICD-10-CM | POA: Diagnosis not present

## 2023-12-29 DIAGNOSIS — G20A1 Parkinson's disease without dyskinesia, without mention of fluctuations: Secondary | ICD-10-CM | POA: Diagnosis not present

## 2024-01-09 ENCOUNTER — Other Ambulatory Visit: Payer: Self-pay

## 2024-01-09 ENCOUNTER — Emergency Department: Payer: PPO

## 2024-01-09 ENCOUNTER — Inpatient Hospital Stay
Admission: AD | Admit: 2024-01-09 | Discharge: 2024-01-15 | DRG: 871 | Disposition: A | Discharge status: Home Health | Payer: PPO | Attending: Internal Medicine | Admitting: Internal Medicine

## 2024-01-09 DIAGNOSIS — D649 Anemia, unspecified: Secondary | ICD-10-CM | POA: Diagnosis present

## 2024-01-09 DIAGNOSIS — Z803 Family history of malignant neoplasm of breast: Secondary | ICD-10-CM

## 2024-01-09 DIAGNOSIS — J9601 Acute respiratory failure with hypoxia: Secondary | ICD-10-CM | POA: Diagnosis present

## 2024-01-09 DIAGNOSIS — R0689 Other abnormalities of breathing: Secondary | ICD-10-CM | POA: Diagnosis not present

## 2024-01-09 DIAGNOSIS — J45909 Unspecified asthma, uncomplicated: Secondary | ICD-10-CM | POA: Diagnosis present

## 2024-01-09 DIAGNOSIS — G3183 Dementia with Lewy bodies: Secondary | ICD-10-CM | POA: Diagnosis present

## 2024-01-09 DIAGNOSIS — Z96642 Presence of left artificial hip joint: Secondary | ICD-10-CM | POA: Diagnosis present

## 2024-01-09 DIAGNOSIS — R109 Unspecified abdominal pain: Secondary | ICD-10-CM | POA: Diagnosis not present

## 2024-01-09 DIAGNOSIS — I959 Hypotension, unspecified: Secondary | ICD-10-CM | POA: Diagnosis not present

## 2024-01-09 DIAGNOSIS — Z7982 Long term (current) use of aspirin: Secondary | ICD-10-CM

## 2024-01-09 DIAGNOSIS — Z8673 Personal history of transient ischemic attack (TIA), and cerebral infarction without residual deficits: Secondary | ICD-10-CM

## 2024-01-09 DIAGNOSIS — A4150 Gram-negative sepsis, unspecified: Secondary | ICD-10-CM | POA: Diagnosis not present

## 2024-01-09 DIAGNOSIS — F0284 Dementia in other diseases classified elsewhere, unspecified severity, with anxiety: Secondary | ICD-10-CM | POA: Diagnosis present

## 2024-01-09 DIAGNOSIS — F32A Depression, unspecified: Secondary | ICD-10-CM | POA: Diagnosis present

## 2024-01-09 DIAGNOSIS — Z820 Family history of epilepsy and other diseases of the nervous system: Secondary | ICD-10-CM

## 2024-01-09 DIAGNOSIS — A419 Sepsis, unspecified organism: Secondary | ICD-10-CM | POA: Diagnosis not present

## 2024-01-09 DIAGNOSIS — R0989 Other specified symptoms and signs involving the circulatory and respiratory systems: Secondary | ICD-10-CM | POA: Diagnosis not present

## 2024-01-09 DIAGNOSIS — N390 Urinary tract infection, site not specified: Secondary | ICD-10-CM | POA: Diagnosis present

## 2024-01-09 DIAGNOSIS — Z8249 Family history of ischemic heart disease and other diseases of the circulatory system: Secondary | ICD-10-CM

## 2024-01-09 DIAGNOSIS — G629 Polyneuropathy, unspecified: Secondary | ICD-10-CM | POA: Diagnosis present

## 2024-01-09 DIAGNOSIS — R9389 Abnormal findings on diagnostic imaging of other specified body structures: Secondary | ICD-10-CM | POA: Diagnosis not present

## 2024-01-09 DIAGNOSIS — R509 Fever, unspecified: Principal | ICD-10-CM

## 2024-01-09 DIAGNOSIS — R6521 Severe sepsis with septic shock: Secondary | ICD-10-CM | POA: Diagnosis present

## 2024-01-09 DIAGNOSIS — N179 Acute kidney failure, unspecified: Secondary | ICD-10-CM | POA: Diagnosis present

## 2024-01-09 DIAGNOSIS — G4752 REM sleep behavior disorder: Secondary | ICD-10-CM | POA: Diagnosis present

## 2024-01-09 DIAGNOSIS — R000 Tachycardia, unspecified: Secondary | ICD-10-CM | POA: Diagnosis not present

## 2024-01-09 DIAGNOSIS — I10 Essential (primary) hypertension: Secondary | ICD-10-CM | POA: Diagnosis present

## 2024-01-09 DIAGNOSIS — B961 Klebsiella pneumoniae [K. pneumoniae] as the cause of diseases classified elsewhere: Secondary | ICD-10-CM | POA: Diagnosis not present

## 2024-01-09 DIAGNOSIS — Z1152 Encounter for screening for COVID-19: Secondary | ICD-10-CM

## 2024-01-09 DIAGNOSIS — E7800 Pure hypercholesterolemia, unspecified: Secondary | ICD-10-CM | POA: Diagnosis present

## 2024-01-09 DIAGNOSIS — G2581 Restless legs syndrome: Secondary | ICD-10-CM | POA: Diagnosis present

## 2024-01-09 DIAGNOSIS — R0902 Hypoxemia: Secondary | ICD-10-CM | POA: Diagnosis not present

## 2024-01-09 DIAGNOSIS — Z7401 Bed confinement status: Secondary | ICD-10-CM

## 2024-01-09 DIAGNOSIS — E785 Hyperlipidemia, unspecified: Secondary | ICD-10-CM | POA: Diagnosis not present

## 2024-01-09 DIAGNOSIS — R739 Hyperglycemia, unspecified: Secondary | ICD-10-CM | POA: Diagnosis not present

## 2024-01-09 DIAGNOSIS — E872 Acidosis, unspecified: Secondary | ICD-10-CM | POA: Diagnosis present

## 2024-01-09 DIAGNOSIS — G9341 Metabolic encephalopathy: Secondary | ICD-10-CM | POA: Diagnosis present

## 2024-01-09 DIAGNOSIS — R4182 Altered mental status, unspecified: Secondary | ICD-10-CM | POA: Diagnosis not present

## 2024-01-09 DIAGNOSIS — F0283 Dementia in other diseases classified elsewhere, unspecified severity, with mood disturbance: Secondary | ICD-10-CM | POA: Diagnosis present

## 2024-01-09 DIAGNOSIS — K580 Irritable bowel syndrome with diarrhea: Secondary | ICD-10-CM | POA: Diagnosis present

## 2024-01-09 DIAGNOSIS — J189 Pneumonia, unspecified organism: Secondary | ICD-10-CM | POA: Diagnosis present

## 2024-01-09 DIAGNOSIS — R6520 Severe sepsis without septic shock: Secondary | ICD-10-CM | POA: Diagnosis present

## 2024-01-09 DIAGNOSIS — R7881 Bacteremia: Secondary | ICD-10-CM

## 2024-01-09 DIAGNOSIS — G20A1 Parkinson's disease without dyskinesia, without mention of fluctuations: Secondary | ICD-10-CM | POA: Diagnosis present

## 2024-01-09 DIAGNOSIS — Z833 Family history of diabetes mellitus: Secondary | ICD-10-CM

## 2024-01-09 DIAGNOSIS — Z79899 Other long term (current) drug therapy: Secondary | ICD-10-CM

## 2024-01-09 DIAGNOSIS — G40909 Epilepsy, unspecified, not intractable, without status epilepticus: Secondary | ICD-10-CM | POA: Diagnosis present

## 2024-01-09 LAB — COMPREHENSIVE METABOLIC PANEL
ALT: 18 U/L (ref 0–44)
AST: 102 U/L — ABNORMAL HIGH (ref 15–41)
Albumin: 3 g/dL — ABNORMAL LOW (ref 3.5–5.0)
Alkaline Phosphatase: 88 U/L (ref 38–126)
Anion gap: 16 — ABNORMAL HIGH (ref 5–15)
BUN: 22 mg/dL (ref 8–23)
CO2: 18 mmol/L — ABNORMAL LOW (ref 22–32)
Calcium: 8.9 mg/dL (ref 8.9–10.3)
Chloride: 106 mmol/L (ref 98–111)
Creatinine, Ser: 1.17 mg/dL — ABNORMAL HIGH (ref 0.44–1.00)
GFR, Estimated: 47 mL/min — ABNORMAL LOW (ref >60)
Glucose, Bld: 197 mg/dL — ABNORMAL HIGH (ref 70–99)
Potassium: 3.5 mmol/L (ref 3.5–5.1)
Sodium: 140 mmol/L (ref 135–145)
Total Bilirubin: 0.7 mg/dL (ref 0.0–1.2)
Total Protein: 6 g/dL — ABNORMAL LOW (ref 6.5–8.1)

## 2024-01-09 LAB — CBC WITH DIFFERENTIAL/PLATELET
Abs Immature Granulocytes: 0.1 10*3/uL — ABNORMAL HIGH (ref 0.00–0.07)
Basophils Absolute: 0 10*3/uL (ref 0.0–0.1)
Basophils Relative: 0 %
Eosinophils Absolute: 0 10*3/uL (ref 0.0–0.5)
Eosinophils Relative: 0 %
HCT: 37.5 % (ref 36.0–46.0)
Hemoglobin: 11.8 g/dL — ABNORMAL LOW (ref 12.0–15.0)
Immature Granulocytes: 2 %
Lymphocytes Relative: 3 %
Lymphs Abs: 0.2 10*3/uL — ABNORMAL LOW (ref 0.7–4.0)
MCH: 28.4 pg (ref 26.0–34.0)
MCHC: 31.5 g/dL (ref 30.0–36.0)
MCV: 90.1 fL (ref 80.0–100.0)
Monocytes Absolute: 0.1 10*3/uL (ref 0.1–1.0)
Monocytes Relative: 1 %
Neutro Abs: 5.8 10*3/uL (ref 1.7–7.7)
Neutrophils Relative %: 94 %
Platelets: 172 10*3/uL (ref 150–400)
RBC: 4.16 MIL/uL (ref 3.87–5.11)
RDW: 15.6 % — ABNORMAL HIGH (ref 11.5–15.5)
WBC: 6.2 10*3/uL (ref 4.0–10.5)
nRBC: 0 % (ref 0.0–0.2)

## 2024-01-09 LAB — LIPASE, BLOOD: Lipase: 31 U/L (ref 11–51)

## 2024-01-09 LAB — RESP PANEL BY RT-PCR (RSV, FLU A&B, COVID)  RVPGX2
Influenza A by PCR: NEGATIVE
Influenza B by PCR: NEGATIVE
Resp Syncytial Virus by PCR: NEGATIVE
SARS Coronavirus 2 by RT PCR: NEGATIVE

## 2024-01-09 LAB — LACTIC ACID, PLASMA: Lactic Acid, Venous: 6.2 mmol/L (ref 0.5–1.9)

## 2024-01-09 MED ORDER — SODIUM CHLORIDE 0.9 % IV SOLN
2.0000 g | Freq: Once | INTRAVENOUS | Status: AC
  Administered 2024-01-09: 2 g via INTRAVENOUS
  Filled 2024-01-09: qty 12.5

## 2024-01-09 MED ORDER — LACTATED RINGERS IV BOLUS (SEPSIS)
1000.0000 mL | Freq: Once | INTRAVENOUS | Status: AC
  Administered 2024-01-09: 1000 mL via INTRAVENOUS

## 2024-01-09 MED ORDER — LACTATED RINGERS IV BOLUS (SEPSIS)
250.0000 mL | Freq: Once | INTRAVENOUS | Status: AC
  Administered 2024-01-09: 250 mL via INTRAVENOUS

## 2024-01-09 MED ORDER — VANCOMYCIN HCL IN DEXTROSE 1-5 GM/200ML-% IV SOLN
1000.0000 mg | Freq: Once | INTRAVENOUS | Status: AC
  Administered 2024-01-10: 1000 mg via INTRAVENOUS
  Filled 2024-01-09: qty 200

## 2024-01-09 MED ORDER — METRONIDAZOLE 500 MG/100ML IV SOLN
500.0000 mg | Freq: Once | INTRAVENOUS | Status: AC
  Administered 2024-01-09: 500 mg via INTRAVENOUS
  Filled 2024-01-09: qty 100

## 2024-01-09 MED ORDER — IOHEXOL 300 MG/ML  SOLN
75.0000 mL | Freq: Once | INTRAMUSCULAR | Status: AC | PRN
  Administered 2024-01-09: 75 mL via INTRAVENOUS

## 2024-01-09 NOTE — Consult Note (Signed)
CODE SEPSIS - PHARMACY COMMUNICATION  **Broad Spectrum Antibiotics should be administered within 1 hour of Sepsis diagnosis**  Time Code Sepsis Called/Page Received: 2208  Antibiotics Ordered: vancomycin, cefepime, flagyl  Time of 1st antibiotic administration: 2245  Additional action taken by pharmacy: na  If necessary, Name of Provider/Nurse Contacted: na    Bettey Costa ,PharmD Clinical Pharmacist  01/09/2024  10:13 PM

## 2024-01-09 NOTE — ED Triage Notes (Signed)
Pt to ed from home via [facility] for sick person. Pt has HX of dementia and parkinson. Pt is more altered than normal. Pt has FLU like symptoms and fever at home of 101. 106 HR  29 ETCO2 284BGL  86/52 600 NS  122/72 More alert post fluids. Pt is trying to rip IV out.  185G RFA. New onset of rash on left hip. No med changes.

## 2024-01-09 NOTE — ED Notes (Signed)
Per pt POA Dois Davenport [phone] cannot stay to be with pt. She is giving her daughter Hulan Fess permission to make medical decisions.

## 2024-01-09 NOTE — Sepsis Progress Note (Addendum)
Elink monitoring for the code sepsis protocol.

## 2024-01-09 NOTE — ED Provider Notes (Signed)
[facility] Provider Note    Event Date/Time   First MD Initiated Contact with Patient 01/09/24 2206     (approximate)  History   Chief Complaint: Fever  HPI  Deborah Velazquez is a 78 y.o. female with a past medical anxiety, hyperlipidemia, Parkinson's, CVA, dementia, presents to the emergency department for generalized weakness found to have low blood pressure and a fever.  According to the patient's caregiver who is here with the patient she lives at home with 24-hour care.  He states she has been somewhat more confused today and earlier was complaining of some abdominal pain and had multiple episodes of diarrhea.  Later this evening they took the patient's vital signs at home and noted that her blood pressure was low and that she had a fever so they had her transported to the emergency department.  Here patient is somnolent will awaken somewhat during exam however is not able to provide any history.  Will fall asleep if not actively engaged.  Caregiver denies any vomiting or cough.  Physical Exam   Triage Vital Signs: ED Triage Vitals  Encounter Vitals Group     BP 01/09/24 2019 (!) 82/58     Systolic BP Percentile --      Diastolic BP Percentile --      Pulse Rate 01/09/24 2019 100     Resp 01/09/24 2019 20     Temp 01/09/24 2019 (!) 100.8 F (38.2 C)     Temp Source 01/09/24 2019 Oral     SpO2 01/09/24 2019 94 %     Weight --      Height --      Head Circumference --      Peak Flow --      Pain Score 01/09/24 1943 0     Pain Loc --      Pain Education --      Exclude from Growth Chart --     Most recent vital signs: Vitals:   01/09/24 2019 01/09/24 2210  BP: (!) 82/58 (!) 79/64  Pulse: 100 (!) 101  Resp: 20 16  Temp: (!) 100.8 F (38.2 C)   SpO2: 94% 92%    General: Somnolent.  No distress. CV:  Good peripheral perfusion.  Regular rate and rhythm around 100 bpm. Resp:  Normal effort.  Equal breath sounds bilaterally.  No obvious wheeze  rales or rhonchi. Abd:  No distention.  Soft, nontender.  No reaction to palpation.  ED Results / Procedures / Treatments   EKG  EKG viewed and interpreted by myself shows sinus tachycardia 101 bpm with a narrow QRS, normal axis, normal intervals, nonspecific ST changes.  RADIOLOGY  I have reviewed interpreted chest x-ray images.  Patient appears to have scattered opacities in bilateral lungs. Radiology is read increased interstitial markings may reflect edema versus atypical infection.   MEDICATIONS ORDERED IN ED: Medications  lactated ringers bolus 1,000 mL (has no administration in time range)    And  lactated ringers bolus 1,000 mL (has no administration in time range)    And  lactated ringers bolus 250 mL (has no administration in time range)  ceFEPIme (MAXIPIME) 2 g in sodium chloride 0.9 % 100 mL IVPB (has no administration in time range)  metroNIDAZOLE (FLAGYL) IVPB 500 mg (has no administration in time range)  vancomycin (VANCOCIN) IVPB 1000 mg/200 mL premix (has no administration in time range)     IMPRESSION / MDM / ASSESSMENT AND PLAN /  ED COURSE  I reviewed the triage vital signs and the nursing notes.  Patient's presentation is most consistent with acute presentation with potential threat to life or bodily function.  Patient presents the emergency department for weakness fever abdominal discomfort and diarrhea earlier found to be hypotensive at home.  Patient remains hypotensive in the emergency department.  Lab work has resulted showing a normal CBC with a normal white blood cell count.  Patient's respiratory panel is negative.  Patient's chemistry shows mild dehydration but no other significant finding.  Lactic acid is elevated at 6.2.  Given the patient's hypotension elevated lactic acid and pulse rate with fever concerning for sepsis we will start broad-spectrum antibiotics.  Will begin 30 mL/kg of fluids.  Given the patient's complaint of abdominal pain earlier  with diarrhea we will obtain a CT scan of the abdomen although benign abdomen currently with no reaction to palpation.  Patient will require admission to the hospital service once the patient's emergency department workup is been completed.  CRITICAL CARE Performed by: Minna Antis   Total critical care time: 30 minutes  Critical care time was exclusive of separately billable procedures and treating other patients.  Critical care was necessary to treat or prevent imminent or life-threatening deterioration.  Critical care was time spent personally by me on the following activities: development of treatment plan with patient and/or surrogate as well as nursing, discussions with consultants, evaluation of patient's response to treatment, examination of patient, obtaining history from patient or surrogate, ordering and performing treatments and interventions, ordering and review of laboratory studies, ordering and review of radiographic studies, pulse oximetry and re-evaluation of patient's condition.   FINAL CLINICAL IMPRESSION(S) / ED DIAGNOSES   Sepsis Hypertension  Note:  This document was prepared using Dragon voice recognition software and may include unintentional dictation errors.   Minna Antis, MD 01/13/24 [phone]

## 2024-01-09 NOTE — ED Provider Triage Note (Signed)
Emergency Medicine Provider Triage Evaluation Note  Deborah Velazquez , a 78 y.o. female  was evaluated in triage.  Pt complains of altered mental status, fever, diarrhea, patient is bedbound, 24-hour care at home, family member states that she is not acting normal and temperature was rising quickly..  Review of Systems  Positive:  Negative:   Physical Exam  There were no vitals taken for this visit. Gen:   Awake, no distress   Resp:  Normal effort  MSK:   Moves extremities without difficulty  Other:    Medical Decision Making  Medically screening exam initiated at 7:55 PM.  Appropriate orders placed.  Dajon Silvano Rusk was informed that the remainder of the evaluation will be completed by another provider, this initial triage assessment does not replace that evaluation, and the importance of remaining in the ED until their evaluation is complete.  Sepsis protocols initiated   Faythe Ghee, PA-C 01/09/24 1956

## 2024-01-10 DIAGNOSIS — N179 Acute kidney failure, unspecified: Secondary | ICD-10-CM | POA: Diagnosis not present

## 2024-01-10 DIAGNOSIS — A4150 Gram-negative sepsis, unspecified: Secondary | ICD-10-CM

## 2024-01-10 DIAGNOSIS — F0283 Dementia in other diseases classified elsewhere, unspecified severity, with mood disturbance: Secondary | ICD-10-CM | POA: Diagnosis not present

## 2024-01-10 DIAGNOSIS — R531 Weakness: Secondary | ICD-10-CM | POA: Diagnosis not present

## 2024-01-10 DIAGNOSIS — J9601 Acute respiratory failure with hypoxia: Secondary | ICD-10-CM | POA: Diagnosis not present

## 2024-01-10 DIAGNOSIS — E785 Hyperlipidemia, unspecified: Secondary | ICD-10-CM | POA: Diagnosis not present

## 2024-01-10 DIAGNOSIS — Z743 Need for continuous supervision: Secondary | ICD-10-CM | POA: Diagnosis not present

## 2024-01-10 DIAGNOSIS — K580 Irritable bowel syndrome with diarrhea: Secondary | ICD-10-CM | POA: Diagnosis not present

## 2024-01-10 DIAGNOSIS — I10 Essential (primary) hypertension: Secondary | ICD-10-CM | POA: Diagnosis not present

## 2024-01-10 DIAGNOSIS — B961 Klebsiella pneumoniae [K. pneumoniae] as the cause of diseases classified elsewhere: Secondary | ICD-10-CM | POA: Diagnosis not present

## 2024-01-10 DIAGNOSIS — G40909 Epilepsy, unspecified, not intractable, without status epilepticus: Secondary | ICD-10-CM | POA: Diagnosis not present

## 2024-01-10 DIAGNOSIS — J189 Pneumonia, unspecified organism: Secondary | ICD-10-CM | POA: Diagnosis not present

## 2024-01-10 DIAGNOSIS — G629 Polyneuropathy, unspecified: Secondary | ICD-10-CM | POA: Diagnosis not present

## 2024-01-10 DIAGNOSIS — G9341 Metabolic encephalopathy: Secondary | ICD-10-CM | POA: Diagnosis not present

## 2024-01-10 DIAGNOSIS — A419 Sepsis, unspecified organism: Secondary | ICD-10-CM | POA: Diagnosis not present

## 2024-01-10 DIAGNOSIS — N390 Urinary tract infection, site not specified: Secondary | ICD-10-CM

## 2024-01-10 DIAGNOSIS — R6521 Severe sepsis with septic shock: Secondary | ICD-10-CM | POA: Diagnosis not present

## 2024-01-10 DIAGNOSIS — E7800 Pure hypercholesterolemia, unspecified: Secondary | ICD-10-CM | POA: Diagnosis not present

## 2024-01-10 DIAGNOSIS — Z1152 Encounter for screening for COVID-19: Secondary | ICD-10-CM | POA: Diagnosis not present

## 2024-01-10 DIAGNOSIS — J45909 Unspecified asthma, uncomplicated: Secondary | ICD-10-CM | POA: Diagnosis not present

## 2024-01-10 DIAGNOSIS — G20A1 Parkinson's disease without dyskinesia, without mention of fluctuations: Secondary | ICD-10-CM | POA: Diagnosis not present

## 2024-01-10 DIAGNOSIS — G3183 Dementia with Lewy bodies: Secondary | ICD-10-CM | POA: Diagnosis not present

## 2024-01-10 DIAGNOSIS — G2581 Restless legs syndrome: Secondary | ICD-10-CM | POA: Diagnosis not present

## 2024-01-10 DIAGNOSIS — R4182 Altered mental status, unspecified: Secondary | ICD-10-CM | POA: Diagnosis present

## 2024-01-10 DIAGNOSIS — Z79899 Other long term (current) drug therapy: Secondary | ICD-10-CM | POA: Diagnosis not present

## 2024-01-10 DIAGNOSIS — F32A Depression, unspecified: Secondary | ICD-10-CM | POA: Diagnosis not present

## 2024-01-10 DIAGNOSIS — R6520 Severe sepsis without septic shock: Secondary | ICD-10-CM | POA: Diagnosis not present

## 2024-01-10 DIAGNOSIS — D649 Anemia, unspecified: Secondary | ICD-10-CM | POA: Diagnosis not present

## 2024-01-10 DIAGNOSIS — E872 Acidosis, unspecified: Secondary | ICD-10-CM | POA: Diagnosis not present

## 2024-01-10 DIAGNOSIS — F0284 Dementia in other diseases classified elsewhere, unspecified severity, with anxiety: Secondary | ICD-10-CM | POA: Diagnosis not present

## 2024-01-10 HISTORY — DX: Sepsis, unspecified organism: A41.9

## 2024-01-10 LAB — BASIC METABOLIC PANEL
Anion gap: 10 (ref 5–15)
Anion gap: 14 (ref 5–15)
BUN: 18 mg/dL (ref 8–23)
BUN: 15 mg/dL (ref 8–23)
CO2: 18 mmol/L — ABNORMAL LOW (ref 22–32)
CO2: 21 mmol/L — ABNORMAL LOW (ref 22–32)
Calcium: 8.3 mg/dL — ABNORMAL LOW (ref 8.9–10.3)
Calcium: 8.6 mg/dL — ABNORMAL LOW (ref 8.9–10.3)
Chloride: 114 mmol/L — ABNORMAL HIGH (ref 98–111)
Chloride: 109 mmol/L (ref 98–111)
Creatinine, Ser: 0.78 mg/dL (ref 0.44–1.00)
Creatinine, Ser: 0.67 mg/dL (ref 0.44–1.00)
GFR, Estimated: >60 mL/min (ref >60)
GFR, Estimated: >60 mL/min (ref >60)
Glucose, Bld: 145 mg/dL — ABNORMAL HIGH (ref 70–99)
Glucose, Bld: 116 mg/dL — ABNORMAL HIGH (ref 70–99)
Potassium: 3.5 mmol/L (ref 3.5–5.1)
Potassium: 3.6 mmol/L (ref 3.5–5.1)
Sodium: 142 mmol/L (ref 135–145)
Sodium: 144 mmol/L (ref 135–145)

## 2024-01-10 LAB — BLOOD CULTURE ID PANEL (REFLEXED) - BCID2

## 2024-01-10 LAB — PHOSPHORUS
Phosphorus: 2.5 mg/dL (ref 2.5–4.6)
Phosphorus: 2.8 mg/dL (ref 2.5–4.6)

## 2024-01-10 LAB — URINALYSIS, W/ REFLEX TO CULTURE (INFECTION SUSPECTED)
Bilirubin Urine: NEGATIVE
Glucose, UA: NEGATIVE mg/dL
Ketones, ur: NEGATIVE mg/dL
Nitrite: POSITIVE — AB
Protein, ur: NEGATIVE mg/dL
Specific Gravity, Urine: 1.016 (ref 1.005–1.030)
pH: 6 (ref 5.0–8.0)

## 2024-01-10 LAB — STREP PNEUMONIAE URINARY ANTIGEN: Strep Pneumo Urinary Antigen: NEGATIVE

## 2024-01-10 LAB — BLOOD GAS, VENOUS
Acid-base deficit: 7.7 mmol/L — ABNORMAL HIGH (ref 0.0–2.0)
Acid-base deficit: 2.2 mmol/L — ABNORMAL HIGH (ref 0.0–2.0)
Bicarbonate: 17.5 mmol/L — ABNORMAL LOW (ref 20.0–28.0)
Bicarbonate: 23.7 mmol/L (ref 20.0–28.0)
O2 Saturation: 90.2 %
O2 Saturation: 25.6 %
Patient temperature: 37
Patient temperature: 37
pCO2, Ven: 34 mm[Hg] — ABNORMAL LOW (ref 44–60)
pCO2, Ven: 44 mm[Hg] (ref 44–60)
pH, Ven: 7.32 (ref 7.25–7.43)
pH, Ven: 7.34 (ref 7.25–7.43)
pO2, Ven: 65 mm[Hg] — ABNORMAL HIGH (ref 32–45)
pO2, Ven: <31 mm[Hg] — CL (ref 32–45)

## 2024-01-10 LAB — CBC
HCT: 27.6 % — ABNORMAL LOW (ref 36.0–46.0)
Hemoglobin: 9.2 g/dL — ABNORMAL LOW (ref 12.0–15.0)
MCH: 29 pg (ref 26.0–34.0)
MCHC: 33.3 g/dL (ref 30.0–36.0)
MCV: 87.1 fL (ref 80.0–100.0)
Platelets: 158 10*3/uL (ref 150–400)
RBC: 3.17 MIL/uL — ABNORMAL LOW (ref 3.87–5.11)
RDW: 15.9 % — ABNORMAL HIGH (ref 11.5–15.5)
WBC: 23.7 10*3/uL — ABNORMAL HIGH (ref 4.0–10.5)
nRBC: 0 % (ref 0.0–0.2)

## 2024-01-10 LAB — GLUCOSE, CAPILLARY
Glucose-Capillary: 136 mg/dL — ABNORMAL HIGH (ref 70–99)
Glucose-Capillary: 113 mg/dL — ABNORMAL HIGH (ref 70–99)

## 2024-01-10 LAB — PROCALCITONIN: Procalcitonin: 52.51 ng/mL

## 2024-01-10 LAB — BRAIN NATRIURETIC PEPTIDE: B Natriuretic Peptide: 36.9 pg/mL (ref 0.0–100.0)

## 2024-01-10 LAB — LACTIC ACID, PLASMA
Lactic Acid, Venous: 7 mmol/L (ref 0.5–1.9)
Lactic Acid, Venous: 3.8 mmol/L (ref 0.5–1.9)
Lactic Acid, Venous: >9 mmol/L (ref 0.5–1.9)
Lactic Acid, Venous: 5.6 mmol/L (ref 0.5–1.9)
Lactic Acid, Venous: 6.5 mmol/L (ref 0.5–1.9)
Lactic Acid, Venous: 5.7 mmol/L (ref 0.5–1.9)

## 2024-01-10 LAB — MAGNESIUM
Magnesium: 1.4 mg/dL — ABNORMAL LOW (ref 1.7–2.4)
Magnesium: 2.4 mg/dL (ref 1.7–2.4)

## 2024-01-10 LAB — MRSA NEXT GEN BY PCR, NASAL: MRSA by PCR Next Gen: NOT DETECTED

## 2024-01-10 MED ORDER — SODIUM CHLORIDE 0.9 % IV SOLN
1.0000 g | INTRAVENOUS | Status: DC
  Administered 2024-01-10: 1 g via INTRAVENOUS
  Filled 2024-01-10: qty 10

## 2024-01-10 MED ORDER — POLYETHYLENE GLYCOL 3350 17 G PO PACK: 17.0000 g | PACK | Freq: Every day | ORAL | Status: DC | PRN

## 2024-01-10 MED ORDER — RIVASTIGMINE TARTRATE 1.5 MG PO CAPS
1.5000 mg | ORAL_CAPSULE | Freq: Two times a day (BID) | ORAL | Status: DC
  Administered 2024-01-10: 1.5 mg via ORAL
  Filled 2024-01-10 (×3): qty 1

## 2024-01-10 MED ORDER — CHLORHEXIDINE GLUCONATE CLOTH 2 % EX PADS
6.0000 | MEDICATED_PAD | Freq: Every day | CUTANEOUS | Status: DC
  Administered 2024-01-10 – 2024-01-13 (×4): 6 via TOPICAL

## 2024-01-10 MED ORDER — IPRATROPIUM-ALBUTEROL 0.5-2.5 (3) MG/3ML IN SOLN
3.0000 mL | Freq: Four times a day (QID) | RESPIRATORY_TRACT | Status: DC
  Administered 2024-01-10 (×4): 3 mL via RESPIRATORY_TRACT
  Filled 2024-01-10 (×4): qty 3

## 2024-01-10 MED ORDER — QUETIAPINE FUMARATE 25 MG PO TABS
25.0000 mg | ORAL_TABLET | Freq: Every day | ORAL | Status: DC
  Administered 2024-01-10 – 2024-01-14 (×4): 25 mg via ORAL
  Filled 2024-01-10 (×5): qty 1

## 2024-01-10 MED ORDER — SODIUM CHLORIDE 0.9 % IV SOLN
1.0000 g | Freq: Once | INTRAVENOUS | Status: AC
  Administered 2024-01-10: 1 g via INTRAVENOUS
  Filled 2024-01-10: qty 10

## 2024-01-10 MED ORDER — MAGNESIUM SULFATE 4 GM/100ML IV SOLN
4.0000 g | Freq: Once | INTRAVENOUS | Status: AC
  Administered 2024-01-10: 4 g via INTRAVENOUS
  Filled 2024-01-10: qty 100

## 2024-01-10 MED ORDER — METHYLPREDNISOLONE SODIUM SUCC 125 MG IJ SOLR
125.0000 mg | Freq: Once | INTRAMUSCULAR | Status: AC
Start: 2024-01-10 — End: 2024-01-10
  Administered 2024-01-10: 125 mg via INTRAVENOUS
  Filled 2024-01-10: qty 2

## 2024-01-10 MED ORDER — ORAL CARE MOUTH RINSE
15.0000 mL | OROMUCOSAL | Status: DC
  Administered 2024-01-10 – 2024-01-15 (×19): 15 mL via OROMUCOSAL

## 2024-01-10 MED ORDER — SODIUM CHLORIDE 0.9 % IV SOLN
500.0000 mg | INTRAVENOUS | Status: DC
  Administered 2024-01-10: 500 mg via INTRAVENOUS
  Filled 2024-01-10: qty 5

## 2024-01-10 MED ORDER — NOREPINEPHRINE 4 MG/250ML-% IV SOLN
0.0000 ug/min | INTRAVENOUS | Status: DC
Start: 2024-01-10 — End: 2024-01-11
  Administered 2024-01-10: 2 ug/min via INTRAVENOUS
  Filled 2024-01-10: qty 250

## 2024-01-10 MED ORDER — ACETAMINOPHEN 10 MG/ML IV SOLN
1000.0000 mg | Freq: Four times a day (QID) | INTRAVENOUS | Status: AC
  Administered 2024-01-10 – 2024-01-11 (×4): 1000 mg via INTRAVENOUS
  Filled 2024-01-10 (×4): qty 100

## 2024-01-10 MED ORDER — SODIUM CHLORIDE 0.9 % IV SOLN
2.0000 g | INTRAVENOUS | Status: DC
  Administered 2024-01-11 – 2024-01-15 (×5): 2 g via INTRAVENOUS
  Filled 2024-01-10 (×6): qty 20

## 2024-01-10 MED ORDER — LACTATED RINGERS IV BOLUS
775.0000 mL | Freq: Once | INTRAVENOUS | Status: AC
  Administered 2024-01-10: 775 mL via INTRAVENOUS

## 2024-01-10 MED ORDER — IPRATROPIUM-ALBUTEROL 0.5-2.5 (3) MG/3ML IN SOLN: 3.0000 mL | Freq: Four times a day (QID) | RESPIRATORY_TRACT | Status: DC | PRN

## 2024-01-10 MED ORDER — LACTATED RINGERS IV SOLN: INTRAVENOUS | Status: AC

## 2024-01-10 MED ORDER — LACTATED RINGERS IV BOLUS
1000.0000 mL | Freq: Once | INTRAVENOUS | Status: AC
  Administered 2024-01-10: 1000 mL via INTRAVENOUS

## 2024-01-10 MED ORDER — ONDANSETRON HCL 4 MG/2ML IJ SOLN
4.0000 mg | Freq: Once | INTRAMUSCULAR | Status: AC
  Administered 2024-01-10: 4 mg via INTRAVENOUS
  Filled 2024-01-10: qty 2

## 2024-01-10 MED ORDER — LACTATED RINGERS IV BOLUS
500.0000 mL | Freq: Once | INTRAVENOUS | Status: AC
  Administered 2024-01-10: 500 mL via INTRAVENOUS

## 2024-01-10 MED ORDER — ORAL CARE MOUTH RINSE: 15.0000 mL | OROMUCOSAL | Status: DC | PRN

## 2024-01-10 MED ORDER — HEPARIN SODIUM (PORCINE) 5000 UNIT/ML IJ SOLN
5000.0000 [IU] | Freq: Three times a day (TID) | INTRAMUSCULAR | Status: DC
  Administered 2024-01-10 – 2024-01-15 (×16): 5000 [IU] via SUBCUTANEOUS
  Filled 2024-01-10 (×16): qty 1

## 2024-01-10 MED ORDER — DOCUSATE SODIUM 100 MG PO CAPS: 100.0000 mg | ORAL_CAPSULE | Freq: Two times a day (BID) | ORAL | Status: DC | PRN

## 2024-01-10 MED ORDER — CARBIDOPA-LEVODOPA 25-100 MG PO TABS
2.0000 | ORAL_TABLET | Freq: Every day | ORAL | Status: DC
  Administered 2024-01-10 – 2024-01-15 (×15): 2 via ORAL
  Filled 2024-01-10 (×23): qty 2

## 2024-01-10 NOTE — H&P (Addendum)
NAME:  Deborah Velazquez, MRN:  [id number], DOB:  [birth year], LOS: 0 ADMISSION DATE:  01/09/2024, CONSULTATION DATE: 01/10/2024 REFERRING MD: Farrell Ours, CHIEF COMPLAINT: Altered mental status  HPI  78 y.o female with significant PMH of Parkinson's disease, REM behavior disorder, excessive daytime sleepiness secondary to Parkinson's disease and carbidopa levodopa, dementia due to Parkinson's disease, peripheral neuropathy, anxiety and depression, seizures on Lamictal, RLS, TIA, HLD, and IBS who presented to the ED with chief complaints of altered mental status, hypotension, abdominal pain, diarrhea, generalized weakness and fever.   ED Course: Initial vital signs showed HR of 108 beats/minute, BP 82/58/mm Hg, the RR 20 breaths/minute, and the oxygen saturation 94% on 3L and a temperature of 100.30F (38.2C).   Pertinent Labs/Diagnostics Findings: Na+/ K+: 140/3.5 glucose: 197 BUN/Cr.:C 22/1.17,AST/ALT: 102/18, CO2 18, anion gap 16 WBC: 6., Hgb/Hct: 11.8/37.5 Lactic acid: 6.2 COVID PCR: Negative,  CXR> CT Abd/pelvis> see results below Medication administered in the ED: Patient given 30 cc/kg of fluids and started on broad-spectrum antibiotics Vanco cefepime and Flagyl for sepsis secondary to suspected UTI. Patient remained hypotensive despite IVF boluses therefore was started on Levophed.  PCCM consulted. Disposition: ICU  Past Medical History  Parkinson's disease, REM behavior disorder, excessive daytime sleepiness secondary to Parkinson's disease and carbidopa levodopa, dementia due to Parkinson's disease, peripheral neuropathy, anxiety and depression, seizures on Lamictal, RLS, TIA, HLD, and IBS  Significant Hospital Events   2/15: Admitted to ICU with sepsis secondary to UTI and probable pneumonia  Consults:  PCCM  Procedures:  None  Significant Diagnostic Tests:  2/14: Chest Xray> IMPRESSION: Increased interstitial markings bilaterally, which may reflect edema or  atypical/viral infection.  2/14: CTA abdomen and pelvis> IMPRESSION: 1. Left urothelial thickening and perinephric/peripelvic fat stranding. Correlate with urinalysis for infection. 2. Pancreatic tail peripancreatic fat stranding. Pancreatic tail peripancreatic fat stranding likely related to adjacent left renal infection. Correlate with lipase levels to exclude for possible superimpose acute pancreatitis.  Interim History / Subjective:      Micro Data:  2/14: SARS-CoV-2 PCR> negative 2/14: Influenza PCR> negative 2/14: Blood culture x2> 2/14: Urine Culture> 2/14: MRSA PCR>>  2/14: Strep pneumo urinary antigen>  Antimicrobials:  Vancomycin 2/14 x 1 Cefepime 2/14 x 1 Azithromycin 2/15> Ceftriaxone 2/15>  OBJECTIVE  Blood pressure 114/64, pulse 94, temperature 98.8 F (37.1 C), temperature source Oral, resp. rate 16, SpO2 98%.       Intake/Output Summary (Last 24 hours) at 01/10/2024 0205 Last data filed at 01/10/2024 0040 Gross per 24 hour  Intake 2450 ml  Output --  Net 2450 ml   There were no vitals filed for this visit.  Physical Examination  GENERAL: 78 year-old critically ill patient lying in the bed on HFNC/NRB EYES: PEERLA. No scleral icterus. Extraocular muscles intact.  HEENT: Head atraumatic, normocephalic. Oropharynx and nasopharynx clear.  NECK:  No JVD, supple  LUNGS: Decreased breath sounds bilaterally. Moderate wheezing bilaterally with mild use of accessory muscles of respiration.  CARDIOVASCULAR: S1, S2 normal. No murmurs, rubs, or gallops.  ABDOMEN: Soft, NTND, Left side 1cm deep wound EXTREMITIES: No swelling or erythema.  Capillary refill < 3 seconds in all extremities. Pulses palpable distally. NEUROLOGIC: The patient is oriented to self. No focal neurological deficit appreciated. Cranial nerves are intact.  SKIN: No obvious rash, lesion, or ulcer. Warm to touch     Labs/imaging that I havepersonally reviewed  (right click and "Reselect all  SmartList Selections" daily)     Labs  CBC: Recent Labs  Lab 01/09/24 1952  WBC 6.2  NEUTROABS 5.8  HGB 11.8*  HCT 37.5  MCV 90.1  PLT 172   Basic Metabolic Panel: Recent Labs  Lab 01/09/24 1952  NA 140  K 3.5  CL 106  CO2 18*  GLUCOSE 197*  BUN 22  CREATININE 1.17*  CALCIUM 8.9   GFR: CrCl cannot be calculated (Unknown ideal weight.). Recent Labs  Lab 01/09/24 1952  WBC 6.2  LATICACIDVEN 6.2*   Liver Function Tests: Recent Labs  Lab 01/09/24 1952  AST 102*  ALT 18  ALKPHOS 88  BILITOT 0.7  PROT 6.0*  ALBUMIN 3.0*   Recent Labs  Lab 01/09/24 1952  LIPASE 31   No results for input(s): "AMMONIA" in the last 168 hours.  ABG No results found for: "PHART", "PCO2ART", "PO2ART", "HCO3", "TCO2", "ACIDBASEDEF", "O2SAT"   Coagulation Profile: No results for input(s): "INR", "PROTIME" in the last 168 hours.  Cardiac Enzymes: No results for input(s): "CKTOTAL", "CKMB", "CKMBINDEX", "TROPONINI" in the last 168 hours.  HbA1C: Hgb A1c MFr Bld  Date/Time Value Ref Range Status  04/08/2023 03:17 PM 6.1 4.6 - 6.5 % Final    Comment:    Glycemic Control Guidelines for People with Diabetes:Non Diabetic:  <6%Goal of Therapy: <7%Additional Action Suggested:  >8%   07/16/2022 01:48 PM 6.4 4.6 - 6.5 % Final    Comment:    Glycemic Control Guidelines for People with Diabetes:Non Diabetic:  <6%Goal of Therapy: <7%Additional Action Suggested:  >8%    CBG: No results for input(s): "GLUCAP" in the last 168 hours.  Review of Systems:   Unable to be obtained secondary to the patient's altered mental status.   Past Medical History  She,  has a past medical history of Anxiety, Arthritis, Hypercholesterolemia, Hyperlipemia, Irritable bowel syndrome, Parkinson disease (HCC), Pneumonia due to human metapneumovirus (03/18/2023), Restless leg syndrome, RSD (reflex sympathetic dystrophy), Stroke (HCC), and UTI due to Klebsiella species (05/10/2023).   Surgical History     Past Surgical History:  Procedure Laterality Date   APPENDECTOMY     BACK SURGERY     BREAST BIOPSY Left 08/16/2013   neg   CATARACT EXTRACTION Left    CATARACT EXTRACTION W/PHACO Right 06/01/2020   Procedure: CATARACT EXTRACTION PHACO AND INTRAOCULAR LENS PLACEMENT (IOC) RIGHT;  Surgeon: Lockie Mola, MD;  Location: [facility] ORS;  Service: Ophthalmology;  Laterality: Right;  cde5.26 us01:05.7 ap8.0   COLONOSCOPY     COLONOSCOPY WITH PROPOFOL N/A 06/20/2015   Procedure: COLONOSCOPY WITH PROPOFOL;  Surgeon: Christena Deem, MD;  Location: [facility] ENDOSCOPY;  Service: Endoscopy;  Laterality: N/A;   EYE SURGERY     Cataract in one eye   FRACTURE SURGERY     wrist - right and right leg. (? if any metal)   HIP ARTHROPLASTY Left 05/08/2023   Procedure: ARTHROPLASTY BIPOLAR HIP (HEMIARTHROPLASTY);  Surgeon: Reinaldo Berber, MD;  Location: [facility] ORS;  Service: Orthopedics;  Laterality: Left;   PARS PLANA VITRECTOMY Left 2010     Social History   reports that she has never smoked. She has never used smokeless tobacco. She reports current alcohol use. She reports that she does not use drugs.   Family History   Her family history includes Alzheimer's disease in her father; Breast cancer (age of onset: 52) in her mother; Cancer in her mother; Diabetes in her mother; Heart disease in her maternal aunt and mother.   Allergies Allergies  Allergen Reactions   Pseudoephedrine Hcl Anxiety and  Other (See Comments)    Nervous   Sudafed [Pseudoephedrine] Anxiety and Other (See Comments)    Nervous    Home Medications  Prior to Admission medications   Medication Sig Start Date End Date Taking? Authorizing Provider  aspirin EC 81 MG tablet Take 81 mg by mouth daily. Swallow whole.    [provider]  atorvastatin (LIPITOR) 10 MG tablet TAKE 1 TABLET(10 MG) BY MOUTH AT BEDTIME 05/13/23   Sherlene Shams, MD  carbidopa-levodopa (SINEMET CR) 50-200 MG tablet Take 1 tablet by mouth 5 (five)  times daily. Patient takes at 0700,1030,1400,1730,2200 12/30/19   [provider]  DULoxetine (CYMBALTA) 60 MG capsule Take 60 mg by mouth daily.     [provider]  estradiol (ESTRACE) 0.1 MG/GM vaginal cream INSERT 1 APPLICATORFUL VAGINALLY 2 TIMES A WEEK AS DIRECTED 06/04/23   Sherlene Shams, MD  lamoTRIgine (LAMICTAL) 100 MG tablet Take 100 mg by mouth 2 (two) times daily. 11/12/21   [provider]  nystatin (MYCOSTATIN/NYSTOP) powder Apply 1 application. topically 2 (two) times daily. To rash until resolved. 02/26/22   Sherlene Shams, MD  OVER THE COUNTER MEDICATION Take 1 capsule by mouth in the morning and at bedtime. Doterra Life Long Vitality Pack    [provider]  pregabalin (LYRICA) 150 MG capsule Take 150 mg by mouth 2 (two) times daily.  06/10/19   [provider]  QUEtiapine (SEROQUEL) 25 MG tablet Take 0.5 tablets (12.5 mg total) by mouth at bedtime. Patient taking differently: Take 12.5 mg by mouth 2 (two) times daily. 03/20/23   Alford Highland, MD  sertraline (ZOLOFT) 100 MG tablet Take 200 mg by mouth daily.     [provider]  Scheduled Meds:  heparin  5,000 Units Subcutaneous Q8H   ipratropium-albuterol  3 mL Nebulization Q6H   Continuous Infusions:  azithromycin     cefTRIAXone (ROCEPHIN)  IV     norepinephrine (LEVOPHED) Adult infusion 6 mcg/min (01/10/24 0139)   PRN Meds:.docusate sodium, ipratropium-albuterol, polyethylene glycol   Active Hospital Problem list   See systems below  Assessment & Plan:  #Acute Hypoxic Respiratory Insufficiency Secondary to Suspected Pneumonia PMHx: Asthma now with moderate wheezing and sob on exam -Continue HHFNC overnight, wean FiO2 as tolerated -Supplemental O2 to maintain SpO2 > 90% -Intermittent chest x-ray & ABG PRN -Ensure adequate pulmonary hygiene  -Continue CAP Pna coverage as below -Bronchodilators scheduled & PRN  #Sepsis with Septic Shock due to Suspected UTI,  Pneumonia and possibly Infected Left abdominal wound Initial interventions/workup included: 30cc/kg L of NS/LR & Cefepime/ Vancomycin/Flagyl meets SIRS criteria: Heart Rate 103 beats/minute, Respiratory Rate 20 breaths/minute,Temperature 100.8. Shock Index (SI) -F/u cultures, trend lactic/ PCT, check strep pneumo -Monitor WBC/ fever curve -IV antibiotics: Ceftriaxone & Azithromycin -IVF hydration as needed -Pressors for MAP goal >65 -Strict I/O's   #Acute Metabolic Encephalopathy due to sepsis Hx of Parkinson's disease dementia with Lewy body, anxiety and depression -Provide supportive care -Hold Exelon for now -On Cymbalta and Lyrica for neuropathy   #AKI #AGMA with Lactic Acidosis -trend Lactate -Monitor I&O's / urinary output -Follow BMP -Ensure adequate renal perfusion -Avoid nephrotoxic agents as able -Replace electrolytes as indicated   #Parkinson's Disease #RLS, REM sleep behavior disorder, excessive daytime sleepiness due to PD and PD meds -Continue home Sinemet CR 50/200 -Hold rivastigmine for now -Resume Seroquel once mental status improved -Follows with [facility] neurologist Dr. Sherryll Burger   #Remote history of seizures Per neurology resolved with the  Lamictal -Continue Lamictal  Best practice:  Diet:  Oral Pain/Anxiety/Delirium protocol (if indicated): No VAP protocol (if indicated): Not indicated DVT prophylaxis: Subcutaneous Heparin GI prophylaxis: H2B Glucose control:  SSI Yes Central venous access:  N/A Arterial line:  N/A Foley:  N/A Mobility:  bed rest  PT consulted: N/A Last date of multidisciplinary goals of care discussion [updated family at bedside on 2/15] Code Status:  full code Disposition: ICU   = Goals of Care = Code Status Order: FULL  Primary Emergency Contact: Wise,Linda (HCPOA) Wishes to pursue full aggressive treatment and intervention options, including CPR and intubation  Critical care time: 45 minutes        Webb Silversmith DNP,  CCRN, FNP-C, AGACNP-BC Acute Care & Family Nurse Practitioner [facility] PCCM on call pager [phone]

## 2024-01-10 NOTE — Progress Notes (Signed)
PHARMACY - PHYSICIAN COMMUNICATION CRITICAL VALUE ALERT - BLOOD CULTURE IDENTIFICATION (BCID)  Deborah Velazquez is an 78 y.o. female who presented to [facility] on 01/09/2024 with a chief complaint of pneumonia and wound infection.   Assessment:  Bcx 3 of 4 GNR. BCID identified Klebsiella pneumoniae. No resistance detected. WBC 23.7  Name of physician (or Provider) Contacted: Dr. Larinda Buttery   Current antibiotics: vancomycin, ceftriaxone and azithromycin.  Changes to prescribed antibiotics recommended:  Recommendations accepted by provider  Results for orders placed or performed during the hospital encounter of 01/09/24  Blood Culture ID Panel (Reflexed) (Collected: 01/09/2024  7:52 PM)  Result Value Ref Range   Enterococcus faecalis NOT DETECTED NOT DETECTED   Enterococcus Faecium NOT DETECTED NOT DETECTED   Listeria monocytogenes NOT DETECTED NOT DETECTED   Staphylococcus species NOT DETECTED NOT DETECTED   Staphylococcus aureus (BCID) NOT DETECTED NOT DETECTED   Staphylococcus epidermidis NOT DETECTED NOT DETECTED   Staphylococcus lugdunensis NOT DETECTED NOT DETECTED   Streptococcus species NOT DETECTED NOT DETECTED   Streptococcus agalactiae NOT DETECTED NOT DETECTED   Streptococcus pneumoniae NOT DETECTED NOT DETECTED   Streptococcus pyogenes NOT DETECTED NOT DETECTED   A.calcoaceticus-baumannii NOT DETECTED NOT DETECTED   Bacteroides fragilis NOT DETECTED NOT DETECTED   Enterobacterales DETECTED (A) NOT DETECTED   Enterobacter cloacae complex NOT DETECTED NOT DETECTED   Escherichia coli NOT DETECTED NOT DETECTED   Klebsiella aerogenes NOT DETECTED NOT DETECTED   Klebsiella oxytoca NOT DETECTED NOT DETECTED   Klebsiella pneumoniae DETECTED (A) NOT DETECTED   Proteus species NOT DETECTED NOT DETECTED   Salmonella species NOT DETECTED NOT DETECTED   Serratia marcescens NOT DETECTED NOT DETECTED   Haemophilus influenzae NOT DETECTED NOT DETECTED   Neisseria meningitidis NOT  DETECTED NOT DETECTED   Pseudomonas aeruginosa NOT DETECTED NOT DETECTED   Stenotrophomonas maltophilia NOT DETECTED NOT DETECTED   Candida albicans NOT DETECTED NOT DETECTED   Candida auris NOT DETECTED NOT DETECTED   Candida glabrata NOT DETECTED NOT DETECTED   Candida krusei NOT DETECTED NOT DETECTED   Candida parapsilosis NOT DETECTED NOT DETECTED   Candida tropicalis NOT DETECTED NOT DETECTED   Cryptococcus neoformans/gattii NOT DETECTED NOT DETECTED   CTX-M ESBL NOT DETECTED NOT DETECTED   Carbapenem resistance IMP NOT DETECTED NOT DETECTED   Carbapenem resistance KPC NOT DETECTED NOT DETECTED   Carbapenem resistance NDM NOT DETECTED NOT DETECTED   Carbapenem resist OXA 48 LIKE NOT DETECTED NOT DETECTED   Carbapenem resistance VIM NOT DETECTED NOT DETECTED    Gardner Candle, PharmD, BCPS Clinical Pharmacist 01/10/2024 7:48 AM

## 2024-01-10 NOTE — Progress Notes (Signed)
Lactic acid levels continue to fluctuate up and down, provider notified and ordered LR bolus and labs.

## 2024-01-10 NOTE — ED Provider Notes (Signed)
12:00 AM  Assumed care at shift change.  Patient here for generalized weakness.  Found to be febrile, hypotensive.  They report that she has had confusion from baseline, diarrhea, cough.  Has been hypoxic here.  Does not wear oxygen chronically.  No vomiting.  No history of aspiration.  Patient has lactic of 6.2.  Normal white blood cell count.  CT scan reviewed and interpreted by myself and the radiologist and is concerning for UTI, pyelonephritis.  Also concerning for possible pancreatitis.  Lipase pending.  Getting IV fluids and broad-spectrum antibiotics.   1:00 AM  Pt continues to be hypotensive despite 3 L of IV fluids.  Will start Levophed.  Family reports she is a full code.  Lipase normal.  Abdominal exam benign.  Unlikely acute pancreatitis.  Will obtain cath urine specimen.  Will discuss with critical care for admission.    CRITICAL CARE Performed by: Rochele Raring   Total critical care time: 45 minutes  Critical care time was exclusive of separately billable procedures and treating other patients.  Critical care was necessary to treat or prevent imminent or life-threatening deterioration.  Critical care was time spent personally by me on the following activities: development of treatment plan with patient and/or surrogate as well as nursing, discussions with consultants, evaluation of patient's response to treatment, examination of patient, obtaining history from patient or surrogate, ordering and performing treatments and interventions, ordering and review of laboratory studies, ordering and review of radiographic studies, pulse oximetry and re-evaluation of patient's condition.    Alek Poncedeleon, Layla Maw, DO 01/10/24 [phone]

## 2024-01-10 NOTE — ED Notes (Signed)
This RN attempted twice to do a in and out cath on this pt with no success. This RN and Sydni RN attempted to place a foley twice with no success.

## 2024-01-10 NOTE — Progress Notes (Signed)
eLink Physician-Brief Progress Note Patient Name: Deborah Velazquez DOB: March 12, 1946 MRN: [id number]   Date of Service  01/10/2024  HPI/Events of Note  78 year old female with history of Parkinson's complicated by dementia, sleep disorder, and seizure disorder who presents to the emergency department with altered mentation, low blood pressure, and generalized weakness with fever.  She is found to be in respiratory failure and septic shock thought to be secondary to pneumonia versus urinary tract infection.  On examination, the patient has normal vitals.  Saturating 97% on room air.  Currently on a norepinephrine infusion status post broad-spectrum antibiotics.  Results consistent with anion gap metabolic acidosis, lactic acid elevation, and normocytic anemia.  Urinalysis grossly positive.  Peripancreatic and left perinephric fat stranding concerning for intra-abdominal inflammation.  eICU Interventions  Maintain ceftriaxone and azithromycin.  Norepinephrine as needed to maintain MAP greater than 65  DVT prophylaxis with hep. GI prophylaxis not indicated     Intervention Category Evaluation Type: New Patient Evaluation  Romano Stigger 01/10/2024, 4:03 AM

## 2024-01-10 NOTE — Plan of Care (Signed)
  Problem: Clinical Measurements: Goal: Ability to maintain clinical measurements within normal limits will improve Outcome: Progressing Goal: Diagnostic test results will improve Outcome: Progressing Goal: Respiratory complications will improve Outcome: Progressing Goal: Cardiovascular complication will be avoided Outcome: Progressing   Problem: Activity: Goal: Risk for activity intolerance will decrease Outcome: Progressing   Problem: Nutrition: Goal: Adequate nutrition will be maintained Outcome: Progressing   Problem: Coping: Goal: Level of anxiety will decrease Outcome: Progressing   Problem: Elimination: Goal: Will not experience complications related to bowel motility Outcome: Progressing Goal: Will not experience complications related to urinary retention Outcome: Progressing   Problem: Pain Managment: Goal: General experience of comfort will improve and/or be controlled Outcome: Progressing   Problem: Safety: Goal: Ability to remain free from injury will improve Outcome: Progressing   Problem: Skin Integrity: Goal: Risk for impaired skin integrity will decrease Outcome: Progressing   Problem: Education: Goal: Knowledge of General Education information will improve Description: Including pain rating scale, medication(s)/side effects and non-pharmacologic comfort measures Outcome: Not Progressing   Problem: Health Behavior/Discharge Planning: Goal: Ability to manage health-related needs will improve Outcome: Not Progressing   Problem: Clinical Measurements: Goal: Will remain free from infection Outcome: Not Progressing

## 2024-01-10 NOTE — Consult Note (Signed)
PHARMACY CONSULT NOTE - ELECTROLYTES  Pharmacy Consult for Electrolyte Monitoring and Replacement   Recent Labs:   CrCl cannot be calculated (Unknown ideal weight.). Potassium (mmol/L)  Date Value  01/09/2024 3.5   Magnesium (mg/dL)  Date Value  40/98/1191 2.3   Calcium (mg/dL)  Date Value  47/82/9562 8.9   Albumin (g/dL)  Date Value  13/06/6577 3.0 (L)   Sodium (mmol/L)  Date Value  01/09/2024 140    Assessment  Deborah Velazquez is a 78 y.o. female presenting with altered mental status. PMH significant for dementia and Parkinsons. Pharmacy has been consulted to monitor and replace electrolytes.  Diet: regular MIVF: N/A Pertinent medications: N/A  Goal of Therapy: Electrolytes WNL  Plan:  No replacement currently indicated Check BMP, Mg, Phos with AM labs  Thank you for allowing pharmacy to be a part of this patient's care.  Bettey Costa, PharmD Clinical Pharmacist 01/10/2024 2:47 AM

## 2024-01-10 NOTE — Consult Note (Signed)
PHARMACY CONSULT NOTE - FOLLOW UP  Pharmacy Consult for Electrolyte Monitoring and Replacement   Recent Labs: Potassium (mmol/L)  Date Value  01/10/2024 3.5   Magnesium (mg/dL)  Date Value  38/08/1750 1.4 (L)   Calcium (mg/dL)  Date Value  02/58/5277 8.3 (L)   Albumin (g/dL)  Date Value  82/42/3536 3.0 (L)   Phosphorus (mg/dL)  Date Value  14/43/1540 2.5   Sodium (mmol/L)  Date Value  01/10/2024 142     Assessment: 78 year old female patient with a past medical history of Parkinson's disease on carbidopa and levodopa presenting to [facility] with septic shock secondary to UTI complicated by Klebsiella pneumonia bacteremia.   LR @125  ml/hr.   Goal of Therapy:  WNL  Plan:  Mg 4 g IV x 1 F/u with AM labs.   Ronnald Ramp ,PharmD Clinical Pharmacist 01/10/2024 11:26 AM

## 2024-01-10 NOTE — Plan of Care (Signed)
 Continuing with plan of care.

## 2024-01-11 DIAGNOSIS — A4150 Gram-negative sepsis, unspecified: Secondary | ICD-10-CM | POA: Diagnosis not present

## 2024-01-11 DIAGNOSIS — B961 Klebsiella pneumoniae [K. pneumoniae] as the cause of diseases classified elsewhere: Secondary | ICD-10-CM | POA: Diagnosis not present

## 2024-01-11 DIAGNOSIS — N390 Urinary tract infection, site not specified: Secondary | ICD-10-CM | POA: Diagnosis not present

## 2024-01-11 DIAGNOSIS — E785 Hyperlipidemia, unspecified: Secondary | ICD-10-CM | POA: Diagnosis not present

## 2024-01-11 LAB — CBC
HCT: 28.3 % — ABNORMAL LOW (ref 36.0–46.0)
Hemoglobin: 9.2 g/dL — ABNORMAL LOW (ref 12.0–15.0)
MCH: 28.5 pg (ref 26.0–34.0)
MCHC: 32.5 g/dL (ref 30.0–36.0)
MCV: 87.6 fL (ref 80.0–100.0)
Platelets: 155 10*3/uL (ref 150–400)
RBC: 3.23 MIL/uL — ABNORMAL LOW (ref 3.87–5.11)
RDW: 16.3 % — ABNORMAL HIGH (ref 11.5–15.5)
WBC: 21.7 10*3/uL — ABNORMAL HIGH (ref 4.0–10.5)
nRBC: 0 % (ref 0.0–0.2)

## 2024-01-11 LAB — BASIC METABOLIC PANEL
Anion gap: 7 (ref 5–15)
BUN: 15 mg/dL (ref 8–23)
CO2: 22 mmol/L (ref 22–32)
Calcium: 8.8 mg/dL — ABNORMAL LOW (ref 8.9–10.3)
Chloride: 114 mmol/L — ABNORMAL HIGH (ref 98–111)
Creatinine, Ser: 0.67 mg/dL (ref 0.44–1.00)
GFR, Estimated: >60 mL/min (ref >60)
Glucose, Bld: 88 mg/dL (ref 70–99)
Potassium: 3.5 mmol/L (ref 3.5–5.1)
Sodium: 143 mmol/L (ref 135–145)

## 2024-01-11 LAB — PHOSPHORUS: Phosphorus: 2.5 mg/dL (ref 2.5–4.6)

## 2024-01-11 LAB — MAGNESIUM: Magnesium: 2.1 mg/dL (ref 1.7–2.4)

## 2024-01-11 LAB — LACTIC ACID, PLASMA
Lactic Acid, Venous: 1.7 mmol/L (ref 0.5–1.9)
Lactic Acid, Venous: 2.3 mmol/L (ref 0.5–1.9)

## 2024-01-11 MED ORDER — LACTATED RINGERS IV BOLUS
500.0000 mL | Freq: Once | INTRAVENOUS | Status: AC
  Administered 2024-01-11: 500 mL via INTRAVENOUS

## 2024-01-11 MED ORDER — POTASSIUM CHLORIDE 10 MEQ/100ML IV SOLN
10.0000 meq | INTRAVENOUS | Status: AC
  Administered 2024-01-11 (×2): 10 meq via INTRAVENOUS
  Filled 2024-01-11 (×2): qty 100

## 2024-01-11 MED ORDER — ENSURE ENLIVE PO LIQD
237.0000 mL | Freq: Two times a day (BID) | ORAL | Status: DC
  Administered 2024-01-12 – 2024-01-15 (×5): 237 mL via ORAL

## 2024-01-11 MED ORDER — LACTATED RINGERS IV BOLUS: 250.0000 mL | Freq: Once | INTRAVENOUS | Status: DC

## 2024-01-11 MED ORDER — POTASSIUM CHLORIDE CRYS ER 20 MEQ PO TBCR: 20.0000 meq | EXTENDED_RELEASE_TABLET | Freq: Once | ORAL | Status: DC

## 2024-01-11 MED ORDER — IPRATROPIUM-ALBUTEROL 0.5-2.5 (3) MG/3ML IN SOLN
3.0000 mL | Freq: Three times a day (TID) | RESPIRATORY_TRACT | Status: DC
  Administered 2024-01-11 – 2024-01-12 (×4): 3 mL via RESPIRATORY_TRACT
  Filled 2024-01-11 (×4): qty 3

## 2024-01-11 NOTE — Progress Notes (Signed)
NAME:  Deborah Velazquez, MRN:  [id number], DOB:  March 11, 1946, LOS: 1 ADMISSION DATE:  01/09/2024  History of Present Illness:  78 y.o female with significant PMH of Parkinson's disease, REM behavior disorder, excessive daytime sleepiness secondary to Parkinson's disease and carbidopa levodopa, dementia due to Parkinson's disease, peripheral neuropathy, anxiety and depression, seizures on Lamictal, RLS, TIA, HLD, and IBS who presented to the ED with chief complaints of altered mental status, hypotension, abdominal pain, diarrhea, generalized weakness and fever.   ED Course: Initial vital signs showed HR of 108 beats/minute, BP 82/58/mm Hg, the RR 20 breaths/minute, and the oxygen saturation 94% on 3L and a temperature of 100.78F (38.2C).   Pertinent Labs/Diagnostics Findings: Na+/ K+: 140/3.5 glucose: 197 BUN/Cr.:C 22/1.17,AST/ALT: 102/18, CO2 18, anion gap 16 WBC: 6., Hgb/Hct: 11.8/37.5 Lactic acid: 6.2 COVID PCR: Negative,  CXR> CT Abd/pelvis> see results below Medication administered in the ED: Patient given 30 cc/kg of fluids and started on broad-spectrum antibiotics Vanco cefepime and Flagyl for sepsis secondary to suspected UTI. Patient remained hypotensive despite IVF boluses therefore was started on Levophed.  PCCM consulted. Disposition: ICU  Pertinent  Medical History  Parkinson's disease, REM behavior disorder, excessive daytime sleepiness secondary to Parkinson's disease and carbidopa levodopa, dementia due to Parkinson's disease, peripheral neuropathy, anxiety and depression, seizures on Lamictal, RLS, TIA, HLD, and IBS   Significant Hospital Events: Including procedures, antibiotic start and stop dates in addition to other pertinent events      2/15: Admitted to ICU with sepsis secondary to UTI and probable pneumonia   Interim History / Subjective:  Patient mental status improved, able to answer questions coherently.   Objective   Blood pressure 111/81, pulse 84, temperature  98 F (36.7 C), temperature source Axillary, resp. rate (!) 22, height 5\' 5"  (1.651 m), weight 76.1 kg, SpO2 92%.        Intake/Output Summary (Last 24 hours) at 01/11/2024 1321 Last data filed at 01/11/2024 1100 Gross per 24 hour  Intake 3332.42 ml  Output 1300 ml  Net 2032.42 ml   Filed Weights   01/10/24 0400 01/11/24 0500  Weight: 75 kg 76.1 kg    Examination: General: Alert and awake, appears to be purposeful. HENT: Reactive pupils Lungs: Clear bilateral air entry Cardiovascular: Normal S1, normal S2, regular rate and rhythm Abdomen: Soft nontender nondistended positive bowel sounds Extremities: Warm well-perfused no edema   Assessment & Plan:  78 year old female patient with a past medical history of Parkinson's disease on carbidopa and levodopa presenting to [facility] with septic shock secondary to UTI complicated by Klebsiella pneumonia bacteremia. Hemodynamically improving.   # Septic shock secondary to UTI with Klebsiella pneumonia bacteremia. # Parkinson disease complicated by dementia and RLS #Lesion under left breast appears healing well, continue to monitor.  # Anxiety/depression, seizures on Lamictal # Hyperlipidemia   She reports that her baseline mental status fluctuates she can have days where she can sleep for 18 to 20 hours and days where she can be propped up talking walking with a walker.  She said that this is not unusual for her.  She has 24/7 care at home.  She sees an neurologist and a psychiatrist as outpatient.   Neuro: Resume home Seroquel, carbidopa levodopa dose and restart. CVS: Restart aspirin and atorvastatin.  Low-dose nor epi for MAP greater than 65. Pulmonary: No issues GI: Regular diet.  Laxatives.  Ensure bowel movement. Renal: no issues ID Continue with Ceftriaxone for now for a total 10 to 14  days.  Heme: heparin subcu for DVT prophylaxis  Best Practice (right click and "Reselect all SmartList Selections" daily)   Diet/type: Regular  consistency (see orders) DVT prophylaxis prophylactic heparin  Pressure ulcer(s): N/A GI prophylaxis: N/A Lines: N/A Foley:  N/A Code Status:  full code  Last date of multidisciplinary goals of care discussion [01/11/2024]  Dispo: [facility] service.   Critical care time: 63  Janann Colonel, MD [facility] 01/11/2024 1:49 PM

## 2024-01-11 NOTE — Progress Notes (Signed)
Lactic acid 2.3; provider notified.

## 2024-01-11 NOTE — Consult Note (Signed)
PHARMACY CONSULT NOTE - FOLLOW UP  Pharmacy Consult for Electrolyte Monitoring and Replacement   Recent Labs: Potassium (mmol/L)  Date Value  01/11/2024 3.5   Magnesium (mg/dL)  Date Value  40/08/2724 2.1   Calcium (mg/dL)  Date Value  36/64/4034 8.8 (L)   Albumin (g/dL)  Date Value  74/25/9563 3.0 (L)   Phosphorus (mg/dL)  Date Value  87/56/4332 2.5   Sodium (mmol/L)  Date Value  01/11/2024 143     Assessment: 78 year old female patient with a past medical history of Parkinson's disease on carbidopa and levodopa presenting to [facility] with septic shock secondary to UTI complicated by Klebsiella pneumonia bacteremia.   LR @125  ml/hr. - completed LR bolus ordered for 2/16.   Goal of Therapy:  WNL  Plan:  KCL 20 mEq x 1 PO F/u with AM labs.   Ronnald Ramp ,PharmD Clinical Pharmacist 01/11/2024 7:55 AM

## 2024-01-11 NOTE — Plan of Care (Signed)
  Problem: Clinical Measurements: Goal: Ability to maintain clinical measurements within normal limits will improve Outcome: Progressing Goal: Will remain free from infection Outcome: Progressing Goal: Diagnostic test results will improve Outcome: Progressing Goal: Respiratory complications will improve Outcome: Progressing Goal: Cardiovascular complication will be avoided Outcome: Progressing   Problem: Activity: Goal: Risk for activity intolerance will decrease Outcome: Progressing   Problem: Nutrition: Goal: Adequate nutrition will be maintained Outcome: Progressing   Problem: Coping: Goal: Level of anxiety will decrease Outcome: Progressing   Problem: Elimination: Goal: Will not experience complications related to bowel motility Outcome: Progressing Goal: Will not experience complications related to urinary retention Outcome: Progressing   Problem: Pain Managment: Goal: General experience of comfort will improve and/or be controlled Outcome: Progressing   Problem: Safety: Goal: Ability to remain free from injury will improve Outcome: Progressing   Problem: Skin Integrity: Goal: Risk for impaired skin integrity will decrease Outcome: Progressing   Problem: Education: Goal: Knowledge of General Education information will improve Description: Including pain rating scale, medication(s)/side effects and non-pharmacologic comfort measures Outcome: Not Progressing   Problem: Health Behavior/Discharge Planning: Goal: Ability to manage health-related needs will improve Outcome: Not Progressing

## 2024-01-11 NOTE — Plan of Care (Signed)
 Continuing with plan of care.

## 2024-01-12 DIAGNOSIS — A419 Sepsis, unspecified organism: Secondary | ICD-10-CM

## 2024-01-12 DIAGNOSIS — N390 Urinary tract infection, site not specified: Secondary | ICD-10-CM | POA: Diagnosis not present

## 2024-01-12 LAB — CBC
HCT: 30 % — ABNORMAL LOW (ref 36.0–46.0)
Hemoglobin: 9.8 g/dL — ABNORMAL LOW (ref 12.0–15.0)
MCH: 28.3 pg (ref 26.0–34.0)
MCHC: 32.7 g/dL (ref 30.0–36.0)
MCV: 86.7 fL (ref 80.0–100.0)
Platelets: 171 10*3/uL (ref 150–400)
RBC: 3.46 MIL/uL — ABNORMAL LOW (ref 3.87–5.11)
RDW: 15.9 % — ABNORMAL HIGH (ref 11.5–15.5)
WBC: 21.9 10*3/uL — ABNORMAL HIGH (ref 4.0–10.5)
nRBC: 0 % (ref 0.0–0.2)

## 2024-01-12 LAB — CULTURE, BLOOD (ROUTINE X 2): Special Requests: ADEQUATE

## 2024-01-12 LAB — BASIC METABOLIC PANEL
Anion gap: 9 (ref 5–15)
BUN: 9 mg/dL (ref 8–23)
CO2: 22 mmol/L (ref 22–32)
Calcium: 8.6 mg/dL — ABNORMAL LOW (ref 8.9–10.3)
Chloride: 109 mmol/L (ref 98–111)
Creatinine, Ser: 0.6 mg/dL (ref 0.44–1.00)
GFR, Estimated: >60 mL/min (ref >60)
Glucose, Bld: 99 mg/dL (ref 70–99)
Potassium: 3.3 mmol/L — ABNORMAL LOW (ref 3.5–5.1)
Sodium: 140 mmol/L (ref 135–145)

## 2024-01-12 LAB — MAGNESIUM: Magnesium: 1.8 mg/dL (ref 1.7–2.4)

## 2024-01-12 LAB — URINE CULTURE: Culture: 20000 — AB

## 2024-01-12 LAB — PHOSPHORUS: Phosphorus: 2.4 mg/dL — ABNORMAL LOW (ref 2.5–4.6)

## 2024-01-12 MED ORDER — IPRATROPIUM-ALBUTEROL 0.5-2.5 (3) MG/3ML IN SOLN
3.0000 mL | Freq: Two times a day (BID) | RESPIRATORY_TRACT | Status: DC
  Administered 2024-01-13: 3 mL via RESPIRATORY_TRACT
  Filled 2024-01-12: qty 3

## 2024-01-12 MED ORDER — ATORVASTATIN CALCIUM 20 MG PO TABS
20.0000 mg | ORAL_TABLET | Freq: Every day | ORAL | Status: DC
  Administered 2024-01-12 – 2024-01-14 (×3): 20 mg via ORAL
  Filled 2024-01-12 (×3): qty 1

## 2024-01-12 MED ORDER — POTASSIUM CHLORIDE CRYS ER 20 MEQ PO TBCR
40.0000 meq | EXTENDED_RELEASE_TABLET | Freq: Once | ORAL | Status: AC
  Administered 2024-01-12: 40 meq via ORAL
  Filled 2024-01-12: qty 2

## 2024-01-12 NOTE — Evaluation (Signed)
Physical Therapy Evaluation Patient Details Name: Deborah Velazquez MRN: [id number] DOB: 07-03-1946 Today's Date: 01/12/2024  History of Present Illness  78 year old female patient with a past medical history of Parkinson's disease on carbidopa and levodopa presenting to [facility] with septic shock secondary to UTI complicated by Klebsiella pneumonia bacteremia.  Clinical Impression  Pt laying in bed in NAD but wanting to take a nap and needing extra cuing/encouragement from caregivers and this PT to participate with some supine exercises and agree to do some minimal sitting EOB mobility. She displayed b/l LE rigidity (L>R) with supine exercises but was able to AROM (and some against resistance) with increased time/cuing.  Pt struggled with sitting EOB balance, but with assist to place hands and square to EOB did maintain static sitting for 10-30 seconds with close CGA and typically needing min/modA to keep from leaning back and to the R. Pt will benefit from continue PT to address functional limitations.       If plan is discharge home, recommend the following: Two people to help with walking and/or transfers;Two people to help with bathing/dressing/bathroom;Assistance with cooking/housework;Assist for transportation   Can travel by private vehicle        Equipment Recommendations    Recommendations for Other Services       Functional Status Assessment Patient has had a recent decline in their functional status and demonstrates the ability to make significant improvements in function in a reasonable and predictable amount of time.     Precautions / Restrictions Precautions Precautions: Fall Restrictions Weight Bearing Restrictions Per Provider Order: No      Mobility  Bed Mobility Overal bed mobility: Needs Assistance Bed Mobility: Sit to Supine, Rolling, Sidelying to Sit Rolling: Mod assist Sidelying to sit: Max assist   Sit to supine: Max assist   General bed mobility comments:  Pt showed some effort but ultimately needed heavy assist    Transfers                   General transfer comment: Pt refuses and likely would have struggled a lot had we tried    Ambulation/Gait                  Stairs            Wheelchair Mobility     Tilt Bed    Modified Rankin (Stroke Patients Only)       Balance Overall balance assessment: Needs assistance Sitting-balance support: Bilateral upper extremity supported Sitting balance-Leahy Scale: Poor Sitting balance - Comments: Pt initially falling back and to the R.  Cuing and assist to use bed (pushing on surface and holding rail) but still struggling to maintain balance for more than a few seconds.  Pt requesting to lay back down after ~3 minutes of EOB sitting and weight shifting effort.                                     Pertinent Vitals/Pain Pain Assessment Pain Assessment: No/denies pain    Home Living Family/patient expects to be discharged to:: Private residence Living Arrangements: Non-relatives/Friends;Spouse/significant other Available Help at Discharge: Family;Personal care attendant;Available 24 hours/day   Home Access: Ramped entrance       Home Layout: One level Home Equipment: Agricultural consultant (2 wheels);Rollator (4 wheels);BSC/3in1;Wheelchair - manual;Shower seat;Hospital bed;Hand held shower head;Grab bars - tub/shower;Shower seat - built in (forearm Parkinson's walker, waterproof walker  for in shower)      Prior Function Prior Level of Function : Needs assist  Cognitive Assist : Mobility (cognitive);ADLs (cognitive) Mobility (Cognitive): Set up cues (apparently good and bad days, sometimes can hardly walk, sometimes can go 50-160ft in the home (aides always with her while up)) ADLs (Cognitive): Intermittent cues (has 24/7 assist, depending on good vs bad day needing minimal to nearly dependent assist) Physical Assist : Mobility (physical);ADLs  (physical) Mobility (physical): Bed mobility;Transfers;Gait           Extremity/Trunk Assessment   Upper Extremity Assessment Upper Extremity Assessment: Generalized weakness    Lower Extremity Assessment Lower Extremity Assessment: Generalized weakness (b/l LE rigidity, L >R)       Communication   Communication Communication: No apparent difficulties    Cognition Arousal: Alert Behavior During Therapy: Anxious   PT - Cognitive impairments: History of cognitive impairments                       PT - Cognition Comments: Pt able to follow basic cuing, not interested in really pushing herself today         Cueing       General Comments General comments (skin integrity, edema, etc.): Pt apparently has days where she can be up and active and days where she may sleep all but a few hours.  Pt was wanting to take a nap when PT arrived, agreed to some light activity but needing encouargement from caregivers and this PT to do limited exercises/bed mobility    Exercises General Exercises - Lower Extremity Ankle Circles/Pumps: AROM, AAROM, 10 reps Heel Slides: AROM, AAROM, 10 reps (with resisted leg ext) Hip ABduction/ADduction: AROM, AAROM, 10 reps (better strength/QofM on R)   Assessment/Plan    PT Assessment Patient needs continued PT services  PT Problem List Decreased strength;Decreased range of motion;Decreased activity tolerance;Decreased balance;Decreased mobility;Decreased coordination;Decreased knowledge of use of DME;Decreased cognition;Decreased safety awareness;Decreased knowledge of precautions       PT Treatment Interventions DME instruction;Gait training;Functional mobility training;Therapeutic activities;Therapeutic exercise;Balance training;Neuromuscular re-education;Cognitive remediation;Patient/family education;Wheelchair mobility training    PT Goals (Current goals can be found in the Care Plan section)  Acute Rehab PT Goals Patient Stated  Goal: go home PT Goal Formulation: With patient Time For Goal Achievement: 01/25/24 Potential to Achieve Goals: Fair    Frequency Min 1X/week     Co-evaluation               AM-PAC PT "6 Clicks" Mobility  Outcome Measure Help needed turning from your back to your side while in a flat bed without using bedrails?: A Lot Help needed moving from lying on your back to sitting on the side of a flat bed without using bedrails?: A Lot Help needed moving to and from a bed to a chair (including a wheelchair)?: Total Help needed standing up from a chair using your arms (e.g., wheelchair or bedside chair)?: Total Help needed to walk in hospital room?: Total Help needed climbing 3-5 steps with a railing? : Total 6 Click Score: 8    End of Session   Activity Tolerance: Patient limited by fatigue Patient left: with bed alarm set;with call Cloe/phone within reach;with family/visitor present Nurse Communication: Mobility status PT Visit Diagnosis: Muscle weakness (generalized) (M62.81);Difficulty in walking, not elsewhere classified (R26.2)    Time: 4098-1191 PT Time Calculation (min) (ACUTE ONLY): 27 min   Charges:   PT Evaluation $PT Eval Low Complexity: 1 Low PT Treatments $  Therapeutic Exercise: 8-22 mins PT General Charges $$ ACUTE PT VISIT: 1 Visit         Malachi Pro, DPT 01/12/2024, 12:52 PM

## 2024-01-12 NOTE — Progress Notes (Signed)
Progress Note   Patient: Deborah Velazquez ZOX:[id number] DOB: Sep 25, 1946 DOA: 01/09/2024     2 DOS: the patient was seen and examined on 01/12/2024   Brief hospital course: 78 y.o female with significant PMH of Parkinson's disease, REM behavior disorder, excessive daytime sleepiness secondary to Parkinson's disease and carbidopa levodopa, dementia due to Parkinson's disease, peripheral neuropathy, anxiety and depression, seizures on Lamictal, RLS, TIA, HLD, and IBS who presented to the ED with chief complaints of altered mental status, hypotension, abdominal pain, diarrhea, generalized weakness and fever.   Assessment and Plan:  Septic shock secondary to UTI with Klebsiella pneumonia bacteremia. Patient required vasopressor support in the ICU Has been weaned off Urine culture growing Enterococcus faecalis Blood culture growing Klebsiella pneumoniae Blood cultures repeated Continue current antibiotics  Acute hypoxic respiratory failure secondary to suspected pneumonia On presentation patient required high flow nasal oxygenation which has now been weaned down to intranasal oxygen Continue current antibiotics: Ceftriaxone Has completed a course of azithromycin Continue to monitor oxygen requirement closely Discussed with ICU team  Parkinson disease complicated by dementia and RLS Continue Sinemet IR  Lesion under left breast appears healing well This does not look infected Continue to monitor   Anxiety/depression,  Continue Seroquel  Seizures on Lamictal Continue Lamictal   Hyperlipidemia Continue statin therapy  Heme: heparin subcu for DVT prophylaxis   Subjective:  Patient seen and examined at bedside this morning In the presence of the family Denies nausea vomiting abdominal pain or chest pain Still requiring intranasal oxygenation Blood cultures have been requested but are pending  Physical Exam:  GENERAL: Elderly female laying in bed on intranasal oxygen EYES:  PEERLA. No scleral icterus. Extraocular muscles intact.  NECK:  No JVD, supple  LUNGS: Decreased air entry bilaterally especially at the bases CARDIOVASCULAR: S1, S2 normal. No murmurs, rubs, or gallops.  ABDOMEN: Soft, nontender, healing wound found that the left breast EXTREMITIES: No swelling or erythema.   NEUROLOGIC: The patient is oriented to self. No focal neurological deficit  SKIN: No obvious rash, lesion, or ulcer. Warm to touch  Vitals:   01/12/24 0500 01/12/24 0628 01/12/24 0836 01/12/24 1201  BP:   117/63 114/66  Pulse:  72 76   Resp:  19 18 (!) 24  Temp:  97.9 F (36.6 C) 98.1 F (36.7 C) 98 F (36.7 C)  TempSrc:  Oral Oral Oral  SpO2:  96% 94% 97%  Weight: 76 kg     Height:        Data Reviewed: I have reviewed patient's CT scan of the abdomen that showed a left uroepithelial thickening.    Latest Ref Rng & Units 01/12/2024    5:54 AM 01/11/2024    5:56 AM 01/10/2024    7:20 AM  CBC  WBC 4.0 - 10.5 K/uL 21.9  21.7  23.7   Hemoglobin 12.0 - 15.0 g/dL 9.8  9.2  9.2   Hematocrit 36.0 - 46.0 % 30.0  28.3  27.6   Platelets 150 - 400 K/uL 171  155  158        Latest Ref Rng & Units 01/12/2024    5:54 AM 01/11/2024    5:56 AM 01/10/2024    7:36 PM  BMP  Glucose 70 - 99 mg/dL 99  88  811   BUN 8 - 23 mg/dL 9  15  15    Creatinine 0.44 - 1.00 mg/dL 9.14  7.82  9.56   Sodium 135 - 145 mmol/L 140  143  144   Potassium 3.5 - 5.1 mmol/L 3.3  3.5  3.6   Chloride 98 - 111 mmol/L 109  114  109   CO2 22 - 32 mmol/L 22  22  21    Calcium 8.9 - 10.3 mg/dL 8.6  8.8  8.6      Family Communication: Discussed with family present at bedside  Disposition: Pending antibiotics as well as repeat culture results and subsequent discharge possibly to rehab or home health  Time spent: 55 minutes  Author: Loyce Dys, MD 01/12/2024 5:29 PM  For on call review [email or web address].

## 2024-01-12 NOTE — Consult Note (Signed)
PHARMACY CONSULT NOTE - FOLLOW UP  Pharmacy Consult for Electrolyte Monitoring and Replacement   Recent Labs: Potassium (mmol/L)  Date Value  01/12/2024 3.3 (L)   Magnesium (mg/dL)  Date Value  04/54/0981 1.8   Calcium (mg/dL)  Date Value  19/14/7829 8.6 (L)   Albumin (g/dL)  Date Value  56/21/3086 3.0 (L)   Phosphorus (mg/dL)  Date Value  57/84/6962 2.4 (L)   Sodium (mmol/L)  Date Value  01/12/2024 140     Assessment: 78 year old female patient with a past medical history of Parkinson's disease on carbidopa and levodopa presenting to [facility] with septic shock secondary to UTI complicated by Klebsiella pneumonia bacteremia.    Goal of Therapy:  Electrolytes WNL  Plan:  Kcl po x 1 dose DC CCM consult (patient now on 2A) and follow up peripherally   Marcos Ruelas Rodriguez-Guzman PharmD, BCPS 01/12/2024 7:29 AM

## 2024-01-12 NOTE — Progress Notes (Signed)
OT Cancellation Note  Patient Details Name: Deborah Velazquez MRN: [id number] DOB: [birth year]   Cancelled Treatment:    Reason Eval/Treat Not Completed: Fatigue/lethargy limiting ability to participate. Consult received, chart reviewed. Upon attempt, pt endorsing fatigue. Visitor present provides some PLOF information and requesting return next morning for assessment to allow time to rest today.  Arman Filter., MPH, MS, OTR/L ascom [phone] 01/12/24, 1:29 PM

## 2024-01-13 DIAGNOSIS — N390 Urinary tract infection, site not specified: Secondary | ICD-10-CM | POA: Diagnosis not present

## 2024-01-13 DIAGNOSIS — A419 Sepsis, unspecified organism: Secondary | ICD-10-CM | POA: Diagnosis not present

## 2024-01-13 LAB — BASIC METABOLIC PANEL
Anion gap: 9 (ref 5–15)
BUN: 6 mg/dL — ABNORMAL LOW (ref 8–23)
CO2: 22 mmol/L (ref 22–32)
Calcium: 8.9 mg/dL (ref 8.9–10.3)
Chloride: 109 mmol/L (ref 98–111)
Creatinine, Ser: 0.55 mg/dL (ref 0.44–1.00)
GFR, Estimated: >60 mL/min (ref >60)
Glucose, Bld: 102 mg/dL — ABNORMAL HIGH (ref 70–99)
Potassium: 3.3 mmol/L — ABNORMAL LOW (ref 3.5–5.1)
Sodium: 140 mmol/L (ref 135–145)

## 2024-01-13 LAB — CBC WITH DIFFERENTIAL/PLATELET
Abs Immature Granulocytes: 0.1 10*3/uL — ABNORMAL HIGH (ref 0.00–0.07)
Basophils Absolute: 0.1 10*3/uL (ref 0.0–0.1)
Basophils Relative: 0 %
Eosinophils Absolute: 0.3 10*3/uL (ref 0.0–0.5)
Eosinophils Relative: 2 %
HCT: 30.1 % — ABNORMAL LOW (ref 36.0–46.0)
Hemoglobin: 9.9 g/dL — ABNORMAL LOW (ref 12.0–15.0)
Immature Granulocytes: 1 %
Lymphocytes Relative: 13 %
Lymphs Abs: 1.7 10*3/uL (ref 0.7–4.0)
MCH: 27.9 pg (ref 26.0–34.0)
MCHC: 32.9 g/dL (ref 30.0–36.0)
MCV: 84.8 fL (ref 80.0–100.0)
Monocytes Absolute: 0.6 10*3/uL (ref 0.1–1.0)
Monocytes Relative: 5 %
Neutro Abs: 10.6 10*3/uL — ABNORMAL HIGH (ref 1.7–7.7)
Neutrophils Relative %: 79 %
Platelets: 187 10*3/uL (ref 150–400)
RBC: 3.55 MIL/uL — ABNORMAL LOW (ref 3.87–5.11)
RDW: 15.7 % — ABNORMAL HIGH (ref 11.5–15.5)
WBC: 13.3 10*3/uL — ABNORMAL HIGH (ref 4.0–10.5)
nRBC: 0 % (ref 0.0–0.2)

## 2024-01-13 MED ORDER — LAMOTRIGINE 100 MG PO TABS
100.0000 mg | ORAL_TABLET | Freq: Two times a day (BID) | ORAL | Status: DC
  Administered 2024-01-13 – 2024-01-15 (×4): 100 mg via ORAL
  Filled 2024-01-13 (×4): qty 1

## 2024-01-13 MED ORDER — ASPIRIN 81 MG PO TBEC
81.0000 mg | DELAYED_RELEASE_TABLET | Freq: Every day | ORAL | Status: DC
  Administered 2024-01-13 – 2024-01-15 (×2): 81 mg via ORAL
  Filled 2024-01-13 (×2): qty 1

## 2024-01-13 MED ORDER — DULOXETINE HCL 30 MG PO CPEP
60.0000 mg | ORAL_CAPSULE | Freq: Every day | ORAL | Status: DC
  Administered 2024-01-13 – 2024-01-15 (×2): 60 mg via ORAL
  Filled 2024-01-13 (×2): qty 2

## 2024-01-13 MED ORDER — RIVASTIGMINE TARTRATE 1.5 MG PO CAPS
1.5000 mg | ORAL_CAPSULE | Freq: Two times a day (BID) | ORAL | Status: DC
  Administered 2024-01-13 (×2): 1.5 mg via ORAL
  Filled 2024-01-13 (×3): qty 1

## 2024-01-13 MED ORDER — POTASSIUM CHLORIDE CRYS ER 20 MEQ PO TBCR
40.0000 meq | EXTENDED_RELEASE_TABLET | ORAL | Status: AC
Start: 2024-01-13 — End: 2024-01-13
  Administered 2024-01-13 (×2): 40 meq via ORAL
  Filled 2024-01-13 (×2): qty 2

## 2024-01-13 MED ORDER — PREGABALIN 75 MG PO CAPS
150.0000 mg | ORAL_CAPSULE | Freq: Two times a day (BID) | ORAL | Status: DC
  Administered 2024-01-13 – 2024-01-15 (×4): 150 mg via ORAL
  Filled 2024-01-13 (×4): qty 2

## 2024-01-13 NOTE — Plan of Care (Signed)

## 2024-01-13 NOTE — Evaluation (Signed)
Occupational Therapy Evaluation Patient Details Name: Deborah Velazquez MRN: [id number] DOB: 1946/10/23 Today's Date: 01/13/2024   History of Present Illness   78 year old female patient with a past medical history of Parkinson's disease on carbidopa and levodopa presenting to [facility] with septic shock secondary to UTI complicated by Klebsiella pneumonia bacteremia.     Clinical Impressions Pt was seen for OT evaluation this date. Prior to hospital admission, pt was living at home with 24/7 caregivers  Pt presents to acute OT demonstrating impaired ADL performance and functional mobility 2/2 weakness, low activity tolerance, and cognitive deficits (See OT problem list for additional functional deficits). Pt currently requires Max A and max verb cues for all bed mobility. Mod A to maintain seated balance at EOB with R posteriolateral lean noted during seated grooming tasks. Min A to complete grooming tasks. STS from EOB with it elevated with Mod A x2 with standing tolerance of ~5 mins x2 trials where she began having a BM. Lateral steps attempted at EOB with Max A x2 required including movement of RLE (per family in room this is the leg she has trouble with normally). Returned to supine with Max A and rolled to bil sides with Mod A for full linen change and peri-care with total assist. Increased time and cueing with low stimulus environment required for pt to process and sequence through tasks.  Pt would benefit from skilled OT services to address noted impairments and functional limitations (see below for any additional details) in order to maximize safety and independence while minimizing falls risk and caregiver burden. Do anticipate the need for follow up OT services upon acute hospital DC.      If plan is discharge home, recommend the following:   Two people to help with walking and/or transfers;A lot of help with bathing/dressing/bathroom     Functional Status Assessment   Patient has had a  recent decline in their functional status and demonstrates the ability to make significant improvements in function in a reasonable and predictable amount of time.     Equipment Recommendations   None recommended by OT     Recommendations for Other Services         Precautions/Restrictions   Precautions Precautions: Fall Restrictions Weight Bearing Restrictions Per Provider Order: No     Mobility Bed Mobility Overal bed mobility: Needs Assistance Bed Mobility: Supine to Sit, Sit to Supine Rolling: Mod assist, Used rails   Supine to sit: Max assist, HOB elevated, Used rails Sit to supine: Max assist   General bed mobility comments: attempts to help, but ultimately Max A    Transfers Overall transfer level: Needs assistance Equipment used: Rolling walker (2 wheels) Transfers: Sit to/from Stand Sit to Stand: From elevated surface, Mod assist, +2 physical assistance           General transfer comment: Mod A x2 with bed height elevated quite a bit for STS x2 trials during session; a few lateral steps taken with increased time, effort, and max cueing      Balance Overall balance assessment: Needs assistance Sitting-balance support: Bilateral upper extremity supported Sitting balance-Leahy Scale: Poor Sitting balance - Comments: briefly CGA, then progressed to Mod A with R lateral and posterior lean Postural control: Posterior lean, Right lateral lean Standing balance support: Reliant on assistive device for balance, Bilateral upper extremity supported Standing balance-Leahy Scale: Poor Standing balance comment: MOd A x2 for STS and Min A x1-2 to maintain balance in standing x5 mins for 2 trials  while attempting lateral steps                           ADL either performed or assessed with clinical judgement   ADL Overall ADL's : Needs assistance/impaired     Grooming: Wash/dry face;Oral care;Minimal assistance;Cueing for  sequencing;Sitting Grooming Details (indicate cue type and reason): seated EOB with Mod A to maintain seated balance d/t R lateral posterior lean                     Toileting- Clothing Manipulation and Hygiene: Total assistance;Bed level;Sit to/from stand Toileting - Clothing Manipulation Details (indicate cue type and reason): standing EOB began to have BM then finished cleaning at bed level             Vision         Perception         Praxis         Pertinent Vitals/Pain Pain Assessment Pain Assessment: Faces Faces Pain Scale: Hurts even more Pain Location: sore buttocks with peri-care Pain Descriptors / Indicators: Tender, Sore Pain Intervention(s): Monitored during session, Repositioned     Extremity/Trunk Assessment Upper Extremity Assessment Upper Extremity Assessment: Generalized weakness   Lower Extremity Assessment Lower Extremity Assessment: Generalized weakness       Communication Communication Communication: No apparent difficulties   Cognition Arousal: Alert Behavior During Therapy: Anxious Cognition: History of cognitive impairments             OT - Cognition Comments: Parkinson's-lewy body dementia; variable cognition day to day                 Following commands: Impaired Following commands impaired: Follows one step commands inconsistently, Follows one step commands with increased time     Cueing  General Comments   Cueing Techniques: Verbal cues;Tactile cues;Visual cues;Gestural cues      Exercises Other Exercises Other Exercises: Edu on role of OT in acute setting Other Exercises: Provided peri-care and full linen change post BM while standing at EOB.   Shoulder Instructions      Home Living Family/patient expects to be discharged to:: Private residence Living Arrangements: Non-relatives/Friends;Spouse/significant other Available Help at Discharge: Family;Personal care attendant;Available 24 hours/day    Home Access: Ramped entrance     Home Layout: One level     Bathroom Shower/Tub: Producer, television/film/video: Handicapped height Bathroom Accessibility: Yes   Home Equipment: Agricultural consultant (2 wheels);Rollator (4 wheels);BSC/3in1;Wheelchair - manual;Shower seat;Hospital bed;Hand held shower head;Grab bars - tub/shower;Shower seat - built in (forearm Parkinson's walker, waterproof walker for in shower)          Prior Functioning/Environment Prior Level of Function : Needs assist  Cognitive Assist : Mobility (cognitive);ADLs (cognitive) Mobility (Cognitive): Set up cues (apparently good and bad days, sometimes can hardly walk, sometimes can go 50-163ft in the home (aides always with her while up)) ADLs (Cognitive): Intermittent cues (has 24/7 assist, depending on good vs bad day needing minimal to nearly dependent assist) Physical Assist : Mobility (physical);ADLs (physical) Mobility (physical): Bed mobility;Transfers;Gait     ADLs Comments: bed level ADL performance with cueing and assist; varies day to day if she can ambulate to bathroom    OT Problem List: Decreased strength;Decreased activity tolerance;Impaired balance (sitting and/or standing)   OT Treatment/Interventions: Self-care/ADL training;Balance training;Therapeutic exercise;Therapeutic activities;Patient/family education      OT Goals(Current goals can be found in the care plan section)  Acute Rehab OT Goals Patient Stated Goal: progress mobility as able OT Goal Formulation: With family Time For Goal Achievement: 01/27/24 Potential to Achieve Goals: Fair ADL Goals Pt Will Perform Grooming: with min assist;sitting Pt Will Transfer to Toilet: with min assist;bedside commode;stand pivot transfer   OT Frequency:  Min 1X/week    Co-evaluation              AM-PAC OT "6 Clicks" Daily Activity     Outcome Measure Help from another person eating meals?: A Little Help from another person taking care  of personal grooming?: A Little Help from another person toileting, which includes using toliet, bedpan, or urinal?: Total Help from another person bathing (including washing, rinsing, drying)?: A Lot Help from another person to put on and taking off regular upper body clothing?: A Lot Help from another person to put on and taking off regular lower body clothing?: Total 6 Click Score: 12   End of Session Equipment Utilized During Treatment: Rolling walker (2 wheels);Gait belt;Oxygen Nurse Communication: Mobility status  Activity Tolerance: Patient tolerated treatment well Patient left: in bed;with call Depp/phone within reach;with bed alarm set;with nursing/sitter in room;with family/visitor present  OT Visit Diagnosis: Other abnormalities of gait and mobility (R26.89);Muscle weakness (generalized) (M62.81)                Time: 1610-9604 OT Time Calculation (min): 47 min Charges:  OT General Charges $OT Visit: 1 Visit OT Evaluation $OT Eval Moderate Complexity: 1 Mod OT Treatments $Self Care/Home Management : 23-37 mins Caidan Hubbert, OTR/L 01/13/24, 11:16 AM  Constance Goltz 01/13/2024, 10:58 AM

## 2024-01-13 NOTE — Plan of Care (Signed)

## 2024-01-13 NOTE — Progress Notes (Signed)
Progress Note   Patient: Deborah Velazquez BJY:[id number] DOB: Dec 10, 1945 DOA: 01/09/2024     3 DOS: the patient was seen and examined on 01/13/2024   Brief hospital course: 78 y.o female with significant PMH of Parkinson's disease, REM behavior disorder, excessive daytime sleepiness secondary to Parkinson's disease and carbidopa levodopa, dementia due to Parkinson's disease, peripheral neuropathy, anxiety and depression, seizures on Lamictal, RLS, TIA, HLD, and IBS who presented to the ED with chief complaints of altered mental status, hypotension, abdominal pain, diarrhea, generalized weakness and fever.    Assessment and Plan:   Septic shock secondary to UTI with Klebsiella pneumonia bacteremia. Patient required vasopressor support in the ICU Has been weaned off vasopressor support Urine culture growing Enterococcus faecalis Blood culture growing Klebsiella pneumoniae Blood cultures repeated Continue current antibiotics I have discussed outpatient antibiotic option with infectious disease team with recommendation as below:  "she has several options, augmentin or cefadroxil would be my preferences. she received ceftriaxone dose this am (day#4), so I feel can send out with 7 more days starting tomorrow to treat pyelonephritis. augmentin dose is 875mg  BID - this would be active against enterococcus in urine cx although that looks to be more of a contaminant. (if chose cefadroxil 1gm [500mg  x2] BID) "  Acute hypoxic respiratory failure secondary to suspected pneumonia On presentation patient required high flow nasal oxygenation which has now been weaned down to intranasal oxygen Currently requiring 2 L of intranasal oxygen, patient does not use oxygen at home Continue current antibiotics: Ceftriaxone Has completed a course of azithromycin Continue to monitor oxygen requirement closely    Parkinson disease complicated by dementia and RLS Continue Sinemet IR   Lesion under left breast  appears healing well This does not look infected Continue to monitor     Anxiety/depression,  Continue Seroquel   Seizures on Lamictal Continue Lamictal    Hyperlipidemia Continue statin therapy   Heme: heparin subcu for DVT prophylaxis     Subjective:  Patient seen this morning in the presence of the family She denies any acute overnight events Denies nausea vomiting abdominal pain or chest pain Leukocytosis improved today   Physical Exam:   GENERAL: Elderly female laying in bed on 2 L of intranasal oxygen EYES: PEERLA. No scleral icterus. Extraocular muscles intact.  NECK:  No JVD, supple  LUNGS: Decreased air entry bilaterally especially at the bases CARDIOVASCULAR: S1, S2 normal. No murmurs, rubs, or gallops.  ABDOMEN: Soft, nontender, healing wound found that the left breast EXTREMITIES: No swelling or erythema.   NEUROLOGIC: The patient is oriented to self. No focal neurological deficit  SKIN: No obvious rash, lesion, or ulcer. Warm to touch    Data reviewed:    Latest Ref Rng & Units 01/13/2024    5:46 AM 01/12/2024    5:54 AM 01/11/2024    5:56 AM  BMP  Glucose 70 - 99 mg/dL 086  99  88   BUN 8 - 23 mg/dL 6  9  15    Creatinine 0.44 - 1.00 mg/dL 5.78  4.69  6.29   Sodium 135 - 145 mmol/L 140  140  143   Potassium 3.5 - 5.1 mmol/L 3.3  3.3  3.5   Chloride 98 - 111 mmol/L 109  109  114   CO2 22 - 32 mmol/L 22  22  22    Calcium 8.9 - 10.3 mg/dL 8.9  8.6  8.8      Vitals:   01/13/24 0400 01/13/24 0759 01/13/24  1142 01/13/24 1214  BP: 121/68 113/64 101/63   Pulse: 72 75 70   Resp: 19 20 (!) 21   Temp: 98.9 F (37.2 C) 98.3 F (36.8 C) 98.7 F (37.1 C)   TempSrc: Oral Axillary Axillary   SpO2: 91% 98% 94% 95%  Weight:      Height:          Latest Ref Rng & Units 01/13/2024    5:46 AM 01/12/2024    5:54 AM 01/11/2024    5:56 AM  CBC  WBC 4.0 - 10.5 K/uL 13.3  21.9  21.7   Hemoglobin 12.0 - 15.0 g/dL 9.9  9.8  9.2   Hematocrit 36.0 - 46.0 % 30.1   30.0  28.3   Platelets 150 - 400 K/uL 187  171  155      Disposition: Patient's family wants home health but not rehab.  According to them patient has 24-hour care.  I anticipate patient will be ready for discharge in the next 1 to 2 days if respiratory function is better.  Author: Loyce Dys, MD 01/13/2024 12:58 PM  For on call review [email or web address].

## 2024-01-14 DIAGNOSIS — N390 Urinary tract infection, site not specified: Secondary | ICD-10-CM | POA: Diagnosis not present

## 2024-01-14 DIAGNOSIS — A419 Sepsis, unspecified organism: Secondary | ICD-10-CM | POA: Diagnosis not present

## 2024-01-14 LAB — BASIC METABOLIC PANEL
Anion gap: 8 (ref 5–15)
BUN: 7 mg/dL — ABNORMAL LOW (ref 8–23)
CO2: 23 mmol/L (ref 22–32)
Calcium: 9 mg/dL (ref 8.9–10.3)
Chloride: 110 mmol/L (ref 98–111)
Creatinine, Ser: 0.61 mg/dL (ref 0.44–1.00)
GFR, Estimated: >60 mL/min (ref >60)
Glucose, Bld: 90 mg/dL (ref 70–99)
Potassium: 3.9 mmol/L (ref 3.5–5.1)
Sodium: 141 mmol/L (ref 135–145)

## 2024-01-14 LAB — CBC WITH DIFFERENTIAL/PLATELET
Abs Immature Granulocytes: 0.23 10*3/uL — ABNORMAL HIGH (ref 0.00–0.07)
Basophils Absolute: 0.1 10*3/uL (ref 0.0–0.1)
Basophils Relative: 1 %
Eosinophils Absolute: 0.5 10*3/uL (ref 0.0–0.5)
Eosinophils Relative: 7 %
HCT: 30.8 % — ABNORMAL LOW (ref 36.0–46.0)
Hemoglobin: 10 g/dL — ABNORMAL LOW (ref 12.0–15.0)
Immature Granulocytes: 3 %
Lymphocytes Relative: 31 %
Lymphs Abs: 2.3 10*3/uL (ref 0.7–4.0)
MCH: 28.2 pg (ref 26.0–34.0)
MCHC: 32.5 g/dL (ref 30.0–36.0)
MCV: 86.8 fL (ref 80.0–100.0)
Monocytes Absolute: 0.7 10*3/uL (ref 0.1–1.0)
Monocytes Relative: 9 %
Neutro Abs: 3.6 10*3/uL (ref 1.7–7.7)
Neutrophils Relative %: 49 %
Platelets: 190 10*3/uL (ref 150–400)
RBC: 3.55 MIL/uL — ABNORMAL LOW (ref 3.87–5.11)
RDW: 15.9 % — ABNORMAL HIGH (ref 11.5–15.5)
WBC: 7.2 10*3/uL (ref 4.0–10.5)
nRBC: 0.3 % — ABNORMAL HIGH (ref 0.0–0.2)

## 2024-01-14 MED ORDER — RIVASTIGMINE TARTRATE 3 MG PO CAPS
3.0000 mg | ORAL_CAPSULE | Freq: Every day | ORAL | Status: DC
  Administered 2024-01-14: 3 mg via ORAL
  Filled 2024-01-14 (×2): qty 1

## 2024-01-14 NOTE — Progress Notes (Signed)
Physical Therapy Treatment Patient Details Name: Deborah Velazquez MRN: [id number] DOB: [birth year] Today's Date: 01/14/2024   History of Present Illness 78 year old female patient with a past medical history of Parkinson's disease on carbidopa and levodopa presenting to [facility] with septic shock secondary to UTI complicated by Klebsiella pneumonia bacteremia.    PT Comments  Pt very lethargic upon arrival. Caregiver at bedside said she previously was in a manic mode for last 32 hours without sleep. Then finally fell asleep and has been asleep thus far. Unable to arouse patient despite verbal/tactile stimulation. Noted BM and assisted in bed change x 2 and hygiene. Pt unresponsive during linen change. Pressure relief education given to caregiver and assisted with repositioning. Will continue to progress as able. Per caregiver, pt has really good days and able to perform mobility and then really bad days, however the bad days are becoming more frequent and it's harder to keep patient hydrated and awake to have PO intake.     If plan is discharge home, recommend the following: Two people to help with walking and/or transfers;Two people to help with bathing/dressing/bathroom;Assistance with cooking/housework;Assist for transportation   Can travel by private vehicle        Equipment Recommendations  None recommended by PT    Recommendations for Other Services       Precautions / Restrictions Precautions Precautions: Fall Restrictions Weight Bearing Restrictions Per Provider Order: No     Mobility  Bed Mobility Overal bed mobility: Needs Assistance Bed Mobility: Rolling Rolling: Total assist, +2 for physical assistance         General bed mobility comments: Due to lethargy, needed total assist +2 for rolling in addition to repositioning    Transfers                   General transfer comment: unable to tolerate    Ambulation/Gait                   Stairs              Wheelchair Mobility     Tilt Bed    Modified Rankin (Stroke Patients Only)       Balance                                            Communication    Cognition Arousal: Stuporous     PT - Cognitive impairments: History of cognitive impairments                       PT - Cognition Comments: pt unable to be aroused Following commands: Impaired      Cueing    Exercises Other Exercises Other Exercises: Pt with soiled bed. Assisted in rolling for bed change, hygiene and pillows placed for skin protection. Pt needed complete bed change x 2. New pure wick donned    General Comments        Pertinent Vitals/Pain Pain Assessment Breathing: normal Negative Vocalization: none Facial Expression: smiling or inexpressive Body Language: relaxed Consolability: no need to console PAINAD Score: 0    Home Living                          Prior Function            PT Goals (current goals  can now be found in the care plan section) Acute Rehab PT Goals Patient Stated Goal: go home PT Goal Formulation: With patient Time For Goal Achievement: 01/25/24 Potential to Achieve Goals: Fair Progress towards PT goals: Progressing toward goals    Frequency    Min 1X/week      PT Plan      Co-evaluation              AM-PAC PT "6 Clicks" Mobility   Outcome Measure  Help needed turning from your back to your side while in a flat bed without using bedrails?: Total Help needed moving from lying on your back to sitting on the side of a flat bed without using bedrails?: Total Help needed moving to and from a bed to a chair (including a wheelchair)?: Total Help needed standing up from a chair using your arms (e.g., wheelchair or bedside chair)?: Total Help needed to walk in hospital room?: Total Help needed climbing 3-5 steps with a railing? : Total 6 Click Score: 6    End of Session Equipment Utilized During Treatment:  Oxygen Activity Tolerance: Patient limited by lethargy Patient left: in bed;with bed alarm set;with family/visitor present Nurse Communication: Mobility status PT Visit Diagnosis: Muscle weakness (generalized) (M62.81);Difficulty in walking, not elsewhere classified (R26.2)     Time: 0454-0981 PT Time Calculation (min) (ACUTE ONLY): 29 min  Charges:    $Therapeutic Activity: 23-37 mins PT General Charges $$ ACUTE PT VISIT: 1 Visit                     Deborah Velazquez, PT, DPT, GCS [phone]    Deborah Velazquez 01/14/2024, 9:52 AM

## 2024-01-14 NOTE — Progress Notes (Signed)
   01/14/24 2016  Assess: MEWS Score  Temp 99.7 F (37.6 C)  Level of Consciousness Alert  Assess: MEWS Score  MEWS Temp 0  MEWS Systolic 0  MEWS Pulse 0  MEWS RR 0  MEWS LOC 0  MEWS Score 0  MEWS Score Color Green  Assess: SIRS CRITERIA  SIRS Temperature  0  SIRS Respirations  0  SIRS Pulse 0  SIRS WBC 0  SIRS Score Sum  0   Patient was alert. MEWS is now green

## 2024-01-14 NOTE — Plan of Care (Signed)
  Problem: Education: Goal: Knowledge of General Education information will improve Description: Including pain rating scale, medication(s)/side effects and non-pharmacologic comfort measures Outcome: Progressing   Problem: Health Behavior/Discharge Planning: Goal: Ability to manage health-related needs will improve Outcome: Progressing   Problem: Clinical Measurements: Goal: Diagnostic test results will improve Outcome: Progressing   Problem: Pain Managment: Goal: General experience of comfort will improve and/or be controlled Outcome: Progressing   Problem: Safety: Goal: Ability to remain free from injury will improve Outcome: Progressing

## 2024-01-14 NOTE — TOC Initial Note (Signed)
Transition of Care [facility]) - Initial/Assessment Note    Patient Details  Name: Deborah Velazquez MRN: [id number] Date of Birth: 08-19-1946  Transition of Care [facility]) CM/SW Contact:    Truddie Hidden, RN Phone Number: 01/14/2024, 11:18 AM  Clinical Narrative:                 Spoke with patient's Deborah Velazquez. regarding therapy's recommendation for [facility]. She is agreeable to [facility] and would like [facility] PT/OT  Patient has used them in the past . Deborah Velazquez stated patient will need EMS transport home and is aware she may be billed. Deborah Velazquez stated patient has around the clock care via family and [facility]. Deborah Velazquez was advised the accepting agency would contact her to schedule the [facility] within 48 hours of discharge.   HH referral for PT/OT made to Shaun from [facility].          Patient Goals and CMS Choice            Expected Discharge Plan and Services                                              Prior Living Arrangements/Services                       Activities of Daily Living   ADL Screening (condition at time of admission) Independently performs ADLs?: No Does the patient have a NEW difficulty with bathing/dressing/toileting/self-feeding that is expected to last >3 days?: No Does the patient have a NEW difficulty with getting in/out of bed, walking, or climbing stairs that is expected to last >3 days?: No Does the patient have a NEW difficulty with communication that is expected to last >3 days?: No Is the patient deaf or have difficulty hearing?: No Does the patient have difficulty seeing, even when wearing glasses/contacts?: No Does the patient have difficulty concentrating, remembering, or making decisions?: Yes  Permission Sought/Granted                  Emotional Assessment              Admission diagnosis:  Sepsis due to urinary tract infection (HCC) [A41.9, N39.0] Fever, unspecified fever cause [R50.9] Sepsis, due to unspecified  organism, unspecified whether acute organ dysfunction present [facility]) [A41.9] Patient Active Problem List   Diagnosis Date Noted   Sepsis due to urinary tract infection (HCC) 01/10/2024   Intra-abdominal and pelvic swelling, mass and lump, unspecified site 06/04/2023   Closed fracture of neck of left femur (HCC) 05/07/2023   Closed right hip fracture (HCC) 05/07/2023   Hypokalemia 03/20/2023   History of TIA (transient ischemic attack) 03/19/2023   Lewy body dementia (HCC) 03/17/2023   Aspiration pneumonia of right lower lobe (HCC) 03/17/2023   Overweight (BMI 25.0-29.9) 03/17/2023   Candidiasis of skin 01/06/2021   Lethargy 10/25/2020   Encounter for preventive health examination 07/01/2020   Proximal leg weakness 07/01/2020   Allergic rhinitis due to pollen 03/06/2019   Atrophic vaginitis 11/25/2018   Spondylolisthesis of lumbosacral region 10/07/2018   Preoperative clearance 09/13/2018   Neuropathy 07/13/2018   Cognitive impairment 09/22/2017   Parkinson's disease (HCC) 08/19/2017   Prediabetes 07/26/2016   Pure hypercholesterolemia 12/23/2014   Anxiety 06/24/2014   PCP:  Sherlene Shams, MD Pharmacy:   [facility] [phone] [address],  [place] - [address] AT [facility] [address] [facility] [address] [place] [address] Phone: [phone] Fax: [phone]     Social Drivers of Health (SDOH) Social History: SDOH Screenings   Food Insecurity: No Food Insecurity (01/10/2024)  Housing: Low Risk  (01/10/2024)  Transportation Needs: No Transportation Needs (01/10/2024)  Utilities: Not At Risk (01/10/2024)  Alcohol Screen: Low Risk  (11/11/2023)  Depression (PHQ2-9): High Risk (11/11/2023)  Financial Resource Strain: Low Risk  (11/11/2023)  Physical Activity: Inactive (11/11/2023)  Social Connections: Moderately Isolated (11/11/2023)  Stress: No Stress Concern Present (11/11/2023)  Tobacco Use: Low Risk  (01/09/2024)  Health Literacy: Inadequate  Health Literacy (11/11/2023)   SDOH Interventions:     Readmission Risk Interventions     No data to display

## 2024-01-14 NOTE — Progress Notes (Signed)
Progress Note   Patient: Deborah Velazquez ZOX:[id number] DOB: [birth year] DOA: 01/09/2024     4 DOS: the patient was seen and examined on 01/14/2024   Brief hospital course: "78 y.o female with significant PMH of Parkinson's disease, REM behavior disorder, excessive daytime sleepiness secondary to Parkinson's disease and carbidopa levodopa, dementia due to Parkinson's disease, peripheral neuropathy, anxiety and depression, seizures on Lamictal, RLS, TIA, HLD, and IBS who presented to the ED with chief complaints of altered mental status, hypotension, abdominal pain, diarrhea, generalized weakness and fever. "   Assessment and Plan:   Septic shock secondary to UTI with Klebsiella pneumonia bacteremia. Patient required vasopressor support in the ICU Has been weaned off vasopressor support Urine culture growing Enterococcus faecalis Blood culture growing Klebsiella pneumoniae Blood cultures repeated Continue current antibiotics Dr. Meriam Sprague discussed outpatient antibiotic options with ID team with recommendation as below:  "she has several options, augmentin or cefadroxil would be my preferences. she received ceftriaxone dose this am (day#4), so I feel can send out with 7 more days starting tomorrow to treat pyelonephritis. augmentin dose is 875mg  BID - this would be active against enterococcus in urine cx although that looks to be more of a contaminant. (if chose cefadroxil 1gm [500mg  x2] BID) "  Acute hypoxic respiratory failure secondary to suspected pneumonia On presentation patient required high flow nasal oxygenation which has now been weaned down to intranasal oxygen Currently requiring 2 L of intranasal oxygen, patient does not use oxygen at home Continue current antibiotics: Ceftriaxone Has completed a course of azithromycin Continue to monitor oxygen requirement closely --Overnight pulse oximetry study tonight    Parkinson disease complicated by dementia and RLS Continue Sinemet IR    Lesion under left breast appears healing well This does not look infected Continue to monitor     Anxiety/depression,  Continue Seroquel   Seizures on Lamictal Continue Lamictal    Hyperlipidemia Continue statin therapy   Heme: heparin subcu for DVT prophylaxis     Subjective:  Patient seen with daughter at bedside this AM.  Pt sleeping and daughter reports she was awake from very early Monday AM all the way through Tuesday and has since been sleeping.  She reports pt recently doing same at home and at times sleeps for days and barely wakes up to drink.     Physical Exam:   General exam: sleeping, responds to stimulus, no acute distress Respiratory system: CTAB, no wheezes, rales or rhonchi, normal respiratory effort. Cardiovascular system: normal S1/S2, RRR, no pedal edema.   Gastrointestinal system: soft, NT, ND, no HSM felt, +bowel sounds. Central nervous system: no gross focal neurologic deficits, exam limited by somnolence Extremities: no edema, normal tone Skin: dry, intact, normal temperature Psychiatry: exam limited by somnolence     Data reviewed:    Latest Ref Rng & Units 01/14/2024    6:01 AM 01/13/2024    5:46 AM 01/12/2024    5:54 AM  BMP  Glucose 70 - 99 mg/dL 90  811  99   BUN 8 - 23 mg/dL 7  6  9    Creatinine 0.44 - 1.00 mg/dL 9.14  7.82  9.56   Sodium 135 - 145 mmol/L 141  140  140   Potassium 3.5 - 5.1 mmol/L 3.9  3.3  3.3   Chloride 98 - 111 mmol/L 110  109  109   CO2 22 - 32 mmol/L 23  22  22    Calcium 8.9 - 10.3 mg/dL 9.0  8.9  8.6      Vitals:   01/14/24 0620 01/14/24 0800 01/14/24 1000 01/14/24 1200  BP:  139/85  135/68  Pulse:  73 61 72  Resp:  16 15 17   Temp:      TempSrc:      SpO2:  92% 92% 94%  Weight: 72.1 kg     Height:          Latest Ref Rng & Units 01/14/2024    6:01 AM 01/13/2024    5:46 AM 01/12/2024    5:54 AM  CBC  WBC 4.0 - 10.5 K/uL 7.2  13.3  21.9   Hemoglobin 12.0 - 15.0 g/dL 44.0  9.9  9.8   Hematocrit 36.0 -  46.0 % 30.8  30.1  30.0   Platelets 150 - 400 K/uL 190  187  171      Disposition: Home health -- likely tomorrow 2/20 pending overnight pulse oximetry study given inability to wean off oxygen  Author: Pennie Banter, DO 01/14/2024 3:48 PM  For on call review [email or web address].

## 2024-01-14 NOTE — Plan of Care (Signed)

## 2024-01-15 DIAGNOSIS — A419 Sepsis, unspecified organism: Secondary | ICD-10-CM | POA: Diagnosis not present

## 2024-01-15 DIAGNOSIS — N390 Urinary tract infection, site not specified: Secondary | ICD-10-CM | POA: Diagnosis not present

## 2024-01-15 MED ORDER — AMOXICILLIN-POT CLAVULANATE 875-125 MG PO TABS: 1.0000 | ORAL_TABLET | Freq: Two times a day (BID) | ORAL | 0 refills | Status: AC

## 2024-01-15 MED ORDER — ENSURE ENLIVE PO LIQD: 237.0000 mL | Freq: Two times a day (BID) | ORAL | Status: AC

## 2024-01-15 MED ORDER — ONDANSETRON 4 MG PO TBDP: 4.0000 mg | ORAL_TABLET | Freq: Three times a day (TID) | ORAL | 0 refills | Status: DC | PRN

## 2024-01-15 NOTE — Progress Notes (Signed)
OT Cancellation Note  Patient Details Name: ALDEAN SUDDETH MRN: [id number] DOB: 05-Dec-1945   Cancelled Treatment:    Reason Eval/Treat Not Completed: Fatigue/lethargy limiting ability to participate. Pt lethargic, unable to participate in skilled therapy at this time. OT will continue to follow and check back as able.  Marvalene Barrett L. Miana Politte, OTR/L  01/15/24, 9:59 AM

## 2024-01-15 NOTE — Discharge Summary (Signed)
 Physician Discharge Summary   Patient: Deborah Velazquez MRN: [id number] DOB: 06-07-1946  Admit date:     01/09/2024  Discharge date: 01/15/2024  Discharge Physician: Pennie Banter   PCP: Sherlene Shams, MD   Recommendations at discharge:   Follow up with Primary Care in 1-2 weeks Repeat CBC, BMP at follow up Pt qualified for nocturnal oxygen which was arranged for discharge  Discharge Diagnoses: Principal Problem:   Sepsis due to urinary tract infection (HCC)  Resolved Problems:   * No resolved hospital problems. *  Hospital Course:  "78 y.o female with significant PMH of Parkinson's disease, REM behavior disorder, excessive daytime sleepiness secondary to Parkinson's disease and carbidopa levodopa, dementia due to Parkinson's disease, peripheral neuropathy, anxiety and depression, seizures on Lamictal, RLS, TIA, HLD, and IBS who presented to the ED with chief complaints of altered mental status, hypotension, abdominal pain, diarrhea, generalized weakness and fever. "   Further hospital course and management as outlined below.  2/20 -- pt doing well this AM, no acute complaints. Daughter at bedside and reports pt stable, at her baseline.  Pt is medically stable for discharge with HH and oxygen during sleep.  Daughter agreeable with the plan.    Assessment and Plan:  Septic shock secondary to UTI with Klebsiella pneumonia bacteremia. Patient required vasopressor support in the ICU Has been weaned off vasopressor support Urine culture growing Enterococcus faecalis Blood culture growing Klebsiella pneumoniae Blood cultures repeated Treated with IV Rocephin --Discharge on PO Augmentin x 5 more days   Acute hypoxic respiratory failure secondary to suspected pneumonia On presentation patient required high flow nasal oxygenation which has now been weaned down to intranasal oxygen Currently requiring 2 L of intranasal oxygen, patient does not use oxygen at home Continue current  antibiotics: Ceftriaxone Has completed a course of azithromycin Continue to monitor oxygen requirement closely --Pt qualifies for oxygen during sleep - TOC has arranged.     Parkinson disease complicated by dementia and RLS Continue Sinemet IR   Lesion under left breast appears healing well This does not look infected Continue to monitor     Anxiety/depression,  Continue Seroquel   Seizures on Lamictal Continue Lamictal    Hyperlipidemia Continue statin therapy       Consultants: None Procedures performed: None  Disposition: Home health Diet recommendation:  Regular diet DISCHARGE MEDICATION: Allergies as of 01/15/2024       Reactions   Pseudoephedrine Hcl Anxiety, Other (See Comments)   Nervous   Sudafed [pseudoephedrine] Anxiety, Other (See Comments)   Nervous        Medication List     STOP taking these medications    estradiol 0.1 MG/GM vaginal cream Commonly known as: ESTRACE   OVER THE COUNTER MEDICATION       TAKE these medications    amoxicillin-clavulanate 875-125 MG tablet Commonly known as: AUGMENTIN Take 1 tablet by mouth 2 (two) times daily for 5 days.   aspirin EC 81 MG tablet Take 81 mg by mouth daily. Swallow whole.   atorvastatin 10 MG tablet Commonly known as: LIPITOR TAKE 1 TABLET(10 MG) BY MOUTH AT BEDTIME   carbidopa-levodopa 50-200 MG tablet Commonly known as: SINEMET CR Take 1 tablet by mouth 5 (five) times daily. Patient takes at 0700,1030,1400,1730,2200   DULoxetine 60 MG capsule Commonly known as: CYMBALTA Take 60 mg by mouth daily.   feeding supplement Liqd Take 237 mLs by mouth 2 (two) times daily between meals.   lamoTRIgine 100 MG  tablet Commonly known as: LAMICTAL Take 1 tablet by mouth 2 (two) times daily.   nystatin powder Commonly known as: MYCOSTATIN/NYSTOP Apply 1 application. topically 2 (two) times daily. To rash until resolved.   ondansetron 4 MG disintegrating tablet Commonly known as:  ZOFRAN-ODT Take 1 tablet (4 mg total) by mouth every 8 (eight) hours as needed for nausea or vomiting.   pregabalin 150 MG capsule Commonly known as: LYRICA Take 150 mg by mouth 2 (two) times daily.   QUEtiapine 25 MG tablet Commonly known as: SEROQUEL Take 0.5 tablets (12.5 mg total) by mouth at bedtime.   rivastigmine 1.5 MG capsule Commonly known as: EXELON Take 1.5 mg by mouth 2 (two) times daily.   sertraline 100 MG tablet Commonly known as: ZOLOFT Take 200 mg by mouth daily.        Discharge Exam: Filed Weights   01/12/24 0500 01/14/24 0620 01/15/24 0504  Weight: 76 kg 72.1 kg 69.8 kg   General exam: awake, alert, no acute distress HEENT: moist mucus membranes, hearing grossly normal  Respiratory system: CTAB, no wheezes, rales or rhonchi, normal respiratory effort. Cardiovascular system: normal S1/S2,  RRR Gastrointestinal system: soft, NT, ND Central nervous system: exam limited due to dementia, no gross focal neurologic deficits, nonverbal Skin: dry, intact, normal temperature Psychiatry: normal mood, congruent affect, judgement and insight appear normal   Condition at discharge: stable  The results of significant diagnostics from this hospitalization (including imaging, microbiology, ancillary and laboratory) are listed below for reference.   Imaging Studies: CT ABDOMEN PELVIS W CONTRAST Result Date: 01/09/2024 CLINICAL DATA:  Abdominal pain, acute, nonlocalized. Pt has FLU like symptoms and fever at home of 101. EXAM: CT ABDOMEN AND PELVIS WITH CONTRAST TECHNIQUE: Multidetector CT imaging of the abdomen and pelvis was performed using the standard protocol following bolus administration of intravenous contrast. RADIATION DOSE REDUCTION: This exam was performed according to the departmental dose-optimization program which includes automated exposure control, adjustment of the mA and/or kV according to patient size and/or use of iterative reconstruction technique.  CONTRAST:  75mL OMNIPAQUE IOHEXOL 300 MG/ML  SOLN COMPARISON:  CT abdomen pelvis 03/17/2023 FINDINGS: Lower chest: Bilateral lower lobe atelectasis. No acute abnormality. Hepatobiliary: No focal liver abnormality. No gallstones, gallbladder wall thickening, or pericholecystic fluid. No biliary dilatation. Pancreas: No focal lesion. Pancreatic tail peripancreatic fat stranding likely related to adjacent left renal infection. No main pancreatic ductal dilatation. Spleen:  Normal in size without focal abnormality. Adrenals/Urinary Tract: No adrenal nodule bilaterally. Bilateral kidneys enhance symmetrically. Left urothelial thickening and perinephric/peripelvic fat stranding. No hydronephrosis. No hydroureter. No nephroureterolithiasis. Punctate calcification along the distal left ureter likely associated with the adjacent vasculature. The urinary bladder is unremarkable. Stomach/Bowel: Stomach is within normal limits. No evidence of bowel wall thickening or dilatation. Appendix appears normal. Vascular/Lymphatic: No abdominal aorta or iliac aneurysm. Moderate atherosclerotic plaque of the aorta and its branches. No abdominal, pelvic, or inguinal lymphadenopathy. Reproductive: Uterus and bilateral adnexa are unremarkable. Other: No intraperitoneal free fluid. No intraperitoneal free gas. No organized fluid collection. Musculoskeletal: No abdominal wall hernia or abnormality. No suspicious lytic or blastic osseous lesions. No acute displaced fracture. Multilevel degenerative changes of the spine intervertebral disc space vacuum phenomenon at the L3-L4 level. Grade 1 anterolisthesis of L4 on L5. L4-L5 posterolateral and interbody surgical hardware. Total left hip arthroplasty. IMPRESSION: 1. Left urothelial thickening and perinephric/peripelvic fat stranding. Correlate with urinalysis for infection. 2. Pancreatic tail peripancreatic fat stranding. Pancreatic tail peripancreatic fat stranding likely related to  adjacent  left renal infection. Correlate with lipase levels to exclude for possible superimpose acute pancreatitis. Electronically Signed   By: Tish Frederickson M.D.   On: 01/09/2024 23:17   DG Chest 1 View Result Date: 01/09/2024 CLINICAL DATA:  Fever EXAM: CHEST  1 VIEW COMPARISON:  05/07/2023 FINDINGS: Patient is rotated. Stable cardiomediastinal contours. Slightly low lung volumes with elevation of the right hemidiaphragm. Increased interstitial markings bilaterally. No pleural effusion or pneumothorax. Skin fold overlies the upper left chest. IMPRESSION: Increased interstitial markings bilaterally, which may reflect edema or atypical/viral infection. Electronically Signed   By: Duanne Guess D.O.   On: 01/09/2024 21:42    Microbiology: Results for orders placed or performed during the hospital encounter of 01/09/24  Resp panel by RT-PCR (RSV, Flu A&B, Covid) Anterior Nasal Swab     Status: None   Collection Time: 01/09/24  7:52 PM   Specimen: Anterior Nasal Swab  Result Value Ref Range Status   SARS Coronavirus 2 by RT PCR NEGATIVE NEGATIVE Final    Comment: (NOTE) SARS-CoV-2 target nucleic acids are NOT DETECTED.  The SARS-CoV-2 RNA is generally detectable in upper respiratory specimens during the acute phase of infection. The lowest concentration of SARS-CoV-2 viral copies this assay can detect is 138 copies/mL. A negative result does not preclude SARS-Cov-2 infection and should not be used as the sole basis for treatment or other patient management decisions. A negative result may occur with  improper specimen collection/handling, submission of specimen other than nasopharyngeal swab, presence of viral mutation(s) within the areas targeted by this assay, and inadequate number of viral copies(<138 copies/mL). A negative result must be combined with clinical observations, patient history, and epidemiological information. The expected result is Negative.  Fact Sheet for Patients:   BloggerCourse.com  Fact Sheet for Healthcare Providers:  SeriousBroker.it  This test is no t yet approved or cleared by the [place] FDA and  has been authorized for detection and/or diagnosis of SARS-CoV-2 by FDA under an Emergency Use Authorization (EUA). This EUA will remain  in effect (meaning this test can be used) for the duration of the COVID-19 declaration under Section 564(b)(1) of the Act, 21 U.S.C.section 360bbb-3(b)(1), unless the authorization is terminated  or revoked sooner.       Influenza A by PCR NEGATIVE NEGATIVE Final   Influenza B by PCR NEGATIVE NEGATIVE Final    Comment: (NOTE) The Xpert Xpress SARS-CoV-2/FLU/RSV plus assay is intended as an aid in the diagnosis of influenza from Nasopharyngeal swab specimens and should not be used as a sole basis for treatment. Nasal washings and aspirates are unacceptable for Xpert Xpress SARS-CoV-2/FLU/RSV testing.  Fact Sheet for Patients: BloggerCourse.com  Fact Sheet for Healthcare Providers: SeriousBroker.it  This test is not yet approved or cleared by the [place] FDA and has been authorized for detection and/or diagnosis of SARS-CoV-2 by FDA under an Emergency Use Authorization (EUA). This EUA will remain in effect (meaning this test can be used) for the duration of the COVID-19 declaration under Section 564(b)(1) of the Act, 21 U.S.C. section 360bbb-3(b)(1), unless the authorization is terminated or revoked.     Resp Syncytial Virus by PCR NEGATIVE NEGATIVE Final    Comment: (NOTE) Fact Sheet for Patients: BloggerCourse.com  Fact Sheet for Healthcare Providers: SeriousBroker.it  This test is not yet approved or cleared by the [place] FDA and has been authorized for detection and/or diagnosis of SARS-CoV-2 by FDA under an Emergency Use  Authorization (EUA). This EUA will remain  in effect (meaning this test can be used) for the duration of the COVID-19 declaration under Section 564(b)(1) of the Act, 21 U.S.C. section 360bbb-3(b)(1), unless the authorization is terminated or revoked.  Performed at [facility], [address] [place], [place] [address]   Culture, blood (routine x 2)     Status: Abnormal   Collection Time: 01/09/24  7:52 PM   Specimen: Left Antecubital; Blood  Result Value Ref Range Status   Specimen Description   Final    LEFT ANTECUBITAL Performed at [facility], [address], [place], [place] [address]    Special Requests   Final    BOTTLES DRAWN AEROBIC AND ANAEROBIC Blood Culture adequate volume Performed at [facility], [address] [place], [place] [address]    Culture  Setup Time   Final    GRAM NEGATIVE RODS IN BOTH AEROBIC AND ANAEROBIC BOTTLES CRITICAL RESULT CALLED TO, READ BACK BY AND VERIFIED WITH: SHEEMA HALLAJI ON 01/10/24 AT 0347 QSD Performed at [facility], [address], [place], [place] [address]    Culture KLEBSIELLA PNEUMONIAE (A)  Final   Report Status 01/12/2024 FINAL  Final   Organism ID, Bacteria KLEBSIELLA PNEUMONIAE  Final   Organism ID, Bacteria KLEBSIELLA PNEUMONIAE  Final      Susceptibility   Klebsiella pneumoniae - KIRBY BAUER*    CEFAZOLIN SENSITIVE Sensitive    Klebsiella pneumoniae - MIC*    AMPICILLIN RESISTANT Resistant     CEFEPIME <=0.12 SENSITIVE Sensitive     CEFTAZIDIME <=1 SENSITIVE Sensitive     CEFTRIAXONE <=0.25 SENSITIVE Sensitive     CIPROFLOXACIN <=0.25 SENSITIVE Sensitive     GENTAMICIN <=1 SENSITIVE Sensitive     IMIPENEM <=0.25 SENSITIVE Sensitive     TRIMETH/SULFA <=20 SENSITIVE Sensitive     AMPICILLIN/SULBACTAM <=2 SENSITIVE Sensitive     PIP/TAZO <=4 SENSITIVE Sensitive ug/mL    * KLEBSIELLA PNEUMONIAE    KLEBSIELLA PNEUMONIAE  Blood Culture ID Panel (Reflexed)     Status: Abnormal    Collection Time: 01/09/24  7:52 PM  Result Value Ref Range Status   Enterococcus faecalis NOT DETECTED NOT DETECTED Final   Enterococcus Faecium NOT DETECTED NOT DETECTED Final   Listeria monocytogenes NOT DETECTED NOT DETECTED Final   Staphylococcus species NOT DETECTED NOT DETECTED Final   Staphylococcus aureus (BCID) NOT DETECTED NOT DETECTED Final   Staphylococcus epidermidis NOT DETECTED NOT DETECTED Final   Staphylococcus lugdunensis NOT DETECTED NOT DETECTED Final   Streptococcus species NOT DETECTED NOT DETECTED Final   Streptococcus agalactiae NOT DETECTED NOT DETECTED Final   Streptococcus pneumoniae NOT DETECTED NOT DETECTED Final   Streptococcus pyogenes NOT DETECTED NOT DETECTED Final   A.calcoaceticus-baumannii NOT DETECTED NOT DETECTED Final   Bacteroides fragilis NOT DETECTED NOT DETECTED Final   Enterobacterales DETECTED (A) NOT DETECTED Final    Comment: Enterobacterales represent a large order of gram negative bacteria, not a single organism. CRITICAL RESULT CALLED TO, READ BACK BY AND VERIFIED WITH: SHEEMA HALLAJI ON 01/10/24 AT 0738 QSD    Enterobacter cloacae complex NOT DETECTED NOT DETECTED Final   Escherichia coli NOT DETECTED NOT DETECTED Final   Klebsiella aerogenes NOT DETECTED NOT DETECTED Final   Klebsiella oxytoca NOT DETECTED NOT DETECTED Final   Klebsiella pneumoniae DETECTED (A) NOT DETECTED Final    Comment: CRITICAL RESULT CALLED TO, READ BACK BY AND VERIFIED WITH: SHEEMA HALLAJI ON 01/10/24 AT 0738 QSD    Proteus species NOT DETECTED NOT DETECTED  Final   Salmonella species NOT DETECTED NOT DETECTED Final   Serratia marcescens NOT DETECTED NOT DETECTED Final   Haemophilus influenzae NOT DETECTED NOT DETECTED Final   Neisseria meningitidis NOT DETECTED NOT DETECTED Final   Pseudomonas aeruginosa NOT DETECTED NOT DETECTED Final   Stenotrophomonas maltophilia NOT DETECTED NOT DETECTED Final   Candida albicans NOT DETECTED NOT DETECTED Final   Candida  auris NOT DETECTED NOT DETECTED Final   Candida glabrata NOT DETECTED NOT DETECTED Final   Candida krusei NOT DETECTED NOT DETECTED Final   Candida parapsilosis NOT DETECTED NOT DETECTED Final   Candida tropicalis NOT DETECTED NOT DETECTED Final   Cryptococcus neoformans/gattii NOT DETECTED NOT DETECTED Final   CTX-M ESBL NOT DETECTED NOT DETECTED Final   Carbapenem resistance IMP NOT DETECTED NOT DETECTED Final   Carbapenem resistance KPC NOT DETECTED NOT DETECTED Final   Carbapenem resistance NDM NOT DETECTED NOT DETECTED Final   Carbapenem resist OXA 48 LIKE NOT DETECTED NOT DETECTED Final   Carbapenem resistance VIM NOT DETECTED NOT DETECTED Final    Comment: Performed at [facility], [address] [place], [place] [address]  Culture, blood (routine x 2)     Status: Abnormal   Collection Time: 01/09/24  7:56 PM   Specimen: Right Antecubital; Blood  Result Value Ref Range Status   Specimen Description   Final    RIGHT ANTECUBITAL Performed at [facility], [address] [place], [place] [address]    Special Requests   Final    BOTTLES DRAWN AEROBIC AND ANAEROBIC Blood Culture results may not be optimal due to an inadequate volume of blood received in culture bottles Performed at [facility], [address] [place], [place] [address]    Culture  Setup Time   Final    GRAM NEGATIVE RODS IN BOTH AEROBIC AND ANAEROBIC BOTTLES CRITICAL RESULT CALLED TO, READ BACK BY AND VERIFIED WITH: SHEEMA HALLAJI ON 01/10/24 AT 0738 QSD    Culture (A)  Final    KLEBSIELLA PNEUMONIAE SUSCEPTIBILITIES PERFORMED ON PREVIOUS CULTURE WITHIN THE LAST 5 DAYS. Performed at [facility], [address] [place], [place] [address]    Report Status 01/12/2024 FINAL  Final  Urine Culture     Status: Abnormal   Collection Time: 01/10/24  2:51 AM   Specimen: Urine, Random  Result Value Ref Range Status   Specimen Description   Final    URINE, RANDOM Performed at  [facility], [address] [place], [place] [address]    Special Requests   Final    NONE Reflexed from [id number] Performed at [facility], [address] [place], [place] [address]    Culture 20,000 COLONIES/mL ENTEROCOCCUS FAECALIS (A)  Final   Report Status 01/12/2024 FINAL  Final   Organism ID, Bacteria ENTEROCOCCUS FAECALIS (A)  Final      Susceptibility   Enterococcus faecalis - MIC*    AMPICILLIN <=2 SENSITIVE Sensitive     NITROFURANTOIN <=16 SENSITIVE Sensitive     VANCOMYCIN 1 SENSITIVE Sensitive     * 20,000 COLONIES/mL ENTEROCOCCUS FAECALIS  MRSA Next Gen by PCR, Nasal     Status: None   Collection Time: 01/10/24  4:07 AM   Specimen: Nasal Mucosa; Nasal Swab  Result Value Ref Range Status   MRSA by PCR Next Gen NOT DETECTED NOT DETECTED Final    Comment: (NOTE) The GeneXpert MRSA Assay (FDA approved for NASAL specimens only), is one component of a  comprehensive MRSA colonization surveillance program. It is not intended to diagnose MRSA infection nor to guide or monitor treatment for MRSA infections. Test performance is not FDA approved in patients less than 21 years old. Performed at [facility], [address] [place], [place] [address]   Aerobic/Anaerobic Culture w Gram Stain (surgical/deep wound)     Status: None   Collection Time: 01/10/24 12:04 PM   Specimen: Abdomen; Wound  Result Value Ref Range Status   Specimen Description   Final    ABDOMEN Performed at [facility], [address], [place], [place] [address]    Special Requests   Final    NONE Performed at [facility], [address] [place], [place] [address]    Gram Stain   Final    WBC PRESENT,BOTH PMN AND MONONUCLEAR ABUNDANT GRAM POSITIVE COCCI    Culture   Final    RARE FINEGOLDIA MAGNA Standardized susceptibility testing for this organism is not available. Performed at [facility], [address], [place], [place] [address]     Report Status 01/16/2024 FINAL  Final  Culture, blood (Routine X 2) w Reflex to ID Panel     Status: None   Collection Time: 01/12/24  6:50 PM   Specimen: BLOOD  Result Value Ref Range Status   Specimen Description BLOOD RIGHT ANTECUBITAL  Final   Special Requests   Final    BOTTLES DRAWN AEROBIC AND ANAEROBIC Blood Culture adequate volume   Culture   Final    NO GROWTH 5 DAYS Performed at [facility], [address] [place], [place] [address]    Report Status 01/17/2024 FINAL  Final  Culture, blood (Routine X 2) w Reflex to ID Panel     Status: None   Collection Time: 01/12/24  7:01 PM   Specimen: BLOOD  Result Value Ref Range Status   Specimen Description BLOOD BLOOD RIGHT HAND  Final   Special Requests   Final    BOTTLES DRAWN AEROBIC AND ANAEROBIC Blood Culture adequate volume   Culture   Final    NO GROWTH 5 DAYS Performed at [facility], [address] [place], [place] [address]    Report Status 01/17/2024 FINAL  Final    Labs: CBC: Recent Labs  Lab 01/11/24 0556 01/12/24 0554 01/13/24 0546 01/14/24 0601  WBC 21.7* 21.9* 13.3* 7.2  NEUTROABS  --   --  10.6* 3.6  HGB 9.2* 9.8* 9.9* 10.0*  HCT 28.3* 30.0* 30.1* 30.8*  MCV 87.6 86.7 84.8 86.8  PLT 155 171 187 190   Basic Metabolic Panel: Recent Labs  Lab 01/10/24 1936 01/11/24 0556 01/12/24 0554 01/13/24 0546 01/14/24 0601  NA 144 143 140 140 141  K 3.6 3.5 3.3* 3.3* 3.9  CL 109 114* 109 109 110  CO2 21* 22 22 22 23   GLUCOSE 116* 88 99 102* 90  BUN 15 15 9  6* 7*  CREATININE 0.67 0.67 0.60 0.55 0.61  CALCIUM 8.6* 8.8* 8.6* 8.9 9.0  MG 2.4 2.1 1.8  --   --   PHOS 2.8 2.5 2.4*  --   --    Liver Function Tests: No results for input(s): "AST", "ALT", "ALKPHOS", "BILITOT", "PROT", "ALBUMIN" in the last 168 hours.  CBG: No results for input(s): "GLUCAP" in the last 168 hours.   Discharge time spent: greater than 30 minutes.  Signed: Pennie Banter, DO [facility] 01/17/2024

## 2024-01-15 NOTE — Plan of Care (Signed)

## 2024-01-15 NOTE — TOC Progression Note (Signed)
Transition of Care [facility]) - Progression Note    Patient Details  Name: Deborah Velazquez MRN: [id number] Date of Birth: 04-06-1946  Transition of Care [facility]) CM/SW Contact  Truddie Hidden, RN Phone Number: 01/15/2024, 12:34 PM  Clinical Narrative:    Confirmed with Shaun from [facility] patient was accepted for [facility].  Request for nocturnal oxygen sent to [facility] from [facility].            Expected Discharge Plan and Services         Expected Discharge Date: 01/15/24                                     Social Determinants of Health (SDOH) Interventions SDOH Screenings   Food Insecurity: No Food Insecurity (01/10/2024)  Housing: Low Risk  (01/10/2024)  Transportation Needs: No Transportation Needs (01/10/2024)  Utilities: Not At Risk (01/10/2024)  Alcohol Screen: Low Risk  (11/11/2023)  Depression (PHQ2-9): High Risk (11/11/2023)  Financial Resource Strain: Low Risk  (11/11/2023)  Physical Activity: Inactive (11/11/2023)  Social Connections: Moderately Isolated (11/11/2023)  Stress: No Stress Concern Present (11/11/2023)  Tobacco Use: Low Risk  (01/09/2024)  Health Literacy: Inadequate Health Literacy (11/11/2023)    Readmission Risk Interventions     No data to display

## 2024-01-15 NOTE — Plan of Care (Signed)
  Problem: Clinical Measurements: Goal: Diagnostic test results will improve Outcome: Not Progressing Goal: Respiratory complications will improve Outcome: Not Progressing   Problem: Activity: Goal: Risk for activity intolerance will decrease Outcome: Not Progressing

## 2024-01-15 NOTE — TOC Transition Note (Addendum)
Transition of Care [facility]) - Discharge Note   Patient Details  Name: Deborah Velazquez MRN: [id number] Date of Birth: 1946/07/27  Transition of Care [facility]) CM/SW Contact:  Deborah Hidden, RN Phone Number: 01/15/2024, 2:16 PM   Clinical Narrative:    Patient discharging home.  HH arranged via [facility] Spoke with Deborah Velazquez, patient's HCPOA to advised of discharge today. Deborah Velazquez's daughter is at patient's bedside and is aware of discharge as well. Deborah Velazquez advised [facility] will contact her to scheduled SOC.  EMS arranged for home address listed.  Face sheet and medical necessity form printed to floor for to be added to EMS packet Per EMS" She's sixth on the list."  Nurse notified of discharge.    2:32pm Per Mitch from [employer], ONO needed to process nocturnal insurance. Results not located on chart. RN to call RT.   3:50pm ONO sent via email to [facility] from [employer]. Message sent to notify ONO was sent via email.             Patient Goals and CMS Choice            Discharge Placement                       Discharge Plan and Services Additional resources added to the After Visit Summary for                                       Social Drivers of Health (SDOH) Interventions SDOH Screenings   Food Insecurity: No Food Insecurity (01/10/2024)  Housing: Low Risk  (01/10/2024)  Transportation Needs: No Transportation Needs (01/10/2024)  Utilities: Not At Risk (01/10/2024)  Alcohol Screen: Low Risk  (11/11/2023)  Depression (PHQ2-9): High Risk (11/11/2023)  Financial Resource Strain: Low Risk  (11/11/2023)  Physical Activity: Inactive (11/11/2023)  Social Connections: Moderately Isolated (11/11/2023)  Stress: No Stress Concern Present (11/11/2023)  Tobacco Use: Low Risk  (01/09/2024)  Health Literacy: Inadequate Health Literacy (11/11/2023)     Readmission Risk Interventions     No data to display

## 2024-01-15 NOTE — Progress Notes (Signed)
Physical Therapy Treatment Patient Details Name: Deborah Velazquez MRN: [id number] DOB: [birth year] Today's Date: 01/15/2024   History of Present Illness 78 year old female patient with a past medical history of Parkinson's disease on carbidopa and levodopa presenting to [facility] with septic shock secondary to UTI complicated by Klebsiella pneumonia bacteremia.    PT Comments  Pt is making good progress towards goals with ability to participate minimally. PT/OT co-treat performed will ability to sit at EOB, stand x 1 trial, and required complete bed change. Caregiver at bedside very involved and helpful. Will continue to progress.    If plan is discharge home, recommend the following: Two people to help with walking and/or transfers;Two people to help with bathing/dressing/bathroom;Assistance with cooking/housework;Assist for transportation   Can travel by private vehicle        Equipment Recommendations  None recommended by PT    Recommendations for Other Services       Precautions / Restrictions Precautions Precautions: Fall Restrictions Weight Bearing Restrictions Per Provider Order: No     Mobility  Bed Mobility Overal bed mobility: Needs Assistance Bed Mobility: Supine to Sit, Sit to Supine     Supine to sit: Max assist, Total assist, +2 for physical assistance Sit to supine: +2 for physical assistance, Max assist   General bed mobility comments: pt with poor ability to initiate movement. Once seated, heavy R side lateral lean. With increased time, improved seated balance progressing to supervision. Pt needed total assist +2 for return back supine    Transfers Overall transfer level: Needs assistance Equipment used: None Transfers: Sit to/from Stand Sit to Stand: Total assist           General transfer comment: only able to stand for approx 30 seconds prior to extreme fatigues    Ambulation/Gait               General Gait Details: unable   Stairs              Wheelchair Mobility     Tilt Bed    Modified Rankin (Stroke Patients Only)       Balance Overall balance assessment: Needs assistance Sitting-balance support: Bilateral upper extremity supported Sitting balance-Leahy Scale: Fair     Standing balance support: Bilateral upper extremity supported Standing balance-Leahy Scale: Zero                              Communication Communication Communication: No apparent difficulties  Cognition Arousal: Lethargic Behavior During Therapy: Flat affect, Lability   PT - Cognitive impairments: History of cognitive impairments                       PT - Cognition Comments: initially alert, however quickly fatigues and becomes more lethargic Following commands: Impaired Following commands impaired: Follows one step commands inconsistently, Follows one step commands with increased time    Cueing Cueing Techniques: Verbal cues, Tactile cues, Visual cues, Gestural cues  Exercises Other Exercises Other Exercises: provided full linen change with OT while PT assisted in standing attempt. Total assist required Other Exercises: seated weight shifting in all directions with max assist. x 5 reps. Also performed hair brushing while seated at bedside    General Comments        Pertinent Vitals/Pain Pain Assessment Pain Assessment: Faces Faces Pain Scale: Hurts a little bit Pain Location: sore buttocks with pericare Pain Descriptors / Indicators: Tender, Sore Pain Intervention(s): Limited  activity within patient's tolerance, Repositioned    Home Living                          Prior Function            PT Goals (current goals can now be found in the care plan section) Acute Rehab PT Goals Patient Stated Goal: go home PT Goal Formulation: With patient Time For Goal Achievement: 01/25/24 Potential to Achieve Goals: Fair Progress towards PT goals: Progressing toward goals    Frequency     Min 1X/week      PT Plan      Co-evaluation PT/OT/SLP Co-Evaluation/Treatment: Yes Reason for Co-Treatment: Complexity of the patient's impairments (multi-system involvement);For patient/therapist safety;To address functional/ADL transfers PT goals addressed during session: Mobility/safety with mobility OT goals addressed during session: ADL's and self-care      AM-PAC PT "6 Clicks" Mobility   Outcome Measure  Help needed turning from your back to your side while in a flat bed without using bedrails?: Total Help needed moving from lying on your back to sitting on the side of a flat bed without using bedrails?: Total Help needed moving to and from a bed to a chair (including a wheelchair)?: Total Help needed standing up from a chair using your arms (e.g., wheelchair or bedside chair)?: Total Help needed to walk in hospital room?: Total Help needed climbing 3-5 steps with a railing? : Total 6 Click Score: 6    End of Session Equipment Utilized During Treatment: Gait belt Activity Tolerance: Patient limited by lethargy Patient left: in bed;with bed alarm set;with family/visitor present Nurse Communication: Mobility status PT Visit Diagnosis: Muscle weakness (generalized) (M62.81);Difficulty in walking, not elsewhere classified (R26.2)     Time: 1130-1157 PT Time Calculation (min) (ACUTE ONLY): 27 min  Charges:    $Therapeutic Activity: 8-22 mins PT General Charges $$ ACUTE PT VISIT: 1 Visit                     Elizabeth Palau, PT, DPT, GCS [phone]    Deborah Velazquez 01/15/2024, 1:19 PM

## 2024-01-15 NOTE — Progress Notes (Signed)
PT Cancellation Note  Patient Details Name: Deborah Velazquez MRN: [id number] DOB: Apr 03, 1946   Cancelled Treatment:    Reason Eval/Treat Not Completed: Other (comment). Pt remains lethargic, unable to participate in therapy at this time. Per RN, she will contact therapy team if she becomes more appropriate.   [name] 01/15/2024, 9:57 AM Elizabeth Palau, PT, DPT, GCS [phone]

## 2024-01-15 NOTE — Progress Notes (Signed)
Occupational Therapy Treatment Patient Details Name: Deborah Velazquez MRN: [id number] DOB: [birth year] Today's Date: 01/15/2024   History of present illness 78 year old female patient with a past medical history of Parkinson's disease on carbidopa and levodopa presenting to [facility] with septic shock secondary to UTI complicated by Klebsiella pneumonia bacteremia.   OT comments  Pt seen with PT to maximize therapeutic outcomes. Supportive caregiver present throughout. MAX-TOTAL +2 for bed mobility supine > sit EOB. Grooming tasks seated, requires maxA to correct R lateral/posterior lean. Found to have soiled linens, pt able to stand with maxA and requires TOTAL A for pericare in standing for bed change. Pt near functional baseline, hopeful to discharge today. OT will continue to follow, discharge recommendation remains appropriate.       If plan is discharge home, recommend the following:  Two people to help with walking and/or transfers;A lot of help with bathing/dressing/bathroom   Equipment Recommendations  None recommended by OT       Precautions / Restrictions Precautions Precautions: Fall Restrictions Weight Bearing Restrictions Per Provider Order: No       Mobility Bed Mobility Overal bed mobility: Needs Assistance Bed Mobility: Supine to Sit, Sit to Supine     Supine to sit: Max assist, Total assist, +2 for physical assistance Sit to supine: +2 for physical assistance, Max assist        Transfers Overall transfer level: Needs assistance Equipment used: None Transfers: Sit to/from Stand Sit to Stand: Max assist                 Balance Overall balance assessment: Needs assistance Sitting-balance support: Bilateral upper extremity supported Sitting balance-Leahy Scale: Zero Sitting balance - Comments: required mod-maxA for sitting balance, able to sit with CGA ~10 sec after brief standing bout Postural control: Posterior lean, Right lateral lean   Standing  balance-Leahy Scale: Zero                             ADL either performed or assessed with clinical judgement   ADL Overall ADL's : Needs assistance/impaired     Grooming: Brushing hair;Sitting;Set up Grooming Details (indicate cue type and reason): heavy R lean/posterior lean seated EOB, mod-maxA                     Toileting- Clothing Manipulation and Hygiene: Total assistance;Sit to/from stand Toileting - Clothing Manipulation Details (indicate cue type and reason): totalA for pericare during brief standing bout             Communication Communication Communication: No apparent difficulties   Cognition Arousal: Lethargic Behavior During Therapy: Flat affect, Lability Cognition: History of cognitive impairments             OT - Cognition Comments: Parkinson's-lewy body dementia; variable cognition day to day                 Following commands: Impaired Following commands impaired: Follows one step commands inconsistently, Follows one step commands with increased time      Cueing   Cueing Techniques: Verbal cues, Tactile cues, Visual cues, Gestural cues             Pertinent Vitals/ Pain       Pain Assessment Pain Assessment: No/denies pain         Frequency  Min 1X/week        Progress Toward Goals  OT Goals(current goals can now be found in  the care plan section)  Progress towards OT goals: Progressing toward goals  Acute Rehab OT Goals OT Goal Formulation: With family Time For Goal Achievement: 01/27/24 Potential to Achieve Goals: Fair ADL Goals Pt Will Perform Grooming: with min assist;sitting Pt Will Transfer to Toilet: with min assist;bedside commode;stand pivot transfer  Plan         AM-PAC OT "6 Clicks" Daily Activity     Outcome Measure   Help from another person eating meals?: A Little Help from another person taking care of personal grooming?: A Little Help from another person toileting, which  includes using toliet, bedpan, or urinal?: Total Help from another person bathing (including washing, rinsing, drying)?: A Lot Help from another person to put on and taking off regular upper body clothing?: A Lot Help from another person to put on and taking off regular lower body clothing?: Total 6 Click Score: 12    End of Session Equipment Utilized During Treatment: Gait belt  OT Visit Diagnosis: Other abnormalities of gait and mobility (R26.89);Muscle weakness (generalized) (M62.81)   Activity Tolerance Patient tolerated treatment well   Patient Left in bed;with call [place]/phone within reach;with bed alarm set;with nursing/sitter in room;with family/visitor present   Nurse Communication Mobility status        Time: 1610-9604 OT Time Calculation (min): 25 min  Charges: OT General Charges $OT Visit: 1 Visit OT Treatments $Self Care/Home Management : 8-22 mins  Reniyah Gootee L. Francys Bolin, OTR/L  01/15/24, 1:01 PM

## 2024-01-16 ENCOUNTER — Telehealth: Payer: Self-pay

## 2024-01-16 DIAGNOSIS — J9601 Acute respiratory failure with hypoxia: Secondary | ICD-10-CM | POA: Diagnosis not present

## 2024-01-16 DIAGNOSIS — G20A1 Parkinson's disease without dyskinesia, without mention of fluctuations: Secondary | ICD-10-CM | POA: Diagnosis not present

## 2024-01-16 LAB — AEROBIC/ANAEROBIC CULTURE W GRAM STAIN (SURGICAL/DEEP WOUND): Culture: NEGATIVE

## 2024-01-16 NOTE — Transitions of Care (Post Inpatient/ED Visit) (Signed)
01/16/2024  Name: Deborah Velazquez MRN: [id number] DOB: 02/12/1946  Today's TOC FU Call Status: Today's TOC FU Call Status:: Successful TOC FU Call Completed TOC FU Call Complete Date: 01/16/24 Patient's Name and Date of Birth confirmed.  Transition Care Management Follow-up Telephone Call Date of Discharge: 01/15/24 Discharge Facility: [facility]) Type of Discharge: Inpatient Admission Primary Inpatient Discharge Diagnosis:: Sepsis due to urinary tract infection How have you been since you were released from the hospital?: Better Any questions or concerns?: No  Items Reviewed: Did you receive and understand the discharge instructions provided?: Yes Medications obtained,verified, and reconciled?: Yes (Medications Reviewed) (Medication reconciliation completed based on recent discharge summary Patient taking medications as instructed and is aware of any changes or dosage adjustments medication regimen. Patient denies questions and reports no barriers to medication adherence) Any new allergies since your discharge?: No Dietary orders reviewed?: Yes Type of Diet Ordered:: Also uses Supement ( Ensure or Boost) Do you have support at home?: Yes People in Home: spouse, child(ren), adult Name of Support/Comfort Primary Source: She has a rotation of 11 caregivers and 24/7 care at home 3 shifts 11-7,7-3,3-11 [facility] and Family cover these shifts  Medications Reviewed Today: Medications Reviewed Today     Reviewed by Johnnette Barrios, RN (Registered Nurse) on 01/16/24 at 1216  Med List Status: <None>   Medication Order Taking? Sig Documenting Provider Last Dose Status Informant  amoxicillin-clavulanate (AUGMENTIN) 875-125 MG tablet [id number] Yes Take 1 tablet by mouth 2 (two) times daily for 5 days. Esaw Grandchild A, DO Taking Active   aspirin EC 81 MG tablet [id number] Yes Take 81 mg by mouth daily. Swallow whole. [provider] Taking  Active Care Giver  atorvastatin (LIPITOR) 10 MG tablet [id number] Yes TAKE 1 TABLET(10 MG) BY MOUTH AT BEDTIME Sherlene Shams, MD Taking Active   carbidopa-levodopa (SINEMET CR) 50-200 MG tablet [id number] Yes Take 1 tablet by mouth 5 (five) times daily. Patient takes at 0700,1030,1400,1730,2200 [provider] Taking Active Care Giver  DULoxetine (CYMBALTA) 60 MG capsule [id number] Yes Take 60 mg by mouth daily.  [provider] Taking Active Care Giver  feeding supplement (ENSURE ENLIVE / ENSURE PLUS) LIQD [id number] Yes Take 237 mLs by mouth 2 (two) times daily between meals. Pennie Banter, DO Taking Active   lamoTRIgine (LAMICTAL) 100 MG tablet [id number] Yes Take 1 tablet by mouth 2 (two) times daily. [provider] Taking Active   nystatin (MYCOSTATIN/NYSTOP) powder [id number] Yes Apply 1 application. topically 2 (two) times daily. To rash until resolved. Sherlene Shams, MD Taking Active Care Giver  ondansetron (ZOFRAN-ODT) 4 MG disintegrating tablet [id number] Yes Take 1 tablet (4 mg total) by mouth every 8 (eight) hours as needed for nausea or vomiting. Esaw Grandchild A, DO Taking Active   pregabalin (LYRICA) 150 MG capsule [id number] Yes Take 150 mg by mouth 2 (two) times daily.  [provider] Taking Active Care Giver  QUEtiapine (SEROQUEL) 25 MG tablet [id number] Yes Take 0.5 tablets (12.5 mg total) by mouth at bedtime.  Patient taking differently: Take 12.5 mg by mouth 2 (two) times daily. 12.5mg  in Am and 25mg  PM   Alford Highland, MD Taking Active Care Giver  rivastigmine (EXELON) 1.5 MG capsule [id number] Yes Take 1.5 mg by mouth 2 (two) times daily. [provider] Taking Active            Med Note [facility], RAQUEL   Tue Jan 13, 2024 10:33 AM)  3mg  qHS  sertraline (ZOLOFT) 100 MG tablet [id number] Yes Take 200 mg by mouth daily.  [provider] Taking Active Care Giver          Medication reconciliation / review  completed based on most recent discharge summary and EHR medication list. Confirmed patient is taking all newly prescribed medications as instructed (any discrepancies are noted in review section)   Patient / Caregiver is aware of any changes to and / or  any dosage adjustments to medication regimen. Patient/ Caregiver denies questions at this time and reports no barriers to medication adherence.   Home Care and Equipment/Supplies: Were Home Health Services Ordered?: No Any new equipment or medical supplies ordered?: Yes Name of Medical supply agency?: [employer] delivery today Were you able to get the equipment/medical supplies?: Yes Do you have any questions related to the use of the equipment/supplies?: No (will use PRN)  Functional Questionnaire: Do you need assistance with bathing/showering or dressing?: Yes Do you need assistance with meal preparation?: Yes Do you need assistance with eating?: No Do you have difficulty maintaining continence: Yes Do you need assistance with getting out of bed/getting out of a chair/moving?: Yes (They have lift chair Hospital bed and Hoyer) Do you have difficulty managing or taking your medications?: Yes (Family sets up pill boxes and meds are administered by [employer] aides and family)  Follow up appointments reviewed: PCP Follow-up appointment confirmed?: No (They will call and schedue) MD Provider Line Number:[phone] Given: No Specialist Hospital Follow-up appointment confirmed?: NA Do you need transportation to your follow-up appointment?: No (Family/ Friend transport) Do you understand care options if your condition(s) worsen?: Yes-patient verbalized understanding  SDOH Interventions Today    Flowsheet Row Most Recent Value  SDOH Interventions   Food Insecurity Interventions Intervention Not Indicated  Housing Interventions Intervention Not Indicated  Transportation Interventions Intervention Not Indicated, Patient Resources (Friends/Family)   Utilities Interventions Intervention Not Indicated      Interventions Today    Flowsheet Row Most Recent Value  Chronic Disease   Chronic disease during today's visit Other  [UTI]  General Interventions   General Interventions Discussed/Reviewed General Interventions Discussed, General Interventions Reviewed, Doctor Visits  Doctor Visits Discussed/Reviewed PCP  PCP/Specialist Visits Compliance with follow-up visit  Exercise Interventions   Exercise Discussed/Reviewed Physical Activity  Physical Activity Discussed/Reviewed Physical Activity Reviewed  Nutrition Interventions   Nutrition Discussed/Reviewed Nutrition Reviewed, Nutrition Discussed  Pharmacy Interventions   Pharmacy Dicussed/Reviewed Medications and their functions  Safety Interventions   Safety Discussed/Reviewed Fall Risk, Safety Reviewed       Benefits reviewed  Based on current information and Insurance plan -Reviewed benefits accessible to patient, including details about eligibility options for care and  available value based care options  if any areas of needs were identified.  Reviewed patient/  caregiver's ability to access and / or  ability with navigating the benefits system..Amb Referral made if indicted , refer to orders section of note for details   Reviewed goals for care Patient/ Caregiver  verbalizes understanding of instructions and care plan provided. Patient / Caregiver was encouraged to make informed decisions about their care, actively participate in managing their health condition, and implement lifestyle changes as needed to promote independence and self-management of health care. There were no reported  barriers to care.   TOC program  Patient is at high risk for readmission and/or has history of  high utilization  Discussed VBCI  TOC program and weekly calls to patient to assess  condition/status, medication management  and provide support/education as indicated . Patient/ Caregiver voiced  understanding and declined enrollment in the 30-day [facility] Program.   Spoke with HCPOA she feels they have 24/7 coverage for patient and her spouse.They have all the equipment needed  They have managed her care and medications and did not feel she needed additional follow-up at this time     The patient has been provided with contact information for the care management team and has been advised to call with any health-related questions or concerns. Follow up as indicated with Care Team , or sooner should any new problems arise.    Susa Loffler , BSN, RN [facility]   VBCI-Population Health RN Care Manager Direct Dial [phone]  Fax: [phone] Website: [email or web address]

## 2024-01-17 LAB — CULTURE, BLOOD (ROUTINE X 2)
Culture: NO GROWTH
Culture: NO GROWTH
Special Requests: ADEQUATE
Special Requests: ADEQUATE

## 2024-01-20 DIAGNOSIS — G9050 Complex regional pain syndrome I, unspecified: Secondary | ICD-10-CM | POA: Diagnosis not present

## 2024-01-20 DIAGNOSIS — Z96642 Presence of left artificial hip joint: Secondary | ICD-10-CM | POA: Diagnosis not present

## 2024-01-20 DIAGNOSIS — Z79899 Other long term (current) drug therapy: Secondary | ICD-10-CM | POA: Diagnosis not present

## 2024-01-20 DIAGNOSIS — Z556 Problems related to health literacy: Secondary | ICD-10-CM | POA: Diagnosis not present

## 2024-01-20 DIAGNOSIS — N179 Acute kidney failure, unspecified: Secondary | ICD-10-CM | POA: Diagnosis not present

## 2024-01-20 DIAGNOSIS — G9341 Metabolic encephalopathy: Secondary | ICD-10-CM | POA: Diagnosis not present

## 2024-01-20 DIAGNOSIS — G629 Polyneuropathy, unspecified: Secondary | ICD-10-CM | POA: Diagnosis not present

## 2024-01-20 DIAGNOSIS — K589 Irritable bowel syndrome without diarrhea: Secondary | ICD-10-CM | POA: Diagnosis not present

## 2024-01-20 DIAGNOSIS — A419 Sepsis, unspecified organism: Secondary | ICD-10-CM | POA: Diagnosis not present

## 2024-01-20 DIAGNOSIS — E7800 Pure hypercholesterolemia, unspecified: Secondary | ICD-10-CM | POA: Diagnosis not present

## 2024-01-20 DIAGNOSIS — Z8701 Personal history of pneumonia (recurrent): Secondary | ICD-10-CM | POA: Diagnosis not present

## 2024-01-20 DIAGNOSIS — J9601 Acute respiratory failure with hypoxia: Secondary | ICD-10-CM | POA: Diagnosis not present

## 2024-01-20 DIAGNOSIS — F0284 Dementia in other diseases classified elsewhere, unspecified severity, with anxiety: Secondary | ICD-10-CM | POA: Diagnosis not present

## 2024-01-20 DIAGNOSIS — G2581 Restless legs syndrome: Secondary | ICD-10-CM | POA: Diagnosis not present

## 2024-01-20 DIAGNOSIS — Z7982 Long term (current) use of aspirin: Secondary | ICD-10-CM | POA: Diagnosis not present

## 2024-01-20 DIAGNOSIS — F32A Depression, unspecified: Secondary | ICD-10-CM | POA: Diagnosis not present

## 2024-01-20 DIAGNOSIS — M1990 Unspecified osteoarthritis, unspecified site: Secondary | ICD-10-CM | POA: Diagnosis not present

## 2024-01-20 DIAGNOSIS — F0283 Dementia in other diseases classified elsewhere, unspecified severity, with mood disturbance: Secondary | ICD-10-CM | POA: Diagnosis not present

## 2024-01-20 DIAGNOSIS — Z8673 Personal history of transient ischemic attack (TIA), and cerebral infarction without residual deficits: Secondary | ICD-10-CM | POA: Diagnosis not present

## 2024-01-20 DIAGNOSIS — J150 Pneumonia due to Klebsiella pneumoniae: Secondary | ICD-10-CM | POA: Diagnosis not present

## 2024-01-20 DIAGNOSIS — M4317 Spondylolisthesis, lumbosacral region: Secondary | ICD-10-CM | POA: Diagnosis not present

## 2024-01-20 DIAGNOSIS — S72001D Fracture of unspecified part of neck of right femur, subsequent encounter for closed fracture with routine healing: Secondary | ICD-10-CM | POA: Diagnosis not present

## 2024-01-20 DIAGNOSIS — R569 Unspecified convulsions: Secondary | ICD-10-CM | POA: Diagnosis not present

## 2024-01-20 DIAGNOSIS — R7303 Prediabetes: Secondary | ICD-10-CM | POA: Diagnosis not present

## 2024-01-20 DIAGNOSIS — Z87440 Personal history of urinary (tract) infections: Secondary | ICD-10-CM | POA: Diagnosis not present

## 2024-01-20 DIAGNOSIS — E872 Acidosis, unspecified: Secondary | ICD-10-CM | POA: Diagnosis not present

## 2024-01-20 DIAGNOSIS — Z9981 Dependence on supplemental oxygen: Secondary | ICD-10-CM | POA: Diagnosis not present

## 2024-01-20 DIAGNOSIS — N390 Urinary tract infection, site not specified: Secondary | ICD-10-CM | POA: Diagnosis not present

## 2024-01-20 DIAGNOSIS — J690 Pneumonitis due to inhalation of food and vomit: Secondary | ICD-10-CM | POA: Diagnosis not present

## 2024-01-20 DIAGNOSIS — G20A1 Parkinson's disease without dyskinesia, without mention of fluctuations: Secondary | ICD-10-CM | POA: Diagnosis not present

## 2024-01-20 DIAGNOSIS — G3183 Dementia with Lewy bodies: Secondary | ICD-10-CM | POA: Diagnosis not present

## 2024-01-21 ENCOUNTER — Telehealth: Payer: Self-pay

## 2024-01-21 DIAGNOSIS — G3183 Dementia with Lewy bodies: Secondary | ICD-10-CM | POA: Diagnosis not present

## 2024-01-21 DIAGNOSIS — Z8673 Personal history of transient ischemic attack (TIA), and cerebral infarction without residual deficits: Secondary | ICD-10-CM | POA: Diagnosis not present

## 2024-01-21 NOTE — Telephone Encounter (Signed)
 Copied from CRM [id number]. Topic: Clinical - Home Health Verbal Orders >> Jan 21, 2024 10:48 AM Drema Balzarine wrote: Caller/Agency: Angie from [facility]  Callback Number: [phone] Service Requested: Physical Therapy Frequency: 1 WEEK 1, 2 WEEK 4, 1 WEEK 4,  Any new concerns about the patient? No

## 2024-01-21 NOTE — Telephone Encounter (Signed)
 Verbal orders given

## 2024-02-13 DIAGNOSIS — G20A1 Parkinson's disease without dyskinesia, without mention of fluctuations: Secondary | ICD-10-CM | POA: Diagnosis not present

## 2024-02-13 DIAGNOSIS — J9601 Acute respiratory failure with hypoxia: Secondary | ICD-10-CM | POA: Diagnosis not present

## 2024-02-18 DIAGNOSIS — Z8673 Personal history of transient ischemic attack (TIA), and cerebral infarction without residual deficits: Secondary | ICD-10-CM | POA: Diagnosis not present

## 2024-02-18 DIAGNOSIS — G3183 Dementia with Lewy bodies: Secondary | ICD-10-CM | POA: Diagnosis not present

## 2024-03-09 DIAGNOSIS — R569 Unspecified convulsions: Secondary | ICD-10-CM | POA: Diagnosis not present

## 2024-03-09 DIAGNOSIS — G4719 Other hypersomnia: Secondary | ICD-10-CM | POA: Diagnosis not present

## 2024-03-09 DIAGNOSIS — G20A1 Parkinson's disease without dyskinesia, without mention of fluctuations: Secondary | ICD-10-CM | POA: Diagnosis not present

## 2024-03-09 DIAGNOSIS — F02B2 Dementia in other diseases classified elsewhere, moderate, with psychotic disturbance: Secondary | ICD-10-CM | POA: Diagnosis not present

## 2024-03-09 DIAGNOSIS — G3183 Dementia with Lewy bodies: Secondary | ICD-10-CM | POA: Diagnosis not present

## 2024-03-15 DIAGNOSIS — G20A1 Parkinson's disease without dyskinesia, without mention of fluctuations: Secondary | ICD-10-CM | POA: Diagnosis not present

## 2024-03-15 DIAGNOSIS — J9601 Acute respiratory failure with hypoxia: Secondary | ICD-10-CM | POA: Diagnosis not present

## 2024-03-17 DIAGNOSIS — F411 Generalized anxiety disorder: Secondary | ICD-10-CM | POA: Diagnosis not present

## 2024-03-17 DIAGNOSIS — G20B2 Parkinson's disease with dyskinesia, with fluctuations: Secondary | ICD-10-CM | POA: Diagnosis not present

## 2024-03-17 DIAGNOSIS — F332 Major depressive disorder, recurrent severe without psychotic features: Secondary | ICD-10-CM | POA: Diagnosis not present

## 2024-03-17 DIAGNOSIS — G4700 Insomnia, unspecified: Secondary | ICD-10-CM | POA: Diagnosis not present

## 2024-03-18 ENCOUNTER — Telehealth: Payer: Self-pay

## 2024-03-18 NOTE — Telephone Encounter (Signed)
 LMTCB

## 2024-03-18 NOTE — Telephone Encounter (Signed)
 Copied from CRM [id number]. Topic: Clinical - Home Health Verbal Orders >> Mar 18, 2024  8:20 AM Varney Gentleman wrote: Caller/Agency: Angie/[facility] Callback Number: [phone] Secure Line Service Requested: Physical Therapy Frequency: Continue 1 week 8 Any new concerns about the patient? No

## 2024-03-19 NOTE — Telephone Encounter (Signed)
 LMTCB

## 2024-03-20 DIAGNOSIS — G3183 Dementia with Lewy bodies: Secondary | ICD-10-CM | POA: Diagnosis not present

## 2024-03-20 DIAGNOSIS — Z8673 Personal history of transient ischemic attack (TIA), and cerebral infarction without residual deficits: Secondary | ICD-10-CM | POA: Diagnosis not present

## 2024-03-22 NOTE — Telephone Encounter (Signed)
 Verbal orders given

## 2024-04-14 DIAGNOSIS — J9601 Acute respiratory failure with hypoxia: Secondary | ICD-10-CM | POA: Diagnosis not present

## 2024-04-14 DIAGNOSIS — G20A1 Parkinson's disease without dyskinesia, without mention of fluctuations: Secondary | ICD-10-CM | POA: Diagnosis not present

## 2024-04-19 ENCOUNTER — Other Ambulatory Visit: Payer: Self-pay | Admitting: Internal Medicine

## 2024-05-07 ENCOUNTER — Ambulatory Visit: Payer: Self-pay

## 2024-05-07 ENCOUNTER — Emergency Department
Admission: EM | Admit: 2024-05-07 | Discharge: 2024-05-08 | Disposition: A | Discharge status: Home / Self Care | Source: Home / Self Care | Attending: Emergency Medicine | Admitting: Emergency Medicine

## 2024-05-07 ENCOUNTER — Other Ambulatory Visit: Payer: Self-pay

## 2024-05-07 ENCOUNTER — Emergency Department

## 2024-05-07 DIAGNOSIS — N3000 Acute cystitis without hematuria: Secondary | ICD-10-CM | POA: Diagnosis not present

## 2024-05-07 DIAGNOSIS — R9431 Abnormal electrocardiogram [ECG] [EKG]: Secondary | ICD-10-CM | POA: Diagnosis not present

## 2024-05-07 DIAGNOSIS — F0390 Unspecified dementia without behavioral disturbance: Secondary | ICD-10-CM | POA: Insufficient documentation

## 2024-05-07 DIAGNOSIS — R509 Fever, unspecified: Secondary | ICD-10-CM

## 2024-05-07 DIAGNOSIS — R0989 Other specified symptoms and signs involving the circulatory and respiratory systems: Secondary | ICD-10-CM | POA: Diagnosis not present

## 2024-05-07 DIAGNOSIS — N390 Urinary tract infection, site not specified: Secondary | ICD-10-CM | POA: Diagnosis not present

## 2024-05-07 DIAGNOSIS — E876 Hypokalemia: Secondary | ICD-10-CM | POA: Insufficient documentation

## 2024-05-07 LAB — COMPREHENSIVE METABOLIC PANEL WITH GFR
ALT: 7 U/L (ref 0–44)
AST: 18 U/L (ref 15–41)
Albumin: 2.8 g/dL — ABNORMAL LOW (ref 3.5–5.0)
Alkaline Phosphatase: 60 U/L (ref 38–126)
Anion gap: 9 (ref 5–15)
BUN: 11 mg/dL (ref 8–23)
CO2: 25 mmol/L (ref 22–32)
Calcium: 8.2 mg/dL — ABNORMAL LOW (ref 8.9–10.3)
Chloride: 103 mmol/L (ref 98–111)
Creatinine, Ser: 0.69 mg/dL (ref 0.44–1.00)
GFR, Estimated: >60 mL/min (ref >60)
Glucose, Bld: 162 mg/dL — ABNORMAL HIGH (ref 70–99)
Potassium: 3.2 mmol/L — ABNORMAL LOW (ref 3.5–5.1)
Sodium: 137 mmol/L (ref 135–145)
Total Bilirubin: 0.5 mg/dL (ref 0.0–1.2)
Total Protein: 6.5 g/dL (ref 6.5–8.1)

## 2024-05-07 LAB — CBC WITH DIFFERENTIAL/PLATELET
Abs Immature Granulocytes: 0.05 10*3/uL (ref 0.00–0.07)
Basophils Absolute: 0 10*3/uL (ref 0.0–0.1)
Basophils Relative: 0 %
Eosinophils Absolute: 0 10*3/uL (ref 0.0–0.5)
Eosinophils Relative: 0 %
HCT: 32.3 % — ABNORMAL LOW (ref 36.0–46.0)
Hemoglobin: 10.3 g/dL — ABNORMAL LOW (ref 12.0–15.0)
Immature Granulocytes: 1 %
Lymphocytes Relative: 12 %
Lymphs Abs: 1.2 10*3/uL (ref 0.7–4.0)
MCH: 27.8 pg (ref 26.0–34.0)
MCHC: 31.9 g/dL (ref 30.0–36.0)
MCV: 87.1 fL (ref 80.0–100.0)
Monocytes Absolute: 0.8 10*3/uL (ref 0.1–1.0)
Monocytes Relative: 9 %
Neutro Abs: 7.4 10*3/uL (ref 1.7–7.7)
Neutrophils Relative %: 78 %
Platelets: 264 10*3/uL (ref 150–400)
RBC: 3.71 MIL/uL — ABNORMAL LOW (ref 3.87–5.11)
RDW: 16.3 % — ABNORMAL HIGH (ref 11.5–15.5)
WBC: 9.5 10*3/uL (ref 4.0–10.5)
nRBC: 0 % (ref 0.0–0.2)

## 2024-05-07 LAB — URINALYSIS, W/ REFLEX TO CULTURE (INFECTION SUSPECTED)
Bilirubin Urine: NEGATIVE
Glucose, UA: NEGATIVE mg/dL
Ketones, ur: 5 mg/dL — AB
Nitrite: POSITIVE — AB
Protein, ur: 30 mg/dL — AB
RBC / HPF: >50 RBC/hpf (ref 0–5)
Specific Gravity, Urine: 1.01 (ref 1.005–1.030)
Squamous Epithelial / HPF: 0 /HPF (ref 0–5)
WBC, UA: >50 WBC/hpf (ref 0–5)
pH: 5 (ref 5.0–8.0)

## 2024-05-07 LAB — LACTIC ACID, PLASMA: Lactic Acid, Venous: 1.6 mmol/L (ref 0.5–1.9)

## 2024-05-07 MED ORDER — SODIUM CHLORIDE 0.9 % IV SOLN
2.0000 g | Freq: Once | INTRAVENOUS | Status: AC
  Administered 2024-05-07: 2 g via INTRAVENOUS
  Filled 2024-05-07: qty 20

## 2024-05-07 MED ORDER — SODIUM CHLORIDE 0.9 % IV BOLUS
1000.0000 mL | Freq: Once | INTRAVENOUS | Status: AC
  Administered 2024-05-07: 1000 mL via INTRAVENOUS

## 2024-05-07 MED ORDER — POTASSIUM CHLORIDE CRYS ER 20 MEQ PO TBCR
40.0000 meq | EXTENDED_RELEASE_TABLET | Freq: Once | ORAL | Status: AC
  Administered 2024-05-07: 40 meq via ORAL
  Filled 2024-05-07: qty 2

## 2024-05-07 MED ORDER — AMOXICILLIN-POT CLAVULANATE 875-125 MG PO TABS: 1.0000 | ORAL_TABLET | Freq: Two times a day (BID) | ORAL | 0 refills | Status: DC

## 2024-05-07 NOTE — Telephone Encounter (Signed)
Pt is currently at the ED

## 2024-05-07 NOTE — Discharge Instructions (Signed)
 You were seen in the emergency room today for evaluation of your fever.  Your testing was concerning for a UTI, but fortunately did not show signs of sepsis.  Take your antibiotics as directed.  Follow with your primary care doctor within a few days for reevaluation.  Return to the ER for new any new or worsening symptoms.

## 2024-05-07 NOTE — ED Notes (Signed)
 Secretary called [facility] for update.  [facility]rew is currently transporting another patient and will be here after that transport.  Pt POA updated.

## 2024-05-07 NOTE — Telephone Encounter (Signed)
 FYI Only or Action Required?: Action required by provider  Patient was last seen in primary care on 06/04/2023 by Thersia Flax, MD. Called Nurse Triage reporting Fever. Symptoms began a week ago. Interventions attempted: Rest, hydration, or home remedies. Symptoms are: gradually worsening.  Triage Disposition: Go to ED or PCP/Alternative with Approval  Patient/caregiver understands and will follow disposition?: Yes  Message from [facility] sent at 05/07/2024 11:58 AM EDT  Copied From CRM [id number]. Reason for Triage: Last 16 hrs has started running a slight fever, was over 100 during the night but around 100.5 now - sleeping long periods, goes a long time without eating or drinking due to sleep, only able to get her to have a few sips - been asleep for 13 hours now. Patient has slept as many as 30 hours. Oxygen  levels have slightly dropped - it goes down to 88 when it's taken off. They believe she may be getting a UTI. Would like advice on to go to the ER or not. Transportation needed for patient for visits, it is hard to get her into the office/ER so they want to know if they can ride it out of if she needs medical assistance. Previously had sepsis.   Reason for Disposition  [1] Drinking very little AND [2] dehydration suspected (e.g., no urine > 12 hours, very dry mouth, very lightheaded)  Answer Assessment - Initial Assessment Questions 1. TEMPERATURE: What is the most recent temperature?  How was it measured?      100.5 2. ONSET: When did the fever start?      05/06/24 3. CHILLS: Do you have chills? If yes: How bad are they?  (e.g., none, mild, moderate, severe)   - NONE: no chills   - MILD: feeling cold   - MODERATE: feeling very cold, some shivering (feels better under a thick blanket)   - SEVERE: feeling extremely cold with shaking chills (general body shaking, rigors; even under a thick blanket)      Yes 4. OTHER SYMPTOMS: Do you have any other symptoms besides the fever?   (e.g., abdomen pain, cough, diarrhea, earache, headache, sore throat, urination pain)     Diaphoretic last evening 5. CAUSE: If there are no symptoms, ask: What do you think is causing the fever?      Feels maybe a uti.   Additional info: on oxygen  96% baseline, now 92% with o2, when removed 88%. Had brown urine last week that seems to have resolved. When she is getting ill she will begin refusing PO, becomes dehydrated, has mental status changes and begins sleeping excessively,  when this happens caregivers have difficulty getting her to rehydrate. Requesting resources: wondering for the future if a service is available to come to the home for IV fluids when needed, collecting urine samples etc.  Protocols used: [facility]

## 2024-05-07 NOTE — ED Notes (Signed)
 [facility] was called spoke with Pam to transfer patient home

## 2024-05-07 NOTE — ED Provider Notes (Signed)
 [facility] Provider Note    Event Date/Time   First MD Initiated Contact with Patient 05/07/24 1611     (approximate)   History   Possible UTI   HPI  Deborah Velazquez is a 78 year old female with history of Parkinson's disease, seizure disorder, Lewy body dementia presenting to the emergency department for evaluation of fever.  Accompanied by POA.  She notes that patient has significant somnolence during the daytime.  Yesterday, they noticed that the patient had a fever up to 101 at home.  They have also noticed a strong odor to her urine for the past several weeks.  They note this is similar to how she has presented with a UTI in the past.  She is on home oxygen  as needed.  They do note that the patient is quite somnolent throughout the day and can become dehydrated in the setting of this.      Physical Exam   Triage Vital Signs: ED Triage Vitals  Encounter Vitals Group     BP 05/07/24 1613 (!) 131/108     Girls Systolic BP Percentile --      Girls Diastolic BP Percentile --      Boys Systolic BP Percentile --      Boys Diastolic BP Percentile --      Pulse Rate 05/07/24 1613 71     Resp 05/07/24 1613 20     Temp 05/07/24 1611 98.4 F (36.9 C)     Temp Source 05/07/24 1611 Oral     SpO2 05/07/24 1613 90 %     Weight --      Height --      Head Circumference --      Peak Flow --      Pain Score --      Pain Loc --      Pain Education --      Exclude from Growth Chart --     Most recent vital signs: Vitals:   05/07/24 1900 05/07/24 2000  BP: 118/66 124/76  Pulse: 65   Resp: 16 18  Temp:    SpO2: (!) 87% 94%     General: Somnolent, briefly arouses and appropriately answers questions before going back to sleep CV:  Regular rate, good peripheral perfusion.  Resp:  Unlabored respirations, satting in the mid 90s on 2 L Abd:  Nondistended, soft, no appreciable tenderness Neuro:  Symmetric facial movement, fluid speech   ED Results /  Procedures / Treatments   Labs (all labs ordered are listed, but only abnormal results are displayed) Labs Reviewed  CBC WITH DIFFERENTIAL/PLATELET - Abnormal; Notable for the following components:      Result Value   RBC 3.71 (*)    Hemoglobin 10.3 (*)    HCT 32.3 (*)    RDW 16.3 (*)    All other components within normal limits  COMPREHENSIVE METABOLIC PANEL WITH GFR - Abnormal; Notable for the following components:   Potassium 3.2 (*)    Glucose, Bld 162 (*)    Calcium  8.2 (*)    Albumin  2.8 (*)    All other components within normal limits  URINALYSIS, W/ REFLEX TO CULTURE (INFECTION SUSPECTED) - Abnormal; Notable for the following components:   Color, Urine AMBER (*)    APPearance TURBID (*)    Hgb urine dipstick SMALL (*)    Ketones, ur 5 (*)    Protein, ur 30 (*)    Nitrite POSITIVE (*)  Leukocytes,Ua LARGE (*)    Bacteria, UA MANY (*)    All other components within normal limits  CULTURE, BLOOD (ROUTINE X 2)  CULTURE, BLOOD (ROUTINE X 2)  URINE CULTURE  LACTIC ACID, PLASMA     EKG EKG independently reviewed and interpreted by myself demonstrates:  EKG demonstrates sinus rhythm at a rate of 71, PR 176, QRS 100, QTc 423, no acute ST changes  RADIOLOGY Imaging independently reviewed and interpreted by myself demonstrates:  CXR without focal consolidation  Formal Radiology Read:  DG Chest Portable 1 View Result Date: 05/07/2024 CLINICAL DATA:  Fever EXAM: PORTABLE CHEST 1 VIEW COMPARISON:  01/09/2024 FINDINGS: Low lung volumes with exaggerated cardiomediastinal silhouette. No consolidation, pleural effusion or pneumothorax. IMPRESSION: Low lung volumes.  No focal airspace opacity. Electronically Signed   By: Esmeralda Hedge M.D.   On: 05/07/2024 16:41    PROCEDURES:  Critical Care performed: No  Procedures   MEDICATIONS ORDERED IN ED: Medications  sodium chloride  0.9 % bolus 1,000 mL (0 mLs Intravenous Stopped 05/07/24 1830)  cefTRIAXone  (ROCEPHIN ) 2 g in  sodium chloride  0.9 % 100 mL IVPB (2 g Intravenous New Bag/Given 05/07/24 1933)  potassium chloride  SA (KLOR-CON  M) CR tablet 40 mEq (40 mEq Oral Given 05/07/24 1937)     IMPRESSION / MDM / ASSESSMENT AND PLAN / ED COURSE  I reviewed the triage vital signs and the nursing notes.  Differential diagnosis includes, but is not limited to, UTI, pneumonia, viral illness, other infection  Patient's presentation is most consistent with acute presentation with potential threat to life or bodily function.  78 year old female presenting with fever.  Afebrile here with borderline hypoxia but otherwise reassuring vital signs.  Labs with mild hypokalemia.  CMP with stable anemia.  Normal lactate.  Chest x-Kimisha Eunice without focal consolidation.  Ordered for IV fluids, will obtain urinalysis.  Urinalysis is concerning for infection with positive nitrite, large leukocyte esterase, greater 50 the white blood cells and many bacteria on a clean sample.  Urine culture sent.  Clinical Course as of 05/07/24 2018  Fri May 07, 2024  1610 Did review patient's prior microbiology data.  Note that she has previously grown Klebsiella and more recently low CFU E. Faecalis.  Reviewed with pharmacy team.  They recommended dose of IV Rocephin  here, reasonable to transition to Augmentin  for oral antibiotic choice.  Patient reassessed.  Awake, denying any abdominal pain.  Updated on results of workup.  I did consider admission given her prior complicated history, but family feels she is at baseline and is comfortable with a dose of IV antibiotics here and discharged on oral antibiotics.  No evidence of sepsis.  Strict return precautions provided.  Patient discharged in stable condition. [NR]    Clinical Course User Index [NR] Claria Crofts, MD     FINAL CLINICAL IMPRESSION(S) / ED DIAGNOSES   Final diagnoses:  Acute cystitis without hematuria  Fever, unspecified fever cause     Rx / DC Orders   ED Discharge Orders           Ordered    amoxicillin -clavulanate (AUGMENTIN ) 875-125 MG tablet  2 times daily        05/07/24 1936             Note:  This document was prepared using Dragon voice recognition software and may include unintentional dictation errors.   Claria Crofts, MD 05/07/24 2018

## 2024-05-07 NOTE — ED Notes (Addendum)
 Pt becoming more confused.  Pt pulled out purewick and seems slightly irritable. Per POA this happens in the evenings.

## 2024-05-07 NOTE — ED Triage Notes (Signed)
 Pt to ED via [facility] from home for c/o possible UTI. Pt has hx Parkinson's and dementia. Family states strong odor to urine x 2 weeks and reports fever started last night.  Temp 99.1 HR 70 BP 107/63 Cbg 180

## 2024-05-08 ENCOUNTER — Encounter: Payer: Self-pay | Admitting: Intensive Care

## 2024-05-08 ENCOUNTER — Emergency Department

## 2024-05-08 ENCOUNTER — Telehealth: Payer: Self-pay | Admitting: Emergency Medicine

## 2024-05-08 ENCOUNTER — Inpatient Hospital Stay
Admission: EM | Admit: 2024-05-08 | Discharge: 2024-05-11 | DRG: 872 | Disposition: A | Discharge status: Home Health | Attending: Family Medicine | Admitting: Family Medicine

## 2024-05-08 ENCOUNTER — Other Ambulatory Visit: Payer: Self-pay

## 2024-05-08 DIAGNOSIS — A419 Sepsis, unspecified organism: Secondary | ICD-10-CM | POA: Diagnosis present

## 2024-05-08 DIAGNOSIS — N390 Urinary tract infection, site not specified: Secondary | ICD-10-CM | POA: Diagnosis not present

## 2024-05-08 DIAGNOSIS — A4159 Other Gram-negative sepsis: Secondary | ICD-10-CM | POA: Diagnosis not present

## 2024-05-08 DIAGNOSIS — Z8249 Family history of ischemic heart disease and other diseases of the circulatory system: Secondary | ICD-10-CM

## 2024-05-08 DIAGNOSIS — G3183 Dementia with Lewy bodies: Secondary | ICD-10-CM | POA: Diagnosis present

## 2024-05-08 DIAGNOSIS — R4182 Altered mental status, unspecified: Secondary | ICD-10-CM | POA: Diagnosis not present

## 2024-05-08 DIAGNOSIS — E876 Hypokalemia: Secondary | ICD-10-CM | POA: Diagnosis present

## 2024-05-08 DIAGNOSIS — G9389 Other specified disorders of brain: Secondary | ICD-10-CM | POA: Diagnosis present

## 2024-05-08 DIAGNOSIS — Z820 Family history of epilepsy and other diseases of the nervous system: Secondary | ICD-10-CM

## 2024-05-08 DIAGNOSIS — Z1152 Encounter for screening for COVID-19: Secondary | ICD-10-CM | POA: Diagnosis not present

## 2024-05-08 DIAGNOSIS — Z7982 Long term (current) use of aspirin: Secondary | ICD-10-CM

## 2024-05-08 DIAGNOSIS — N3000 Acute cystitis without hematuria: Secondary | ICD-10-CM | POA: Diagnosis not present

## 2024-05-08 DIAGNOSIS — G20A1 Parkinson's disease without dyskinesia, without mention of fluctuations: Secondary | ICD-10-CM | POA: Diagnosis present

## 2024-05-08 DIAGNOSIS — G40909 Epilepsy, unspecified, not intractable, without status epilepticus: Secondary | ICD-10-CM | POA: Diagnosis present

## 2024-05-08 DIAGNOSIS — F0282 Dementia in other diseases classified elsewhere, unspecified severity, with psychotic disturbance: Secondary | ICD-10-CM | POA: Diagnosis present

## 2024-05-08 DIAGNOSIS — F02818 Dementia in other diseases classified elsewhere, unspecified severity, with other behavioral disturbance: Secondary | ICD-10-CM | POA: Diagnosis present

## 2024-05-08 DIAGNOSIS — Z833 Family history of diabetes mellitus: Secondary | ICD-10-CM | POA: Diagnosis not present

## 2024-05-08 DIAGNOSIS — N200 Calculus of kidney: Secondary | ICD-10-CM | POA: Diagnosis present

## 2024-05-08 DIAGNOSIS — Z9981 Dependence on supplemental oxygen: Secondary | ICD-10-CM | POA: Diagnosis not present

## 2024-05-08 DIAGNOSIS — R9431 Abnormal electrocardiogram [ECG] [EKG]: Secondary | ICD-10-CM | POA: Diagnosis not present

## 2024-05-08 DIAGNOSIS — R935 Abnormal findings on diagnostic imaging of other abdominal regions, including retroperitoneum: Secondary | ICD-10-CM | POA: Diagnosis not present

## 2024-05-08 DIAGNOSIS — Z96642 Presence of left artificial hip joint: Secondary | ICD-10-CM | POA: Diagnosis present

## 2024-05-08 DIAGNOSIS — B961 Klebsiella pneumoniae [K. pneumoniae] as the cause of diseases classified elsewhere: Secondary | ICD-10-CM | POA: Diagnosis present

## 2024-05-08 DIAGNOSIS — R7881 Bacteremia: Principal | ICD-10-CM

## 2024-05-08 DIAGNOSIS — F419 Anxiety disorder, unspecified: Secondary | ICD-10-CM | POA: Diagnosis present

## 2024-05-08 DIAGNOSIS — E7800 Pure hypercholesterolemia, unspecified: Secondary | ICD-10-CM | POA: Diagnosis present

## 2024-05-08 DIAGNOSIS — Z66 Do not resuscitate: Secondary | ICD-10-CM | POA: Diagnosis present

## 2024-05-08 DIAGNOSIS — R5381 Other malaise: Secondary | ICD-10-CM | POA: Diagnosis present

## 2024-05-08 DIAGNOSIS — G4710 Hypersomnia, unspecified: Secondary | ICD-10-CM | POA: Diagnosis not present

## 2024-05-08 DIAGNOSIS — I6782 Cerebral ischemia: Secondary | ICD-10-CM | POA: Diagnosis not present

## 2024-05-08 DIAGNOSIS — Z7401 Bed confinement status: Secondary | ICD-10-CM | POA: Diagnosis not present

## 2024-05-08 DIAGNOSIS — G2581 Restless legs syndrome: Secondary | ICD-10-CM | POA: Diagnosis present

## 2024-05-08 DIAGNOSIS — R0989 Other specified symptoms and signs involving the circulatory and respiratory systems: Secondary | ICD-10-CM | POA: Diagnosis not present

## 2024-05-08 DIAGNOSIS — Z803 Family history of malignant neoplasm of breast: Secondary | ICD-10-CM | POA: Diagnosis not present

## 2024-05-08 DIAGNOSIS — R509 Fever, unspecified: Secondary | ICD-10-CM | POA: Diagnosis not present

## 2024-05-08 DIAGNOSIS — Z8673 Personal history of transient ischemic attack (TIA), and cerebral infarction without residual deficits: Secondary | ICD-10-CM

## 2024-05-08 DIAGNOSIS — R451 Restlessness and agitation: Secondary | ICD-10-CM | POA: Diagnosis present

## 2024-05-08 DIAGNOSIS — Z79899 Other long term (current) drug therapy: Secondary | ICD-10-CM

## 2024-05-08 DIAGNOSIS — N136 Pyonephrosis: Secondary | ICD-10-CM | POA: Diagnosis present

## 2024-05-08 DIAGNOSIS — Z888 Allergy status to other drugs, medicaments and biological substances status: Secondary | ICD-10-CM

## 2024-05-08 DIAGNOSIS — Z993 Dependence on wheelchair: Secondary | ICD-10-CM | POA: Diagnosis not present

## 2024-05-08 DIAGNOSIS — R0902 Hypoxemia: Secondary | ICD-10-CM | POA: Diagnosis not present

## 2024-05-08 DIAGNOSIS — E860 Dehydration: Secondary | ICD-10-CM | POA: Diagnosis not present

## 2024-05-08 DIAGNOSIS — N12 Tubulo-interstitial nephritis, not specified as acute or chronic: Secondary | ICD-10-CM

## 2024-05-08 DIAGNOSIS — S0990XA Unspecified injury of head, initial encounter: Secondary | ICD-10-CM | POA: Diagnosis not present

## 2024-05-08 DIAGNOSIS — R410 Disorientation, unspecified: Secondary | ICD-10-CM | POA: Diagnosis not present

## 2024-05-08 HISTORY — DX: Sepsis, unspecified organism: A41.9

## 2024-05-08 LAB — BLOOD CULTURE ID PANEL (REFLEXED) - BCID2

## 2024-05-08 LAB — PROTIME-INR
INR: 1.1 (ref 0.8–1.2)
Prothrombin Time: 14.6 s (ref 11.4–15.2)

## 2024-05-08 LAB — CBC WITH DIFFERENTIAL/PLATELET
Abs Immature Granulocytes: 0.04 10*3/uL (ref 0.00–0.07)
Basophils Absolute: 0 10*3/uL (ref 0.0–0.1)
Basophils Relative: 1 %
Eosinophils Absolute: 0 10*3/uL (ref 0.0–0.5)
Eosinophils Relative: 0 %
HCT: 33.1 % — ABNORMAL LOW (ref 36.0–46.0)
Hemoglobin: 10.7 g/dL — ABNORMAL LOW (ref 12.0–15.0)
Immature Granulocytes: 1 %
Lymphocytes Relative: 14 %
Lymphs Abs: 1.3 10*3/uL (ref 0.7–4.0)
MCH: 27.6 pg (ref 26.0–34.0)
MCHC: 32.3 g/dL (ref 30.0–36.0)
MCV: 85.3 fL (ref 80.0–100.0)
Monocytes Absolute: 0.7 10*3/uL (ref 0.1–1.0)
Monocytes Relative: 8 %
Neutro Abs: 6.7 10*3/uL (ref 1.7–7.7)
Neutrophils Relative %: 76 %
Platelets: 265 10*3/uL (ref 150–400)
RBC: 3.88 MIL/uL (ref 3.87–5.11)
RDW: 16 % — ABNORMAL HIGH (ref 11.5–15.5)
WBC: 8.7 10*3/uL (ref 4.0–10.5)
nRBC: 0 % (ref 0.0–0.2)

## 2024-05-08 LAB — COMPREHENSIVE METABOLIC PANEL WITH GFR
ALT: <5 U/L (ref 0–44)
AST: 14 U/L — ABNORMAL LOW (ref 15–41)
Albumin: 3.1 g/dL — ABNORMAL LOW (ref 3.5–5.0)
Alkaline Phosphatase: 64 U/L (ref 38–126)
Anion gap: 10 (ref 5–15)
BUN: 8 mg/dL (ref 8–23)
CO2: 23 mmol/L (ref 22–32)
Calcium: 8.9 mg/dL (ref 8.9–10.3)
Chloride: 107 mmol/L (ref 98–111)
Creatinine, Ser: 0.5 mg/dL (ref 0.44–1.00)
GFR, Estimated: >60 mL/min (ref >60)
Glucose, Bld: 129 mg/dL — ABNORMAL HIGH (ref 70–99)
Potassium: 3.4 mmol/L — ABNORMAL LOW (ref 3.5–5.1)
Sodium: 140 mmol/L (ref 135–145)
Total Bilirubin: 0.8 mg/dL (ref 0.0–1.2)
Total Protein: 7.2 g/dL (ref 6.5–8.1)

## 2024-05-08 LAB — RESP PANEL BY RT-PCR (RSV, FLU A&B, COVID)  RVPGX2
Influenza A by PCR: NEGATIVE
Influenza B by PCR: NEGATIVE
Resp Syncytial Virus by PCR: NEGATIVE
SARS Coronavirus 2 by RT PCR: NEGATIVE

## 2024-05-08 LAB — LACTIC ACID, PLASMA: Lactic Acid, Venous: 0.7 mmol/L (ref 0.5–1.9)

## 2024-05-08 MED ORDER — ATORVASTATIN CALCIUM 20 MG PO TABS
20.0000 mg | ORAL_TABLET | Freq: Every day | ORAL | Status: DC
  Administered 2024-05-09 – 2024-05-11 (×3): 20 mg via ORAL
  Filled 2024-05-08 (×3): qty 1

## 2024-05-08 MED ORDER — HYDROCODONE-ACETAMINOPHEN 5-325 MG PO TABS: 1.0000 | ORAL_TABLET | Freq: Four times a day (QID) | ORAL | Status: DC | PRN

## 2024-05-08 MED ORDER — QUETIAPINE FUMARATE 25 MG PO TABS
25.0000 mg | ORAL_TABLET | Freq: Every day | ORAL | Status: DC
  Administered 2024-05-08 – 2024-05-10 (×3): 25 mg via ORAL
  Filled 2024-05-08 (×3): qty 1

## 2024-05-08 MED ORDER — MUSCLE RUB 10-15 % EX CREA: 1.0000 | TOPICAL_CREAM | CUTANEOUS | Status: DC | PRN

## 2024-05-08 MED ORDER — RIVASTIGMINE TARTRATE 3 MG PO CAPS: 3.0000 mg | ORAL_CAPSULE | Freq: Every day | ORAL | Status: DC

## 2024-05-08 MED ORDER — QUETIAPINE FUMARATE 25 MG PO TABS
12.5000 mg | ORAL_TABLET | Freq: Every morning | ORAL | Status: DC
  Administered 2024-05-09 – 2024-05-11 (×2): 12.5 mg via ORAL
  Filled 2024-05-08 (×3): qty 1

## 2024-05-08 MED ORDER — CARBIDOPA-LEVODOPA ER 50-200 MG PO TBCR
1.0000 | EXTENDED_RELEASE_TABLET | Freq: Every day | ORAL | Status: DC
  Administered 2024-05-08 – 2024-05-11 (×11): 1 via ORAL
  Filled 2024-05-08 (×17): qty 1

## 2024-05-08 MED ORDER — SODIUM CHLORIDE 0.9 % IV SOLN
2.0000 g | INTRAVENOUS | Status: DC
  Administered 2024-05-08 – 2024-05-10 (×3): 2 g via INTRAVENOUS
  Filled 2024-05-08 (×4): qty 20

## 2024-05-08 MED ORDER — LAMOTRIGINE 100 MG PO TABS
100.0000 mg | ORAL_TABLET | Freq: Two times a day (BID) | ORAL | Status: DC
  Administered 2024-05-08 – 2024-05-11 (×6): 100 mg via ORAL
  Filled 2024-05-08 (×6): qty 1

## 2024-05-08 MED ORDER — PREGABALIN 75 MG PO CAPS
150.0000 mg | ORAL_CAPSULE | Freq: Two times a day (BID) | ORAL | Status: DC
  Administered 2024-05-08 – 2024-05-11 (×6): 150 mg via ORAL
  Filled 2024-05-08 (×6): qty 2

## 2024-05-08 MED ORDER — SALINE SPRAY 0.65 % NA SOLN: 1.0000 | NASAL | Status: DC | PRN

## 2024-05-08 MED ORDER — LABETALOL HCL 5 MG/ML IV SOLN: 10.0000 mg | INTRAVENOUS | Status: DC | PRN

## 2024-05-08 MED ORDER — POTASSIUM CHLORIDE CRYS ER 20 MEQ PO TBCR
40.0000 meq | EXTENDED_RELEASE_TABLET | Freq: Once | ORAL | Status: AC
  Administered 2024-05-08: 40 meq via ORAL
  Filled 2024-05-08: qty 2

## 2024-05-08 MED ORDER — ASPIRIN 81 MG PO TBEC
81.0000 mg | DELAYED_RELEASE_TABLET | Freq: Every day | ORAL | Status: DC
  Administered 2024-05-09 – 2024-05-11 (×3): 81 mg via ORAL
  Filled 2024-05-08 (×3): qty 1

## 2024-05-08 MED ORDER — ACETAMINOPHEN 325 MG PO TABS: 650.0000 mg | ORAL_TABLET | Freq: Four times a day (QID) | ORAL | Status: DC | PRN

## 2024-05-08 MED ORDER — ENSURE ENLIVE PO LIQD
237.0000 mL | Freq: Two times a day (BID) | ORAL | Status: DC
  Administered 2024-05-09 – 2024-05-11 (×5): 237 mL via ORAL

## 2024-05-08 MED ORDER — HEPARIN SODIUM (PORCINE) 5000 UNIT/ML IJ SOLN
5000.0000 [IU] | Freq: Three times a day (TID) | INTRAMUSCULAR | Status: DC
  Administered 2024-05-08 – 2024-05-11 (×5): 5000 [IU] via SUBCUTANEOUS
  Filled 2024-05-08 (×7): qty 1

## 2024-05-08 MED ORDER — HYDRALAZINE HCL 20 MG/ML IJ SOLN: 10.0000 mg | Freq: Four times a day (QID) | INTRAMUSCULAR | Status: DC | PRN

## 2024-05-08 MED ORDER — SERTRALINE HCL 50 MG PO TABS: 200.0000 mg | ORAL_TABLET | Freq: Every day | ORAL | Status: DC

## 2024-05-08 MED ORDER — SODIUM CHLORIDE 0.9 % IV BOLUS
1000.0000 mL | Freq: Once | INTRAVENOUS | Status: AC
  Administered 2024-05-08: 1000 mL via INTRAVENOUS

## 2024-05-08 MED ORDER — ONDANSETRON 4 MG PO TBDP: 4.0000 mg | ORAL_TABLET | Freq: Three times a day (TID) | ORAL | Status: DC | PRN

## 2024-05-08 MED ORDER — DULOXETINE HCL 30 MG PO CPEP
60.0000 mg | ORAL_CAPSULE | Freq: Every day | ORAL | Status: DC
  Administered 2024-05-09 – 2024-05-11 (×3): 60 mg via ORAL
  Filled 2024-05-08 (×3): qty 2

## 2024-05-08 NOTE — ED Notes (Signed)
 Advised nurse that patient has ready bed

## 2024-05-08 NOTE — ED Notes (Signed)
 Pt friend, Stana Ear given recliner chair with warm blanket.

## 2024-05-08 NOTE — ED Provider Notes (Signed)
 [facility] Provider Note    Event Date/Time   First MD Initiated Contact with Patient 05/08/24 1008     (approximate)   History   No chief complaint on file.   HPI  Deborah Velazquez is a 78 y.o. female  Parkinson's disease, seizure disorder, Lewy body dementia who was evaluated yesterday due to concerns for a fever.  On review of the record from yesterday patient had some increased somnolence and they noted that patient had fever of 101 at home and they noticed a strong urinary smell they were concerned about the possibility of UTI.  Patient's urine was concerning for UTI.  Patient's prior UTIs the recommendation was to do IV dose of Rocephin  and to treat with Augmentin .  However they considered admission but given patient was at baseline they were comfortable doing a dose of IV antibiotics and be discharged on oral antibiotics.  Patient had a blood culture that was positive for gram-negative rods and they recommended the patient come back for IV antibiotics.  They deny any change from today in her symptoms.   Physical Exam   Triage Vital Signs: ED Triage Vitals  Encounter Vitals Group     BP 05/08/24 0948 126/74     Girls Systolic BP Percentile --      Girls Diastolic BP Percentile --      Boys Systolic BP Percentile --      Boys Diastolic BP Percentile --      Pulse Rate 05/08/24 0948 72     Resp 05/08/24 0948 18     Temp 05/08/24 0948 97.7 F (36.5 C)     Temp Source 05/08/24 0948 Oral     SpO2 05/08/24 0948 99 %     Weight 05/08/24 1007 150 lb (68 kg)     Height 05/08/24 1007 5' 5 (1.651 m)     Head Circumference --      Peak Flow --      Pain Score 05/08/24 1007 0     Pain Loc --      Pain Education --      Exclude from Growth Chart --     Most recent vital signs: Vitals:   05/08/24 0948  BP: 126/74  Pulse: 72  Resp: 18  Temp: 97.7 F (36.5 C)  SpO2: 99%     General: Awake, no distress.  CV:  Good peripheral perfusion.   Resp:  Normal effort.  Abd:  No distention.  Soft and nontender Other:     ED Results / Procedures / Treatments   Labs (all labs ordered are listed, but only abnormal results are displayed) Labs Reviewed  COMPREHENSIVE METABOLIC PANEL WITH GFR - Abnormal; Notable for the following components:      Result Value   Potassium 3.4 (*)    Glucose, Bld 129 (*)    Albumin  3.1 (*)    AST 14 (*)    All other components within normal limits  CBC WITH DIFFERENTIAL/PLATELET - Abnormal; Notable for the following components:   Hemoglobin 10.7 (*)    HCT 33.1 (*)    RDW 16.0 (*)    All other components within normal limits  RESP PANEL BY RT-PCR (RSV, FLU A&B, COVID)  RVPGX2  CULTURE, BLOOD (ROUTINE X 2)  CULTURE, BLOOD (ROUTINE X 2)  LACTIC ACID, PLASMA  PROTIME-INR  URINALYSIS, W/ REFLEX TO CULTURE (INFECTION SUSPECTED)  CBC  CREATININE, SERUM      RADIOLOGY I have reviewed the  ct personally and interpreted patient has stones in her kidney but do not see any obvious obstruction but does have some hydro on the left   PROCEDURES:  Critical Care performed: No  .1-3 Lead EKG Interpretation  Performed by: Lubertha Rush, MD Authorized by: Lubertha Rush, MD     Interpretation: normal     ECG rate:  60   ECG rate assessment: normal     Rhythm: sinus rhythm     Ectopy: none     Conduction: normal      MEDICATIONS ORDERED IN ED: Medications  cefTRIAXone  (ROCEPHIN ) 2 g in sodium chloride  0.9 % 100 mL IVPB (has no administration in time range)  heparin  injection 5,000 Units (has no administration in time range)  sodium chloride  0.9 % bolus 1,000 mL (1,000 mLs Intravenous New Bag/Given 05/08/24 1107)     IMPRESSION / MDM / ASSESSMENT AND PLAN / ED COURSE  I reviewed the triage vital signs and the nursing notes.   Patient's presentation is most consistent with acute presentation with potential threat to life or bodily function.   Patient is very well-appearing but did have  positive blood cultures so sent in for evaluation.  Patient was given some fluids although does not meet sepsis criteria.  However given positive blood cultures I will continue the ceftriaxone  get repeat blood cultures I discussed the hospital team for admission.  I did get CT imaging to rule out any type of obstructive kidney stone given she does have significant mount of RBCs this was a negative for obstructing lesion does have some pyelonephritis.  I did discuss incidental findings on CT head and CT abdomen with family.  Her hemoglobin is stable white count normal COVID flu negative.  Lactate normal  Given patient does not meet sepsis criteria and vitals are stable with patient's age I have only given 1 L of fluid.  On repeat evaluation continues to look well  The patient is on the cardiac monitor to evaluate for evidence of arrhythmia and/or significant heart rate changes.      FINAL CLINICAL IMPRESSION(S) / ED DIAGNOSES   Final diagnoses:  Bacteremia  Pyelonephritis     Rx / DC Orders   ED Discharge Orders     None        Note:  This document was prepared using Dragon voice recognition software and may include unintentional dictation errors.   Lubertha Rush, MD 05/08/24 [id number]

## 2024-05-08 NOTE — Progress Notes (Signed)
 CODE SEPSIS - PHARMACY COMMUNICATION  **Broad Spectrum Antibiotics should be administered within 1 hour of Sepsis diagnosis**  Time Code Sepsis Called/Page Received: 1014  Antibiotics Ordered: ceftriaxone  2 gm  Time of 1st antibiotic administration: 6/13 @ 1933  Additional action taken by pharmacy:    If necessary, Name of Provider/Nurse Contacted:      Scotty Cyphers ,PharmD Clinical Pharmacist  05/08/2024  10:46 AM

## 2024-05-08 NOTE — Progress Notes (Signed)
 Elink following for sepsis protocol.

## 2024-05-08 NOTE — ED Notes (Signed)
 Pt discharged home via [facility] at this time.

## 2024-05-08 NOTE — ED Triage Notes (Signed)
 Arrives from home via [facility].  Seen and treated in ED yesterday for UTI. Patient was called back to ED to return for continued antibiotics.  VS wnl.

## 2024-05-08 NOTE — H&P (Signed)
 History and Physical    Patient: Deborah Velazquez GNF:[id number] DOB: 09-12-1946 DOA: 05/08/2024 DOS: the patient was seen and examined on 05/08/2024 PCP: Thersia Flax, MD  Patient coming from: Home  Chief Complaint: Asked to return to ED due to positive blood cultures  HPI: Deborah Velazquez is a 78 y.o. female with medical history significant of Parkinson's disease, seizure disorder, Lewy body dementia, who presents to the emergency department on 6/13 with fever and strong smelling urine.  At that time patient was found to have mild hypokalemia, and urinalysis concerning for UTI.  Blood cultures were obtained during that visit.  Patient was sent with Augmentin .  On 6/14 blood cultures returned positive for gram-negative rods and patient was called to return to the ER. On return to the ED today patient has no leukocytosis, labs notable for very mild hypokalemia of 3.4, hypoalbuminemia 3.1, and no other lab abnormalities.  Blood cultures have resulted positive for Klebsiella pneumonia.  CT renal stone protocol in the ED reveals chronic nephrolithiasis in the right kidney and mild left-sided hydronephrosis with left hydroureter and mild left pararenal and ureteral inflammatory stranding which does not appear significantly different from CT scan in February.  No obstructing calculus identified. Head CT also performed in the ED which reveals moderate to severe chronic small vessel ischemic disease and unchanged ventriculomegaly which may reflect central predominant cerebral atrophy or NPH. Patient currently resides at home with her husband.  She has 24/7 assistance with home health aides and friends and family. Prior to arrival she was experiencing malodorous, dark urine and fever at home. Today patient is disoriented beyond her baseline, but her friend, and caregiver reports that this is likely due to patient not having had lunch yet.  Review of Systems: As mentioned in the history of present illness.  All other systems reviewed and are negative. Past Medical History:  Diagnosis Date   Anxiety    Arthritis    Hypercholesterolemia    Hyperlipemia    Irritable bowel syndrome    Parkinson disease (HCC)    Pneumonia due to human metapneumovirus 03/18/2023   Restless leg syndrome    RSD (reflex sympathetic dystrophy)    pt denies   Stroke [facility])    TIA's   UTI due to Klebsiella species 05/10/2023   Past Surgical History:  Procedure Laterality Date   APPENDECTOMY     BACK SURGERY     BREAST BIOPSY Left 08/16/2013   neg   CATARACT EXTRACTION Left    CATARACT EXTRACTION W/PHACO Right 06/01/2020   Procedure: CATARACT EXTRACTION PHACO AND INTRAOCULAR LENS PLACEMENT (IOC) RIGHT;  Surgeon: Annell Kidney, MD;  Location: [facility] ORS;  Service: Ophthalmology;  Laterality: Right;  cde5.26 us01:05.7 ap8.0   COLONOSCOPY     COLONOSCOPY WITH PROPOFOL  N/A 06/20/2015   Procedure: COLONOSCOPY WITH PROPOFOL ;  Surgeon: Deveron Fly, MD;  Location: [facility] ENDOSCOPY;  Service: Endoscopy;  Laterality: N/A;   EYE SURGERY     Cataract in one eye   FRACTURE SURGERY     wrist - right and right leg. (? if any metal)   HIP ARTHROPLASTY Left 05/08/2023   Procedure: ARTHROPLASTY BIPOLAR HIP (HEMIARTHROPLASTY);  Surgeon: Venus Ginsberg, MD;  Location: [facility] ORS;  Service: Orthopedics;  Laterality: Left;   PARS PLANA VITRECTOMY Left 2010   Social History:  reports that she has never smoked. She has never used smokeless tobacco. She reports current alcohol use. She reports that she does not use drugs.  Allergies  Allergen Reactions   Pseudoephedrine Hcl Anxiety and Other (See Comments)    Nervous   Sudafed [Pseudoephedrine] Anxiety and Other (See Comments)    Nervous    Family History  Problem Relation Age of Onset   Breast cancer Mother 41   Diabetes Mother    Cancer Mother    Heart disease Mother    Alzheimer's disease Father    Heart disease Maternal Aunt     Prior to Admission  medications   Medication Sig Start Date End Date Taking? Authorizing Provider  amoxicillin -clavulanate (AUGMENTIN ) 875-125 MG tablet Take 1 tablet by mouth 2 (two) times daily for 7 days. 05/07/24 05/14/24  Claria Crofts, MD  aspirin  EC 81 MG tablet Take 81 mg by mouth daily. Swallow whole.    [provider]  atorvastatin  (LIPITOR) 10 MG tablet TAKE 1 TABLET(10 MG) BY MOUTH AT BEDTIME 04/20/24   Thersia Flax, MD  carbidopa -levodopa  (SINEMET  CR) 50-200 MG tablet Take 1 tablet by mouth 5 (five) times daily. Patient takes at 0700,1030,1400,1730,2200 12/30/19   [provider]  DULoxetine  (CYMBALTA ) 60 MG capsule Take 60 mg by mouth daily.     [provider]  feeding supplement (ENSURE ENLIVE / ENSURE PLUS) LIQD Take 237 mLs by mouth 2 (two) times daily between meals. 01/15/24   Darus Engels A, DO  lamoTRIgine  (LAMICTAL ) 100 MG tablet Take 1 tablet by mouth 2 (two) times daily. 10/29/23 10/28/24  [provider]  nystatin  (MYCOSTATIN /NYSTOP ) powder Apply 1 application. topically 2 (two) times daily. To rash until resolved. 02/26/22   Thersia Flax, MD  ondansetron  (ZOFRAN -ODT) 4 MG disintegrating tablet Take 1 tablet (4 mg total) by mouth every 8 (eight) hours as needed for nausea or vomiting. 01/15/24   Montey Apa, DO  pregabalin  (LYRICA ) 150 MG capsule Take 150 mg by mouth 2 (two) times daily.  06/10/19   [provider]  QUEtiapine  (SEROQUEL ) 25 MG tablet Take 0.5 tablets (12.5 mg total) by mouth at bedtime. Patient taking differently: Take 12.5 mg by mouth 2 (two) times daily. 12.5mg  in Am and 25mg  PM 03/20/23   Verla Glaze, MD  rivastigmine  (EXELON ) 1.5 MG capsule Take 1.5 mg by mouth 2 (two) times daily.    [provider]  sertraline  (ZOLOFT ) 100 MG tablet Take 200 mg by mouth daily.     [provider]    Physical Exam: Vitals:   05/08/24 0948 05/08/24 1007 05/08/24 1200  BP: 126/74  139/74  Pulse: 72  63  Resp: 18     Temp: 97.7 F (36.5 C)    TempSrc: Oral    SpO2: 99%  100%  Weight:  68 kg   Height:  5' 5 (1.651 m)    Constitutional:  Normal appearance. Non toxic-appearing.  HENT: Head Normocephalic and atraumatic.  Mucous membranes are moist.  Eyes:  Extraocular intact. Conjunctivae normal. Pupils are equal, round, and reactive to light.  Cardiovascular: Rate and Rhythm: Normal rate and regular rhythm.  Pulmonary: Non labored, symmetric rise of chest wall.  Musculoskeletal:  Normal range of motion.  Skin: warm and dry. not jaundiced.  Neurological: Intermittently confused, oriented to self.  Knows most commands, requires redirection. Psychiatric: Mood and Affect congruent.   Data Reviewed:     Latest Ref Rng & Units 05/08/2024   10:24 AM 05/07/2024    4:18 PM 01/14/2024    6:01 AM  CMP  Glucose 70 - 99 mg/dL 829  562  90  BUN 8 - 23 mg/dL 8  11  7    Creatinine 0.44 - 1.00 mg/dL 4.09  8.11  9.14   Sodium 135 - 145 mmol/L 140  137  141   Potassium 3.5 - 5.1 mmol/L 3.4  3.2  3.9   Chloride 98 - 111 mmol/L 107  103  110   CO2 22 - 32 mmol/L 23  25  23    Calcium  8.9 - 10.3 mg/dL 8.9  8.2  9.0   Total Protein 6.5 - 8.1 g/dL 7.2  6.5    Total Bilirubin 0.0 - 1.2 mg/dL 0.8  0.5    Alkaline Phos 38 - 126 U/L 64  60    AST 15 - 41 U/L 14  18    ALT 0 - 44 U/L <5  7        Latest Ref Rng & Units 05/08/2024   10:24 AM 05/07/2024    4:18 PM 01/14/2024    6:01 AM  CBC  WBC 4.0 - 10.5 K/uL 8.7  9.5  7.2   Hemoglobin 12.0 - 15.0 g/dL 78.2  95.6  21.3   Hematocrit 36.0 - 46.0 % 33.1  32.3  30.8   Platelets 150 - 400 K/uL 265  264  190    All pertinent imaging reviewed   Assessment and Plan: Klebsiella bacteremia - Secondary to UTI, currently growing same - On ceftriaxone , will continue - Patient has been on Augmentin  at home since discharge yesterday - Repeat blood cultures taken in ED  Primary Parkinson's disease, advanced.  Has visual hallucinations - Resume home meds - At baseline  patient requires 24/7 assistance, mostly bedbound, requires [facility] lift.  Caregiver reports her mental status waxes and wanes. - Patient's HCPOA endorses concern with increasing coughing when eating recently. Will obtain SLP eval. Ordered aspiration precautions. - Will obtain PT/OT/SLP evals. - Delirium precautions  Generalized debility - Patient is wheelchair-bound at baseline - 24/7 home health aide assistance. - Will resume home health services at discharge  Seizure disorder - Continue lamotrigine   Hypokalemia - Replace as needed   Advance Care Planning: Patient's healthcare power of attorney, Deborah Velazquez at bedside and present for discussion.  She reports that the patient would not desire resuscitation or intubation in the event of cardiac death.  She would like to make the patient DNR/DNI.  Consults: N/A  Family Communication: Patient's HCPOA Deborah Velazquez at bedside and present for all discussions  Severity of Illness: The appropriate patient status for this patient is INPATIENT. Inpatient status is judged to be reasonable and necessary in order to provide the required intensity of service to ensure the patient's safety. The patient's presenting symptoms, physical exam findings, and initial radiographic and laboratory data in the context of their chronic comorbidities is felt to place them at high risk for further clinical deterioration. Furthermore, it is not anticipated that the patient will be medically stable for discharge from the hospital within 2 midnights of admission.   * I certify that at the point of admission it is my clinical judgment that the patient will require inpatient hospital care spanning beyond 2 midnights from the point of admission due to high intensity of service, high risk for further deterioration and high frequency of surveillance required.*  Author: Sunset Joshi, DO 05/08/2024 1:27 PM  For on call review [email or web address].

## 2024-05-08 NOTE — ED Notes (Signed)
 Patient transferred to CT

## 2024-05-08 NOTE — Telephone Encounter (Signed)
 This RN received call from lab regarding + blood cultures, gram - rods, enterobacteralis pneumoniae with no resistance. Pt's care discuss with Dr. Alejo Amsler, per Dr. Alejo Amsler pt needs to return to ER for further workup/abx. This RN called and spoke with patient's POA regarding abnormal bloodwork and requested that patient return ASAP, pt's legal guardian states will send patient back by EMS.

## 2024-05-08 NOTE — Plan of Care (Signed)
   Problem: Education: Goal: Knowledge of General Education information will improve Description Including pain rating scale, medication(s)/side effects and non-pharmacologic comfort measures Outcome: Progressing

## 2024-05-08 NOTE — ED Triage Notes (Signed)
 Patient arrived by [facility] from home after being called back for blood culture growth.   Since Friday patient has been wearing 2L continuous.  Patient is bedbound. History parkinson  Disoriented to time. Alert to self and place

## 2024-05-09 DIAGNOSIS — R7881 Bacteremia: Secondary | ICD-10-CM | POA: Diagnosis not present

## 2024-05-09 DIAGNOSIS — N12 Tubulo-interstitial nephritis, not specified as acute or chronic: Secondary | ICD-10-CM | POA: Diagnosis not present

## 2024-05-09 HISTORY — DX: Tubulo-interstitial nephritis, not specified as acute or chronic: N12

## 2024-05-09 LAB — CBC WITH DIFFERENTIAL/PLATELET
Abs Immature Granulocytes: 0.02 10*3/uL (ref 0.00–0.07)
Basophils Absolute: 0 10*3/uL (ref 0.0–0.1)
Basophils Relative: 1 %
Eosinophils Absolute: 0.1 10*3/uL (ref 0.0–0.5)
Eosinophils Relative: 1 %
HCT: 32.4 % — ABNORMAL LOW (ref 36.0–46.0)
Hemoglobin: 10.3 g/dL — ABNORMAL LOW (ref 12.0–15.0)
Immature Granulocytes: 0 %
Lymphocytes Relative: 20 %
Lymphs Abs: 1.5 10*3/uL (ref 0.7–4.0)
MCH: 26.8 pg (ref 26.0–34.0)
MCHC: 31.8 g/dL (ref 30.0–36.0)
MCV: 84.2 fL (ref 80.0–100.0)
Monocytes Absolute: 0.8 10*3/uL (ref 0.1–1.0)
Monocytes Relative: 10 %
Neutro Abs: 5.1 10*3/uL (ref 1.7–7.7)
Neutrophils Relative %: 68 %
Platelets: 280 10*3/uL (ref 150–400)
RBC: 3.85 MIL/uL — ABNORMAL LOW (ref 3.87–5.11)
RDW: 15.9 % — ABNORMAL HIGH (ref 11.5–15.5)
WBC: 7.5 10*3/uL (ref 4.0–10.5)
nRBC: 0 % (ref 0.0–0.2)

## 2024-05-09 LAB — URINE CULTURE: Culture: >=100000 — AB

## 2024-05-09 LAB — COMPREHENSIVE METABOLIC PANEL WITH GFR
ALT: 7 U/L (ref 0–44)
AST: 17 U/L (ref 15–41)
Albumin: 2.9 g/dL — ABNORMAL LOW (ref 3.5–5.0)
Alkaline Phosphatase: 56 U/L (ref 38–126)
Anion gap: 10 (ref 5–15)
BUN: 5 mg/dL — ABNORMAL LOW (ref 8–23)
CO2: 23 mmol/L (ref 22–32)
Calcium: 8.7 mg/dL — ABNORMAL LOW (ref 8.9–10.3)
Chloride: 107 mmol/L (ref 98–111)
Creatinine, Ser: 0.57 mg/dL (ref 0.44–1.00)
GFR, Estimated: >60 mL/min (ref >60)
Glucose, Bld: 114 mg/dL — ABNORMAL HIGH (ref 70–99)
Potassium: 3.3 mmol/L — ABNORMAL LOW (ref 3.5–5.1)
Sodium: 140 mmol/L (ref 135–145)
Total Bilirubin: 0.8 mg/dL (ref 0.0–1.2)
Total Protein: 6.5 g/dL (ref 6.5–8.1)

## 2024-05-09 LAB — MAGNESIUM: Magnesium: 2 mg/dL (ref 1.7–2.4)

## 2024-05-09 LAB — PHOSPHORUS: Phosphorus: 2.8 mg/dL (ref 2.5–4.6)

## 2024-05-09 MED ORDER — HALOPERIDOL LACTATE 5 MG/ML IJ SOLN
1.0000 mg | Freq: Once | INTRAMUSCULAR | Status: AC
  Administered 2024-05-09: 1 mg via INTRAVENOUS
  Filled 2024-05-09: qty 1

## 2024-05-09 MED ORDER — POTASSIUM CHLORIDE CRYS ER 20 MEQ PO TBCR
40.0000 meq | EXTENDED_RELEASE_TABLET | Freq: Once | ORAL | Status: AC
  Administered 2024-05-09: 40 meq via ORAL
  Filled 2024-05-09: qty 2

## 2024-05-09 NOTE — Evaluation (Signed)
 Clinical/Bedside Swallow Evaluation Patient Details  Name: Deborah Velazquez MRN: [id number] Date of Birth: 26-Jan-1946  Today's Date: 05/09/2024 Time: SLP Start Time (ACUTE ONLY): 1115 SLP Stop Time (ACUTE ONLY): 1215 SLP Time Calculation (min) (ACUTE ONLY): 60 min  Past Medical History:  Past Medical History:  Diagnosis Date   Anxiety    Arthritis    Hypercholesterolemia    Hyperlipemia    Irritable bowel syndrome    Parkinson disease (HCC)    Pneumonia due to human metapneumovirus 03/18/2023   Restless leg syndrome    RSD (reflex sympathetic dystrophy)    pt denies   Stroke [facility])    TIA's   UTI due to Klebsiella species 05/10/2023   Past Surgical History:  Past Surgical History:  Procedure Laterality Date   APPENDECTOMY     BACK SURGERY     BREAST BIOPSY Left 08/16/2013   neg   CATARACT EXTRACTION Left    CATARACT EXTRACTION W/PHACO Right 06/01/2020   Procedure: CATARACT EXTRACTION PHACO AND INTRAOCULAR LENS PLACEMENT (IOC) RIGHT;  Surgeon: Annell Kidney, MD;  Location: [facility] ORS;  Service: Ophthalmology;  Laterality: Right;  cde5.26 us01:05.7 ap8.0   COLONOSCOPY     COLONOSCOPY WITH PROPOFOL  N/A 06/20/2015   Procedure: COLONOSCOPY WITH PROPOFOL ;  Surgeon: Deveron Fly, MD;  Location: [facility] ENDOSCOPY;  Service: Endoscopy;  Laterality: N/A;   EYE SURGERY     Cataract in one eye   FRACTURE SURGERY     wrist - right and right leg. (? if any metal)   HIP ARTHROPLASTY Left 05/08/2023   Procedure: ARTHROPLASTY BIPOLAR HIP (HEMIARTHROPLASTY);  Surgeon: Venus Ginsberg, MD;  Location: [facility] ORS;  Service: Orthopedics;  Laterality: Left;   PARS PLANA VITRECTOMY Left 2010   HPI:  Pt is a 78 y.o. female with medical history significant of Parkinson's disease, seizure disorder, Lewy body Dementia, MDD, who presents to the emergency department on 6/13-14 with fever and strong smelling urine.  At that time patient was found to have mild hypokalemia, and urinalysis concerning  for UTI.  Blood cultures were obtained during that visit.  Patient was sent with Augmentin .  On 6/14 blood cultures returned positive for gram-negative rods and patient was called to return to the ER.  On return to the ED today patient has no leukocytosis, labs notable for very mild hypokalemia of 3.4, hypoalbuminemia 3.1, and no other lab abnormalities.  Blood cultures have resulted positive for Klebsiella pneumonia.  CT renal stone protocol in the ED reveals chronic nephrolithiasis in the right kidney and mild left-sided hydronephrosis with left hydroureter and mild left pararenal and ureteral inflammatory stranding which does not appear significantly different from CT scan in February.  No obstructing calculus identified.  Head CT also performed in the ED which reveals moderate to severe chronic small vessel ischemic disease and unchanged ventriculomegaly which may reflect central predominant cerebral atrophy or NPH.  Patient currently resides at home with her husband.  She has 24/7, 100% assistance with home health aides and friends and family.  Prior to arrival, she was experiencing malodorous, dark urine and fever at home.  Per chart notes and family report, on bad days she would stay in bed and on her better cognitive days, she was able to walk using a RW with x2 assist ~40-50 ft. Assist for all ADLs..    Assessment / Plan / Recommendation  Clinical Impression   Pt seen for BSE. Pt awake w/ eyes opened; Nonverbal the entire session. Per Caregiver/friend present, pt's  verbal communication and overall engagement is variable during a day. Pt required FULL feeding support this session. Noted slight lean to side which was corrected w/ positioning/pillow support- this was noted at prior BSE. Pt appeared stiff/rigid. On [place] O2 support of 2L; afebrile. WBC WNL.  Pt appears to present w/ Similar swallowing presentation as in 02/2023 per chart notes.  Pt appears to present w/ grossly adequate oropharyngeal  phase swallow w/ No overt pharyngeal phase dysphagia noted during po trials. No sensorimotor deficits significantly impacting swallowing during the evaluation. Pt consumed po trials w/ No overt, clinical s/s of aspiration during the po trials. Pt exhibited decreased Oral Prep awareness requiring cues and tactile stim to lips for bolus awareness/acceptance.  Pt appears at increased risk for aspiration but this risk is reduced when following general aspiration precautions and providing feeding support/Supervision in setting of Dementia. Pt has significantly challenging factors that could impact her oropharyngeal swallowing to include LB Dementia, Parkinson's Dis., fatigue/weakness, and need for feeding support at meals. These factors can increase risk for dysphagia as well as decreased oral intake overall.   During po trials, pt consumed all consistencies w/ no overt coughing, decline in vocal quality, or change in respiratory presentation during/post trials. O2 sats 97% when checked. Oral phase appeared grossly [facility] w/ timely bolus management, mastication, and control of bolus propulsion for A-P transfer for swallowing. Oral clearing achieved w/ all trial consistencies -- moistened, soft foods given. Noted min increased lingual searching/sweeping movements b/t trials. Oral Prep deficits noted. OM Exam was cursory but appeared [facility] w/ no unilateral weakness noted during bolus management. Full feeding support required.   Recommend a more Mech Soft/regular consistency diet w/ well-Cut/Chopped meats and foods, moistened foods; Thin liquids -- carefully monitor straw use, and pt should help to Hold Cup when drinking. Recommend general aspiration precautions including Small sips/bites, Time b/t trials for oral clearing. Upright, midline sitting for po intake. FULL feeding support/Supervision. Pills CRUSHED in Puree for safer, easier swallowing -- pt has done this in the past, and it was encouraged now and for D/C to the  caregiver, friend.  Education given on Pills in Puree; food consistencies and easy to eat options; general aspiration precautions and feeding support to pt and caregiver, friend.  MD/NSG to reconsult if any new needs arise. No further acute ST services indicated. NSG updated, agreed. MD updated. Recommend Dietician f/u for support. Precautions posted in room, chart.  SLP Visit Diagnosis: Dysphagia, oropharyngeal phase (R13.12) (LB Dementia; Parkinson's Dis; decreased oral prep awareness requiring tactile stim at the lips; full feeding support)    Aspiration Risk  Mild aspiration risk;Risk for inadequate nutrition/hydration (reduced w/ 100% Supervision/support)    Diet Recommendation   Thin;Dysphagia 3 (mechanical soft) (moistened foods cut small) = a more Mech Soft/regular consistency diet w/ well-Cut/Chopped meats and foods, moistened foods; Thin liquids -- carefully monitor straw use, and pt should help to Hold Cup when drinking. Recommend general aspiration precautions including Small sips/bites, Time b/t trials for oral clearing. Upright, midline sitting for po intake. FULL feeding support/Supervision.  Medication Administration: Crushed with puree    Other  Recommendations Recommended Consults:  (Dietician; Palliative Care f/u) Oral Care Recommendations: Oral care BID;Oral care before and after PO;Staff/trained caregiver to provide oral care     Assistance Recommended at Discharge  FULL  Functional Status Assessment Patient has had a recent decline in their functional status and/or demonstrates limited ability to make significant improvements in function in a reasonable and predictable  amount of time  Frequency and Duration  (n/a)   (n/a)       Prognosis Prognosis for improved oropharyngeal function: Fair Barriers to Reach Goals: Cognitive deficits;Language deficits;Time post onset;Severity of deficits;Behavior Barriers/Prognosis Comment: LB Dementia; Parkinson's Dis; decreased oral  prep awareness requiring tactile stim at the lips; full feeding support      Swallow Study   General Date of Onset: 05/08/24 HPI: Pt is a 78 y.o. female with medical history significant of Parkinson's disease, seizure disorder, Lewy body Dementia, MDD, who presents to the emergency department on 6/13-14 with fever and strong smelling urine.  At that time patient was found to have mild hypokalemia, and urinalysis concerning for UTI.  Blood cultures were obtained during that visit.  Patient was sent with Augmentin .  On 6/14 blood cultures returned positive for gram-negative rods and patient was called to return to the ER.  On return to the ED today patient has no leukocytosis, labs notable for very mild hypokalemia of 3.4, hypoalbuminemia 3.1, and no other lab abnormalities.  Blood cultures have resulted positive for Klebsiella pneumonia.  CT renal stone protocol in the ED reveals chronic nephrolithiasis in the right kidney and mild left-sided hydronephrosis with left hydroureter and mild left pararenal and ureteral inflammatory stranding which does not appear significantly different from CT scan in February.  No obstructing calculus identified.  Head CT also performed in the ED which reveals moderate to severe chronic small vessel ischemic disease and unchanged ventriculomegaly which may reflect central predominant cerebral atrophy or NPH.  Patient currently resides at home with her husband.  She has 24/7, 100% assistance with home health aides and friends and family.  Prior to arrival, she was experiencing malodorous, dark urine and fever at home.  Per chart notes and family report, on bad days she would stay in bed and on her better cognitive days, she was able to walk using a RW with x2 assist ~40-50 ft. Assist for all ADLs.. Type of Study: Bedside Swallow Evaluation Previous Swallow Assessment: 02/2023- mech soft diet w/ thins then Diet Prior to this Study: Regular;Thin liquids (Level 0) Temperature  Spikes Noted: No (wbc 7.5) Respiratory Status: Nasal cannula (2L) History of Recent Intubation: No Behavior/Cognition: Alert;Cooperative;Pleasant mood;Confused;Distractible;Requires cueing;Doesn't follow directions (nonverbal) Oral Cavity Assessment:  (unable to assess d/t participation) Oral Care Completed by SLP: Recent completion by staff Oral Cavity - Dentition: Adequate natural dentition (per caregiver) Vision:  (unknown) Self-Feeding Abilities: Total assist Patient Positioning: Upright in bed (total assist) Baseline Vocal Quality:  (nonverbal) Volitional Cough: Cognitively unable to elicit Volitional Swallow: Unable to elicit    Oral/Motor/Sensory Function Overall Oral Motor/Sensory Function:  (no unilateral weakness noted during bolus management, clearing)   Ice Chips Ice chips: Within functional limits Presentation: Spoon (fed; 2 trials) Other Comments: decreased oral prep awareness requiring tactile stim at the lips   Thin Liquid Thin Liquid: Within functional limits Presentation: Straw (fed; ~4 ozs total) Other Comments: decreased oral prep awareness requiring tactile stim at the lips; straw use is Baseline    Nectar Thick Nectar Thick Liquid: Not tested   Honey Thick Honey Thick Liquid: Not tested   Puree Puree: Within functional limits Presentation: Spoon (fed; 9 trials) Other Comments: decreased oral prep awareness requiring tactile stim at the lips   Solid     Solid: Within functional limits (grossly) Presentation: Spoon (fed; 8 trials) Other Comments: decreased oral prep awareness requiring tactile stim at the lips        Dana Duncan  Azalia Leo, MS, CCC-SLP Speech Language Pathologist Rehab Services; [facility] [place] [phone] (ascom) Cecilio Ohlrich 05/09/2024,3:59 PM

## 2024-05-09 NOTE — Evaluation (Signed)
 Physical Therapy Evaluation Patient Details Name: Deborah Velazquez MRN: [id number] DOB: [birth year] Today's Date: 05/09/2024  History of Present Illness  78 year old female patient who presents to the ED with fever and strong smelling urine. Admitted for management of Klebsiella bacteremia 2/2 UTI. PMH of Parkinson's disease on carbidopa  and levodopa , seizure disorder, Lewy body dementia  Clinical Impression  Pt seen for PT/OT co-evaluation to maximize PT/OT safety and pt safety. Prior to recent medical concerns, per caregiver report, pt receives 24/7 assistance with caregivers/family support for ADLs and for overall functional mobility, per caregiver report pt ambulates ~50 ft with 2+ assist and RW; lives in a 1 level home with a ramped entrance and currently has all necessary AD to complete tasks.  Currently pt requires 2+ total physical assist for bed mobility, 1+ mod/max assistance for sitting balance EOB, 2x min/mod assistance for STS from elevated bed height and can maintain steady static balance for ~2 minutes before requiring a seated break. PT/OT made aware of fluctuations in ability to perform tasks from caregiver, due to inability to ambulate today. Nonetheless, pt shows limitations in strength, balance, ROM and decreased activity tolerance which limit her ability to perform ADLs. Pt would currently benefit from skilled PT to address noted impairments and functional limitations (see below for any additional details).  Upon hospital discharge, pt would benefit from ongoing therapy to return to PLOF.       If plan is discharge home, recommend the following: Two people to help with walking and/or transfers;Two people to help with bathing/dressing/bathroom;Assist for transportation;Direct supervision/assist for medications management;Assistance with cooking/housework;Supervision due to cognitive status;Help with stairs or ramp for entrance;Direct supervision/assist for financial management   Can  travel by private vehicle   No     Equipment Recommendations None recommended by PT  Recommendations for Other Services       Functional Status Assessment Patient has had a recent decline in their functional status and demonstrates the ability to make significant improvements in function in a reasonable and predictable amount of time.     Precautions / Restrictions Precautions Precautions: Fall Recall of Precautions/Restrictions: Impaired Restrictions Weight Bearing Restrictions Per Provider Order: No      Mobility  Bed Mobility Overal bed mobility: Needs Assistance Bed Mobility: Supine to Sit     Supine to sit: +2 for physical assistance, Total assist Sit to supine: Total assist, +2 for physical assistance   General bed mobility comments: 2+ physical assist needed for leg management and truncal support    Transfers Overall transfer level: Needs assistance Equipment used: Rolling walker (2 wheels) Transfers: Sit to/from Stand Sit to Stand: +2 safety/equipment, Min assist, Mod assist, bed elevated           General transfer comment: vc's for UE and LE placement/positioning and for transfer sequencing. Was able to glide BLE towards hospital bed frame with 2+ mod a for weightshifiting    Ambulation/Gait               General Gait Details: Defer to subsequent sessions due to inability to transfer weight adequately and impaired balance.  Stairs            Wheelchair Mobility     Tilt Bed    Modified Rankin (Stroke Patients Only)       Balance Overall balance assessment: Needs assistance Sitting-balance support: Bilateral upper extremity supported, Feet supported Sitting balance-Leahy Scale: Fair Sitting balance - Comments: Vc's and tactile cues provided with no change in positioning, 1x  mod/max assist provided to correct truncal lean and bring pt within BOS. Postural control: Right lateral lean, Posterior lean Standing balance support:  Bilateral upper extremity supported, Reliant on assistive device for balance Standing balance-Leahy Scale: Fair Standing balance comment: 2x mod assist for steady static standing, was able to maintain position for ~ 2 minutes before requesting a seat.                             Pertinent Vitals/Pain Pain Assessment Pain Assessment: PAINAD Breathing: normal Negative Vocalization: none Facial Expression: smiling or inexpressive Body Language: relaxed Consolability: no need to console PAINAD Score: 0 Pain Intervention(s): Monitored during session    Home Living Family/patient expects to be discharged to:: Private residence Living Arrangements: Spouse/significant other;Children;Non-relatives/Friends Available Help at Discharge: Family;Personal care attendant;Available 24 hours/day Type of Home: House Home Access: Ramped entrance       Home Layout: One level Home Equipment: Agricultural consultant (2 wheels);Rollator (4 wheels);BSC/3in1;Wheelchair - manual;Shower seat;Hospital bed;Hand held shower head;Grab bars - tub/shower;Other (comment) Alen Amy lift)      Prior Function Prior Level of Function : Needs assist  Cognitive Assist : ADLs (cognitive);Mobility (cognitive) Mobility (Cognitive): Set up cues ADLs (Cognitive): Intermittent cues Physical Assist : Mobility (physical);ADLs (physical) Mobility (physical): Bed mobility;Transfers;Gait ADLs (physical): Toileting;Dressing;Bathing Mobility Comments: Has to ability to intermittently walk ~ 32ft with RW and 2+ assist for increased safety, dependent on good/bad day ADLs Comments: bed level ADL performance with cueing and assist; varies day to day if she can ambulate to bathroom     Extremity/Trunk Assessment   Upper Extremity Assessment Upper Extremity Assessment: Generalized weakness    Lower Extremity Assessment Lower Extremity Assessment: Generalized weakness    Cervical / Trunk Assessment Cervical / Trunk  Assessment: Kyphotic  Communication   Communication Communication: Impaired Factors Affecting Communication: Difficulty expressing self    Cognition Arousal: Alert Behavior During Therapy: Flat affect (Baseline behavior)   PT - Cognitive impairments: History of cognitive impairments                       PT - Cognition Comments: Constant caregiver support to assist with questions Following commands: Impaired Following commands impaired: Follows one step commands inconsistently     Cueing Cueing Techniques: Verbal cues, Gestural cues, Tactile cues, Visual cues     General Comments General comments (skin integrity, edema, etc.): Pre session: HR 78 bpm, SpO2: 96+% on room air, Post bed mobility: HR 80 bpm, SpO2: 98+ % on room air, Post session:HR 80 bpm, SpO2:95+% on room air. Increased plantar flexion contracture noted on LLE with inabilty to bring to neutral passively, LLE IR with laying supine HOB elevated    Exercises     Assessment/Plan    PT Assessment Patient needs continued PT services  PT Problem List Decreased strength;Decreased balance;Decreased cognition;Decreased knowledge of precautions;Decreased mobility;Decreased range of motion;Decreased activity tolerance;Decreased coordination;Decreased safety awareness       PT Treatment Interventions DME instruction;Gait training;Functional mobility training;Therapeutic activities;Therapeutic exercise;Balance training;Patient/family education    PT Goals (Current goals can be found in the Care Plan section)  Acute Rehab PT Goals Patient Stated Goal: Get home PT Goal Formulation: With patient/family Time For Goal Achievement: 05/23/24 Potential to Achieve Goals: Good    Frequency Min 1X/week     Co-evaluation PT/OT/SLP Co-Evaluation/Treatment: Yes Reason for Co-Treatment: Necessary to address cognition/behavior during functional activity;For patient/therapist safety;To address functional/ADL transfers PT  goals addressed during  session: Mobility/safety with mobility;Balance OT goals addressed during session: ADL's and self-care;Proper use of Adaptive equipment and DME       AM-PAC PT 6 Clicks Mobility  Outcome Measure Help needed turning from your back to your side while in a flat bed without using bedrails?: Total Help needed moving from lying on your back to sitting on the side of a flat bed without using bedrails?: Total Help needed moving to and from a bed to a chair (including a wheelchair)?: A Lot Help needed standing up from a chair using your arms (e.g., wheelchair or bedside chair)?: A Lot Help needed to walk in hospital room?: A Lot Help needed climbing 3-5 steps with a railing? : Total 6 Click Score: 9    End of Session Equipment Utilized During Treatment: Gait belt Activity Tolerance: Patient tolerated treatment well;Patient limited by fatigue (limited by cognition) Patient left: in bed;with call [place]/phone within reach;with family/visitor present;with bed alarm set Nurse Communication: Mobility status PT Visit Diagnosis: Unsteadiness on feet (R26.81);Muscle weakness (generalized) (M62.81);Other symptoms and signs involving the nervous system (R29.898)    Time: 1610-9604 PT Time Calculation (min) (ACUTE ONLY): 29 min   Charges:               Chane Cowden, SPT 05/09/24, 4:41 PM

## 2024-05-09 NOTE — Evaluation (Addendum)
 Occupational Therapy Evaluation Patient Details Name: Deborah Velazquez MRN: [id number] DOB: 14-Apr-1946 Today's Date: 05/09/2024   History of Present Illness   78 year old female patient who presents to the ED with fever and strong smelling urine. Admitted for management of Klebsiella bacteremia 2/2 UTI. PMH of Parkinson's disease on carbidopa  and levodopa , seizure disorder, Lewy body dementia     Clinical Impressions Pt was seen for PT/OT co-evaluation this date to maximize pt/therapist safety. PTA,  pt was living at home with 24/7 caregivers. They report she had good vs bad days. On bad days she would stay in bed and on her better cognitive days, she was able to walk using a RW with x2 assist ~40-50 ft. Assist for all ADLs. She was receiving HHPT 1x/wk.    Pt presents to acute OT demonstrating impaired ADL performance and functional mobility 2/2 weakness and further cognitive deficits from UTI. Pt currently requires total A x2 for all bed mobility as PCG reports this is a bad day for her. Pt with R posterio-lateral lean while seated at EOB requiring constant Mod A X1 to maintain. Able to perform facewashing with Min A via cues and set up of washcloth in her hand after demo. Performed STS from EOB to RW with Mod A x2 from elevated bed height with cueing for hand/feet placement and sequencing, cues for upright posture. Able to slide feet back towards bed with weight shifting assist, but unable to take lateral steps. Returned to supine and repositioned with Max/total A x2.  Pt would benefit from skilled OT services to address noted impairments and functional limitations to maximize safety and independence while minimizing falls risk and caregiver burden. Do anticipate the need for follow up OT services upon acute hospital DC-caregivers would like to resume HH therapies.      If plan is discharge home, recommend the following:   A lot of help with bathing/dressing/bathroom;Two people to help with  walking and/or transfers     Functional Status Assessment   Patient has had a recent decline in their functional status and demonstrates the ability to make significant improvements in function in a reasonable and predictable amount of time.     Equipment Recommendations   None recommended by OT     Recommendations for Other Services         Precautions/Restrictions   Precautions Precautions: Fall Recall of Precautions/Restrictions: Impaired Restrictions Weight Bearing Restrictions Per Provider Order: No     Mobility Bed Mobility Overal bed mobility: Needs Assistance Bed Mobility: Supine to Sit, Sit to Supine     Supine to sit: +2 for physical assistance, Total assist, HOB elevated Sit to supine: Total assist, +2 for physical assistance   General bed mobility comments: per caregiver, pt not having a good day and less able to process/sequence tasks; total assist for bed mobility this date    Transfers Overall transfer level: Needs assistance Equipment used: Rolling walker (2 wheels) Transfers: Sit to/from Stand Sit to Stand: +2 safety/equipment, Min assist, Mod assist           General transfer comment: cueing for hand/feet placement and sequencing; mod A x2 for weight shifting to move feet backwards via sliding; unable to process lateral steps to [facility]      Balance Overall balance assessment: Needs assistance Sitting-balance support: Bilateral upper extremity supported, Feet supported Sitting balance-Leahy Scale: Poor Sitting balance - Comments: mod A for seated balance and R posteriolateral lean noted throughout Postural control: Right lateral lean, Posterior lean  Standing balance support: Bilateral upper extremity supported, Reliant on assistive device for balance Standing balance-Leahy Scale: Poor Standing balance comment: mod A x2 for standing balance                           ADL either performed or assessed with clinical judgement    ADL Overall ADL's : At baseline                                       General ADL Comments: facewashing with cueing and setup assist at [facility]     Vision         Perception         Praxis         Pertinent Vitals/Pain Pain Assessment Pain Assessment: PAINAD Breathing: normal Negative Vocalization: none Facial Expression: smiling or inexpressive Body Language: relaxed Consolability: no need to console PAINAD Score: 0 Pain Intervention(s): Monitored during session     Extremity/Trunk Assessment Upper Extremity Assessment Upper Extremity Assessment: Generalized weakness   Lower Extremity Assessment Lower Extremity Assessment: Generalized weakness   Cervical / Trunk Assessment Cervical / Trunk Assessment: Kyphotic   Communication Communication Communication: Impaired Factors Affecting Communication: Difficulty expressing self   Cognition Arousal: Alert Behavior During Therapy: Flat affect (Baseline behavior)                                 Following commands: Impaired Following commands impaired: Follows one step commands inconsistently     Cueing  General Comments   Cueing Techniques: Verbal cues;Gestural cues;Tactile cues;Visual cues  VSS   Exercises Other Exercises Other Exercises: Edu pt/caregiver on role of OT in acute setting   Shoulder Instructions      Home Living Family/patient expects to be discharged to:: Private residence Living Arrangements: Spouse/significant other;Children;Non-relatives/Friends Available Help at Discharge: Family;Personal care attendant;Available 24 hours/day Type of Home: House Home Access: Ramped entrance     Home Layout: One level     Bathroom Shower/Tub: Producer, television/film/video: Handicapped height Bathroom Accessibility: Yes   Home Equipment: Agricultural consultant (2 wheels);Rollator (4 wheels);BSC/3in1;Wheelchair - manual;Shower seat;Hospital bed;Hand held shower head;Grab  bars - tub/shower;Other (comment) ([place] lift)          Prior Functioning/Environment Prior Level of Function : Needs assist  Cognitive Assist : ADLs (cognitive);Mobility (cognitive) Mobility (Cognitive): Set up cues ADLs (Cognitive): Intermittent cues Physical Assist : Mobility (physical);ADLs (physical) Mobility (physical): Bed mobility;Transfers;Gait ADLs (physical): Toileting;Dressing;Bathing Mobility Comments: Has to ability to intermittently walk ~ 68ft with RW and 2+ assist for increased safety, dependent on good/bad day ADLs Comments: bed level ADL performance with cueing and assist; varies day to day if she can ambulate to bathroom    OT Problem List: Decreased cognition;Decreased strength;Impaired balance (sitting and/or standing);Decreased activity tolerance   OT Treatment/Interventions: Self-care/ADL training;Therapeutic exercise;Therapeutic activities;Patient/family education;Balance training      OT Goals(Current goals can be found in the care plan section)   Acute Rehab OT Goals Patient Stated Goal: return home OT Goal Formulation:  (with caregiver) Time For Goal Achievement: 05/23/24 Potential to Achieve Goals: Fair ADL Goals Pt Will Perform Grooming: with supervision;sitting;with contact guard assist Pt Will Perform Upper Body Bathing: with min assist;sitting   OT Frequency:  Min 1X/week    Co-evaluation   Reason for  Co-Treatment: Necessary to address cognition/behavior during functional activity;For patient/therapist safety;To address functional/ADL transfers PT goals addressed during session: Mobility/safety with mobility;Balance OT goals addressed during session: ADL's and self-care;Proper use of Adaptive equipment and DME      AM-PAC OT 6 Clicks Daily Activity     Outcome Measure Help from another person eating meals?: A Little Help from another person taking care of personal grooming?: A Little Help from another person toileting, which  includes using toliet, bedpan, or urinal?: A Lot Help from another person bathing (including washing, rinsing, drying)?: A Lot Help from another person to put on and taking off regular upper body clothing?: A Lot Help from another person to put on and taking off regular lower body clothing?: A Lot 6 Click Score: 14   End of Session Equipment Utilized During Treatment: Rolling walker (2 wheels);Gait belt;Oxygen  Nurse Communication: Mobility status  Activity Tolerance: Patient tolerated treatment well Patient left: in bed;with call [place]/phone within reach;with bed alarm set;with family/visitor present  OT Visit Diagnosis: Other abnormalities of gait and mobility (R26.89);Muscle weakness (generalized) (M62.81)                Time: 0981-1914 OT Time Calculation (min): 29 min Charges:  OT General Charges $OT Visit: 1 Visit OT Evaluation $OT Eval Moderate Complexity: 1 Mod Anadelia Kintz, OTR/L  05/09/24, 1:20 PM  Hadeel Hillebrand E Long [place]image 05/09/2024, 1:13 PM

## 2024-05-09 NOTE — Plan of Care (Signed)
   Problem: Education: Goal: Knowledge of General Education information will improve Description Including pain rating scale, medication(s)/side effects and non-pharmacologic comfort measures Outcome: Progressing   Problem: Health Behavior/Discharge Planning: Goal: Ability to manage health-related needs will improve Outcome: Progressing

## 2024-05-09 NOTE — Progress Notes (Signed)
 PROGRESS NOTE    Deborah Velazquez  WUJ:[id number] DOB: Dec 12, 1945 DOA: 05/08/2024 PCP: Thersia Flax, MD  No chief complaint on file.   Hospital Course:  Deborah Velazquez is a 78 y.o. female with medical history significant of Parkinson's disease, seizure disorder, Lewy body dementia, who presents to the emergency department on 6/13 with fever and strong smelling urine.  At that time patient was found to have mild hypokalemia, and urinalysis concerning for UTI.  Blood cultures were obtained during that visit.  Patient was sent with Augmentin .  On 6/14 blood cultures returned positive for gram-negative rods and patient was called to return to the ER. On return to the ED patient has no leukocytosis, labs notable for very mild hypokalemia of 3.4, hypoalbuminemia 3.1, and no other lab abnormalities.  Blood cultures have resulted positive for Klebsiella pneumonia.  CT renal stone protocol in the ED reveals chronic nephrolithiasis in the right kidney and mild left-sided hydronephrosis with left hydroureter and mild left pararenal and ureteral inflammatory stranding which does not appear significantly different from CT scan in February.  No obstructing calculus identified. Head CT also performed in the ED which reveals moderate to severe chronic small vessel ischemic disease and unchanged ventriculomegaly which may reflect central predominant cerebral atrophy or NPH.  Subjective: No acute events overnight. On evaluation today patient no acute complaints.  She is working with physical therapy.   Objective: Vitals:   05/08/24 2152 05/09/24 0213 05/09/24 0815 05/09/24 1607  BP: 135/82 119/72 134/69 103/70  Pulse: 73 82 71 75  Resp: 18 16 16 16   Temp: 98 F (36.7 C) 99.6 F (37.6 C)    TempSrc: Axillary     SpO2: 97% 97% 98% 97%  Weight:      Height:        Intake/Output Summary (Last 24 hours) at 05/09/2024 1655 Last data filed at 05/09/2024 1541 Gross per 24 hour  Intake 340 ml  Output 800  ml  Net -460 ml   Filed Weights   05/08/24 1007  Weight: 68 kg    Examination: Constitutional:  Normal appearance. Non toxic-appearing.  HENT: Head Normocephalic and atraumatic.  Mucous membranes are moist.  Eyes:  Extraocular intact. Conjunctivae normal. Pupils are equal, round, and reactive to light.  Cardiovascular: Rate and Rhythm: Normal rate and regular rhythm.  Pulmonary: Non labored, symmetric rise of chest wall.  Musculoskeletal:  Normal range of motion.  Skin: warm and dry. not jaundiced.  Neurological: Intermittently confused, oriented to self.  Knows most commands, requires redirection. Psychiatric: Mood and Affect congruent.   Assessment & Plan:  Principal Problem:   Sepsis (HCC)    Klebsiella bacteremia - Secondary to UTI, currently growing same. - Klebsiella sensitive to ceftriaxone , will continue - Repeat blood cultures from 6/14 are negative - May be able to transition to p.o. antibiotics for discharge home tomorrow.  Patient remains afebrile.  No leukocytosis.  UTI - Urine culture positive for Proteus and Klebsiella.  Currently covered by ceftriaxone .  Primary Parkinson's disease, advanced.  Has visual hallucinations - Resume home meds - At baseline patient requires 24/7 assistance, mostly bedbound, requires [facility] lift.  Caregiver reports her mental status waxes and wanes. - Patient's HCPOA endorses concern with recent coughing when eating.  SLP eval today indicates patient is currently at baseline and has no specifically skilled services required - PT/OT evals today.  Will resume home health services at discharge.  Family not interested in skilled nursing facility - Continue with  delirium precautions Continue frequent reorientation   Generalized debility - Patient is wheelchair-bound at baseline - 24/7 home health aide assistance. - Will resume home health services at discharge   Seizure disorder - Continue lamotrigine    Hypokalemia Replaced again  today - Trend CMP in a.m.  DVT prophylaxis: Heparin    Code Status: Limited: Do not attempt resuscitation (DNR) -DNR-LIMITED -Do Not Intubate/DNI  Disposition: Currently inpatient for IV antibiotics.  Will likely discharge home tomorrow with p.o.  Consultants:    Procedures:    Antimicrobials:  Anti-infectives (From admission, onward)    Start     Dose/Rate Route Frequency Ordered Stop   05/08/24 1800  cefTRIAXone  (ROCEPHIN ) 2 g in sodium chloride  0.9 % 100 mL IVPB        2 g 200 mL/hr over 30 Minutes Intravenous Every 24 hours 05/08/24 1039         Data Reviewed: I have personally reviewed following labs and imaging studies CBC: Recent Labs  Lab 05/07/24 1618 05/08/24 1024 05/09/24 0614  WBC 9.5 8.7 7.5  NEUTROABS 7.4 6.7 5.1  HGB 10.3* 10.7* 10.3*  HCT 32.3* 33.1* 32.4*  MCV 87.1 85.3 84.2  PLT 264 265 280   Basic Metabolic Panel: Recent Labs  Lab 05/07/24 1618 05/08/24 1024 05/09/24 0614  NA 137 140 140  K 3.2* 3.4* 3.3*  CL 103 107 107  CO2 25 23 23   GLUCOSE 162* 129* 114*  BUN 11 8 5*  CREATININE 0.69 0.50 0.57  CALCIUM  8.2* 8.9 8.7*  MG  --   --  2.0  PHOS  --   --  2.8   GFR: Estimated Creatinine Clearance: 51.3 mL/min (by C-G formula based on SCr of 0.57 mg/dL). Liver Function Tests: Recent Labs  Lab 05/07/24 1618 05/08/24 1024 05/09/24 0614  AST 18 14* 17  ALT 7 <5 7  ALKPHOS 60 64 56  BILITOT 0.5 0.8 0.8  PROT 6.5 7.2 6.5  ALBUMIN  2.8* 3.1* 2.9*   CBG: No results for input(s): GLUCAP in the last 168 hours.  Recent Results (from the past 240 hours)  Culture, blood (routine x 2)     Status: Abnormal (Preliminary result)   Collection Time: 05/07/24  4:18 PM   Specimen: BLOOD RIGHT ARM  Result Value Ref Range Status   Specimen Description   Final    BLOOD RIGHT ARM Performed at [facility], [address] [place], [place] [address]    Special Requests   Final    BOTTLES DRAWN AEROBIC AND ANAEROBIC Blood Culture  adequate volume Performed at [facility], [address] [place], [place] [address]    Culture  Setup Time   Final    GRAM NEGATIVE RODS ANAEROBIC BOTTLE ONLY CRITICAL RESULT CALLED TO, READ BACK BY AND VERIFIED WITH: MEGAN JONES ON 05/08/20 AT 8469 QSD Performed at [facility], [address], [place], [place] [address]    Culture KLEBSIELLA PNEUMONIAE (A)  Final   Report Status PENDING  Incomplete  Culture, blood (routine x 2)     Status: Abnormal (Preliminary result)   Collection Time: 05/07/24  4:18 PM   Specimen: BLOOD LEFT ARM  Result Value Ref Range Status   Specimen Description   Final    BLOOD LEFT ARM Performed at [facility], [address], [place], [place] [address]    Special Requests   Final    BOTTLES DRAWN AEROBIC AND ANAEROBIC Blood Culture results may not be optimal due to  an inadequate volume of blood received in culture bottles Performed at [facility], [address] [place], [place] [address]    Culture  Setup Time   Final    GRAM NEGATIVE RODS ANAEROBIC BOTTLE ONLY CRITICAL RESULT CALLED TO, READ BACK BY AND VERIFIED WITH: MEGAN JONES ON 05/08/24 AT 0109 QSD    Culture (A)  Final    KLEBSIELLA PNEUMONIAE SUSCEPTIBILITIES TO FOLLOW Performed at [facility], [address], [place], [place] [address]    Report Status PENDING  Incomplete  Blood Culture ID Panel (Reflexed)     Status: Abnormal   Collection Time: 05/07/24  4:18 PM  Result Value Ref Range Status   Enterococcus faecalis NOT DETECTED NOT DETECTED Final   Enterococcus Faecium NOT DETECTED NOT DETECTED Final   Listeria monocytogenes NOT DETECTED NOT DETECTED Final   Staphylococcus species NOT DETECTED NOT DETECTED Final   Staphylococcus aureus (BCID) NOT DETECTED NOT DETECTED Final   Staphylococcus epidermidis NOT DETECTED NOT DETECTED Final   Staphylococcus lugdunensis NOT DETECTED NOT DETECTED Final   Streptococcus species NOT DETECTED NOT  DETECTED Final   Streptococcus agalactiae NOT DETECTED NOT DETECTED Final   Streptococcus pneumoniae NOT DETECTED NOT DETECTED Final   Streptococcus pyogenes NOT DETECTED NOT DETECTED Final   A.calcoaceticus-baumannii NOT DETECTED NOT DETECTED Final   Bacteroides fragilis NOT DETECTED NOT DETECTED Final   Enterobacterales DETECTED (A) NOT DETECTED Final    Comment: Enterobacterales represent a large order of gram negative bacteria, not a single organism. CRITICAL RESULT CALLED TO, READ BACK BY AND VERIFIED WITH: MEGAN JONES ON 05/08/24 AT 0621 QSD    Enterobacter cloacae complex NOT DETECTED NOT DETECTED Final   Escherichia coli NOT DETECTED NOT DETECTED Final   Klebsiella aerogenes NOT DETECTED NOT DETECTED Final   Klebsiella oxytoca NOT DETECTED NOT DETECTED Final   Klebsiella pneumoniae DETECTED (A) NOT DETECTED Final    Comment: CRITICAL RESULT CALLED TO, READ BACK BY AND VERIFIED WITH: MEGAN JONES ON 05/08/24 AT 0621 QSD    Proteus species NOT DETECTED NOT DETECTED Final   Salmonella species NOT DETECTED NOT DETECTED Final   Serratia marcescens NOT DETECTED NOT DETECTED Final   Haemophilus influenzae NOT DETECTED NOT DETECTED Final   Neisseria meningitidis NOT DETECTED NOT DETECTED Final   Pseudomonas aeruginosa NOT DETECTED NOT DETECTED Final   Stenotrophomonas maltophilia NOT DETECTED NOT DETECTED Final   Candida albicans NOT DETECTED NOT DETECTED Final   Candida auris NOT DETECTED NOT DETECTED Final   Candida glabrata NOT DETECTED NOT DETECTED Final   Candida krusei NOT DETECTED NOT DETECTED Final   Candida parapsilosis NOT DETECTED NOT DETECTED Final   Candida tropicalis NOT DETECTED NOT DETECTED Final   Cryptococcus neoformans/gattii NOT DETECTED NOT DETECTED Final   CTX-M ESBL NOT DETECTED NOT DETECTED Final   Carbapenem resistance IMP NOT DETECTED NOT DETECTED Final   Carbapenem resistance KPC NOT DETECTED NOT DETECTED Final   Carbapenem resistance NDM NOT DETECTED NOT  DETECTED Final   Carbapenem resist OXA 48 LIKE NOT DETECTED NOT DETECTED Final   Carbapenem resistance VIM NOT DETECTED NOT DETECTED Final    Comment: Performed at [facility], [address] [place], [place] [address]  Urine Culture     Status: Abnormal   Collection Time: 05/07/24  6:37 PM   Specimen: Urine, Random  Result Value Ref Range Status   Specimen Description   Final    URINE, RANDOM Performed at [facility], [address] [place], [place] [address]    Special Requests   Final    NONE Reflexed from [id number] Performed at [facility], [address] [place], [place] [address]    Culture (A)  Final    >=100,000 COLONIES/mL KLEBSIELLA PNEUMONIAE 20,000 COLONIES/mL PROTEUS MIRABILIS    Report Status 05/09/2024 FINAL  Final   Organism ID, Bacteria KLEBSIELLA PNEUMONIAE (A)  Final   Organism ID, Bacteria PROTEUS MIRABILIS (A)  Final      Susceptibility   Klebsiella pneumoniae - MIC*    AMPICILLIN  RESISTANT Resistant     CEFAZOLIN  <=4 SENSITIVE Sensitive     CEFEPIME  <=0.12 SENSITIVE Sensitive     CEFTRIAXONE  <=0.25 SENSITIVE Sensitive     CIPROFLOXACIN <=0.25 SENSITIVE Sensitive     GENTAMICIN <=1 SENSITIVE Sensitive     IMIPENEM <=0.25 SENSITIVE Sensitive     NITROFURANTOIN 64 INTERMEDIATE Intermediate     TRIMETH /SULFA  <=20 SENSITIVE Sensitive     AMPICILLIN /SULBACTAM 4 SENSITIVE Sensitive     PIP/TAZO 8 SENSITIVE Sensitive ug/mL    * >=100,000 COLONIES/mL KLEBSIELLA PNEUMONIAE   Proteus mirabilis - MIC*    AMPICILLIN  <=2 SENSITIVE Sensitive     CEFAZOLIN  <=4 SENSITIVE Sensitive     CEFEPIME  <=0.12 SENSITIVE Sensitive     CEFTRIAXONE  <=0.25 SENSITIVE Sensitive     CIPROFLOXACIN <=0.25 SENSITIVE Sensitive     GENTAMICIN <=1 SENSITIVE Sensitive     IMIPENEM 2 SENSITIVE Sensitive     NITROFURANTOIN 128 RESISTANT Resistant     TRIMETH /SULFA  <=20 SENSITIVE Sensitive     AMPICILLIN /SULBACTAM <=2 SENSITIVE Sensitive     PIP/TAZO <=4  SENSITIVE Sensitive ug/mL    * 20,000 COLONIES/mL PROTEUS MIRABILIS  Resp panel by RT-PCR (RSV, Flu A&B, Covid) Anterior Nasal Swab     Status: None   Collection Time: 05/08/24 10:24 AM   Specimen: Anterior Nasal Swab  Result Value Ref Range Status   SARS Coronavirus 2 by RT PCR NEGATIVE NEGATIVE Final    Comment: (NOTE) SARS-CoV-2 target nucleic acids are NOT DETECTED.  The SARS-CoV-2 RNA is generally detectable in upper respiratory specimens during the acute phase of infection. The lowest concentration of SARS-CoV-2 viral copies this assay can detect is 138 copies/mL. A negative result does not preclude SARS-Cov-2 infection and should not be used as the sole basis for treatment or other patient management decisions. A negative result may occur with  improper specimen collection/handling, submission of specimen other than nasopharyngeal swab, presence of viral mutation(s) within the areas targeted by this assay, and inadequate number of viral copies(<138 copies/mL). A negative result must be combined with clinical observations, patient history, and epidemiological information. The expected result is Negative.  Fact Sheet for Patients:  BloggerCourse.com  Fact Sheet for Healthcare Providers:  SeriousBroker.it  This test is no t yet approved or cleared by the [place]  FDA and  has been authorized for detection and/or diagnosis of SARS-CoV-2 by FDA under an Emergency Use Authorization (EUA). This EUA will remain  in effect (meaning this test can be used) for the duration of the COVID-19 declaration under Section 564(b)(1) of the Act, 21 U.S.C.section 360bbb-3(b)(1), unless the authorization is terminated  or revoked sooner.       Influenza A by PCR NEGATIVE NEGATIVE Final   Influenza B by PCR NEGATIVE NEGATIVE Final    Comment: (NOTE) The Xpert Xpress SARS-CoV-2/FLU/RSV plus assay is intended as an aid in the diagnosis of  influenza from Nasopharyngeal swab specimens and should not be used as a sole  basis for treatment. Nasal washings and aspirates are unacceptable for Xpert Xpress SARS-CoV-2/FLU/RSV testing.  Fact Sheet for Patients: BloggerCourse.com  Fact Sheet for Healthcare Providers: SeriousBroker.it  This test is not yet approved or cleared by the [place]  FDA and has been authorized for detection and/or diagnosis of SARS-CoV-2 by FDA under an Emergency Use Authorization (EUA). This EUA will remain in effect (meaning this test can be used) for the duration of the COVID-19 declaration under Section 564(b)(1) of the Act, 21 U.S.C. section 360bbb-3(b)(1), unless the authorization is terminated or revoked.     Resp Syncytial Virus by PCR NEGATIVE NEGATIVE Final    Comment: (NOTE) Fact Sheet for Patients: BloggerCourse.com  Fact Sheet for Healthcare Providers: SeriousBroker.it  This test is not yet approved or cleared by the [place]  FDA and has been authorized for detection and/or diagnosis of SARS-CoV-2 by FDA under an Emergency Use Authorization (EUA). This EUA will remain in effect (meaning this test can be used) for the duration of the COVID-19 declaration under Section 564(b)(1) of the Act, 21 U.S.C. section 360bbb-3(b)(1), unless the authorization is terminated or revoked.  Performed at [facility], [address] [place], [place] [address]   Blood Culture (routine x 2)     Status: None (Preliminary result)   Collection Time: 05/08/24 10:24 AM   Specimen: BLOOD  Result Value Ref Range Status   Specimen Description BLOOD BLOOD RIGHT FOREARM  Final   Special Requests   Final    BOTTLES DRAWN AEROBIC AND ANAEROBIC Blood Culture adequate volume   Culture   Final    NO GROWTH < 24 HOURS Performed at [facility], [address] [place], [place]  [address]    Report Status PENDING  Incomplete  Blood Culture (routine x 2)     Status: None (Preliminary result)   Collection Time: 05/08/24 10:24 AM   Specimen: BLOOD  Result Value Ref Range Status   Specimen Description BLOOD LEFT ANTECUBITAL  Final   Special Requests   Final    BOTTLES DRAWN AEROBIC AND ANAEROBIC Blood Culture adequate volume   Culture   Final    NO GROWTH < 24 HOURS Performed at [facility], [address] [place], [place] [address]    Report Status PENDING  Incomplete     Radiology Studies: CT Renal Stone Study Result Date: 05/08/2024 CLINICAL DATA:  78 year old female recently diagnosed and treated for UTI, callback for positive blood cultures (gram-negative rods). Parkinson's disease, bed-bound. Code sepsis. EXAM: CT ABDOMEN AND PELVIS WITHOUT CONTRAST TECHNIQUE: Multidetector CT imaging of the abdomen and pelvis was performed following the standard protocol without IV contrast. RADIATION DOSE REDUCTION: This exam was performed according to the departmental dose-optimization program which includes automated exposure control, adjustment of the mA and/or kV according to patient size and/or use of iterative reconstruction technique. COMPARISON:  CT Abdomen and Pelvis with contrast 01/09/2024. FINDINGS: Lower chest: Stable heart size. Upper limits of normal to mildly enlarged. No pericardial effusion. Right greater than left dependent lung base opacity unchanged since February and most compatible with atelectasis. Hepatobiliary: Negative noncontrast liver and gallbladder. Pancreas: Negative. Spleen: Negative. Adrenals/Urinary Tract: Adrenal glands remain within normal limits. Chronic nephrolithiasis, regressed in the right kidney since February, stable and the left lower pole since that time. No right hydronephrosis and diminutive right ureter. Some streak artifact in the pelvis related to left hip arthroplasty. The left ureter and left renal collecting system are  asymmetrically larger, although similar to the  February CT. Urothelial thickening at that time persists. And there is mild left perinephric, periureteral inflammatory stranding also not significantly changed. No left ureteral calculus or obstructing etiology can be identified. Unremarkable urinary bladder. Chronic pelvic phleboliths. Stomach/Bowel: Moderate volume retained stool throughout the large bowel which is nondilated. Fluid in the proximal ascending colon. Gas distended but nondilated proximal cecum which ends on a lax mesentery in the anterior mid abdomen. Appendix not identified. Fluid containing but nondilated small bowel loops. Stomach and duodenum mostly decompressed. No pneumoperitoneum. No free fluid. No mesenteric inflammation identified. Vascular/Lymphatic: Aortoiliac calcified atherosclerosis. Vascular patency is not evaluated in the absence of IV contrast. Normal caliber abdominal aorta. No lymphadenopathy identified. Reproductive: Stable, negative. Other: No pelvis free fluid. Musculoskeletal: Chronic lower lumbar fusion hardware. Osteopenia. Chronic left hip arthroplasty. No acute osseous abnormality identified. IMPRESSION: 1. Chronic nephrolithiasis, regressed in the right kidney since February. But ongoing Left side mild hydronephrosis, left hydroureter, mild left pararenal and ureteral inflammatory stranding - which do not appear significantly different from the February CT. No obstructing calculus or obstructing etiology is identified. Consider acute or chronic ascending left urinary infection. 2. No other acute or inflammatory process identified in the noncontrast abdomen or pelvis. 3.  Aortic Atherosclerosis (ICD10-I70.0). Electronically Signed   By: Marlise Simpers M.D.   On: 05/08/2024 11:19   CT HEAD WO CONTRAST ( ) Result Date: 05/08/2024 CLINICAL DATA:  Head trauma, minor (Age >= 65y). Altered mental status. EXAM: CT HEAD WITHOUT CONTRAST TECHNIQUE: Contiguous axial images were obtained  from the base of the skull through the vertex without intravenous contrast. RADIATION DOSE REDUCTION: This exam was performed according to the departmental dose-optimization program which includes automated exposure control, adjustment of the mA and/or kV according to patient size and/or use of iterative reconstruction technique. COMPARISON:  Head CT 05/07/2023 FINDINGS: Brain: There is no evidence of an acute infarct, intracranial hemorrhage, mass, midline shift, or extra-axial fluid collection. Patchy to confluent hypodensities in the cerebral white matter are similar to the prior CT and are nonspecific but compatible with moderate to severe chronic small vessel ischemic disease. Chronic lacunar infarcts are present in the deep gray nuclei/deep white matter. Ventriculomegaly is unchanged and is again noted to be out of proportion to the size of the sulci. Vascular: No hyperdense vessel. Skull: No fracture or suspicious lesion. Sinuses/Orbits: Minimal mucosal thickening in the paranasal sinuses. Clear mastoid air cells. Bilateral cataract extraction. Other: None. IMPRESSION: 1. No evidence of acute intracranial abnormality. 2. Moderate to severe chronic small vessel ischemic disease. 3. Unchanged ventriculomegaly which may reflect central predominant cerebral atrophy or normal pressure hydrocephalus in the appropriate clinical setting. Electronically Signed   By: Aundra Lee M.D.   On: 05/08/2024 11:11    Scheduled Meds:  aspirin  EC  81 mg Oral Daily   atorvastatin   20 mg Oral Daily   carbidopa -levodopa   1 tablet Oral 5 X Daily   DULoxetine   60 mg Oral Daily   feeding supplement  237 mL Oral BID BM   heparin   5,000 Units Subcutaneous Q8H   lamoTRIgine   100 mg Oral BID   pregabalin   150 mg Oral BID   QUEtiapine   12.5 mg Oral q AM   QUEtiapine   25 mg Oral QHS   Continuous Infusions:  cefTRIAXone  (ROCEPHIN )  IV Stopped (05/08/24 1913)     LOS: 1 day  MDM: Patient is high risk for one or more organ  failure.  They necessitate ongoing hospitalization for continued IV therapies and  subsequent lab monitoring. Total time spent interpreting labs and vitals, reviewing the medical record, coordinating care amongst consultants and care team members, directly assessing and discussing care with the patient and/or family: 55 min  Aaran Enberg, DO [facility]  To contact the attending physician between 7A-7P please use Epic Chat. To contact the covering physician during after hours 7P-7A, please review Amion.  05/09/2024, 4:55 PM   *This document has been created with the assistance of dictation software. Please excuse typographical errors. *

## 2024-05-10 DIAGNOSIS — N12 Tubulo-interstitial nephritis, not specified as acute or chronic: Secondary | ICD-10-CM | POA: Diagnosis not present

## 2024-05-10 DIAGNOSIS — R7881 Bacteremia: Secondary | ICD-10-CM | POA: Diagnosis not present

## 2024-05-10 LAB — MAGNESIUM: Magnesium: 2.2 mg/dL (ref 1.7–2.4)

## 2024-05-10 LAB — CBC WITH DIFFERENTIAL/PLATELET
Abs Immature Granulocytes: 0.01 10*3/uL (ref 0.00–0.07)
Basophils Absolute: 0 10*3/uL (ref 0.0–0.1)
Basophils Relative: 1 %
Eosinophils Absolute: 0.2 10*3/uL (ref 0.0–0.5)
Eosinophils Relative: 3 %
HCT: 33.7 % — ABNORMAL LOW (ref 36.0–46.0)
Hemoglobin: 10.6 g/dL — ABNORMAL LOW (ref 12.0–15.0)
Immature Granulocytes: 0 %
Lymphocytes Relative: 33 %
Lymphs Abs: 2 10*3/uL (ref 0.7–4.0)
MCH: 26.9 pg (ref 26.0–34.0)
MCHC: 31.5 g/dL (ref 30.0–36.0)
MCV: 85.5 fL (ref 80.0–100.0)
Monocytes Absolute: 0.5 10*3/uL (ref 0.1–1.0)
Monocytes Relative: 9 %
Neutro Abs: 3.3 10*3/uL (ref 1.7–7.7)
Neutrophils Relative %: 54 %
Platelets: 302 10*3/uL (ref 150–400)
RBC: 3.94 MIL/uL (ref 3.87–5.11)
RDW: 15.9 % — ABNORMAL HIGH (ref 11.5–15.5)
WBC: 6 10*3/uL (ref 4.0–10.5)
nRBC: 0 % (ref 0.0–0.2)

## 2024-05-10 LAB — COMPREHENSIVE METABOLIC PANEL WITH GFR
ALT: <5 U/L (ref 0–44)
AST: 14 U/L — ABNORMAL LOW (ref 15–41)
Albumin: 3 g/dL — ABNORMAL LOW (ref 3.5–5.0)
Alkaline Phosphatase: 59 U/L (ref 38–126)
Anion gap: 12 (ref 5–15)
BUN: 6 mg/dL — ABNORMAL LOW (ref 8–23)
CO2: 25 mmol/L (ref 22–32)
Calcium: 9.1 mg/dL (ref 8.9–10.3)
Chloride: 111 mmol/L (ref 98–111)
Creatinine, Ser: 0.53 mg/dL (ref 0.44–1.00)
GFR, Estimated: >60 mL/min (ref >60)
Glucose, Bld: 95 mg/dL (ref 70–99)
Potassium: 3.6 mmol/L (ref 3.5–5.1)
Sodium: 145 mmol/L (ref 135–145)
Total Bilirubin: 0.7 mg/dL (ref 0.0–1.2)
Total Protein: 6.8 g/dL (ref 6.5–8.1)

## 2024-05-10 LAB — PHOSPHORUS: Phosphorus: 4.2 mg/dL (ref 2.5–4.6)

## 2024-05-10 LAB — CULTURE, BLOOD (ROUTINE X 2): Special Requests: ADEQUATE

## 2024-05-10 MED ORDER — SODIUM CHLORIDE 0.9% FLUSH: 10.0000 mL | INTRAVENOUS | Status: DC | PRN

## 2024-05-10 MED ORDER — SODIUM CHLORIDE 0.9% FLUSH
10.0000 mL | Freq: Two times a day (BID) | INTRAVENOUS | Status: DC
  Administered 2024-05-10: 10 mL
  Administered 2024-05-11: 20 mL

## 2024-05-10 NOTE — TOC Initial Note (Signed)
 Transition of Care [facility]) - Initial/Assessment Note    Patient Details  Name: Deborah Velazquez MRN: [id number] Date of Birth: Jun 19, 1946  Transition of Care [facility]) CM/SW Contact:    Loman Risk, RN Phone Number: 05/10/2024, 4:13 PM  Clinical Narrative:                   Admitted for: Bacteremia Admitted from: home with husband [id number]  Current home health/prior home health/DME:Rolling Walker (2 wheels);Rollator (4 wheels);BSC/3in1;Wheelchair - manual;Shower seat;Hospital bed;Hand held shower head;Grab bars - tub/shower;Other (comment) (Hoyer lift)   Has 3 shifts of caregivers that provide round the clock care Patient active with [facility]  POA linda inquiring if they can just to use [facility] at discharge instead of [facility].  Message sent to [facility] director to clarify         Patient Goals and CMS Choice            Expected Discharge Plan and Services                                              Prior Living Arrangements/Services                       Activities of Daily Living   ADL Screening (condition at time of admission) Independently performs ADLs?: No Does the patient have a NEW difficulty with bathing/dressing/toileting/self-feeding that is expected to last >3 days?: Yes (Initiates electronic notice to provider for possible OT consult) Does the patient have a NEW difficulty with getting in/out of bed, walking, or climbing stairs that is expected to last >3 days?: Yes (Initiates electronic notice to provider for possible PT consult) Does the patient have a NEW difficulty with communication that is expected to last >3 days?: No Is the patient deaf or have difficulty hearing?: Yes Does the patient have difficulty seeing, even when wearing glasses/contacts?: No Does the patient have difficulty concentrating, remembering, or making decisions?: Yes  Permission Sought/Granted                  Emotional Assessment               Admission diagnosis:  Bacteremia [R78.81] Pyelonephritis [N12] Sepsis (HCC) [A41.9] Patient Active Problem List   Diagnosis Date Noted   Pyelonephritis 05/09/2024   Bacteremia 05/09/2024   Sepsis (HCC) 05/08/2024   Sepsis due to urinary tract infection (HCC) 01/10/2024   Intra-abdominal and pelvic swelling, mass and lump, unspecified site 06/04/2023   Closed fracture of neck of left femur (HCC) 05/07/2023   Closed right hip fracture (HCC) 05/07/2023   Hypokalemia 03/20/2023   History of TIA (transient ischemic attack) 03/19/2023   Lewy body dementia (HCC) 03/17/2023   Aspiration pneumonia of right lower lobe (HCC) 03/17/2023   Overweight (BMI 25.0-29.9) 03/17/2023   Candidiasis of skin 01/06/2021   Lethargy 10/25/2020   Encounter for preventive health examination 07/01/2020   Proximal leg weakness 07/01/2020   Allergic rhinitis due to pollen 03/06/2019   Atrophic vaginitis 11/25/2018   Spondylolisthesis of lumbosacral region 10/07/2018   Preoperative clearance 09/13/2018   Neuropathy 07/13/2018   Cognitive impairment 09/22/2017   Parkinson's disease (HCC) 08/19/2017   Prediabetes 07/26/2016   Pure hypercholesterolemia 12/23/2014   Anxiety 06/24/2014   PCP:  Thersia Flax, MD Pharmacy:   [facility] [phone] [place], [place] -  [address] AT [facility] [address] [facility] [address] [place] [address] Phone: [phone] Fax: [phone]     Social Drivers of Health (SDOH) Social History: SDOH Screenings   Food Insecurity: No Food Insecurity (05/08/2024)  Housing: Patient Declined (05/08/2024)  Transportation Needs: No Transportation Needs (05/08/2024)  Utilities: Not At Risk (05/08/2024)  Alcohol Screen: Low Risk  (11/11/2023)  Depression (PHQ2-9): High Risk (11/11/2023)  Financial Resource Strain: Low Risk  (11/11/2023)  Physical Activity: Inactive (11/11/2023)  Social Connections: Patient Declined (05/08/2024)  Stress: No Stress  Concern Present (11/11/2023)  Tobacco Use: Low Risk  (05/08/2024)  Health Literacy: Inadequate Health Literacy (11/11/2023)   SDOH Interventions:     Readmission Risk Interventions     No data to display

## 2024-05-10 NOTE — Progress Notes (Addendum)
 PROGRESS NOTE    Deborah Velazquez  JXB:[id number] DOB: [birth year] DOA: 05/08/2024 PCP: Thersia Flax, MD  No chief complaint on file.   Hospital Course:  Deborah Velazquez is a 78 y.o. female with medical history significant of Parkinson's disease, seizure disorder, Lewy body dementia, who presents to the emergency department on 6/13 with fever and strong smelling urine.  At that time patient was found to have mild hypokalemia, and urinalysis concerning for UTI.  Blood cultures were obtained during that visit.  Patient was sent with Augmentin .  On 6/14 blood cultures returned positive for gram-negative rods and patient was called to return to the ER. On return to the ED patient has no leukocytosis, labs notable for very mild hypokalemia of 3.4, hypoalbuminemia 3.1, and no other lab abnormalities.  Blood cultures have resulted positive for Klebsiella pneumonia.  CT renal stone protocol in the ED reveals chronic nephrolithiasis in the right kidney and mild left-sided hydronephrosis with left hydroureter and mild left pararenal and ureteral inflammatory stranding which does not appear significantly different from CT scan in February.  No obstructing calculus identified. Head CT also performed in the ED which reveals moderate to severe chronic small vessel ischemic disease and unchanged ventriculomegaly which may reflect central predominant cerebral atrophy or NPH.  Subjective: No acute events overnight.  The patient is very sleepy today.  Her friends at bedside reports that this is normal behavior and that she often has periods of somnolence that can last over 24 hours.  They now endorse concerns with bringing her home on p.o. antibiotics as they fear that she will not be able to wake up enough to take them   Objective: Vitals:   05/10/24 0405 05/10/24 0414 05/10/24 0805 05/10/24 1037  BP:  124/67 139/74   Pulse:  (!) 59 64   Resp:  17 20   Temp:  97.8 F (36.6 C) 98.9 F (37.2 C)   TempSrc:   Axillary Oral   SpO2: (!) 88% 95% 98% 91%  Weight:      Height:        Intake/Output Summary (Last 24 hours) at 05/10/2024 1427 Last data filed at 05/10/2024 1052 Gross per 24 hour  Intake 460 ml  Output 1100 ml  Net -640 ml   Filed Weights   05/08/24 1007  Weight: 68 kg    Examination: Constitutional:  Normal appearance. Non toxic-appearing.  HENT: Head Normocephalic and atraumatic.  Mucous membranes are moist.  Eyes:  Extraocular intact. Conjunctivae normal. Pupils are equal, round, and reactive to light.  Cardiovascular: Rate and Rhythm: Normal rate and regular rhythm.  Pulmonary: Non labored, symmetric rise of chest wall.  Musculoskeletal:  Normal range of motion.  Skin: warm and dry. not jaundiced.  Neurological: Intermittently confused, oriented to self.  Knows most commands, requires redirection. Psychiatric: Mood and Affect congruent.   Assessment & Plan:  Principal Problem:   Sepsis (HCC) Active Problems:   Pyelonephritis   Bacteremia    Klebsiella bacteremia - Secondary to UTI, currently growing same. - Klebsiella sensitive to ceftriaxone .  We will continue this for an additional day until 6/17 which will complete 5 days of IV antibiotic therapy. At discharge: Cefadroxil 1000 mg po BID  to complete a 14-day course. Family is resistant to sending home with p.o. antibiotics at this time because they fear that they will not be able to consistently get her to take the pills. - Patient is not an excellent OPAT candidate given agitation with  IV therapy while admitted and has pulled out her IV once already. - Have discussed above antibiotic plan with infectious disease - Repeat blood cultures from 6/14 are negative - Patient remains afebrile with no leukocytosis  Left-sided nephrolithiasis - Seen on CT on arrival.  Nonobstructive.  Chronic and largely unchanged since February 2024 - These are not currently problematic but may be contributing to recurrent UTIs  -  Will refer to urology at discharge.  Have discussed this with the patient's HCPOA.  UTI - Urine culture positive for Proteus and Klebsiella. S/p adequate treatment with abx as above  Primary Parkinson's disease, advanced.  Has visual hallucinations - Resume home meds - At baseline patient requires 24/7 assistance, mostly bedbound, requires [facility] lift.  Caregiver reports her mental status waxes and wanes. - Patient's HCPOA endorses concern with recent coughing when eating.  SLP eval here indicates patient is currently at baseline and has no specifically skilled services required - Have ordered to resume home health services at discharge.  Family not interested in skilled nursing facility - Continue with delirium precautions Continue frequent reorientation   Generalized debility - Patient is wheelchair-bound at baseline - 24/7 home health aide assistance. - Will resume home health services at discharge   Seizure disorder - Continue lamotrigine    Hypokalemia - Continue to trend CMP.  Replacement needed.  Currently stable  DVT prophylaxis: Heparin    Code Status: Full Code upon further discussion with the patient's HCPOA today she would like to rescind prior DNR and make the patient full code.  She reports after further reflection she would like to have more extensive conversation with the patient's husband and work on developing a more thorough advance directive for the patient. Disposition: Currently inpatient for IV antibiotics.  Will likely discharge home tomorrow  Consultants:    Procedures:    Antimicrobials:  Anti-infectives (From admission, onward)    Start     Dose/Rate Route Frequency Ordered Stop   05/08/24 1800  cefTRIAXone  (ROCEPHIN ) 2 g in sodium chloride  0.9 % 100 mL IVPB        2 g 200 mL/hr over 30 Minutes Intravenous Every 24 hours 05/08/24 1039         Data Reviewed: I have personally reviewed following labs and imaging studies CBC: Recent Labs  Lab  05/07/24 1618 05/08/24 1024 05/09/24 0614 05/10/24 0437  WBC 9.5 8.7 7.5 6.0  NEUTROABS 7.4 6.7 5.1 3.3  HGB 10.3* 10.7* 10.3* 10.6*  HCT 32.3* 33.1* 32.4* 33.7*  MCV 87.1 85.3 84.2 85.5  PLT 264 265 280 302   Basic Metabolic Panel: Recent Labs  Lab 05/07/24 1618 05/08/24 1024 05/09/24 0614 05/10/24 0437  NA 137 140 140 145  K 3.2* 3.4* 3.3* 3.6  CL 103 107 107 111  CO2 25 23 23 25   GLUCOSE 162* 129* 114* 95  BUN 11 8 5* 6*  CREATININE 0.69 0.50 0.57 0.53  CALCIUM  8.2* 8.9 8.7* 9.1  MG  --   --  2.0 2.2  PHOS  --   --  2.8 4.2   GFR: Estimated Creatinine Clearance: 51.3 mL/min (by C-G formula based on SCr of 0.53 mg/dL). Liver Function Tests: Recent Labs  Lab 05/07/24 1618 05/08/24 1024 05/09/24 0614 05/10/24 0437  AST 18 14* 17 14*  ALT 7 <5 7 <5  ALKPHOS 60 64 56 59  BILITOT 0.5 0.8 0.8 0.7  PROT 6.5 7.2 6.5 6.8  ALBUMIN  2.8* 3.1* 2.9* 3.0*   CBG: No results for  input(s): GLUCAP in the last 168 hours.  Recent Results (from the past 240 hours)  Culture, blood (routine x 2)     Status: Abnormal   Collection Time: 05/07/24  4:18 PM   Specimen: BLOOD RIGHT ARM  Result Value Ref Range Status   Specimen Description   Final    BLOOD RIGHT ARM Performed at [facility], [address], [place], [place] [address]    Special Requests   Final    BOTTLES DRAWN AEROBIC AND ANAEROBIC Blood Culture adequate volume Performed at [facility], [address] [place], [place] [address]    Culture  Setup Time   Final    GRAM NEGATIVE RODS ANAEROBIC BOTTLE ONLY CRITICAL RESULT CALLED TO, READ BACK BY AND VERIFIED WITH: MEGAN JONES ON 05/08/20 AT 0981 QSD Performed at [facility], [address] [place], [place] [address]    Culture (A)  Final    KLEBSIELLA PNEUMONIAE SUSCEPTIBILITIES PERFORMED ON PREVIOUS CULTURE WITHIN THE LAST 5 DAYS. Performed at [facility], [address] [place], [place] [address]    Report Status  05/10/2024 FINAL  Final  Culture, blood (routine x 2)     Status: Abnormal (Preliminary result)   Collection Time: 05/07/24  4:18 PM   Specimen: BLOOD LEFT ARM  Result Value Ref Range Status   Specimen Description   Final    BLOOD LEFT ARM Performed at [facility], [address], [place], [place] [address]    Special Requests   Final    BOTTLES DRAWN AEROBIC AND ANAEROBIC Blood Culture results may not be optimal due to an inadequate volume of blood received in culture bottles Performed at [facility], [address], [place], [place] [address]    Culture  Setup Time   Final    GRAM NEGATIVE RODS ANAEROBIC BOTTLE ONLY CRITICAL RESULT CALLED TO, READ BACK BY AND VERIFIED WITH: MEGAN JONES ON 05/08/24 AT 6962 QSD Performed at [facility], [address], [place], [place] [address]    Culture KLEBSIELLA PNEUMONIAE (A)  Final   Report Status PENDING  Incomplete   Organism ID, Bacteria KLEBSIELLA PNEUMONIAE  Final      Susceptibility   Klebsiella pneumoniae - MIC*    AMPICILLIN  >=32 RESISTANT Resistant     CEFEPIME  <=0.12 SENSITIVE Sensitive     CEFTAZIDIME <=1 SENSITIVE Sensitive     CEFTRIAXONE  <=0.25 SENSITIVE Sensitive     CIPROFLOXACIN <=0.25 SENSITIVE Sensitive     GENTAMICIN <=1 SENSITIVE Sensitive     IMIPENEM <=0.25 SENSITIVE Sensitive     TRIMETH /SULFA  <=20 SENSITIVE Sensitive     AMPICILLIN /SULBACTAM 4 SENSITIVE Sensitive     PIP/TAZO 8 SENSITIVE Sensitive ug/mL    * KLEBSIELLA PNEUMONIAE  Blood Culture ID Panel (Reflexed)     Status: Abnormal   Collection Time: 05/07/24  4:18 PM  Result Value Ref Range Status   Enterococcus faecalis NOT DETECTED NOT DETECTED Final   Enterococcus Faecium NOT DETECTED NOT DETECTED Final   Listeria monocytogenes NOT DETECTED NOT DETECTED Final   Staphylococcus species NOT DETECTED NOT DETECTED Final   Staphylococcus aureus (BCID) NOT DETECTED NOT DETECTED Final   Staphylococcus epidermidis NOT DETECTED NOT  DETECTED Final   Staphylococcus lugdunensis NOT DETECTED NOT DETECTED Final   Streptococcus species NOT DETECTED NOT DETECTED Final   Streptococcus agalactiae NOT DETECTED NOT DETECTED Final   Streptococcus pneumoniae NOT DETECTED NOT DETECTED Final   Streptococcus pyogenes NOT DETECTED NOT DETECTED  Final   A.calcoaceticus-baumannii NOT DETECTED NOT DETECTED Final   Bacteroides fragilis NOT DETECTED NOT DETECTED Final   Enterobacterales DETECTED (A) NOT DETECTED Final    Comment: Enterobacterales represent a large order of gram negative bacteria, not a single organism. CRITICAL RESULT CALLED TO, READ BACK BY AND VERIFIED WITH: MEGAN JONES ON 05/08/24 AT 0621 QSD    Enterobacter cloacae complex NOT DETECTED NOT DETECTED Final   Escherichia coli NOT DETECTED NOT DETECTED Final   Klebsiella aerogenes NOT DETECTED NOT DETECTED Final   Klebsiella oxytoca NOT DETECTED NOT DETECTED Final   Klebsiella pneumoniae DETECTED (A) NOT DETECTED Final    Comment: CRITICAL RESULT CALLED TO, READ BACK BY AND VERIFIED WITH: MEGAN JONES ON 05/08/24 AT 0621 QSD    Proteus species NOT DETECTED NOT DETECTED Final   Salmonella species NOT DETECTED NOT DETECTED Final   Serratia marcescens NOT DETECTED NOT DETECTED Final   Haemophilus influenzae NOT DETECTED NOT DETECTED Final   Neisseria meningitidis NOT DETECTED NOT DETECTED Final   Pseudomonas aeruginosa NOT DETECTED NOT DETECTED Final   Stenotrophomonas maltophilia NOT DETECTED NOT DETECTED Final   Candida albicans NOT DETECTED NOT DETECTED Final   Candida auris NOT DETECTED NOT DETECTED Final   Candida glabrata NOT DETECTED NOT DETECTED Final   Candida krusei NOT DETECTED NOT DETECTED Final   Candida parapsilosis NOT DETECTED NOT DETECTED Final   Candida tropicalis NOT DETECTED NOT DETECTED Final   Cryptococcus neoformans/gattii NOT DETECTED NOT DETECTED Final   CTX-M ESBL NOT DETECTED NOT DETECTED Final   Carbapenem resistance IMP NOT DETECTED NOT  DETECTED Final   Carbapenem resistance KPC NOT DETECTED NOT DETECTED Final   Carbapenem resistance NDM NOT DETECTED NOT DETECTED Final   Carbapenem resist OXA 48 LIKE NOT DETECTED NOT DETECTED Final   Carbapenem resistance VIM NOT DETECTED NOT DETECTED Final    Comment: Performed at [facility], [address], [place], [place] [address]  Urine Culture     Status: Abnormal   Collection Time: 05/07/24  6:37 PM   Specimen: Urine, Random  Result Value Ref Range Status   Specimen Description   Final    URINE, RANDOM Performed at [facility], [address] [place], [place] [address]    Special Requests   Final    NONE Reflexed from [id number] Performed at [facility], [address] [place], [place] [address]    Culture (A)  Final    >=100,000 COLONIES/mL KLEBSIELLA PNEUMONIAE 20,000 COLONIES/mL PROTEUS MIRABILIS    Report Status 05/09/2024 FINAL  Final   Organism ID, Bacteria KLEBSIELLA PNEUMONIAE (A)  Final   Organism ID, Bacteria PROTEUS MIRABILIS (A)  Final      Susceptibility   Klebsiella pneumoniae - MIC*    AMPICILLIN  RESISTANT Resistant     CEFAZOLIN  <=4 SENSITIVE Sensitive     CEFEPIME  <=0.12 SENSITIVE Sensitive     CEFTRIAXONE  <=0.25 SENSITIVE Sensitive     CIPROFLOXACIN <=0.25 SENSITIVE Sensitive     GENTAMICIN <=1 SENSITIVE Sensitive     IMIPENEM <=0.25 SENSITIVE Sensitive     NITROFURANTOIN 64 INTERMEDIATE Intermediate     TRIMETH /SULFA  <=20 SENSITIVE Sensitive     AMPICILLIN /SULBACTAM 4 SENSITIVE Sensitive     PIP/TAZO 8 SENSITIVE Sensitive ug/mL    * >=100,000 COLONIES/mL KLEBSIELLA PNEUMONIAE   Proteus mirabilis - MIC*    AMPICILLIN  <=2 SENSITIVE Sensitive     CEFAZOLIN  <=4 SENSITIVE Sensitive     CEFEPIME  <=0.12 SENSITIVE Sensitive     CEFTRIAXONE  <=  0.25 SENSITIVE Sensitive     CIPROFLOXACIN <=0.25 SENSITIVE Sensitive     GENTAMICIN <=1 SENSITIVE Sensitive     IMIPENEM 2 SENSITIVE Sensitive     NITROFURANTOIN 128 RESISTANT  Resistant     TRIMETH /SULFA  <=20 SENSITIVE Sensitive     AMPICILLIN /SULBACTAM <=2 SENSITIVE Sensitive     PIP/TAZO <=4 SENSITIVE Sensitive ug/mL    * 20,000 COLONIES/mL PROTEUS MIRABILIS  Resp panel by RT-PCR (RSV, Flu A&B, Covid) Anterior Nasal Swab     Status: None   Collection Time: 05/08/24 10:24 AM   Specimen: Anterior Nasal Swab  Result Value Ref Range Status   SARS Coronavirus 2 by RT PCR NEGATIVE NEGATIVE Final    Comment: (NOTE) SARS-CoV-2 target nucleic acids are NOT DETECTED.  The SARS-CoV-2 RNA is generally detectable in upper respiratory specimens during the acute phase of infection. The lowest concentration of SARS-CoV-2 viral copies this assay can detect is 138 copies/mL. A negative result does not preclude SARS-Cov-2 infection and should not be used as the sole basis for treatment or other patient management decisions. A negative result may occur with  improper specimen collection/handling, submission of specimen other than nasopharyngeal swab, presence of viral mutation(s) within the areas targeted by this assay, and inadequate number of viral copies(<138 copies/mL). A negative result must be combined with clinical observations, patient history, and epidemiological information. The expected result is Negative.  Fact Sheet for Patients:  BloggerCourse.com  Fact Sheet for Healthcare Providers:  SeriousBroker.it  This test is no t yet approved or cleared by the [place]  FDA and  has been authorized for detection and/or diagnosis of SARS-CoV-2 by FDA under an Emergency Use Authorization (EUA). This EUA will remain  in effect (meaning this test can be used) for the duration of the COVID-19 declaration under Section 564(b)(1) of the Act, 21 U.S.C.section 360bbb-3(b)(1), unless the authorization is terminated  or revoked sooner.       Influenza A by PCR NEGATIVE NEGATIVE Final   Influenza B by PCR NEGATIVE  NEGATIVE Final    Comment: (NOTE) The Xpert Xpress SARS-CoV-2/FLU/RSV plus assay is intended as an aid in the diagnosis of influenza from Nasopharyngeal swab specimens and should not be used as a sole basis for treatment. Nasal washings and aspirates are unacceptable for Xpert Xpress SARS-CoV-2/FLU/RSV testing.  Fact Sheet for Patients: BloggerCourse.com  Fact Sheet for Healthcare Providers: SeriousBroker.it  This test is not yet approved or cleared by the [place]  FDA and has been authorized for detection and/or diagnosis of SARS-CoV-2 by FDA under an Emergency Use Authorization (EUA). This EUA will remain in effect (meaning this test can be used) for the duration of the COVID-19 declaration under Section 564(b)(1) of the Act, 21 U.S.C. section 360bbb-3(b)(1), unless the authorization is terminated or revoked.     Resp Syncytial Virus by PCR NEGATIVE NEGATIVE Final    Comment: (NOTE) Fact Sheet for Patients: BloggerCourse.com  Fact Sheet for Healthcare Providers: SeriousBroker.it  This test is not yet approved or cleared by the [place]  FDA and has been authorized for detection and/or diagnosis of SARS-CoV-2 by FDA under an Emergency Use Authorization (EUA). This EUA will remain in effect (meaning this test can be used) for the duration of the COVID-19 declaration under Section 564(b)(1) of the Act, 21 U.S.C. section 360bbb-3(b)(1), unless the authorization is terminated or revoked.  Performed at [facility], [address] [place], [place] [address]   Blood Culture (routine x 2)     Status: None (Preliminary  result)   Collection Time: 05/08/24 10:24 AM   Specimen: BLOOD  Result Value Ref Range Status   Specimen Description BLOOD BLOOD RIGHT FOREARM  Final   Special Requests   Final    BOTTLES DRAWN AEROBIC AND ANAEROBIC Blood Culture adequate volume    Culture   Final    NO GROWTH 2 DAYS Performed at [facility], [address], [place], [place] [address]    Report Status PENDING  Incomplete  Blood Culture (routine x 2)     Status: None (Preliminary result)   Collection Time: 05/08/24 10:24 AM   Specimen: BLOOD  Result Value Ref Range Status   Specimen Description BLOOD LEFT ANTECUBITAL  Final   Special Requests   Final    BOTTLES DRAWN AEROBIC AND ANAEROBIC Blood Culture adequate volume   Culture   Final    NO GROWTH 2 DAYS Performed at [facility], [address] [place], [place] [address]    Report Status PENDING  Incomplete     Radiology Studies: No results found.   Scheduled Meds:  aspirin  EC  81 mg Oral Daily   atorvastatin   20 mg Oral Daily   carbidopa -levodopa   1 tablet Oral 5 X Daily   DULoxetine   60 mg Oral Daily   feeding supplement  237 mL Oral BID BM   heparin   5,000 Units Subcutaneous Q8H   lamoTRIgine   100 mg Oral BID   pregabalin   150 mg Oral BID   QUEtiapine   12.5 mg Oral q AM   QUEtiapine   25 mg Oral QHS   Continuous Infusions:  cefTRIAXone  (ROCEPHIN )  IV 2 g (05/09/24 1749)     LOS: 2 days  MDM: Patient is high risk for one or more organ failure.  They necessitate ongoing hospitalization for continued IV therapies and subsequent lab monitoring. Total time spent interpreting labs and vitals, reviewing the medical record, coordinating care amongst consultants and care team members, directly assessing and discussing care with the patient and/or family: 55 min  Safire Gordin, DO [facility]  To contact the attending physician between 7A-7P please use Epic Chat. To contact the covering physician during after hours 7P-7A, please review Amion.  05/10/2024, 2:27 PM   *This document has been created with the assistance of dictation software. Please excuse typographical errors. *

## 2024-05-10 NOTE — Plan of Care (Signed)

## 2024-05-10 NOTE — Plan of Care (Signed)
   Problem: Education: Goal: Knowledge of General Education information will improve Description Including pain rating scale, medication(s)/side effects and non-pharmacologic comfort measures Outcome: Progressing   Problem: Health Behavior/Discharge Planning: Goal: Ability to manage health-related needs will improve Outcome: Progressing

## 2024-05-10 NOTE — Care Management Important Message (Signed)
 Important Message  Patient Details  Name: Deborah Velazquez MRN: [id number] Date of Birth: 08-18-1946   Important Message Given:  Yes - Medicare IM     Anise Kerns 05/10/2024, 12:02 PM

## 2024-05-11 ENCOUNTER — Other Ambulatory Visit: Payer: Self-pay

## 2024-05-11 DIAGNOSIS — N12 Tubulo-interstitial nephritis, not specified as acute or chronic: Secondary | ICD-10-CM | POA: Diagnosis not present

## 2024-05-11 DIAGNOSIS — R7881 Bacteremia: Secondary | ICD-10-CM | POA: Diagnosis not present

## 2024-05-11 LAB — COMPREHENSIVE METABOLIC PANEL WITH GFR
ALT: <5 U/L (ref 0–44)
AST: 14 U/L — ABNORMAL LOW (ref 15–41)
Albumin: 2.6 g/dL — ABNORMAL LOW (ref 3.5–5.0)
Alkaline Phosphatase: 59 U/L (ref 38–126)
Anion gap: 8 (ref 5–15)
BUN: 12 mg/dL (ref 8–23)
CO2: 29 mmol/L (ref 22–32)
Calcium: 8.9 mg/dL (ref 8.9–10.3)
Chloride: 107 mmol/L (ref 98–111)
Creatinine, Ser: 0.57 mg/dL (ref 0.44–1.00)
GFR, Estimated: >60 mL/min (ref >60)
Glucose, Bld: 120 mg/dL — ABNORMAL HIGH (ref 70–99)
Potassium: 3.3 mmol/L — ABNORMAL LOW (ref 3.5–5.1)
Sodium: 144 mmol/L (ref 135–145)
Total Bilirubin: 0.5 mg/dL (ref 0.0–1.2)
Total Protein: 6.2 g/dL — ABNORMAL LOW (ref 6.5–8.1)

## 2024-05-11 LAB — CBC WITH DIFFERENTIAL/PLATELET
Abs Immature Granulocytes: 0.03 10*3/uL (ref 0.00–0.07)
Basophils Absolute: 0 10*3/uL (ref 0.0–0.1)
Basophils Relative: 0 %
Eosinophils Absolute: 0.2 10*3/uL (ref 0.0–0.5)
Eosinophils Relative: 2 %
HCT: 32.7 % — ABNORMAL LOW (ref 36.0–46.0)
Hemoglobin: 10.1 g/dL — ABNORMAL LOW (ref 12.0–15.0)
Immature Granulocytes: 0 %
Lymphocytes Relative: 34 %
Lymphs Abs: 2.8 10*3/uL (ref 0.7–4.0)
MCH: 26.5 pg (ref 26.0–34.0)
MCHC: 30.9 g/dL (ref 30.0–36.0)
MCV: 85.8 fL (ref 80.0–100.0)
Monocytes Absolute: 0.5 10*3/uL (ref 0.1–1.0)
Monocytes Relative: 6 %
Neutro Abs: 4.7 10*3/uL (ref 1.7–7.7)
Neutrophils Relative %: 58 %
Platelets: 331 10*3/uL (ref 150–400)
RBC: 3.81 MIL/uL — ABNORMAL LOW (ref 3.87–5.11)
RDW: 16.2 % — ABNORMAL HIGH (ref 11.5–15.5)
WBC: 8.3 10*3/uL (ref 4.0–10.5)
nRBC: 0 % (ref 0.0–0.2)

## 2024-05-11 LAB — CULTURE, BLOOD (ROUTINE X 2)

## 2024-05-11 LAB — MAGNESIUM: Magnesium: 2 mg/dL (ref 1.7–2.4)

## 2024-05-11 LAB — PHOSPHORUS: Phosphorus: 3.7 mg/dL (ref 2.5–4.6)

## 2024-05-11 MED ORDER — CEFADROXIL 500 MG PO CAPS
1000.0000 mg | ORAL_CAPSULE | Freq: Two times a day (BID) | ORAL | 0 refills | Status: AC
  Filled 2024-05-11: qty 40, 10d supply, fill #0

## 2024-05-11 MED ORDER — POTASSIUM CHLORIDE CRYS ER 20 MEQ PO TBCR
40.0000 meq | EXTENDED_RELEASE_TABLET | Freq: Once | ORAL | Status: AC
  Administered 2024-05-11: 40 meq via ORAL
  Filled 2024-05-11: qty 2

## 2024-05-11 MED ORDER — SODIUM CHLORIDE 0.9 % IV SOLN
2.0000 g | Freq: Once | INTRAVENOUS | Status: AC
  Administered 2024-05-11: 2 g via INTRAVENOUS
  Filled 2024-05-11: qty 20

## 2024-05-11 NOTE — Discharge Summary (Signed)
 Physician Discharge Summary   Patient: Deborah Velazquez MRN: [id number] DOB: 12-21-1945  Admit date:     05/08/2024  Discharge date: 05/11/24  Discharge Physician: Roise Cleaver   PCP: Thersia Flax, MD   Recommendations at discharge:   Follow-up outpatient with urology Follow-up primary care for chronic medication management  Discharge Diagnoses: Principal Problem:   Sepsis (HCC) Active Problems:   Pyelonephritis   Bacteremia  Resolved Problems:   * No resolved hospital problems. *  Hospital Course: Deborah Velazquez is a 78 y.o. female with medical history significant of Parkinson's disease, seizure disorder, Lewy body dementia, who presents to the emergency department on 6/13 with fever and strong smelling urine.  At that time patient was found to have mild hypokalemia, and urinalysis concerning for UTI.  Blood cultures were obtained during that visit.  Patient was sent with Augmentin .  On 6/14 blood cultures returned positive for gram-negative rods and patient was called to return to the ER. On return to the ED patient has no leukocytosis, labs notable for very mild hypokalemia of 3.4, hypoalbuminemia 3.1, and no other lab abnormalities.  Blood cultures have resulted positive for Klebsiella pneumonia.  CT renal stone protocol in the ED reveals chronic nephrolithiasis in the right kidney and mild left-sided hydronephrosis with left hydroureter and mild left pararenal and ureteral inflammatory stranding which does not appear significantly different from CT scan in February.  No obstructing calculus identified. Head CT also performed in the ED which reveals moderate to severe chronic small vessel ischemic disease and unchanged ventriculomegaly which may reflect central predominant cerebral atrophy or NPH. Patient was admitted for IV therapy.  She completed 5 days of IV ceftriaxone  while admitted.  There was discussion of OPAT however patient has agitation with IVs and pulled out her IV  while admitted.  Not ideal candidate for OPAT therapy.  Upon further discussion with infectious disease and pharmacy we have planned for a prolonged antibiotic course outpatient, will allow for appropriate coverage in the event that patient misses a dose or 2 due to her hypersomnolence.  Klebsiella bacteremia - Secondary to UTI, currently growing same. - Klebsiella sensitive to ceftriaxone .  Completed 5 days IV therapy.  At discharge: Cefadroxil 1000 mg po BID  to complete a 14-day course. Family is resistant to sending home with p.o. antibiotics at this time because they fear that they will not be able to consistently get her to take the pills. - Patient is not an excellent OPAT candidate given agitation with IV therapy while admitted and has pulled out her IV once already. - Have discussed above antibiotic plan with infectious disease - Repeat blood cultures from 6/14 are negative - Patient remains afebrile with no leukocytosis   Left-sided nephrolithiasis - Seen on CT on arrival.  Nonobstructive.  Chronic and largely unchanged since February 2024 - These are not currently problematic but may be contributing to recurrent UTIs  - Will refer to urology at discharge.  Have discussed this with the patient's HCPOA.   UTI - Urine culture positive for Proteus and Klebsiella. S/p adequate treatment with abx as above   Primary Parkinson's disease, advanced.  Has visual hallucinations - Resume home meds - At baseline patient requires 24/7 assistance, mostly bedbound, requires [facility] lift.  Caregiver reports her mental status waxes and wanes. - Patient's HCPOA endorses concern with recent coughing when eating.  SLP eval here indicates patient is currently at baseline and has no specifically skilled services required - Have ordered  to resume home health services at discharge.  Family not interested in skilled nursing facility - Continue with delirium precautions and frequent reorientation    Generalized debility - Patient is wheelchair-bound at baseline - 24/7 home health aide assistance. - Will resume home health services at discharge   Seizure disorder - Continue lamotrigine    Hypokalemia - replaced as needed     Consultants: ID Procedures performed:   Disposition: Home health Diet recommendation:  Discharge Diet Orders (From admission, onward)     Start     Ordered   05/11/24 0000  Diet general        05/11/24 1222           Regular diet DISCHARGE MEDICATION: Allergies as of 05/11/2024       Reactions   Pseudoephedrine Hcl Anxiety, Other (See Comments)   Nervous   Sudafed [pseudoephedrine] Anxiety, Other (See Comments)   Nervous        Medication List     STOP taking these medications    amoxicillin -clavulanate 875-125 MG tablet Commonly known as: AUGMENTIN    nystatin  powder Commonly known as: MYCOSTATIN /NYSTOP    ondansetron  4 MG disintegrating tablet Commonly known as: ZOFRAN -ODT   rivastigmine  1.5 MG capsule Commonly known as: EXELON        TAKE these medications    aspirin  EC 81 MG tablet Take 81 mg by mouth daily. Swallow whole.   atorvastatin  10 MG tablet Commonly known as: LIPITOR TAKE 1 TABLET(10 MG) BY MOUTH AT BEDTIME   carbidopa -levodopa  50-200 MG tablet Commonly known as: SINEMET  CR Take 1 tablet by mouth 5 (five) times daily. Patient takes at 0700,1030,1400,1730,2200   cefadroxil 500 MG capsule Commonly known as: DURICEF Take 2 capsules (1,000 mg total) by mouth 2 (two) times daily for 10 days.   DULoxetine  60 MG capsule Commonly known as: CYMBALTA  Take 60 mg by mouth daily.   feeding supplement Liqd Take 237 mLs by mouth 2 (two) times daily between meals.   lamoTRIgine  100 MG tablet Commonly known as: LAMICTAL  Take 1 tablet by mouth 2 (two) times daily.   pregabalin  150 MG capsule Commonly known as: LYRICA  Take 150 mg by mouth 2 (two) times daily.   QUEtiapine  25 MG tablet Commonly known as:  SEROQUEL  Take 0.5 tablets (12.5 mg total) by mouth at bedtime. What changed:  when to take this additional instructions   sertraline  100 MG tablet Commonly known as: ZOLOFT  Take 200 mg by mouth daily.        Discharge Exam: Filed Weights   05/08/24 1007  Weight: 68 kg   Constitutional:  Normal appearance. Non toxic-appearing.  HENT: Head Normocephalic and atraumatic.  Mucous membranes are moist.  Eyes:  Extraocular intact. Conjunctivae normal. Pupils are equal, round, and reactive to light.  Cardiovascular: Rate and Rhythm: Normal rate and regular rhythm.  Pulmonary: Non labored, symmetric rise of chest wall.  Skin: warm and dry. not jaundiced.  Neurological: Intermittently confused, oriented to self.  Follows all directions today Psychiatric: Mood and Affect congruent.   Condition at discharge: stable  The results of significant diagnostics from this hospitalization (including imaging, microbiology, ancillary and laboratory) are listed below for reference.   Imaging Studies: CT Renal Stone Study Result Date: 05/08/2024 CLINICAL DATA:  78 year old female recently diagnosed and treated for UTI, callback for positive blood cultures (gram-negative rods). Parkinson's disease, bed-bound. Code sepsis. EXAM: CT ABDOMEN AND PELVIS WITHOUT CONTRAST TECHNIQUE: Multidetector CT imaging of the abdomen and pelvis was performed following the standard protocol  without IV contrast. RADIATION DOSE REDUCTION: This exam was performed according to the departmental dose-optimization program which includes automated exposure control, adjustment of the mA and/or kV according to patient size and/or use of iterative reconstruction technique. COMPARISON:  CT Abdomen and Pelvis with contrast 01/09/2024. FINDINGS: Lower chest: Stable heart size. Upper limits of normal to mildly enlarged. No pericardial effusion. Right greater than left dependent lung base opacity unchanged since February and most compatible  with atelectasis. Hepatobiliary: Negative noncontrast liver and gallbladder. Pancreas: Negative. Spleen: Negative. Adrenals/Urinary Tract: Adrenal glands remain within normal limits. Chronic nephrolithiasis, regressed in the right kidney since February, stable and the left lower pole since that time. No right hydronephrosis and diminutive right ureter. Some streak artifact in the pelvis related to left hip arthroplasty. The left ureter and left renal collecting system are asymmetrically larger, although similar to the February CT. Urothelial thickening at that time persists. And there is mild left perinephric, periureteral inflammatory stranding also not significantly changed. No left ureteral calculus or obstructing etiology can be identified. Unremarkable urinary bladder. Chronic pelvic phleboliths. Stomach/Bowel: Moderate volume retained stool throughout the large bowel which is nondilated. Fluid in the proximal ascending colon. Gas distended but nondilated proximal cecum which ends on a lax mesentery in the anterior mid abdomen. Appendix not identified. Fluid containing but nondilated small bowel loops. Stomach and duodenum mostly decompressed. No pneumoperitoneum. No free fluid. No mesenteric inflammation identified. Vascular/Lymphatic: Aortoiliac calcified atherosclerosis. Vascular patency is not evaluated in the absence of IV contrast. Normal caliber abdominal aorta. No lymphadenopathy identified. Reproductive: Stable, negative. Other: No pelvis free fluid. Musculoskeletal: Chronic lower lumbar fusion hardware. Osteopenia. Chronic left hip arthroplasty. No acute osseous abnormality identified. IMPRESSION: 1. Chronic nephrolithiasis, regressed in the right kidney since February. But ongoing Left side mild hydronephrosis, left hydroureter, mild left pararenal and ureteral inflammatory stranding - which do not appear significantly different from the February CT. No obstructing calculus or obstructing etiology is  identified. Consider acute or chronic ascending left urinary infection. 2. No other acute or inflammatory process identified in the noncontrast abdomen or pelvis. 3.  Aortic Atherosclerosis (ICD10-I70.0). Electronically Signed   By: Marlise Simpers M.D.   On: 05/08/2024 11:19   CT HEAD WO CONTRAST ( ) Result Date: 05/08/2024 CLINICAL DATA:  Head trauma, minor (Age >= 65y). Altered mental status. EXAM: CT HEAD WITHOUT CONTRAST TECHNIQUE: Contiguous axial images were obtained from the base of the skull through the vertex without intravenous contrast. RADIATION DOSE REDUCTION: This exam was performed according to the departmental dose-optimization program which includes automated exposure control, adjustment of the mA and/or kV according to patient size and/or use of iterative reconstruction technique. COMPARISON:  Head CT 05/07/2023 FINDINGS: Brain: There is no evidence of an acute infarct, intracranial hemorrhage, mass, midline shift, or extra-axial fluid collection. Patchy to confluent hypodensities in the cerebral white matter are similar to the prior CT and are nonspecific but compatible with moderate to severe chronic small vessel ischemic disease. Chronic lacunar infarcts are present in the deep gray nuclei/deep white matter. Ventriculomegaly is unchanged and is again noted to be out of proportion to the size of the sulci. Vascular: No hyperdense vessel. Skull: No fracture or suspicious lesion. Sinuses/Orbits: Minimal mucosal thickening in the paranasal sinuses. Clear mastoid air cells. Bilateral cataract extraction. Other: None. IMPRESSION: 1. No evidence of acute intracranial abnormality. 2. Moderate to severe chronic small vessel ischemic disease. 3. Unchanged ventriculomegaly which may reflect central predominant cerebral atrophy or normal pressure hydrocephalus in the appropriate  clinical setting. Electronically Signed   By: Aundra Lee M.D.   On: 05/08/2024 11:11   DG Chest Portable 1 View Result Date:  05/07/2024 CLINICAL DATA:  Fever EXAM: PORTABLE CHEST 1 VIEW COMPARISON:  01/09/2024 FINDINGS: Low lung volumes with exaggerated cardiomediastinal silhouette. No consolidation, pleural effusion or pneumothorax. IMPRESSION: Low lung volumes.  No focal airspace opacity. Electronically Signed   By: Esmeralda Hedge M.D.   On: 05/07/2024 16:41    Microbiology: Results for orders placed or performed during the hospital encounter of 05/08/24  Resp panel by RT-PCR (RSV, Flu A&B, Covid) Anterior Nasal Swab     Status: None   Collection Time: 05/08/24 10:24 AM   Specimen: Anterior Nasal Swab  Result Value Ref Range Status   SARS Coronavirus 2 by RT PCR NEGATIVE NEGATIVE Final    Comment: (NOTE) SARS-CoV-2 target nucleic acids are NOT DETECTED.  The SARS-CoV-2 RNA is generally detectable in upper respiratory specimens during the acute phase of infection. The lowest concentration of SARS-CoV-2 viral copies this assay can detect is 138 copies/mL. A negative result does not preclude SARS-Cov-2 infection and should not be used as the sole basis for treatment or other patient management decisions. A negative result may occur with  improper specimen collection/handling, submission of specimen other than nasopharyngeal swab, presence of viral mutation(s) within the areas targeted by this assay, and inadequate number of viral copies(<138 copies/mL). A negative result must be combined with clinical observations, patient history, and epidemiological information. The expected result is Negative.  Fact Sheet for Patients:  BloggerCourse.com  Fact Sheet for Healthcare Providers:  SeriousBroker.it  This test is no t yet approved or cleared by the [place]  FDA and  has been authorized for detection and/or diagnosis of SARS-CoV-2 by FDA under an Emergency Use Authorization (EUA). This EUA will remain  in effect (meaning this test can be used) for the  duration of the COVID-19 declaration under Section 564(b)(1) of the Act, 21 U.S.C.section 360bbb-3(b)(1), unless the authorization is terminated  or revoked sooner.       Influenza A by PCR NEGATIVE NEGATIVE Final   Influenza B by PCR NEGATIVE NEGATIVE Final    Comment: (NOTE) The Xpert Xpress SARS-CoV-2/FLU/RSV plus assay is intended as an aid in the diagnosis of influenza from Nasopharyngeal swab specimens and should not be used as a sole basis for treatment. Nasal washings and aspirates are unacceptable for Xpert Xpress SARS-CoV-2/FLU/RSV testing.  Fact Sheet for Patients: BloggerCourse.com  Fact Sheet for Healthcare Providers: SeriousBroker.it  This test is not yet approved or cleared by the [place]  FDA and has been authorized for detection and/or diagnosis of SARS-CoV-2 by FDA under an Emergency Use Authorization (EUA). This EUA will remain in effect (meaning this test can be used) for the duration of the COVID-19 declaration under Section 564(b)(1) of the Act, 21 U.S.C. section 360bbb-3(b)(1), unless the authorization is terminated or revoked.     Resp Syncytial Virus by PCR NEGATIVE NEGATIVE Final    Comment: (NOTE) Fact Sheet for Patients: BloggerCourse.com  Fact Sheet for Healthcare Providers: SeriousBroker.it  This test is not yet approved or cleared by the [place]  FDA and has been authorized for detection and/or diagnosis of SARS-CoV-2 by FDA under an Emergency Use Authorization (EUA). This EUA will remain in effect (meaning this test can be used) for the duration of the COVID-19 declaration under Section 564(b)(1) of the Act, 21 U.S.C. section 360bbb-3(b)(1), unless the authorization is terminated or revoked.  Performed at [facility], [address] [place], [place] [address]   Blood Culture (routine x 2)     Status: None (Preliminary  result)   Collection Time: 05/08/24 10:24 AM   Specimen: BLOOD  Result Value Ref Range Status   Specimen Description BLOOD BLOOD RIGHT FOREARM  Final   Special Requests   Final    BOTTLES DRAWN AEROBIC AND ANAEROBIC Blood Culture adequate volume   Culture   Final    NO GROWTH 3 DAYS Performed at [facility], [address] [place], [place] [address]    Report Status PENDING  Incomplete  Blood Culture (routine x 2)     Status: None (Preliminary result)   Collection Time: 05/08/24 10:24 AM   Specimen: BLOOD  Result Value Ref Range Status   Specimen Description BLOOD LEFT ANTECUBITAL  Final   Special Requests   Final    BOTTLES DRAWN AEROBIC AND ANAEROBIC Blood Culture adequate volume   Culture   Final    NO GROWTH 3 DAYS Performed at [facility], [address] [place], [place] [address]    Report Status PENDING  Incomplete    Labs: CBC: Recent Labs  Lab 05/07/24 1618 05/08/24 1024 05/09/24 0614 05/10/24 0437 05/11/24 0503  WBC 9.5 8.7 7.5 6.0 8.3  NEUTROABS 7.4 6.7 5.1 3.3 4.7  HGB 10.3* 10.7* 10.3* 10.6* 10.1*  HCT 32.3* 33.1* 32.4* 33.7* 32.7*  MCV 87.1 85.3 84.2 85.5 85.8  PLT 264 265 280 302 331   Basic Metabolic Panel: Recent Labs  Lab 05/07/24 1618 05/08/24 1024 05/09/24 0614 05/10/24 0437 05/11/24 0503  NA 137 140 140 145 144  K 3.2* 3.4* 3.3* 3.6 3.3*  CL 103 107 107 111 107  CO2 25 23 23 25 29   GLUCOSE 162* 129* 114* 95 120*  BUN 11 8 5* 6* 12  CREATININE 0.69 0.50 0.57 0.53 0.57  CALCIUM  8.2* 8.9 8.7* 9.1 8.9  MG  --   --  2.0 2.2 2.0  PHOS  --   --  2.8 4.2 3.7   Liver Function Tests: Recent Labs  Lab 05/07/24 1618 05/08/24 1024 05/09/24 0614 05/10/24 0437 05/11/24 0503  AST 18 14* 17 14* 14*  ALT 7 <5 7 <5 <5  ALKPHOS 60 64 56 59 59  BILITOT 0.5 0.8 0.8 0.7 0.5  PROT 6.5 7.2 6.5 6.8 6.2*  ALBUMIN  2.8* 3.1* 2.9* 3.0* 2.6*   CBG: No results for input(s): GLUCAP in the last 168 hours.  Discharge time  spent: 32 minutes.  Signed: Compton Brigance, DO [facility] 05/11/2024

## 2024-05-11 NOTE — Plan of Care (Signed)

## 2024-05-11 NOTE — TOC Transition Note (Signed)
 Transition of Care [facility]) - Discharge Note   Patient Details  Name: Deborah Velazquez MRN: [id number] Date of Birth: 1946/06/15  Transition of [facility] CM/SW Contact:  Odilia Bennett, LCSW Phone Number: 05/11/2024, 1:09 PM   Clinical Narrative:   Patient has orders to discharge home today. [facility] liaison is aware. [facility] is unable to transport. Set up [facility] for around 2:30. HCPOA is aware. No further concerns. CSW signing off.  Final next level of care: Home w Home Health Services Barriers to Discharge: Continued Medical Work up   Patient Goals and CMS Choice            Discharge Placement                Patient to be transferred to facility by: [facility] Name of family member notified: Sierra Dresser Patient and family notified of of transfer: 05/11/24  Discharge Plan and Services Additional resources added to the After Visit Summary for                            [facility] Arranged: PT, OT, Nurse's Aide HH Agency: [facility] ([facility] Date HH Agency Contacted: 05/11/24   Representative spoke with at [facility] Agency: Shaun  Social Drivers of Health (SDOH) Interventions SDOH Screenings   Food Insecurity: No Food Insecurity (05/08/2024)  Housing: Patient Declined (05/08/2024)  Transportation Needs: No Transportation Needs (05/08/2024)  Utilities: Not At Risk (05/08/2024)  Alcohol Screen: Low Risk  (11/11/2023)  Depression (PHQ2-9): High Risk (11/11/2023)  Financial Resource Strain: Low Risk  (11/11/2023)  Physical Activity: Inactive (11/11/2023)  Social Connections: Patient Declined (05/08/2024)  Stress: No Stress Concern Present (11/11/2023)  Tobacco Use: Low Risk  (05/08/2024)  Health Literacy: Inadequate Health Literacy (11/11/2023)     Readmission Risk Interventions     No data to display

## 2024-05-12 ENCOUNTER — Telehealth: Payer: Self-pay

## 2024-05-12 NOTE — Transitions of Care (Post Inpatient/ED Visit) (Signed)
 05/12/2024  Name: Deborah Velazquez MRN: [id number] DOB: May 16, 1946  Today's TOC FU Call Status: Today's TOC FU Call Status:: Successful TOC FU Call Completed TOC FU Call Complete Date: 05/12/24 Patient's Name and Date of Birth confirmed.  Transition Care Management Follow-up Telephone Call Date of Discharge: 05/11/24 Discharge Facility: [facility]) Type of Discharge: Inpatient Admission Primary Inpatient Discharge Diagnosis:: Sepsis How have you been since you were released from the hospital?: Better Any questions or concerns?: No  Items Reviewed: Did you receive and understand the discharge instructions provided?: Yes Medications obtained,verified, and reconciled?: Yes (Medications Reviewed) Any new allergies since your discharge?: No Dietary orders reviewed?: Yes Type of Diet Ordered:: Low sodium heart healthy Do you have support at home?: Yes People in Home [RPT]: spouse Name of Support/Comfort Primary Source: Paid caregivers  Medications Reviewed Today: Medications Reviewed Today     Reviewed by Claudene Crystal, RN (Case Manager) on 05/12/24 at 1120  Med List Status: <None>   Medication Order Taking? Sig Documenting Provider Last Dose Status Informant  aspirin  EC 81 MG tablet [id number]  Take 81 mg by mouth daily. Swallow whole. [provider]  Active Family Member  atorvastatin  (LIPITOR) 10 MG tablet [id number]  TAKE 1 TABLET(10 MG) BY MOUTH AT BEDTIME Tullo, Teresa L, MD  Active Family Member  carbidopa -levodopa  (SINEMET  CR) 50-200 MG tablet [id number]  Take 1 tablet by mouth 5 (five) times daily. Patient takes at 0700,1030,1400,1730,2200 [provider]  Active Family Member  cefadroxil (DURICEF) 500 MG capsule [id number]  Take 2 capsules (1,000 mg total) by mouth 2 (two) times daily for 10 days. Dezii, Alexandra, DO  Active   DULoxetine  (CYMBALTA ) 60 MG capsule [id number]  Take 60 mg by mouth daily.  [provider]   Active Family Member  feeding supplement (ENSURE ENLIVE / ENSURE PLUS) LIQD [id number]  Take 237 mLs by mouth 2 (two) times daily between meals. Montey Apa, DO  Active Family Member  lamoTRIgine  (LAMICTAL ) 100 MG tablet [id number]  Take 1 tablet by mouth 2 (two) times daily. [provider]  Active Family Member  pregabalin  (LYRICA ) 150 MG capsule [id number]  Take 150 mg by mouth 2 (two) times daily.  [provider]  Active Family Member  QUEtiapine  (SEROQUEL ) 25 MG tablet [id number]  Take 0.5 tablets (12.5 mg total) by mouth at bedtime.  Patient taking differently: Take 12.5 mg by mouth 2 (two) times daily. 12.5mg  in Am and 25mg  PM   Verla Glaze, MD  Active Family Member  sertraline  (ZOLOFT ) 100 MG tablet [id number]  Take 200 mg by mouth daily.  [provider]  Active Family Member            Home Care and Equipment/Supplies: Were Home Health Services Ordered?: Yes Name of Home Health Agency:: [facility] Has Agency set up a time to come to your home?: No EMR reviewed for Home Health Orders: Orders present/patient has not received call (refer to CM for follow-up) (It is a resumption of care) Any new equipment or medical supplies ordered?: No  Functional Questionnaire: Do you need assistance with bathing/showering or dressing?: Yes (Requires assistance with one person and equipment) Do you need assistance with meal preparation?: Yes Do you need assistance with eating?: Yes (At times) Do you have difficulty maintaining continence: Yes (Incontinent of urine) Do you need assistance with getting out of bed/getting out of a chair/moving?: Yes (Requires assistance with one person and equipment) Do you have difficulty managing or  taking your medications?: Yes (Dependent on family and caregivers)  Follow up appointments reviewed: PCP Follow-up appointment confirmed?: No (The caregiver is waiting on a new wheelchair prior to scheduling an appointment) MD  Provider Line [phone] Given: No Specialist Hospital Follow-up appointment confirmed?: NA Do you need transportation to your follow-up appointment?: No (Uses [facility]) Do you understand care options if your condition(s) worsen?: Yes-patient verbalized understanding  SDOH Interventions Today    Flowsheet Row Most Recent Value  SDOH Interventions   Food Insecurity Interventions Intervention Not Indicated  Housing Interventions Intervention Not Indicated  Transportation Interventions Intervention Not Indicated  Utilities Interventions Intervention Not Indicated    Gareld June, BSN, RN [place]  VBCI - Population Health RN Care Manager [phone]

## 2024-05-15 DIAGNOSIS — J9601 Acute respiratory failure with hypoxia: Secondary | ICD-10-CM | POA: Diagnosis not present

## 2024-05-15 DIAGNOSIS — G20A1 Parkinson's disease without dyskinesia, without mention of fluctuations: Secondary | ICD-10-CM | POA: Diagnosis not present

## 2024-05-18 ENCOUNTER — Telehealth: Payer: Self-pay

## 2024-05-18 NOTE — Telephone Encounter (Signed)
 Verbal order given

## 2024-05-18 NOTE — Telephone Encounter (Signed)
 Copied from CRM [id number]. Topic: Clinical - Home Health Verbal Orders >> May 18, 2024  8:39 AM Ernestene SQUIBB wrote: Caller/Agency: [facility] Callback Number: [phone] Service Requested: Re certification visit  Frequency: 1 week 1 Any new concerns about the patient? No

## 2024-05-19 LAB — CULTURE, BLOOD (ROUTINE X 2)
Culture: NO GROWTH
Culture: NO GROWTH
Special Requests: ADEQUATE
Special Requests: ADEQUATE

## 2024-06-14 DIAGNOSIS — G20A1 Parkinson's disease without dyskinesia, without mention of fluctuations: Secondary | ICD-10-CM | POA: Diagnosis not present

## 2024-06-14 DIAGNOSIS — J9601 Acute respiratory failure with hypoxia: Secondary | ICD-10-CM | POA: Diagnosis not present

## 2024-06-18 ENCOUNTER — Telehealth

## 2024-06-18 ENCOUNTER — Other Ambulatory Visit: Payer: Self-pay

## 2024-06-18 ENCOUNTER — Telehealth: Admitting: Family Medicine

## 2024-06-18 ENCOUNTER — Ambulatory Visit: Payer: Self-pay

## 2024-06-18 DIAGNOSIS — U071 COVID-19: Secondary | ICD-10-CM | POA: Diagnosis not present

## 2024-06-18 MED ORDER — MOLNUPIRAVIR EUA 200MG CAPSULE
4.0000 | ORAL_CAPSULE | Freq: Two times a day (BID) | ORAL | 0 refills | Status: AC
  Filled 2024-06-18: qty 40, 5d supply, fill #0

## 2024-06-18 NOTE — Patient Instructions (Signed)
 COVID-19: What to Know COVID-19 is an infection caused by a virus called SARS-CoV-2. This type of virus is called a coronavirus. People with COVID-19 may: Have few to no symptoms. Have mild to moderate symptoms that affect their lungs and breathing. Get very sick. What are the causes?  COVID-19 is caused by a virus. This virus may be in the air as droplets or on surfaces. It can spread from an infected person when they cough, sneeze, speak, sing, or breathe. You may become infected if: You breathe in the infected droplets in the air. You touch an object that has the virus on it. What increases the risk? You are at risk of getting COVID-19 if you have been around someone with the infection. You may be more likely to get very sick if: You are 5 years old or older. You have certain medical conditions, such as: Heart disease. Diabetes. Long-term respiratory disease. Cancer. Pregnancy. You are immunocompromised. This means your body can't fight infections easily. You have a disability that makes it hard for you to move around, you have trouble moving, or you can't move at all. What are the signs or symptoms? People may have different symptoms from COVID-19. The symptoms can also be mild to very bad. They often show up in 5-6 days after being infected. But, they can take up to 14 days to appear. Common symptoms are: Cough. Feeling tired. New loss of taste or smell. Fever. Less common symptoms are: Sore throat. Headache. Body or muscle aches. Diarrhea. A skin rash or fingers or toes that are a different color than usual. Red or irritated eyes. Sometimes, COVID-19 does not cause symptoms. How is this diagnosed? COVID-19 can be diagnosed with tests done in the lab or at home. Fluid from your nose, mouth, or lungs will be used to check for the virus. How is this treated? Treatment for COVID-19 depends on how sick you are. Mild symptoms can be treated at home with rest, fluids, and  over-the-counter medicines. very bad symptoms may be treated in a hospital intensive care unit (ICU). If you have symptoms and are at risk of getting very sick, you may be given a medicine that fights viruses. This medicine is called an antiviral. How is this prevented? To protect yourself from COVID-19: Know your risk factors. Get vaccinated. If your body can't fight infections easily, talk to your provider about treatment to help prevent COVID-19. Stay at least about 3 feet (1 meter) away from other people. Wear mask that fits well when: You can't stay at a distance from people. You're in a place with not a lot of air flow. Try to be in open spaces with good air flow when you are in public. Wash your hands often or use an alcohol-based hand sanitizer. Cover your nose and mouth when you cough or sneeze. If you think you have COVID-19 or have been around someone who has it, stay home and away from other people as told by your provider or health officials. Where to find more information To learn more: Go to [employer] Click Health Topics. Type COVID-19 in the search box. Go to VisitDestination.com.br Click Health Topics. Then click All Topics. Type COVID-19 in the search box. Get help right away if: You have trouble breathing or get short of breath. You have pain or pressure in your chest. You're feeling confused. These symptoms may be an emergency. Get help right away. Call 911. Do not wait to see if the symptoms will go away.  Do not drive yourself to the hospital. This information is not intended to replace advice given to you by your health care provider. Make sure you discuss any questions you have with your health care provider. Document Revised: 08/14/2023 Document Reviewed: 08/06/2023 Elsevier Patient Education  2025 [employer].

## 2024-06-18 NOTE — Telephone Encounter (Signed)
 FYI Only or Action Required?: FYI only for provider.  Patient was last seen in primary care on 06/04/2023 by Marylynn Verneita CROME, MD.  Called Nurse Triage reporting Covid Positive.  Symptoms began several days ago.  Interventions attempted: Rest, hydration, or home remedies.  Symptoms are: gradually improving.  Triage Disposition: Home Care  Patient/caregiver understands and will follow disposition?: Yes      Copied from CRM [id number]. Topic: Clinical - Red Word Triage >> Jun 18, 2024 12:41 PM Franky GRADE wrote: Red Word that prompted transfer to Nurse Triage: [name] a care giver for the patient is calling because the patient was exposed to someone with Covid and the patient actually tested positive today and starting to develop symptoms, She has chills, cough, congestion, loss of appetite and not wanting to drink anything. She has parkinson's and is unable to come to the clinic. Reason for Disposition  [1] COVID-19 diagnosed by positive lab test (e.g., PCR, rapid self-test kit) AND [2] mild symptoms (e.g., cough, fever, others) AND [3] no complications or SOB  Answer Assessment - Initial Assessment Questions 1. COVID-19 DIAGNOSIS: How do you know that you have COVID? (e.g., positive lab test or self-test, diagnosed by doctor or NP/PA, symptoms after exposure).     First test was negative but today test was positive 2. COVID-19 EXPOSURE: Was there any known exposure to COVID before the symptoms began? CDC Definition of close contact: within 6 feet (2 meters) for a total of 15 minutes or more over a 24-hour period.      Yes about 10 days ago 3. ONSET: When did the COVID-19 symptoms start?      Cough started Saturday 4. WORST SYMPTOM: What is your worst symptom? (e.g., cough, fever, shortness of breath, muscle aches)     cough 5. COUGH: Do you have a cough? If Yes, ask: How bad is the cough?      Yes and productive 6. FEVER: Do you have a fever? If Yes, ask: What is your  temperature, how was it measured, and when did it start?     No fever  7. RESPIRATORY STATUS: Describe your breathing? (e.g., normal; shortness of breath, wheezing, unable to speak)      On home o2 while sleeping,  8. BETTER-SAME-WORSE: Are you getting better, staying the same or getting worse compared to yesterday?  If getting worse, ask, In what way?     Somewhat better 9. OTHER SYMPTOMS: Do you have any other symptoms?  (e.g., chills, fatigue, headache, loss of smell or taste, muscle pain, sore throat)     Sore throat, chills  10. HIGH RISK DISEASE: Do you have any chronic medical problems? (e.g., asthma, heart or lung disease, weak immune system, obesity, etc.)       dementia 11. VACCINE: Have you had the COVID-19 vaccine? If Yes, ask: Which one, how many shots, when did you get it?       Yes was vaccination  13. O2 SATURATION MONITOR:  Do you use an oxygen  saturation monitor (pulse oximeter) at home? If Yes, ask What is your reading (oxygen  level) today? What is your usual oxygen  saturation reading? (e.g., 95%)       Has been within her regular limits  Protocols used: Coronavirus (COVID-19) Diagnosed or Suspected-A-AH

## 2024-06-18 NOTE — Progress Notes (Signed)
 Virtual Visit Consent   Deborah Velazquez, you are scheduled for a virtual visit with a [facility] provider today. Just as with appointments in the office, your consent must be obtained to participate. Your consent will be active for this visit and any virtual visit you may have with one of our providers in the next 365 days. If you have a MyChart account, a copy of this consent can be sent to you electronically.  As this is a virtual visit, video technology does not allow for your provider to perform a traditional examination. This may limit your provider's ability to fully assess your condition. If your provider identifies any concerns that need to be evaluated in person or the need to arrange testing (such as labs, EKG, etc.), we will make arrangements to do so. Although advances in technology are sophisticated, we cannot ensure that it will always work on either your end or our end. If the connection with a video visit is poor, the visit may have to be switched to a telephone visit. With either a video or telephone visit, we are not always able to ensure that we have a secure connection.  By engaging in this virtual visit, you consent to the provision of healthcare and authorize for your insurance to be billed (if applicable) for the services provided during this visit. Depending on your insurance coverage, you may receive a charge related to this service.  I need to obtain your verbal consent now. Are you willing to proceed with your visit today? Norma G Tanimoto has provided verbal consent on 06/18/2024 for a virtual visit (video or telephone). Loa Lamp, FNP  Date: 06/18/2024 3:43 PM   Virtual Visit via Video Note   I, Loa Lamp, connected with  [name]  ([id number], 1946-04-06) on 06/18/24 at  3:45 PM EDT by a video-enabled telemedicine application and verified that I am speaking with the correct person using two identifiers.  Location: Patient: Virtual Visit Location Patient:  Home Provider: Virtual Visit Location Provider: Home Office   I discussed the limitations of evaluation and management by telemedicine and the availability of in person appointments. The patient expressed understanding and agreed to proceed.    History of Present Illness: Deborah Velazquez is a 78 y.o. who identifies as a female who was assigned female at birth, and is being seen today for covid positive test today. Sx include no fever, sore throat, hoarse, coughing up a little mucus. On 2 liters oxygen  at night. She has caregivers 24-7. She has parkinsons with Lewey Body dementia. Her daughter is with her. She took molnupirivir in 2022 without problems.   HPI: HPI  Problems:  Patient Active Problem List   Diagnosis Date Noted   Pyelonephritis 05/09/2024   Bacteremia 05/09/2024   Sepsis (HCC) 05/08/2024   Sepsis due to urinary tract infection (HCC) 01/10/2024   Intra-abdominal and pelvic swelling, mass and lump, unspecified site 06/04/2023   Closed fracture of neck of left femur (HCC) 05/07/2023   Closed right hip fracture (HCC) 05/07/2023   Hypokalemia 03/20/2023   History of TIA (transient ischemic attack) 03/19/2023   Lewy body dementia (HCC) 03/17/2023   Aspiration pneumonia of right lower lobe (HCC) 03/17/2023   Overweight (BMI 25.0-29.9) 03/17/2023   Candidiasis of skin 01/06/2021   Lethargy 10/25/2020   Encounter for preventive health examination 07/01/2020   Proximal leg weakness 07/01/2020   Allergic rhinitis due to pollen 03/06/2019   Atrophic vaginitis 11/25/2018   Spondylolisthesis of lumbosacral  region 10/07/2018   Preoperative clearance 09/13/2018   Neuropathy 07/13/2018   Cognitive impairment 09/22/2017   Parkinson's disease (HCC) 08/19/2017   Prediabetes 07/26/2016   Pure hypercholesterolemia 12/23/2014   Anxiety 06/24/2014    Allergies:  Allergies  Allergen Reactions   Pseudoephedrine Hcl Anxiety and Other (See Comments)    Nervous   Sudafed  [Pseudoephedrine] Anxiety and Other (See Comments)    Nervous   Medications:  Current Outpatient Medications:    aspirin  EC 81 MG tablet, Take 81 mg by mouth daily. Swallow whole., Disp: , Rfl:    atorvastatin  (LIPITOR) 10 MG tablet, TAKE 1 TABLET(10 MG) BY MOUTH AT BEDTIME, Disp: 90 tablet, Rfl: 3   carbidopa -levodopa  (SINEMET  CR) 50-200 MG tablet, Take 1 tablet by mouth 5 (five) times daily. Patient takes at 0700,1030,1400,1730,2200, Disp: , Rfl:    DULoxetine  (CYMBALTA ) 60 MG capsule, Take 60 mg by mouth daily. , Disp: , Rfl:    feeding supplement (ENSURE ENLIVE / ENSURE PLUS) LIQD, Take 237 mLs by mouth 2 (two) times daily between meals., Disp: , Rfl:    lamoTRIgine  (LAMICTAL ) 100 MG tablet, Take 1 tablet by mouth 2 (two) times daily., Disp: , Rfl:    OXYGEN , Inhale 2 L/min into the lungs at bedtime., Disp: , Rfl:    pregabalin  (LYRICA ) 150 MG capsule, Take 150 mg by mouth 2 (two) times daily. , Disp: , Rfl:    QUEtiapine  (SEROQUEL ) 25 MG tablet, Take 0.5 tablets (12.5 mg total) by mouth at bedtime. (Patient taking differently: Take 12.5 mg by mouth 2 (two) times daily. 12.5mg  in Am and 25mg  PM), Disp: , Rfl:    sertraline  (ZOLOFT ) 100 MG tablet, Take 200 mg by mouth daily. , Disp: , Rfl:   Observations/Objective: Patient is well-developed, well-nourished in no acute distress.  Resting comfortably  at home.  Head is normocephalic, atraumatic.  No labored breathing.  Speech is clear and coherent with logical content.  Patient is alert and oriented at baseline.  In bed in no distress.   Assessment and Plan: There are no diagnoses linked to this encounter. Increase fluids, humidifier at night tylenol  or ibuprofen as directed, VIT d and zinc , rest, quarantine, ED if sx worsen.   Follow Up Instructions: I discussed the assessment and treatment plan with the patient. The patient was provided an opportunity to ask questions and all were answered. The patient agreed with the plan and  demonstrated an understanding of the instructions.  A copy of instructions were sent to the patient via MyChart unless otherwise noted below.     The patient was advised to call back or seek an in-person evaluation if the symptoms worsen or if the condition fails to improve as anticipated.    Saloni Lablanc, FNP

## 2024-06-18 NOTE — Telephone Encounter (Signed)
 fyi

## 2024-06-21 ENCOUNTER — Other Ambulatory Visit: Payer: Self-pay

## 2024-06-24 DIAGNOSIS — G20A1 Parkinson's disease without dyskinesia, without mention of fluctuations: Secondary | ICD-10-CM | POA: Diagnosis not present

## 2024-07-12 ENCOUNTER — Ambulatory Visit: Admitting: Urology

## 2024-07-15 DIAGNOSIS — G20A1 Parkinson's disease without dyskinesia, without mention of fluctuations: Secondary | ICD-10-CM | POA: Diagnosis not present

## 2024-07-15 DIAGNOSIS — J9601 Acute respiratory failure with hypoxia: Secondary | ICD-10-CM | POA: Diagnosis not present

## 2024-07-19 ENCOUNTER — Other Ambulatory Visit: Payer: Self-pay

## 2024-07-19 ENCOUNTER — Emergency Department

## 2024-07-19 ENCOUNTER — Inpatient Hospital Stay
Admission: EM | Admit: 2024-07-19 | Discharge: 2024-07-27 | DRG: 689 | Disposition: A | Discharge status: Hospice | Attending: Internal Medicine | Admitting: Internal Medicine

## 2024-07-19 DIAGNOSIS — I10 Essential (primary) hypertension: Secondary | ICD-10-CM | POA: Diagnosis present

## 2024-07-19 DIAGNOSIS — Z8249 Family history of ischemic heart disease and other diseases of the circulatory system: Secondary | ICD-10-CM

## 2024-07-19 DIAGNOSIS — Z1152 Encounter for screening for COVID-19: Secondary | ICD-10-CM

## 2024-07-19 DIAGNOSIS — E876 Hypokalemia: Secondary | ICD-10-CM | POA: Diagnosis not present

## 2024-07-19 DIAGNOSIS — Z789 Other specified health status: Secondary | ICD-10-CM | POA: Diagnosis not present

## 2024-07-19 DIAGNOSIS — F32A Depression, unspecified: Secondary | ICD-10-CM | POA: Diagnosis present

## 2024-07-19 DIAGNOSIS — I440 Atrioventricular block, first degree: Secondary | ICD-10-CM | POA: Diagnosis present

## 2024-07-19 DIAGNOSIS — R197 Diarrhea, unspecified: Secondary | ICD-10-CM | POA: Diagnosis not present

## 2024-07-19 DIAGNOSIS — G9340 Encephalopathy, unspecified: Secondary | ICD-10-CM | POA: Diagnosis not present

## 2024-07-19 DIAGNOSIS — F0283 Dementia in other diseases classified elsewhere, unspecified severity, with mood disturbance: Secondary | ICD-10-CM | POA: Diagnosis present

## 2024-07-19 DIAGNOSIS — K580 Irritable bowel syndrome with diarrhea: Secondary | ICD-10-CM | POA: Diagnosis present

## 2024-07-19 DIAGNOSIS — G20A1 Parkinson's disease without dyskinesia, without mention of fluctuations: Secondary | ICD-10-CM | POA: Diagnosis not present

## 2024-07-19 DIAGNOSIS — F419 Anxiety disorder, unspecified: Secondary | ICD-10-CM | POA: Diagnosis present

## 2024-07-19 DIAGNOSIS — E7800 Pure hypercholesterolemia, unspecified: Secondary | ICD-10-CM | POA: Diagnosis present

## 2024-07-19 DIAGNOSIS — N3000 Acute cystitis without hematuria: Principal | ICD-10-CM

## 2024-07-19 DIAGNOSIS — G40909 Epilepsy, unspecified, not intractable, without status epilepticus: Secondary | ICD-10-CM | POA: Diagnosis present

## 2024-07-19 DIAGNOSIS — R1310 Dysphagia, unspecified: Secondary | ICD-10-CM | POA: Diagnosis present

## 2024-07-19 DIAGNOSIS — I6782 Cerebral ischemia: Secondary | ICD-10-CM | POA: Diagnosis not present

## 2024-07-19 DIAGNOSIS — G3183 Dementia with Lewy bodies: Secondary | ICD-10-CM | POA: Diagnosis present

## 2024-07-19 DIAGNOSIS — Z96642 Presence of left artificial hip joint: Secondary | ICD-10-CM | POA: Diagnosis present

## 2024-07-19 DIAGNOSIS — N390 Urinary tract infection, site not specified: Principal | ICD-10-CM | POA: Diagnosis present

## 2024-07-19 DIAGNOSIS — G9341 Metabolic encephalopathy: Secondary | ICD-10-CM | POA: Diagnosis present

## 2024-07-19 DIAGNOSIS — Z820 Family history of epilepsy and other diseases of the nervous system: Secondary | ICD-10-CM

## 2024-07-19 DIAGNOSIS — Z66 Do not resuscitate: Secondary | ICD-10-CM | POA: Diagnosis not present

## 2024-07-19 DIAGNOSIS — F0280 Dementia in other diseases classified elsewhere without behavioral disturbance: Secondary | ICD-10-CM | POA: Diagnosis not present

## 2024-07-19 DIAGNOSIS — Z833 Family history of diabetes mellitus: Secondary | ICD-10-CM

## 2024-07-19 DIAGNOSIS — G20B2 Parkinson's disease with dyskinesia, with fluctuations: Secondary | ICD-10-CM | POA: Diagnosis not present

## 2024-07-19 DIAGNOSIS — Z8673 Personal history of transient ischemic attack (TIA), and cerebral infarction without residual deficits: Secondary | ICD-10-CM

## 2024-07-19 DIAGNOSIS — Z515 Encounter for palliative care: Secondary | ICD-10-CM | POA: Diagnosis not present

## 2024-07-19 DIAGNOSIS — Z993 Dependence on wheelchair: Secondary | ICD-10-CM

## 2024-07-19 DIAGNOSIS — F0282 Dementia in other diseases classified elsewhere, unspecified severity, with psychotic disturbance: Secondary | ICD-10-CM | POA: Diagnosis present

## 2024-07-19 DIAGNOSIS — F3289 Other specified depressive episodes: Secondary | ICD-10-CM | POA: Diagnosis not present

## 2024-07-19 DIAGNOSIS — Z79899 Other long term (current) drug therapy: Secondary | ICD-10-CM

## 2024-07-19 DIAGNOSIS — Z7982 Long term (current) use of aspirin: Secondary | ICD-10-CM

## 2024-07-19 DIAGNOSIS — R531 Weakness: Secondary | ICD-10-CM | POA: Diagnosis not present

## 2024-07-19 DIAGNOSIS — Z803 Family history of malignant neoplasm of breast: Secondary | ICD-10-CM

## 2024-07-19 DIAGNOSIS — R410 Disorientation, unspecified: Secondary | ICD-10-CM | POA: Diagnosis not present

## 2024-07-19 DIAGNOSIS — I6523 Occlusion and stenosis of bilateral carotid arteries: Secondary | ICD-10-CM | POA: Diagnosis not present

## 2024-07-19 DIAGNOSIS — G2581 Restless legs syndrome: Secondary | ICD-10-CM | POA: Diagnosis present

## 2024-07-19 DIAGNOSIS — Z888 Allergy status to other drugs, medicaments and biological substances status: Secondary | ICD-10-CM

## 2024-07-19 DIAGNOSIS — R0989 Other specified symptoms and signs involving the circulatory and respiratory systems: Secondary | ICD-10-CM | POA: Diagnosis not present

## 2024-07-19 DIAGNOSIS — G629 Polyneuropathy, unspecified: Secondary | ICD-10-CM | POA: Diagnosis not present

## 2024-07-19 DIAGNOSIS — F02B3 Dementia in other diseases classified elsewhere, moderate, with mood disturbance: Secondary | ICD-10-CM | POA: Diagnosis not present

## 2024-07-19 LAB — CBC
HCT: 40.9 % (ref 36.0–46.0)
Hemoglobin: 13.2 g/dL (ref 12.0–15.0)
MCH: 26.9 pg (ref 26.0–34.0)
MCHC: 32.3 g/dL (ref 30.0–36.0)
MCV: 83.3 fL (ref 80.0–100.0)
Platelets: 323 K/uL (ref 150–400)
RBC: 4.91 MIL/uL (ref 3.87–5.11)
RDW: 15.8 % — ABNORMAL HIGH (ref 11.5–15.5)
WBC: 7.8 K/uL (ref 4.0–10.5)
nRBC: 0 % (ref 0.0–0.2)

## 2024-07-19 LAB — COMPREHENSIVE METABOLIC PANEL WITH GFR
ALT: 7 U/L (ref 0–44)
AST: 17 U/L (ref 15–41)
Albumin: 3.9 g/dL (ref 3.5–5.0)
Alkaline Phosphatase: 75 U/L (ref 38–126)
Anion gap: 16 — ABNORMAL HIGH (ref 5–15)
BUN: 13 mg/dL (ref 8–23)
CO2: 21 mmol/L — ABNORMAL LOW (ref 22–32)
Calcium: 9.7 mg/dL (ref 8.9–10.3)
Chloride: 102 mmol/L (ref 98–111)
Creatinine, Ser: 0.62 mg/dL (ref 0.44–1.00)
GFR, Estimated: >60 mL/min (ref >60)
Glucose, Bld: 118 mg/dL — ABNORMAL HIGH (ref 70–99)
Potassium: 3.3 mmol/L — ABNORMAL LOW (ref 3.5–5.1)
Sodium: 139 mmol/L (ref 135–145)
Total Bilirubin: 0.8 mg/dL (ref 0.0–1.2)
Total Protein: 7.7 g/dL (ref 6.5–8.1)

## 2024-07-19 LAB — URINALYSIS, ROUTINE W REFLEX MICROSCOPIC
Bilirubin Urine: NEGATIVE
Glucose, UA: NEGATIVE mg/dL
Ketones, ur: 20 mg/dL — AB
Nitrite: NEGATIVE
Protein, ur: 30 mg/dL — AB
Specific Gravity, Urine: 1.023 (ref 1.005–1.030)
pH: 5 (ref 5.0–8.0)

## 2024-07-19 LAB — RESP PANEL BY RT-PCR (RSV, FLU A&B, COVID)  RVPGX2
Influenza A by PCR: NEGATIVE
Influenza B by PCR: NEGATIVE
Resp Syncytial Virus by PCR: NEGATIVE
SARS Coronavirus 2 by RT PCR: NEGATIVE

## 2024-07-19 LAB — MAGNESIUM: Magnesium: 2.1 mg/dL (ref 1.7–2.4)

## 2024-07-19 MED ORDER — LACTATED RINGERS IV BOLUS
1000.0000 mL | Freq: Once | INTRAVENOUS | Status: AC
  Administered 2024-07-19: 1000 mL via INTRAVENOUS

## 2024-07-19 MED ORDER — SODIUM CHLORIDE 0.9 % IV SOLN
2.0000 g | Freq: Once | INTRAVENOUS | Status: DC
  Filled 2024-07-19: qty 20

## 2024-07-19 MED ORDER — POTASSIUM CHLORIDE 10 MEQ/100ML IV SOLN
10.0000 meq | Freq: Once | INTRAVENOUS | Status: AC
  Administered 2024-07-19: 10 meq via INTRAVENOUS
  Filled 2024-07-19: qty 100

## 2024-07-19 NOTE — ED Triage Notes (Signed)
 Pt comes via EMS from home with dec intake in last three days. Pt caretakers states she has times that she does this and goes days without eating. However today they are concerned she might be dehydrated.   130/90  20g in left wrist. 93% RA 85 HR 98.9 temp  Pt has dementia and Parkinsons

## 2024-07-19 NOTE — ED Notes (Signed)
 In and out cath was attempted at this time and was unsuccessful. Family stated that a banana is usually used to get a urine sample. [place], RN and MD Claudene made aware. Pure wick placed at this time. Pt bed linen was changed at this time, clean brief placed on pt, family at bedside. Pt has no other needs at this time.

## 2024-07-19 NOTE — ED Provider Notes (Signed)
 Care of this patient assumed from prior physician at 2300 pending urinalysis, reevaluation, and disposition. Please see prior physician note for further details.  Briefly, this is an 78 year old female presenting to the emergency department with decreased responsiveness, recent admission in June for UTI bacteremia.  Stable vitals on presentation.  Reassuring CBC, CMP without critical derangements.  Negative viral swab.  CT head and chest x-Deborah Velazquez without acute findings.  Pending urinalysis at time of signout.  UA demonstrated large leukocyte esterase with 21-50 white blood cells, many bacteria with white blood cell clumps.  UA concerning for infection.  Given clinical history, suspect that patient's UTI is likely contributing to her encephalopathy.  I think she is appropriate for admission in the setting of this.  Will reach out to hospitalist team.

## 2024-07-19 NOTE — ED Provider Notes (Incomplete)
 Care of this patient assumed from prior physician at 2300 pending urinalysis, reevaluation, and disposition. Please see prior physician note for further details.  Briefly, this is an 78 year old female presenting to the emergency department with decreased responsiveness, recent admission in June for UTI bacteremia.  Stable vitals on presentation.  Reassuring CBC, CMP without critical derangements.  Negative viral swab.  CT head and chest x-Glynis Hunsucker without acute findings.  Pending urinalysis at time of signout.  UA demonstrated large leukocyte esterase with 21-50 white blood cells, many bacteria with white blood cell clumps.  UA concerning for infection.  Given clinical history, suspect that patient's UTI is likely contributing to her encephalopathy.  Do think she is appropriate for admission in setting of this.  Family comfortable with this plan.  Urine culture sensitive to cephalosporin, ordered for Rocephin  and urine culture sent.  Will reach out to hospitalist team.

## 2024-07-19 NOTE — ED Notes (Signed)
 Pt given cup of water upon request

## 2024-07-19 NOTE — ED Notes (Signed)
 RN removed two out of three blankets on pt due to pt sweating.

## 2024-07-19 NOTE — ED Provider Notes (Signed)
 [facility] Provider Note    Event Date/Time   First MD Initiated Contact with Patient 07/19/24 1657     (approximate)   History   Weakness   HPI  Deborah Velazquez is a 78 y.o. female who presents to the ED for evaluation of Weakness   I review a medical DC summary from 6/17 where patient was admitted for bacteremia with positive blood cultures in the setting of a recently diagnosed UTI.  Klebsiella sensitive to ceftriaxone , completed 14 days of cephalosporins. At baseline she has a history of wheelchair-bound debility, seizure disorder on lamotrigine , Parkinson's disease and dementia.  Patient presents from home alongside her friend/POA for evaluation of lesser responsiveness and hypertension.  Her good friend is her POA as patient has no other family, sees the patient regularly and patient has 24/7 nursing and caregivers at home.  Her friend/POA saw the patient this past Thursday and she seemed okay then.    Physical Exam   Triage Vital Signs: ED Triage Vitals  Encounter Vitals Group     BP 07/19/24 1308 (!) 160/103     Girls Systolic BP Percentile --      Girls Diastolic BP Percentile --      Boys Systolic BP Percentile --      Boys Diastolic BP Percentile --      Pulse Rate 07/19/24 1713 99     Resp 07/19/24 1308 17     Temp 07/19/24 1308 98.9 F (37.2 C)     Temp Source 07/19/24 1308 Axillary     SpO2 07/19/24 1308 95 %     Weight --      Height --      Head Circumference --      Peak Flow --      Pain Score 07/19/24 1257 0     Pain Loc --      Pain Education --      Exclude from Growth Chart --     Most recent vital signs: Vitals:   07/19/24 2130 07/19/24 2145  BP: (!) 153/107   Pulse: 84 87  Resp:    Temp:    SpO2: 95% 94%    General: Keeping eyes closed, answers to her name but does not follow any commands, no distress.  Dry mucous membranes. CV:  Good peripheral perfusion.  Resp:  Normal effort.  Abd:  No distention.   Soft MSK:  No deformity noted.  No signs of trauma Neuro:  No focal deficits appreciated. Other:     ED Results / Procedures / Treatments   Labs (all labs ordered are listed, but only abnormal results are displayed) Labs Reviewed  COMPREHENSIVE METABOLIC PANEL WITH GFR - Abnormal; Notable for the following components:      Result Value   Potassium 3.3 (*)    CO2 21 (*)    Glucose, Bld 118 (*)    Anion gap 16 (*)    All other components within normal limits  CBC - Abnormal; Notable for the following components:   RDW 15.8 (*)    All other components within normal limits  RESP PANEL BY RT-PCR (RSV, FLU A&B, COVID)  RVPGX2  MAGNESIUM   URINALYSIS, ROUTINE W REFLEX MICROSCOPIC  CBG MONITORING, ED    EKG Tremulous baseline clouds fine detail, seems to be a sinus rhythm with a rate of 88 bpm.  Normal axis and intervals without clear signs of acute ischemia.  RADIOLOGY CXR interpreted by me without evidence  of acute cardiopulmonary pathology. CT head interpreted by me without evidence of acute intracranial pathology  Official radiology report(s): CT HEAD WO CONTRAST ( ) Result Date: 07/19/2024 CLINICAL DATA:  poor responsiveness superimposed on dementia and parkinson's, eval ich, cva EXAM: CT HEAD WITHOUT CONTRAST TECHNIQUE: Contiguous axial images were obtained from the base of the skull through the vertex without intravenous contrast. RADIATION DOSE REDUCTION: This exam was performed according to the departmental dose-optimization program which includes automated exposure control, adjustment of the mA and/or kV according to patient size and/or use of iterative reconstruction technique. COMPARISON:  CT head 05/08/2024 FINDINGS: Brain: Cerebral ventricle sizes are concordant with the degree of cerebral volume loss-stable. Patchy and confluent areas of decreased attenuation are noted throughout the deep and periventricular white matter of the cerebral hemispheres bilaterally, compatible  with chronic microvascular ischemic disease. No evidence of large-territorial acute infarction. No parenchymal hemorrhage. No mass lesion. No extra-axial collection. No mass effect or midline shift. No hydrocephalus. Basilar cisterns are patent. Vascular: No hyperdense vessel. Atherosclerotic calcifications are present within the cavernous internal carotid arteries. Skull: No acute fracture or focal lesion. Sinuses/Orbits: Paranasal sinuses and mastoid air cells are clear. Bilateral lens replacement. Otherwise the orbits are unremarkable. Other: None. IMPRESSION: No acute intracranial abnormality. Electronically Signed   By: Morgane  Naveau M.D.   On: 07/19/2024 19:25   DG Chest Portable 1 View Result Date: 07/19/2024 CLINICAL DATA:  poorly responsive, eval infiltrate EXAM: PORTABLE CHEST 1 VIEW COMPARISON:  Chest x-ray 05/07/2024 FINDINGS: Patient is rotated. The heart and mediastinal contours are unchanged. Limited evaluation of the right apex due to overlying mandible. Low lung volumes. No focal consolidation. No pulmonary edema. No pleural effusion. No pneumothorax. No acute osseous abnormality. IMPRESSION: 1. Low lung volumes with no active disease. 2. Limited evaluation of the right apex due to overlying mandible. 3. Electronically Signed   By: Morgane  Naveau M.D.   On: 07/19/2024 19:24    PROCEDURES and INTERVENTIONS:  Procedures  Medications  lactated ringers  bolus 1,000 mL (has no administration in time range)  lactated ringers  bolus 1,000 mL (0 mLs Intravenous Stopped 07/19/24 1825)  potassium chloride  10 mEq in 100 mL IVPB (0 mEq Intravenous Stopped 07/19/24 1825)     IMPRESSION / MDM / ASSESSMENT AND PLAN / ED COURSE  I reviewed the triage vital signs and the nursing notes.  Differential diagnosis includes, but is not limited to, CVA, ICH, metabolic encephalopathy, UTI  {Patient presents with symptoms of an acute illness or injury that is potentially life-threatening.  Patient  presents with nonfocal encephalopathy, generalized weakness.  Signed out to oncoming physician to follow-up on a UA with the possibility of acute cystitis contributing to her symptoms.  No signs of sepsis.  Normal CBC and metabolic panel.  Negative COVID swab.      FINAL CLINICAL IMPRESSION(S) / ED DIAGNOSES   Final diagnoses:  None     Rx / DC Orders   ED Discharge Orders     None        Note:  This document was prepared using Dragon voice recognition software and may include unintentional dictation errors.   Claudene Rover, MD 07/26/24 [phone]

## 2024-07-20 DIAGNOSIS — E876 Hypokalemia: Secondary | ICD-10-CM | POA: Diagnosis not present

## 2024-07-20 DIAGNOSIS — F32A Depression, unspecified: Secondary | ICD-10-CM

## 2024-07-20 DIAGNOSIS — Z96642 Presence of left artificial hip joint: Secondary | ICD-10-CM | POA: Diagnosis not present

## 2024-07-20 DIAGNOSIS — I10 Essential (primary) hypertension: Secondary | ICD-10-CM | POA: Diagnosis not present

## 2024-07-20 DIAGNOSIS — Z515 Encounter for palliative care: Secondary | ICD-10-CM

## 2024-07-20 DIAGNOSIS — G9340 Encephalopathy, unspecified: Secondary | ICD-10-CM

## 2024-07-20 DIAGNOSIS — Z7401 Bed confinement status: Secondary | ICD-10-CM | POA: Diagnosis not present

## 2024-07-20 DIAGNOSIS — G2581 Restless legs syndrome: Secondary | ICD-10-CM | POA: Diagnosis not present

## 2024-07-20 DIAGNOSIS — N3000 Acute cystitis without hematuria: Secondary | ICD-10-CM | POA: Diagnosis not present

## 2024-07-20 DIAGNOSIS — Z1152 Encounter for screening for COVID-19: Secondary | ICD-10-CM | POA: Diagnosis not present

## 2024-07-20 DIAGNOSIS — G20A1 Parkinson's disease without dyskinesia, without mention of fluctuations: Secondary | ICD-10-CM

## 2024-07-20 DIAGNOSIS — G9341 Metabolic encephalopathy: Secondary | ICD-10-CM | POA: Diagnosis not present

## 2024-07-20 DIAGNOSIS — G20B2 Parkinson's disease with dyskinesia, with fluctuations: Secondary | ICD-10-CM | POA: Diagnosis not present

## 2024-07-20 DIAGNOSIS — N390 Urinary tract infection, site not specified: Secondary | ICD-10-CM | POA: Diagnosis not present

## 2024-07-20 DIAGNOSIS — Z7982 Long term (current) use of aspirin: Secondary | ICD-10-CM | POA: Diagnosis not present

## 2024-07-20 DIAGNOSIS — Z789 Other specified health status: Secondary | ICD-10-CM | POA: Diagnosis not present

## 2024-07-20 DIAGNOSIS — R1310 Dysphagia, unspecified: Secondary | ICD-10-CM | POA: Diagnosis not present

## 2024-07-20 DIAGNOSIS — Z993 Dependence on wheelchair: Secondary | ICD-10-CM | POA: Diagnosis not present

## 2024-07-20 DIAGNOSIS — F0283 Dementia in other diseases classified elsewhere, unspecified severity, with mood disturbance: Secondary | ICD-10-CM | POA: Diagnosis not present

## 2024-07-20 DIAGNOSIS — Z833 Family history of diabetes mellitus: Secondary | ICD-10-CM | POA: Diagnosis not present

## 2024-07-20 DIAGNOSIS — Z803 Family history of malignant neoplasm of breast: Secondary | ICD-10-CM | POA: Diagnosis not present

## 2024-07-20 DIAGNOSIS — G629 Polyneuropathy, unspecified: Secondary | ICD-10-CM

## 2024-07-20 DIAGNOSIS — Z66 Do not resuscitate: Secondary | ICD-10-CM | POA: Diagnosis not present

## 2024-07-20 DIAGNOSIS — G3183 Dementia with Lewy bodies: Secondary | ICD-10-CM | POA: Diagnosis not present

## 2024-07-20 DIAGNOSIS — G40909 Epilepsy, unspecified, not intractable, without status epilepticus: Secondary | ICD-10-CM | POA: Diagnosis not present

## 2024-07-20 DIAGNOSIS — E7800 Pure hypercholesterolemia, unspecified: Secondary | ICD-10-CM | POA: Diagnosis not present

## 2024-07-20 DIAGNOSIS — F0282 Dementia in other diseases classified elsewhere, unspecified severity, with psychotic disturbance: Secondary | ICD-10-CM | POA: Diagnosis not present

## 2024-07-20 DIAGNOSIS — Z8249 Family history of ischemic heart disease and other diseases of the circulatory system: Secondary | ICD-10-CM | POA: Diagnosis not present

## 2024-07-20 LAB — CBC
HCT: 37.6 % (ref 36.0–46.0)
Hemoglobin: 12.1 g/dL (ref 12.0–15.0)
MCH: 26.7 pg (ref 26.0–34.0)
MCHC: 32.2 g/dL (ref 30.0–36.0)
MCV: 83 fL (ref 80.0–100.0)
Platelets: 308 K/uL (ref 150–400)
RBC: 4.53 MIL/uL (ref 3.87–5.11)
RDW: 15.8 % — ABNORMAL HIGH (ref 11.5–15.5)
WBC: 9 K/uL (ref 4.0–10.5)
nRBC: 0 % (ref 0.0–0.2)

## 2024-07-20 LAB — BASIC METABOLIC PANEL WITH GFR
Anion gap: 9 (ref 5–15)
BUN: 10 mg/dL (ref 8–23)
CO2: 24 mmol/L (ref 22–32)
Calcium: 9.2 mg/dL (ref 8.9–10.3)
Chloride: 107 mmol/L (ref 98–111)
Creatinine, Ser: 0.58 mg/dL (ref 0.44–1.00)
GFR, Estimated: >60 mL/min (ref >60)
Glucose, Bld: 123 mg/dL — ABNORMAL HIGH (ref 70–99)
Potassium: 3.3 mmol/L — ABNORMAL LOW (ref 3.5–5.1)
Sodium: 140 mmol/L (ref 135–145)

## 2024-07-20 MED ORDER — DULOXETINE HCL 30 MG PO CPEP
60.0000 mg | ORAL_CAPSULE | Freq: Every day | ORAL | Status: DC
  Administered 2024-07-24 – 2024-07-27 (×3): 60 mg via ORAL
  Filled 2024-07-20 (×4): qty 2

## 2024-07-20 MED ORDER — PREGABALIN 75 MG PO CAPS
150.0000 mg | ORAL_CAPSULE | Freq: Two times a day (BID) | ORAL | Status: DC
  Filled 2024-07-20: qty 2

## 2024-07-20 MED ORDER — QUETIAPINE FUMARATE 25 MG PO TABS: 12.5000 mg | ORAL_TABLET | Freq: Two times a day (BID) | ORAL | Status: DC

## 2024-07-20 MED ORDER — ENSURE ENLIVE PO LIQD
237.0000 mL | Freq: Two times a day (BID) | ORAL | Status: DC
  Administered 2024-07-24 – 2024-07-27 (×5): 237 mL via ORAL
  Filled 2024-07-20: qty 237

## 2024-07-20 MED ORDER — POTASSIUM CHLORIDE IN NACL 20-0.9 MEQ/L-% IV SOLN
INTRAVENOUS | Status: DC
  Filled 2024-07-20 (×5): qty 1000

## 2024-07-20 MED ORDER — ACETAMINOPHEN 325 MG PO TABS: 650.0000 mg | ORAL_TABLET | Freq: Four times a day (QID) | ORAL | Status: DC | PRN

## 2024-07-20 MED ORDER — QUETIAPINE FUMARATE 25 MG PO TABS: 25.0000 mg | ORAL_TABLET | Freq: Every day | ORAL | Status: DC

## 2024-07-20 MED ORDER — CARBIDOPA-LEVODOPA ER 50-200 MG PO TBCR
1.0000 | EXTENDED_RELEASE_TABLET | Freq: Every day | ORAL | Status: DC
  Administered 2024-07-24 – 2024-07-25 (×7): 1 via ORAL
  Filled 2024-07-20 (×30): qty 1

## 2024-07-20 MED ORDER — ENOXAPARIN SODIUM 40 MG/0.4ML IJ SOSY
40.0000 mg | PREFILLED_SYRINGE | INTRAMUSCULAR | Status: DC
  Administered 2024-07-20 – 2024-07-27 (×6): 40 mg via SUBCUTANEOUS
  Filled 2024-07-20 (×6): qty 0.4

## 2024-07-20 MED ORDER — ONDANSETRON HCL 4 MG/2ML IJ SOLN: 4.0000 mg | Freq: Four times a day (QID) | INTRAMUSCULAR | Status: DC | PRN

## 2024-07-20 MED ORDER — ACETAMINOPHEN 650 MG RE SUPP: 650.0000 mg | Freq: Four times a day (QID) | RECTAL | Status: DC | PRN

## 2024-07-20 MED ORDER — SERTRALINE HCL 50 MG PO TABS
200.0000 mg | ORAL_TABLET | Freq: Every day | ORAL | Status: DC
  Administered 2024-07-24 – 2024-07-27 (×3): 200 mg via ORAL
  Filled 2024-07-20 (×4): qty 4

## 2024-07-20 MED ORDER — ONDANSETRON HCL 4 MG PO TABS: 4.0000 mg | ORAL_TABLET | Freq: Four times a day (QID) | ORAL | Status: DC | PRN

## 2024-07-20 MED ORDER — LAMOTRIGINE 100 MG PO TABS
100.0000 mg | ORAL_TABLET | Freq: Two times a day (BID) | ORAL | Status: DC
  Administered 2024-07-24 – 2024-07-27 (×6): 100 mg via ORAL
  Filled 2024-07-20 (×7): qty 1

## 2024-07-20 MED ORDER — MAGNESIUM HYDROXIDE 400 MG/5ML PO SUSP: 30.0000 mL | Freq: Every day | ORAL | Status: DC | PRN

## 2024-07-20 MED ORDER — TRAZODONE HCL 50 MG PO TABS
25.0000 mg | ORAL_TABLET | Freq: Every evening | ORAL | Status: DC | PRN
Start: 2024-07-20 — End: 2024-07-23

## 2024-07-20 MED ORDER — ATORVASTATIN CALCIUM 10 MG PO TABS
10.0000 mg | ORAL_TABLET | Freq: Every day | ORAL | Status: DC
  Administered 2024-07-24: 10 mg via ORAL
  Filled 2024-07-20 (×2): qty 1

## 2024-07-20 MED ORDER — QUETIAPINE FUMARATE 25 MG PO TABS
12.5000 mg | ORAL_TABLET | Freq: Every day | ORAL | Status: DC
Start: 2024-07-20 — End: 2024-07-23
  Filled 2024-07-20: qty 1

## 2024-07-20 MED ORDER — ASPIRIN 81 MG PO TBEC
81.0000 mg | DELAYED_RELEASE_TABLET | Freq: Every day | ORAL | Status: DC
  Administered 2024-07-24 – 2024-07-27 (×3): 81 mg via ORAL
  Filled 2024-07-20 (×4): qty 1

## 2024-07-20 MED ORDER — SODIUM CHLORIDE 0.9 % IV SOLN
2.0000 g | INTRAVENOUS | Status: AC
  Administered 2024-07-20 – 2024-07-24 (×5): 2 g via INTRAVENOUS
  Filled 2024-07-20 (×4): qty 20

## 2024-07-20 NOTE — ED Notes (Signed)
 This RN and [place], [place] in room to check rectal temp on pt as pt noted to be warm to touch but refuses to take temperature underneath her tongue. This RN and NT noticed that pt had large bowel movement in brief. This RN and NT removed previous dirty brief and cleaned pt with bath wipes. Pt tolerated well.

## 2024-07-20 NOTE — Assessment & Plan Note (Addendum)
 Sinemet  if able to take.  Changed to short acting because the short acting is able to be crushed in the long-acting is not.  She was pocketing her pills yesterday.

## 2024-07-20 NOTE — Care Management Obs Status (Signed)
 MEDICARE OBSERVATION STATUS NOTIFICATION   Patient Details  Name: Deborah Velazquez MRN: [id number] Date of Birth: 08/31/1946   Medicare Observation Status Notification Given:  Yes (did want a copy POA signed)    Rojelio [name] 07/20/2024, 12:09 PM

## 2024-07-20 NOTE — Plan of Care (Signed)

## 2024-07-20 NOTE — Progress Notes (Signed)
 Notified Dr. Leesa at 1624 that patient's blood pressure is 140/120. MD saw secure chat but did not respond. Blood pressure rechecked and is 162/96 currently. RN sent secure chat and Dr. Leesa responded at her age and lack of PO intake. Blood pressure is fine as is for now.

## 2024-07-20 NOTE — Progress Notes (Signed)
 Notified Dr. Leesa via secure chat that patient's Blood pressure this morning is 165/95 and that patient has refused all oral medications so far today. MD acknowledged, now new orders given at this time.

## 2024-07-20 NOTE — Assessment & Plan Note (Addendum)
 Holding Lyrica 

## 2024-07-20 NOTE — Assessment & Plan Note (Addendum)
 Unfortunately initial urine culture growing multiple species.  Repeat urine analysis negative.  Rocephin  probably working since repeat urine analysis was negative.  Completed Rocephin .

## 2024-07-20 NOTE — Plan of Care (Signed)
°  Problem: Clinical Measurements: Goal: Cardiovascular complication will be avoided Outcome: Progressing   Problem: Elimination: Goal: Will not experience complications related to bowel motility Outcome: Progressing Goal: Will not experience complications related to urinary retention Outcome: Progressing   Problem: Safety: Goal: Ability to remain free from injury will improve Outcome: Progressing   Problem: Skin Integrity: Goal: Risk for impaired skin integrity will decrease Outcome: Progressing

## 2024-07-20 NOTE — Assessment & Plan Note (Addendum)
 Yesterday and today patient's mental status better than previous.  Patient having hallucinations seeing deer outside.  Having trouble swallowing the extended release Sinemet  but took her other medications crushed in applesauce.  Patient on dysphagia 2 diet.  Will see how she eats today.  Overall prognosis will be based on how much she can eat.

## 2024-07-20 NOTE — Progress Notes (Signed)
   Interim no charge progress note   This patient, Deborah Velazquez is a 78 y.o. female who was admitted by colleague earlier this morning for acute metabolic encephalopathy secondary to UTI.  This patient is well-known to me from prior admission.  She has advanced Parkinson's disease with hallucinations, is mostly bedbound, and goes through periods of time where she sleeps for 48 to 72 hours.  During this time, she does not typically take medication or nutrition.  She was recently admitted in June with bacteremia secondary to UTI. On this admission patient's caregivers report that she has been somnolent for 4 days with no p.o. intake.  Labs on admission were grossly unremarkable.  UA with bacteria, leukocytes, and patient was empirically started on ceftriaxone .  At the time of my evaluation patient is very somnolent, she is mumbling incoherently.  Her POA is at bedside.  I have again approached hospice or palliative care.  At this time her POA reports that she has quality of life at home with around-the-clock caregivers and they are not ready to pursue hospice.  Unfortunately given the advancement of her Parkinson's dementia I fear she is nearing end-stage.  Her POA, [name], reports that she would like to further consider options with the patient's husband.  Palliative care has been consulted.  We will continue with ceftriaxone  and IV fluids until patient is more alert and we are able to proceed with p.o. intake. Follow urine culture.     07/20/2024    8:53 AM 07/20/2024    5:10 AM 07/20/2024    1:45 AM  Vitals with BMI  Systolic 165 164 824  Diastolic 95 92 87  Pulse 77 77 70       Latest Ref Rng & Units 07/20/2024    4:18 AM 07/19/2024    1:00 PM 05/11/2024    5:03 AM  CBC  WBC 4.0 - 10.5 K/uL 9.0  7.8  8.3   Hemoglobin 12.0 - 15.0 g/dL 87.8  86.7  89.8   Hematocrit 36.0 - 46.0 % 37.6  40.9  32.7   Platelets 150 - 400 K/uL 308  323  331        Latest Ref Rng & Units 07/20/2024     4:18 AM 07/19/2024    1:00 PM 05/11/2024    5:03 AM  BMP  Glucose 70 - 99 mg/dL 876  881  879   BUN 8 - 23 mg/dL 10  13  12    Creatinine 0.44 - 1.00 mg/dL 9.41  9.37  9.42   Sodium 135 - 145 mmol/L 140  139  144   Potassium 3.5 - 5.1 mmol/L 3.3  3.3  3.3   Chloride 98 - 111 mmol/L 107  102  107   CO2 22 - 32 mmol/L 24  21  29    Calcium  8.9 - 10.3 mg/dL 9.2  9.7  8.9

## 2024-07-20 NOTE — Assessment & Plan Note (Addendum)
 Zoloft  and Cymbalta 

## 2024-07-20 NOTE — Assessment & Plan Note (Addendum)
 Replaced

## 2024-07-20 NOTE — Progress Notes (Signed)
 RN notified Dr. Dezii that patient's family is asking for a purwick for the patient. MD gave order to place external catheter.

## 2024-07-20 NOTE — Consult Note (Cosign Needed Addendum)
 Consultation Note Date: 07/20/2024 at 1100  Patient Name: Deborah Velazquez  DOB: 1946-04-19  MRN: [id number]  Age / Sex: 78 y.o., female  PCP: Deborah Verneita CROME, MD Referring Physician: Leesa Kast, DO  HPI/Patient Profile: 78 y.o. female  with past medical history of anxiety, arthritis, hypercholesterolemia, hyperlipidemia, IBS, Parkinson disease, restless leg syndrome, TIA, and June 2024 hospitalization for UTI due to Klebsiella species admitted on 07/19/2024 with acute onset of generalized weakness and confusion outside of her baseline with decreased responsiveness.  Upon arrival to the ED, patient was hypertensive with hypokalemia.  IV fluids were given in addition to IV potassium chloride  and Rocephin .  UA was positive for UTI so urine culture was sent.  CT revealed no acute intracranial abnormalities.  Radiograph of chest revealed low lung volumes with no acute cardiopulmonary disease.  Patient is being treated for acute metabolic encephalopathy likely due to to lower UTI infection.  PMT was consulted to support patient and family care discussions.  Clinical Assessment and Goals of Care: Extensive chart review completed prior to meeting patient including labs, vital signs, imaging, progress notes, orders, and available advanced directive documents from current and previous encounters. I then met with patient and her friend Rock to discuss diagnosis prognosis, GOC, EOL wishes, disposition and options.  Patient is awake and alert, but is unable to participate in medical decision-making or goals of care discussions independently at this time.  She makes some effort to speak but her words are mumbled.  She is intermittently awake and asleep during my discussion.  Entirety of discussion was held with patient's longtime friend Rock at bedside.  I introduced Palliative Medicine as specialized medical care  for people living with serious illness. It focuses on providing relief from the symptoms and stress of a serious illness. The goal is to improve quality of life for both the patient and the family.  We discussed a brief life review of the patient.  Patient and mother have been present since high school (60+ years).  Patient has been a bit shoulder.  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  Rock shares that patient did extremely well for being a woman in a man's world.  Rock shares that she helps coordinate 24/7 caregiver support for patient and her husband.  Apolinar shares she noted that patient started to not be as interactive and did not help to get out of bed last Friday.  She stated she monitored her over the weekend, noted that her blood pressure started to rise as well as heart rate, and noted that patient was likely having another UTI.  As far as functional and nutritional status prior to this most recent infection, Rock endorses that patient is able to walk with a tall walker and has a wheelchair that she is able to go outside in.  Under shares patient had a good quality of life with nursing care at home but that she has refused to eat, drink, or interact is normal since this urinary tract infection  started last Friday.  We discussed patient's current illness and what it means in the larger context of patient's on-going co-morbidities.  Discussed concern for patient continued to refuse p.o. intake.  Reviewed that IVF is being given to avoid dehydration.  However, this is a short-term answer.  Patient p.o. intake is a significant factor to patient's overall prognosis.  Rock shares understanding.  She is hopeful that the antibiotics will continue to treat the urinary tract infection and the patient will come around.  Discussed remaining hopeful for patient's improvement and also realistic.  Discussed the what if.  Rock shares she knows patient would never be accepting of a feeding  tube if she does not take n.p.o. than naturally.  Discussed patient would be facing end-of-life if IV fluids were removed and she was unable to take NPO.  Rock shares she hopes it does not come to that.  Additionally, discussed importance of patient taking her p.o. meds such as levodopa /carbidopa  in order to avoid rigidity due to Parkinson's disease.  Discussed importance of functional status in addition to nutritional status as significant contributors to patient's overall prognosis.  I attempted to discuss boundaries of medical treatment such as CODE STATUS.  Rock shares that she misspoke and that she never intended for the patient to be a DNR.  She shares that the patient has a strong heart and she finds no reason to think she should not be accepting of cardiopulmonary resuscitation at this time.   Encouraged Rock to consider DNR/DNI status understanding evidenced based poor outcomes in similar hospitalized patients, as the cause of the arrest is likely associated with chronic/terminal disease rather than a reversible acute cardio-pulmonary event.    Education offered regarding concept specific to human mortality and the limitations of medical interventions to prolong life when the body begins to fail to thrive.  She was she understands but she is hoping that the patient can turn the corner.  Reviewed that patient has an advanced directive that names her husband is her next of kin decision-maker.  Rock shares that patient's husband Rock to help make decisions for both the patient and himself.  She shares that he does not like to hear anything negative for make any decisions that may be hard in regards to patient's medical treatment.  She shares she is in contact with patient's husband and makes decisions with him moving forward.  No change to plan of care at this time.  Watchful waiting.  PMT will continue to follow and support.  Primary Decision Maker HCPOA  Physical Exam Vitals  reviewed.  Constitutional:      General: She is not in acute distress.    Appearance: She is normal weight. She is not ill-appearing.  HENT:     Mouth/Throat:     Mouth: Mucous membranes are moist.  Eyes:     Pupils: Pupils are equal, round, and reactive to light.  Pulmonary:     Effort: Pulmonary effort is normal.  Abdominal:     Palpations: Abdomen is soft.  Skin:    General: Skin is warm and dry.  Neurological:     Mental Status: She is alert.     Comments: Mumbles, unable to engage in conversation  Psychiatric:        Behavior: Behavior normal.     Palliative Assessment/Data: 30%     Thank you for this consult. Palliative medicine will continue to follow and assist holistically.   Time Total: 75 minutes  Time spent includes:  Detailed review of medical records (labs, imaging, vital signs), medically appropriate exam (mental status, respiratory, cardiac, skin), discussed with treatment team, counseling and educating patient, family and staff, documenting clinical information, medication management and coordination of care.  Signed by: Lamarr Gunner, DNP, FNP-BC Palliative Medicine   Please contact Palliative Medicine Team providers via [facility] for questions and concerns.

## 2024-07-20 NOTE — H&P (Signed)
 Deborah Velazquez   PATIENT NAME: Deborah Velazquez    MR#:  [id number]  DATE OF BIRTH:  10-24-1946  DATE OF ADMISSION:  07/19/2024  PRIMARY CARE PHYSICIAN: Marylynn Verneita CROME, MD   Patient is coming from: Home  REQUESTING/REFERRING PHYSICIAN: Levander Slate, MD  CHIEF COMPLAINT:   Chief Complaint  Patient presents with   Weakness    HISTORY OF PRESENT ILLNESS:  Deborah Velazquez is a 78 y.o. female with medical history significant for anxiety, dyslipidemia, osteoarthritis, RLS, CVA, seizure disorder and Parkinson's disease, who presented to the emergency room with acute onset of generalized weakness and confusion more than her baseline with decreased responsiveness.  She was admitted in June for Klebsiella UTI and bacteremia that was sensitive to ceftriaxone .  No reported fever or chills.  No reported nausea or vomiting or abdominal pain.  No reported chest pain or palpitations or cough or wheezing or dyspnea.  The patient was very somnolent and therefore not historian.  ED Course: When she came to the ER, BP was 146/103 with otherwise normal vital signs.  Labs revealed hypokalemia of 3.3 and CO2 21 with a -60 in the liter close and CBC was unremarkable.  Respiratory panel came back negative.  UA was positive for UTI.  Urine culture was sent. EKG as reviewed by me : EKG showed sinus rhythm with a rate of 88 with first-degree AV block Imaging: Noncontrasted CT scan showed no acute intracranial normalities chest x-ray showed low lung volumes with no acute cardiopulmonary disease.  The patient was given 2 L bolus of IV lactated ringer  and chemical IV potassium chloride  in addition to IV Rocephin .  She will be admitted to a medical telemetry observation bed for further evaluation and management. PAST MEDICAL HISTORY:   Past Medical History:  Diagnosis Date   Anxiety    Arthritis    Hypercholesterolemia    Hyperlipemia    Irritable bowel syndrome    Parkinson disease (HCC)    Pneumonia due to  human metapneumovirus 03/18/2023   Restless leg syndrome    RSD (reflex sympathetic dystrophy)    pt denies   Stroke [facility])    TIA's   UTI due to Klebsiella species 05/10/2023    PAST SURGICAL HISTORY:   Past Surgical History:  Procedure Laterality Date   APPENDECTOMY     BACK SURGERY     BREAST BIOPSY Left 08/16/2013   neg   CATARACT EXTRACTION Left    CATARACT EXTRACTION W/PHACO Right 06/01/2020   Procedure: CATARACT EXTRACTION PHACO AND INTRAOCULAR LENS PLACEMENT (IOC) RIGHT;  Surgeon: Mittie Gaskin, MD;  Location: [facility] ORS;  Service: Ophthalmology;  Laterality: Right;  cde5.26 us01:05.7 ap8.0   COLONOSCOPY     COLONOSCOPY WITH PROPOFOL  N/A 06/20/2015   Procedure: COLONOSCOPY WITH PROPOFOL ;  Surgeon: Gladis RAYMOND Mariner, MD;  Location: [facility] ENDOSCOPY;  Service: Endoscopy;  Laterality: N/A;   EYE SURGERY     Cataract in one eye   FRACTURE SURGERY     wrist - right and right leg. (? if any metal)   HIP ARTHROPLASTY Left 05/08/2023   Procedure: ARTHROPLASTY BIPOLAR HIP (HEMIARTHROPLASTY);  Surgeon: Lorelle Hussar, MD;  Location: [facility] ORS;  Service: Orthopedics;  Laterality: Left;   PARS PLANA VITRECTOMY Left 2010    SOCIAL HISTORY:   Social History   Tobacco Use   Smoking status: Never   Smokeless tobacco: Never  Substance Use Topics   Alcohol use: Yes    Comment: rare  FAMILY HISTORY:   Family History  Problem Relation Age of Onset   Breast cancer Mother 63   Diabetes Mother    Cancer Mother    Heart disease Mother    Alzheimer's disease Father    Heart disease Maternal Aunt     DRUG ALLERGIES:   Allergies  Allergen Reactions   Pseudoephedrine Hcl Anxiety and Other (See Comments)    Nervous   Sudafed [Pseudoephedrine] Anxiety and Other (See Comments)    Nervous    REVIEW OF SYSTEMS:   ROS As per history of present illness. All pertinent systems were reviewed above. Constitutional, HEENT, cardiovascular, respiratory, GI, GU, musculoskeletal,  neuro, psychiatric, endocrine, integumentary and hematologic systems were reviewed and are otherwise negative/unremarkable except for positive findings mentioned above in the HPI.   MEDICATIONS AT HOME:   Prior to Admission medications   Medication Sig Start Date End Date Taking? Authorizing Provider  aspirin  EC 81 MG tablet Take 81 mg by mouth daily. Swallow whole.   Yes [provider]  atorvastatin  (LIPITOR) 10 MG tablet TAKE 1 TABLET(10 MG) BY MOUTH AT BEDTIME 04/20/24  Yes Tullo, Teresa L, MD  carbidopa -levodopa  (SINEMET  CR) 50-200 MG tablet Take 1 tablet by mouth 5 (five) times daily. Patient takes at 0700,1030,1400,1730,2200 12/30/19  Yes [provider]  lamoTRIgine  (LAMICTAL ) 100 MG tablet Take 1 tablet by mouth 2 (two) times daily. 10/29/23 10/28/24 Yes [provider]  pregabalin  (LYRICA ) 150 MG capsule Take 150 mg by mouth 2 (two) times daily.  06/10/19  Yes [provider]  QUEtiapine  (SEROQUEL ) 25 MG tablet Take 0.5 tablets (12.5 mg total) by mouth at bedtime. Patient taking differently: Take 12.5 mg by mouth 2 (two) times daily. 12.5mg  in Am and 25mg  PM 03/20/23  Yes Wieting, Richard, MD  sertraline  (ZOLOFT ) 100 MG tablet Take 200 mg by mouth daily.    Yes [provider]  DULoxetine  (CYMBALTA ) 60 MG capsule Take 60 mg by mouth daily.     [provider]  feeding supplement (ENSURE ENLIVE / ENSURE PLUS) LIQD Take 237 mLs by mouth 2 (two) times daily between meals. 01/15/24   Fausto Burnard LABOR, DO  Multiple Vitamin (MULTI-VITAMIN) tablet Take 1 tablet by mouth daily.    [provider]  OXYGEN  Inhale 2 L/min into the lungs at bedtime.    [provider]      VITAL SIGNS:  Blood pressure (!) 164/92, pulse 77, temperature 97.6 F (36.4 C), temperature source Oral, resp. rate 16, weight 66 kg, SpO2 95%.  PHYSICAL EXAMINATION:  Physical Exam  GENERAL:  78 y.o.-year-old Caucasian female patient lying in the bed with  no acute distress.  EYES: Pupils equal, round, reactive to light and accommodation. No scleral icterus. Extraocular muscles intact.  HEENT: Head atraumatic, normocephalic. Oropharynx and nasopharynx clear.  NECK:  Supple, no jugular venous distention. No thyroid  enlargement, no tenderness.  LUNGS: Normal breath sounds bilaterally, no wheezing, rales,rhonchi or crepitation. No use of accessory muscles of respiration.  CARDIOVASCULAR: Regular rate and rhythm, S1, S2 normal. No murmurs, rubs, or gallops.  ABDOMEN: Soft, nondistended, nontender. Bowel sounds present. No organomegaly or mass.  EXTREMITIES: No pedal edema, cyanosis, or clubbing.  NEUROLOGIC: She was very somnolent and difficult to arouse.  No lateralizing signs. PSYCHIATRIC: The patient is very somnolent and difficult to arouse with no good eye contact. SKIN: No obvious rash, lesion, or ulcer.   LABORATORY PANEL:   CBC Recent Labs  Lab 07/20/24 0418  WBC 9.0  HGB 12.1  HCT 37.6  PLT 308   ------------------------------------------------------------------------------------------------------------------  Chemistries  Recent Labs  Lab 07/19/24 1300 07/20/24 0418  NA 139 140  K 3.3* 3.3*  CL 102 107  CO2 21* 24  GLUCOSE 118* 123*  BUN 13 10  CREATININE 0.62 0.58  CALCIUM  9.7 9.2  MG 2.1  --   AST 17  --   ALT 7  --   ALKPHOS 75  --   BILITOT 0.8  --    ------------------------------------------------------------------------------------------------------------------  Cardiac Enzymes No results for input(s): TROPONINI in the last 168 hours. ------------------------------------------------------------------------------------------------------------------  RADIOLOGY:  CT HEAD WO CONTRAST ( ) Result Date: 07/19/2024 CLINICAL DATA:  poor responsiveness superimposed on dementia and parkinson's, eval ich, cva EXAM: CT HEAD WITHOUT CONTRAST TECHNIQUE: Contiguous axial images were obtained from the base of the  skull through the vertex without intravenous contrast. RADIATION DOSE REDUCTION: This exam was performed according to the departmental dose-optimization program which includes automated exposure control, adjustment of the mA and/or kV according to patient size and/or use of iterative reconstruction technique. COMPARISON:  CT head 05/08/2024 FINDINGS: Brain: Cerebral ventricle sizes are concordant with the degree of cerebral volume loss-stable. Patchy and confluent areas of decreased attenuation are noted throughout the deep and periventricular white matter of the cerebral hemispheres bilaterally, compatible with chronic microvascular ischemic disease. No evidence of large-territorial acute infarction. No parenchymal hemorrhage. No mass lesion. No extra-axial collection. No mass effect or midline shift. No hydrocephalus. Basilar cisterns are patent. Vascular: No hyperdense vessel. Atherosclerotic calcifications are present within the cavernous internal carotid arteries. Skull: No acute fracture or focal lesion. Sinuses/Orbits: Paranasal sinuses and mastoid air cells are clear. Bilateral lens replacement. Otherwise the orbits are unremarkable. Other: None. IMPRESSION: No acute intracranial abnormality. Electronically Signed   By: Morgane  Naveau M.D.   On: 07/19/2024 19:25   DG Chest Portable 1 View Result Date: 07/19/2024 CLINICAL DATA:  poorly responsive, eval infiltrate EXAM: PORTABLE CHEST 1 VIEW COMPARISON:  Chest x-ray 05/07/2024 FINDINGS: Patient is rotated. The heart and mediastinal contours are unchanged. Limited evaluation of the right apex due to overlying mandible. Low lung volumes. No focal consolidation. No pulmonary edema. No pleural effusion. No pneumothorax. No acute osseous abnormality. IMPRESSION: 1. Low lung volumes with no active disease. 2. Limited evaluation of the right apex due to overlying mandible. 3. Electronically Signed   By: Morgane  Naveau M.D.   On: 07/19/2024 19:24       IMPRESSION AND PLAN:  Assessment and Plan: * Acute metabolic encephalopathy - This is clearly secondary to acute lower UTI. - She will be admitted to a medical telemetry observation bed. - Will follow neurochecks every 4 hour for 24 hours. - Management otherwise as below.  Acute lower UTI - She will be continued on IV Rocephin . - Will follow urine culture and sensitivity.  Hypokalemia - Potassium will be replaced.  Parkinson's disease (HCC) - Will continue her Sinemet  CR.  Depression - Will continue her Seroquel  and Zoloft  and Cymbalta  as well as Lamictal .  Peripheral neuropathy - Will continue her Lyrica  and Cymbalta .     DVT prophylaxis: Lovenox .  Advanced Care Planning:  Code Status: full code.  Family Communication:  The plan of care was discussed in details with the patient (and family). I answered all questions. The patient agreed to proceed with the above mentioned plan. Further management will depend upon hospital course. Disposition Plan: Back to previous home environment Consults called: none.  All the records are reviewed and  case discussed with ED provider.  Status is: Observation  I certify that at the time of admission, it is my clinical judgment that the patient will require  hospital care extending less than 2 midnights.                            Dispo: The patient is from: Home              Anticipated d/c is to: Home              Patient currently is not medically stable to d/c.              Difficult to place patient: No  Madison DELENA Peaches M.D on 07/20/2024 at 6:09 AM  [facility]   From 7 PM-7 AM, contact night-coverage [email or web address]  CC: Primary care physician; Marylynn Verneita CROME, MD

## 2024-07-21 DIAGNOSIS — N3000 Acute cystitis without hematuria: Secondary | ICD-10-CM | POA: Diagnosis not present

## 2024-07-21 DIAGNOSIS — F02B3 Dementia in other diseases classified elsewhere, moderate, with mood disturbance: Secondary | ICD-10-CM

## 2024-07-21 DIAGNOSIS — F3289 Other specified depressive episodes: Secondary | ICD-10-CM

## 2024-07-21 DIAGNOSIS — N390 Urinary tract infection, site not specified: Secondary | ICD-10-CM | POA: Diagnosis not present

## 2024-07-21 DIAGNOSIS — G20A1 Parkinson's disease without dyskinesia, without mention of fluctuations: Secondary | ICD-10-CM | POA: Diagnosis not present

## 2024-07-21 DIAGNOSIS — G9341 Metabolic encephalopathy: Secondary | ICD-10-CM | POA: Diagnosis not present

## 2024-07-21 DIAGNOSIS — R197 Diarrhea, unspecified: Secondary | ICD-10-CM | POA: Insufficient documentation

## 2024-07-21 DIAGNOSIS — G9340 Encephalopathy, unspecified: Secondary | ICD-10-CM | POA: Diagnosis not present

## 2024-07-21 DIAGNOSIS — E876 Hypokalemia: Secondary | ICD-10-CM | POA: Diagnosis not present

## 2024-07-21 DIAGNOSIS — G3183 Dementia with Lewy bodies: Secondary | ICD-10-CM

## 2024-07-21 DIAGNOSIS — G20B2 Parkinson's disease with dyskinesia, with fluctuations: Secondary | ICD-10-CM | POA: Diagnosis not present

## 2024-07-21 LAB — GASTROINTESTINAL PANEL BY PCR, STOOL (REPLACES STOOL CULTURE)

## 2024-07-21 LAB — C DIFFICILE QUICK SCREEN W PCR REFLEX
C Diff antigen: NEGATIVE
C Diff interpretation: NOT DETECTED
C Diff toxin: NEGATIVE

## 2024-07-21 LAB — URINE CULTURE

## 2024-07-21 MED ORDER — POTASSIUM CHLORIDE IN NACL 20-0.9 MEQ/L-% IV SOLN
INTRAVENOUS | Status: DC
  Filled 2024-07-21 (×3): qty 1000

## 2024-07-21 NOTE — Plan of Care (Signed)
  Problem: Clinical Measurements: Goal: Cardiovascular complication will be avoided Outcome: Progressing   Problem: Elimination: Goal: Will not experience complications related to bowel motility Outcome: Progressing Goal: Will not experience complications related to urinary retention Outcome: Progressing   Problem: Pain Managment: Goal: General experience of comfort will improve and/or be controlled Outcome: Progressing   Problem: Safety: Goal: Ability to remain free from injury will improve Outcome: Progressing

## 2024-07-21 NOTE — Hospital Course (Addendum)
 78 y.o. female with medical history significant for anxiety, dyslipidemia, osteoarthritis, RLS, CVA, seizure disorder and Parkinson's disease, who presented to the emergency room with acute onset of generalized weakness and confusion more than her baseline with decreased responsiveness.  She was admitted in June for Klebsiella UTI and bacteremia that was sensitive to ceftriaxone .  No reported fever or chills.  No reported nausea or vomiting or abdominal pain.  No reported chest pain or palpitations or cough or wheezing or dyspnea.  The patient was very somnolent and therefore not historian.   ED Course: When she came to the ER, BP was 146/103 with otherwise normal vital signs.  Labs revealed hypokalemia of 3.3 and CO2 21 with a -60 in the liter close and CBC was unremarkable.  Respiratory panel came back negative.  UA was positive for UTI.  Urine culture was sent. EKG as reviewed by me : EKG showed sinus rhythm with a rate of 88 with first-degree AV block Imaging: Noncontrasted CT scan showed no acute intracranial normalities chest x-ray showed low lung volumes with no acute cardiopulmonary disease.   The patient was given 2 L bolus of IV lactated ringer  and chemical IV potassium chloride  in addition to IV Rocephin .  She will be admitted to a medical telemetry observation bed for further evaluation and management.  8/27.  Patient still lethargic.  Unable to take medications or eat. 8/28.  Patient lethargic.  Replacing magnesium  and potassium 8/29.  Patient still lethargic.  Replacing potassium.  Patient made a DO NOT RESUSCITATE. 8/30.  Patient awake and answering questions and able to hold her arms up off the bed.  Will get speech therapy evaluation.  Continue fluids and antibiotics today. 9/1.  Family interested in hospice evaluation.  Less responsive today.  Went to bed at 3 AM. 9/2.  Patient discharged home with hospice.  Roxanol if only gasping for breath.  Unclear how much she will eat and how  much of her medication she will take.  Dysphagia 2 diet.

## 2024-07-21 NOTE — TOC CM/SW Note (Signed)
 Transition of Care [facility]) - Inpatient Brief Assessment   Patient Details  Name: Deborah Velazquez MRN: [id number] Date of Birth: 11-18-1946  Transition of Care [facility]) CM/SW Contact:    Corean ONEIDA Haddock, RN Phone Number: 07/21/2024, 10:38 AM   Clinical Narrative:    Transition of Care [facility]) Screening Note   Patient Details  Name: Deborah Velazquez Date of Birth: 1946/03/25   Transition of Care [facility]) CM/SW Contact:    Corean ONEIDA Haddock, RN Phone Number: 07/21/2024, 10:38 AM    Transition of Care Department [facility]) has reviewed patient and no TOC needs have been identified at this time.  If new patient transition needs arise, please place a TOC consult.  Palliative consult for GOC pending Notified that patient is active with home health services through [facility]   Transition of Care Asessment: Insurance and Status: Insurance coverage has been reviewed Patient has primary care physician: Yes     Prior/Current Home Services: Current home services Social Drivers of Health Review: SDOH reviewed no interventions necessary Readmission risk has been reviewed: Yes Transition of care needs: no transition of care needs at this time

## 2024-07-21 NOTE — Plan of Care (Signed)
 Pt remains somnolent with brief response with mumbling without meaningful conversation. Once again, pt refuse to drink any liquid or take her night time meds.    Problem: Clinical Measurements: Goal: Will remain free from infection Outcome: Progressing   Problem: Elimination: Goal: Will not experience complications related to bowel motility Outcome: Progressing Goal: Will not experience complications related to urinary retention Outcome: Progressing   Problem: Pain Managment: Goal: General experience of comfort will improve and/or be controlled Outcome: Progressing

## 2024-07-21 NOTE — Assessment & Plan Note (Addendum)
 Mental status impaired again today.  Was good for the last 2 days but likely unable to keep up with nutritional needs.

## 2024-07-21 NOTE — Assessment & Plan Note (Signed)
 Stool studies negative

## 2024-07-21 NOTE — Progress Notes (Signed)
 Palliative Care Progress Note, Assessment & Plan   Patient Name: Deborah Velazquez       Date: 07/21/2024 DOB: 09/06/1946  Age: 78 y.o. MRN#: [id number] Attending Physician: Josette Ade, MD Primary Care Physician: Marylynn Verneita CROME, MD Admit Date: 07/19/2024  Subjective: Patient is lying in bed on her right side, asleep but mumbling.  She does not open her eyes or acknowledge my presence.  A close family friend is at bedside during my visit.  HPI: 78 y.o. female  with past medical history of anxiety, arthritis, hypercholesterolemia, hyperlipidemia, IBS, Parkinson disease, restless leg syndrome, TIA, and June 2024 hospitalization for UTI due to Klebsiella species admitted on 07/19/2024 with acute onset of generalized weakness and confusion outside of her baseline with decreased responsiveness.   Upon arrival to the ED, patient was hypertensive with hypokalemia.  IV fluids were given in addition to IV potassium chloride  and Rocephin .   UA was positive for UTI so urine culture was sent.   CT revealed no acute intracranial abnormalities.  Radiograph of chest revealed low lung volumes with no acute cardiopulmonary disease.   Patient is being treated for acute metabolic encephalopathy likely due to to lower UTI infection.   PMT was consulted to support patient and family care discussions.  Summary of counseling/coordination of care: Extensive chart review completed prior to meeting patient including labs, vital signs, imaging, progress notes, orders, and available advanced directive documents from current and previous encounters.   After reviewing the patient's chart and assessing the patient at bedside, I spoke with patient and family friend Deborah Velazquez in regards to symptom management and goals of care.    Deborah Velazquez shares that her mother is patient's HCPOA/best friend.  She shares they have a healthy understanding that patient has a progressive and irreversible disease.  I highlighted that her Parkinson's and perhaps parkinsonian dementia is often exacerbated by acute illnesses and hospitalizations.  Deborah Velazquez recalls that the time between urinary tract infections/dehydration and other hospitalizations has become shorter and shorter over time.  Additionally, she comments that the patient's recovery time from being loose and making sense to shutting down to be quiet has extended more into a lot of things.  I shared concern that patient is not eating and drinking, not able to take her regular medications (carbidopa  levodopa ) which contributes to an overall poor functional status.  Patient's functional, nutritional, and cognitive status are significant indicators of her overall prognosis.  Deborah Velazquez shares understanding and said that she remains hopeful that patient will be able to wake up and eat/drink.  However, she shares she is also very rational and reasonable and understands that this could be the patient is facing end-of-life.  Deborah Velazquez shares that patient has been Deborah Velazquez for the past year that she is tired, ready to see her mother (deceased) and has been making plans to ensure that her husband and family/friends will be taking care of when she passes.  Discussed that this is a version of patient conditioning that is often seated patient's start to transition to end-of-life.  Therapeutic silence, active listening, and emotional support provided.  Plan remains for watchful waiting and continue to treat the treatable.  No adjustment to plan of care  at this time.  PMT will continue to follow and support.  Physical Exam Vitals reviewed.  Constitutional:      General: She is not in acute distress.    Appearance: She is normal weight.  HENT:     Head: Normocephalic.     Mouth/Throat:     Mouth: Mucous  membranes are moist.  Pulmonary:     Effort: Pulmonary effort is normal.  Abdominal:     Palpations: Abdomen is soft.  Skin:    General: Skin is warm and dry.  Neurological:     Comments: Mumbles incoherent words, counts loudly but never opens eyes or engages in conversation  Psychiatric:        Judgment: Judgment normal.             Total Time 35 minutes   Time spent includes: Detailed review of medical records (labs, imaging, vital signs), medically appropriate exam (mental status, respiratory, cardiac, skin), discussed with treatment team, counseling and educating patient, family and staff, documenting clinical information, medication management and coordination of care.  Deborah L. Arvid, DNP, FNP-BC Palliative Medicine Team

## 2024-07-21 NOTE — Progress Notes (Signed)
 Progress Note   Patient: Deborah Velazquez FMW:[id number] DOB: Mar 25, 1946 DOA: 07/19/2024     1 DOS: the patient was seen and examined on 07/21/2024   Brief hospital course: 78 y.o. female with medical history significant for anxiety, dyslipidemia, osteoarthritis, RLS, CVA, seizure disorder and Parkinson's disease, who presented to the emergency room with acute onset of generalized weakness and confusion more than her baseline with decreased responsiveness.  She was admitted in June for Klebsiella UTI and bacteremia that was sensitive to ceftriaxone .  No reported fever or chills.  No reported nausea or vomiting or abdominal pain.  No reported chest pain or palpitations or cough or wheezing or dyspnea.  The patient was very somnolent and therefore not historian.   ED Course: When she came to the ER, BP was 146/103 with otherwise normal vital signs.  Labs revealed hypokalemia of 3.3 and CO2 21 with a -60 in the liter close and CBC was unremarkable.  Respiratory panel came back negative.  UA was positive for UTI.  Urine culture was sent. EKG as reviewed by me : EKG showed sinus rhythm with a rate of 88 with first-degree AV block Imaging: Noncontrasted CT scan showed no acute intracranial normalities chest x-ray showed low lung volumes with no acute cardiopulmonary disease.   The patient was given 2 L bolus of IV lactated ringer  and chemical IV potassium chloride  in addition to IV Rocephin .  She will be admitted to a medical telemetry observation bed for further evaluation and management.  8/27.  Patient still lethargic.  Unable to take medications or eat.  Assessment and Plan: * Acute metabolic encephalopathy Patient still lethargic.  Still not eating or taking medications.  Unable to make a disposition plan at this point  Acute lower UTI Unfortunately urine culture growing multiple species.  Will try to resend urine.  Continue Rocephin .  Hypokalemia Replace potassium in IV fluid  Parkinson's  disease (HCC) Sinemet  if able to take  Depression Continue meds if able to take  Diarrhea Stool studies negative  Peripheral neuropathy Continue meds if able to take  Lewy body dementia with mood disturbance (HCC) Currently patient not able to eat or take her medications.  Continue to monitor on antibiotics for urinary tract infection.        Subjective: Patient did not have a response to sternal rub but later was able to mumble a little bit and move her arms on her own.  Admitted with altered mental status and urinary infection  Physical Exam: Vitals:   07/20/24 2131 07/21/24 0413 07/21/24 0853 07/21/24 1528  BP: (!) 150/83 (!) 150/99 (!) 150/88 129/82  Pulse: 75 84 78 96  Resp: 16 16 14 16   Temp: 98.5 F (36.9 C) 98.5 F (36.9 C) 98.8 F (37.1 C) 99.2 F (37.3 C)  TempSrc: Oral  Oral Oral  SpO2: 95% 95% 94% 94%  Weight:       Physical Exam HENT:     Head: Normocephalic.  Eyes:     General: Lids are normal.     Conjunctiva/sclera: Conjunctivae normal.  Cardiovascular:     Rate and Rhythm: Normal rate and regular rhythm.     Heart sounds: S1 normal and S2 normal. Murmur heard.     Systolic murmur is present with a grade of 2/6.  Pulmonary:     Breath sounds: No decreased breath sounds, wheezing, rhonchi or rales.  Abdominal:     Palpations: Abdomen is soft.     Tenderness: There is no  abdominal tenderness.  Musculoskeletal:     Right lower leg: No swelling.     Left lower leg: No swelling.  Skin:    General: Skin is warm.     Findings: No rash.  Neurological:     Mental Status: She is lethargic.     Data Reviewed: Urine culture growing multiple species,  Potassium 3.3, creatinine 0.58, white blood cell count 9.0, hemoglobin 12.1, platelet count 308  Family Communication: Family at bedside  Disposition: Status is: Inpatient Remains inpatient appropriate because: Patient still lethargic.  Continue antibiotics and IV fluids.  Not eating.  Palliative  care following.  Planned Discharge Destination: Home with Home Health    Time spent: 28 minutes  Author: Charlie Patterson, MD 07/21/2024 4:44 PM  For on call review [email or web address].

## 2024-07-22 DIAGNOSIS — E876 Hypokalemia: Secondary | ICD-10-CM | POA: Diagnosis not present

## 2024-07-22 DIAGNOSIS — G629 Polyneuropathy, unspecified: Secondary | ICD-10-CM

## 2024-07-22 DIAGNOSIS — G9340 Encephalopathy, unspecified: Secondary | ICD-10-CM | POA: Diagnosis not present

## 2024-07-22 DIAGNOSIS — G3183 Dementia with Lewy bodies: Secondary | ICD-10-CM | POA: Diagnosis not present

## 2024-07-22 DIAGNOSIS — N3000 Acute cystitis without hematuria: Secondary | ICD-10-CM | POA: Diagnosis not present

## 2024-07-22 DIAGNOSIS — G9341 Metabolic encephalopathy: Secondary | ICD-10-CM | POA: Diagnosis not present

## 2024-07-22 DIAGNOSIS — N390 Urinary tract infection, site not specified: Secondary | ICD-10-CM | POA: Diagnosis not present

## 2024-07-22 LAB — URINALYSIS, W/ REFLEX TO CULTURE (INFECTION SUSPECTED)
Bilirubin Urine: NEGATIVE
Glucose, UA: NEGATIVE mg/dL
Ketones, ur: 80 mg/dL — AB
Nitrite: NEGATIVE
Protein, ur: NEGATIVE mg/dL
Specific Gravity, Urine: 1.013 (ref 1.005–1.030)
pH: 5 (ref 5.0–8.0)

## 2024-07-22 LAB — BASIC METABOLIC PANEL WITH GFR
Anion gap: 11 (ref 5–15)
BUN: 5 mg/dL — ABNORMAL LOW (ref 8–23)
CO2: 19 mmol/L — ABNORMAL LOW (ref 22–32)
Calcium: 9.1 mg/dL (ref 8.9–10.3)
Chloride: 111 mmol/L (ref 98–111)
Creatinine, Ser: 0.64 mg/dL (ref 0.44–1.00)
GFR, Estimated: >60 mL/min (ref >60)
Glucose, Bld: 95 mg/dL (ref 70–99)
Potassium: 3.3 mmol/L — ABNORMAL LOW (ref 3.5–5.1)
Sodium: 141 mmol/L (ref 135–145)

## 2024-07-22 LAB — GLUCOSE, CAPILLARY
Glucose-Capillary: 109 mg/dL — ABNORMAL HIGH (ref 70–99)
Glucose-Capillary: 115 mg/dL — ABNORMAL HIGH (ref 70–99)
Glucose-Capillary: 89 mg/dL (ref 70–99)
Glucose-Capillary: 93 mg/dL (ref 70–99)
Glucose-Capillary: 97 mg/dL (ref 70–99)

## 2024-07-22 LAB — PHOSPHORUS: Phosphorus: 2.9 mg/dL (ref 2.5–4.6)

## 2024-07-22 LAB — MAGNESIUM: Magnesium: 1.8 mg/dL (ref 1.7–2.4)

## 2024-07-22 MED ORDER — MAGNESIUM SULFATE 2 GM/50ML IV SOLN
2.0000 g | Freq: Once | INTRAVENOUS | Status: AC
  Administered 2024-07-22: 2 g via INTRAVENOUS
  Filled 2024-07-22: qty 50

## 2024-07-22 MED ORDER — POTASSIUM CHLORIDE 10 MEQ/100ML IV SOLN
10.0000 meq | INTRAVENOUS | Status: AC
  Administered 2024-07-22 (×2): 10 meq via INTRAVENOUS
  Filled 2024-07-22: qty 100

## 2024-07-22 MED ORDER — POTASSIUM CHLORIDE IN NACL 20-0.9 MEQ/L-% IV SOLN
INTRAVENOUS | Status: DC
  Filled 2024-07-22: qty 1000

## 2024-07-22 MED ORDER — KCL IN DEXTROSE-NACL 20-5-0.9 MEQ/L-%-% IV SOLN
INTRAVENOUS | Status: DC
  Filled 2024-07-22 (×3): qty 1000

## 2024-07-22 NOTE — Progress Notes (Signed)
 Palliative Care Progress Note, Assessment & Plan   Patient Name: Deborah Velazquez       Date: 07/22/2024 DOB: 03/17/1946  Age: 78 y.o. MRN#: [id number] Attending Physician: Josette Ade, MD Primary Care Physician: Marylynn Verneita CROME, MD Admit Date: 07/19/2024  Subjective: Patient lying in bed, sleeping.  She does not awaken to my presence.  She remains unable to make her wishes known.  Her close family (daughter [name] is at bedside during my visit.  HPI: 78 y.o. female  with past medical history of anxiety, arthritis, hypercholesterolemia, hyperlipidemia, IBS, Parkinson disease, restless leg syndrome, TIA, and June 2024 hospitalization for UTI due to Klebsiella species admitted on 07/19/2024 with acute onset of generalized weakness and confusion outside of her baseline with decreased responsiveness.   Upon arrival to the ED, patient was hypertensive with hypokalemia.  IV fluids were given in addition to IV potassium chloride  and Rocephin .   UA was positive for UTI so urine culture was sent.   CT revealed no acute intracranial abnormalities.  Radiograph of chest revealed low lung volumes with no acute cardiopulmonary disease.   Patient is being treated for acute metabolic encephalopathy likely due to to lower UTI infection.   PMT was consulted to support patient and family care discussions.  Summary of counseling/coordination of care: Extensive chart review completed prior to meeting patient including labs, vital signs, imaging, progress notes, orders, and available advanced directive documents from current and previous encounters.   After reviewing the patient's chart and assessing the patient at bedside, I spoke with patient's friend [name] in regards to symptom management and goals of care.    Which indicated Michelle's understanding of patient's current medical situation.  Discussed that patient continues to refuse p.o. intake and is being hydrated artificially via IVs.  [name] shares a realistic understanding that patient may not improve.  She says family is fully aware that if patient does not wake up and they are prepared to take her home.  If she begins to show signs of suffering at home, they would call hospice.  She shares they want to honor her wishes of having her at home.  Additionally, [name] shares that she and family want to continue to see what antibiotics if any remain appropriate to help treat any remaining UTI.  She shares that once patient has been given current treatment and if she still remains a somnolent and unable to take in p.o.'s, they are prepared to accept that patient is facing end-of-life.  Essentially, [name] and family want to give patient the best shot at that treating UTI to see if this is impacting her cognitive status.  Expressed that I remain hopeful but also greatly concerned that patient may not recover.  This could be a response to her progressive, irreversible Lewy body dementia and Parkinson's.  [name] shares understanding.  Human mortality, faith in God, and remaining hopeful yet realistic reviewed in great detail.  Therapeutic silence, active listening, and emotional support provided.  Watchful waiting to continue.  PMT will continue to follow and support.  No adjustment to plan of care at this time.  Physical Exam Vitals reviewed.  Constitutional:      General: She is not  in acute distress. HENT:     Head: Normocephalic.  Pulmonary:     Effort: Pulmonary effort is normal.  Abdominal:     Palpations: Abdomen is soft.  Skin:    General: Skin is warm and dry.  Neurological:     Comments: nonverbal             Total Time 35 minutes   Time spent includes: Detailed review of medical records (labs, imaging, vital signs),  medically appropriate exam (mental status, respiratory, cardiac, skin), discussed with treatment team, counseling and educating patient, family and staff, documenting clinical information, medication management and coordination of care.  Lamarr L. Arvid, DNP, FNP-BC Palliative Medicine Team

## 2024-07-22 NOTE — Plan of Care (Signed)
  Problem: Education: Goal: Knowledge of General Education information will improve Description: Including pain rating scale, medication(s)/side effects and non-pharmacologic comfort measures Outcome: Progressing   Problem: Health Behavior/Discharge Planning: Goal: Ability to manage health-related needs will improve Outcome: Progressing   Problem: Clinical Measurements: Goal: Ability to maintain clinical measurements within normal limits will improve Outcome: Progressing Goal: Will remain free from infection Outcome: Progressing Goal: Diagnostic test results will improve Outcome: Progressing Goal: Respiratory complications will improve Outcome: Progressing Goal: Cardiovascular complication will be avoided Outcome: Progressing   Problem: Elimination: Goal: Will not experience complications related to bowel motility Outcome: Progressing Goal: Will not experience complications related to urinary retention Outcome: Progressing   Problem: Pain Managment: Goal: General experience of comfort will improve and/or be controlled Outcome: Progressing   Problem: Safety: Goal: Ability to remain free from injury will improve Outcome: Progressing   Problem: Skin Integrity: Goal: Risk for impaired skin integrity will decrease Outcome: Progressing

## 2024-07-22 NOTE — Care Management Important Message (Signed)
 Important Message  Patient Details  Name: Deborah Velazquez MRN: [id number] Date of Birth: 01/04/1946   Important Message Given:  Yes - Medicare IM     Rojelio [name] 07/22/2024, 12:45 PM

## 2024-07-22 NOTE — Assessment & Plan Note (Signed)
 Replaced

## 2024-07-22 NOTE — Progress Notes (Signed)
 Progress Note   Patient: Deborah Velazquez FMW:[id number] DOB: 1946-06-26 DOA: 07/19/2024     2 DOS: the patient was seen and examined on 07/22/2024   Brief hospital course: 78 y.o. female with medical history significant for anxiety, dyslipidemia, osteoarthritis, RLS, CVA, seizure disorder and Parkinson's disease, who presented to the emergency room with acute onset of generalized weakness and confusion more than her baseline with decreased responsiveness.  She was admitted in June for Klebsiella UTI and bacteremia that was sensitive to ceftriaxone .  No reported fever or chills.  No reported nausea or vomiting or abdominal pain.  No reported chest pain or palpitations or cough or wheezing or dyspnea.  The patient was very somnolent and therefore not historian.   ED Course: When she came to the ER, BP was 146/103 with otherwise normal vital signs.  Labs revealed hypokalemia of 3.3 and CO2 21 with a -60 in the liter close and CBC was unremarkable.  Respiratory panel came back negative.  UA was positive for UTI.  Urine culture was sent. EKG as reviewed by me : EKG showed sinus rhythm with a rate of 88 with first-degree AV block Imaging: Noncontrasted CT scan showed no acute intracranial normalities chest x-ray showed low lung volumes with no acute cardiopulmonary disease.   The patient was given 2 L bolus of IV lactated ringer  and chemical IV potassium chloride  in addition to IV Rocephin .  She will be admitted to a medical telemetry observation bed for further evaluation and management.  8/27.  Patient still lethargic.  Unable to take medications or eat.  Assessment and Plan: * Acute metabolic encephalopathy Patient still lethargic.  Still not eating enough to survive or taking medications.  Unable to make a disposition plan at this point.  Palliative care following.  Patient is a full code at this point.  Case discussed with family at bedside.  Hypomagnesemia IV magnesium  today  Acute lower  UTI Unfortunately urine culture growing multiple species.  Continue Rocephin .  Repeat urine analysis negative.  Hypokalemia Replace potassium in IV fluid and 2 potassium runs today  Parkinson's disease (HCC) Sinemet  if able to take  Depression Continue meds if able to take  Diarrhea Stool studies negative  Peripheral neuropathy Continue meds if able to take  Lewy body dementia with mood disturbance (HCC) Currently patient not able to eat enough to maintain nutritional status or take her medications.  Continue to monitor on antibiotics for urinary tract infection.        Subjective: As per family he was able to drink a little bit and have a little bit of pudding yesterday.  Today as of this morning unable to eat.  Still minimally responsive.  Moves her arm on her own.  Admitted with altered mental status.  Physical Exam: Vitals:   07/21/24 1528 07/21/24 1946 07/22/24 0346 07/22/24 0734  BP: 129/82 (!) 139/97 (!) 144/91 (!) 150/80  Pulse: 96 81 81 82  Resp: 16 16 16 16   Temp: 99.2 F (37.3 C) 99.2 F (37.3 C) 99 F (37.2 C) 98.6 F (37 C)  TempSrc: Oral Oral Oral Axillary  SpO2: 94% 95% 96% 94%  Weight:       Physical Exam HENT:     Head: Normocephalic.  Eyes:     General: Lids are normal.     Conjunctiva/sclera: Conjunctivae normal.  Cardiovascular:     Rate and Rhythm: Normal rate and regular rhythm.     Heart sounds: S1 normal and S2 normal. Murmur  heard.     Systolic murmur is present with a grade of 2/6.  Pulmonary:     Breath sounds: No decreased breath sounds, wheezing, rhonchi or rales.  Abdominal:     Palpations: Abdomen is soft.     Tenderness: There is no abdominal tenderness.  Musculoskeletal:     Right lower leg: No swelling.     Left lower leg: No swelling.  Skin:    General: Skin is warm.     Findings: No rash.  Neurological:     Mental Status: She is lethargic.     Comments: Today able to move her right arm on her own.     Data  Reviewed: Potassium 3.3, creatinine 0.64, magnesium  1.8  Family Communication: Family at bedside  Disposition: Status is: Inpatient Remains inpatient appropriate because: Patient still unresponsive.  Continue IV Rocephin   Planned Discharge Destination: Home    Time spent: 28 minutes  Author: Charlie Patterson, MD 07/22/2024 1:01 PM  For on call review [email or web address].

## 2024-07-23 ENCOUNTER — Inpatient Hospital Stay

## 2024-07-23 DIAGNOSIS — N3000 Acute cystitis without hematuria: Secondary | ICD-10-CM | POA: Diagnosis not present

## 2024-07-23 DIAGNOSIS — G9341 Metabolic encephalopathy: Secondary | ICD-10-CM | POA: Diagnosis not present

## 2024-07-23 DIAGNOSIS — G3183 Dementia with Lewy bodies: Secondary | ICD-10-CM | POA: Diagnosis not present

## 2024-07-23 DIAGNOSIS — E876 Hypokalemia: Secondary | ICD-10-CM | POA: Diagnosis not present

## 2024-07-23 DIAGNOSIS — G9340 Encephalopathy, unspecified: Secondary | ICD-10-CM | POA: Diagnosis not present

## 2024-07-23 DIAGNOSIS — N390 Urinary tract infection, site not specified: Secondary | ICD-10-CM | POA: Diagnosis not present

## 2024-07-23 LAB — BASIC METABOLIC PANEL WITH GFR
Anion gap: 11 (ref 5–15)
BUN: <5 mg/dL — ABNORMAL LOW (ref 8–23)
CO2: 20 mmol/L — ABNORMAL LOW (ref 22–32)
Calcium: 8.8 mg/dL — ABNORMAL LOW (ref 8.9–10.3)
Chloride: 108 mmol/L (ref 98–111)
Creatinine, Ser: 0.44 mg/dL (ref 0.44–1.00)
GFR, Estimated: >60 mL/min (ref >60)
Glucose, Bld: 126 mg/dL — ABNORMAL HIGH (ref 70–99)
Potassium: 3.4 mmol/L — ABNORMAL LOW (ref 3.5–5.1)
Sodium: 139 mmol/L (ref 135–145)

## 2024-07-23 LAB — MAGNESIUM: Magnesium: 2.2 mg/dL (ref 1.7–2.4)

## 2024-07-23 LAB — GLUCOSE, CAPILLARY
Glucose-Capillary: 114 mg/dL — ABNORMAL HIGH (ref 70–99)
Glucose-Capillary: 123 mg/dL — ABNORMAL HIGH (ref 70–99)
Glucose-Capillary: 118 mg/dL — ABNORMAL HIGH (ref 70–99)
Glucose-Capillary: 122 mg/dL — ABNORMAL HIGH (ref 70–99)
Glucose-Capillary: 140 mg/dL — ABNORMAL HIGH (ref 70–99)
Glucose-Capillary: 125 mg/dL — ABNORMAL HIGH (ref 70–99)

## 2024-07-23 LAB — CBC
HCT: 38.4 % (ref 36.0–46.0)
Hemoglobin: 12.4 g/dL (ref 12.0–15.0)
MCH: 27.2 pg (ref 26.0–34.0)
MCHC: 32.3 g/dL (ref 30.0–36.0)
MCV: 84.2 fL (ref 80.0–100.0)
Platelets: 310 K/uL (ref 150–400)
RBC: 4.56 MIL/uL (ref 3.87–5.11)
RDW: 15.9 % — ABNORMAL HIGH (ref 11.5–15.5)
WBC: 7.4 K/uL (ref 4.0–10.5)
nRBC: 0 % (ref 0.0–0.2)

## 2024-07-23 MED ORDER — KCL IN DEXTROSE-NACL 20-5-0.9 MEQ/L-%-% IV SOLN
INTRAVENOUS | Status: DC
  Filled 2024-07-23 (×2): qty 1000

## 2024-07-23 NOTE — Plan of Care (Deleted)

## 2024-07-23 NOTE — Progress Notes (Signed)
 Progress Note   Patient: Deborah Velazquez FMW:[id number] DOB: Apr 23, 1946 DOA: 07/19/2024     3 DOS: the patient was seen and examined on 07/23/2024   Brief hospital course: 78 y.o. female with medical history significant for anxiety, dyslipidemia, osteoarthritis, RLS, CVA, seizure disorder and Parkinson's disease, who presented to the emergency room with acute onset of generalized weakness and confusion more than her baseline with decreased responsiveness.  She was admitted in June for Klebsiella UTI and bacteremia that was sensitive to ceftriaxone .  No reported fever or chills.  No reported nausea or vomiting or abdominal pain.  No reported chest pain or palpitations or cough or wheezing or dyspnea.  The patient was very somnolent and therefore not historian.   ED Course: When she came to the ER, BP was 146/103 with otherwise normal vital signs.  Labs revealed hypokalemia of 3.3 and CO2 21 with a -60 in the liter close and CBC was unremarkable.  Respiratory panel came back negative.  UA was positive for UTI.  Urine culture was sent. EKG as reviewed by me : EKG showed sinus rhythm with a rate of 88 with first-degree AV block Imaging: Noncontrasted CT scan showed no acute intracranial normalities chest x-ray showed low lung volumes with no acute cardiopulmonary disease.   The patient was given 2 L bolus of IV lactated ringer  and chemical IV potassium chloride  in addition to IV Rocephin .  She will be admitted to a medical telemetry observation bed for further evaluation and management.  8/27.  Patient still lethargic.  Unable to take medications or eat. 8/28.  Patient lethargic.  Replacing magnesium  and potassium 8/29.  Patient still lethargic.  Replacing potassium  Assessment and Plan: * Acute metabolic encephalopathy Patient still lethargic.  Still not eating enough to survive or taking medications.  Unable to make a disposition plan at this point.  Palliative care following.  Patient is a full  code at this point.  Case discussed with family at bedside.  Patient's family will have a meeting to determine the next steps.  Hypomagnesemia Replaced  Acute lower UTI Unfortunately initial urine culture growing multiple species.  Repeat urine analysis negative.  Rocephin  probably working since repeat urine analysis was negative.  Continue Rocephin  for now.  Hypokalemia Replace with IV fluid  Parkinson's disease (HCC) Sinemet  if able to take  Depression Continue meds if able to take  Diarrhea Stool studies negative  Peripheral neuropathy Continue meds if able to take  Lewy body dementia with mood disturbance (HCC) Currently patient not able to eat enough to maintain nutritional status or take her medications.  Continue to monitor on antibiotics for urinary tract infection.        Subjective: Patient lying comfortably in bed.  Refuses me opening up her eyes.  Has not been responding well last few days when I came in.  Admitted with altered mental status.  Physical Exam: Vitals:   07/22/24 1931 07/22/24 2011 07/23/24 0357 07/23/24 0734  BP: 122/74 (!) 149/89 (!) 154/96 (!) 156/88  Pulse: 97 93 88 86  Resp: 16 16 16 16   Temp: 98.8 F (37.1 C) 99.6 F (37.6 C) 99.3 F (37.4 C) 98.9 F (37.2 C)  TempSrc: Oral   Axillary  SpO2: 98% 98% 95% 96%  Weight:       Physical Exam HENT:     Head: Normocephalic.  Eyes:     General: Lids are normal.     Conjunctiva/sclera: Conjunctivae normal.  Cardiovascular:  Rate and Rhythm: Normal rate and regular rhythm.     Heart sounds: S1 normal and S2 normal. Murmur heard.     Systolic murmur is present with a grade of 2/6.  Pulmonary:     Breath sounds: No decreased breath sounds, wheezing, rhonchi or rales.  Abdominal:     Palpations: Abdomen is soft.     Tenderness: There is no abdominal tenderness.  Musculoskeletal:     Right lower leg: No swelling.     Left lower leg: No swelling.  Skin:    General: Skin is warm.      Findings: No rash.  Neurological:     Mental Status: She is lethargic.     Data Reviewed: Potassium 3.4, creatinine 0.44, CBC normal range  Family Communication: Family at bedside  Disposition: Status is: Inpatient Remains inpatient appropriate because: Patient still very lethargic  Planned Discharge Destination: To be determined    Time spent: 28 minutes  Author: Charlie Patterson, MD 07/23/2024 12:10 PM  For on call review [email or web address].

## 2024-07-23 NOTE — Plan of Care (Signed)
  Problem: Education: Goal: Knowledge of General Education information will improve Description: Including pain rating scale, medication(s)/side effects and non-pharmacologic comfort measures 07/23/2024 1726 by Paulo Medford LITTIE Mickey., RN Outcome: Progressing 07/23/2024 1725 by Paulo Medford LITTIE Mickey., RN Outcome: Progressing   Problem: Health Behavior/Discharge Planning: Goal: Ability to manage health-related needs will improve Outcome: Progressing   Problem: Clinical Measurements: Goal: Ability to maintain clinical measurements within normal limits will improve 07/23/2024 1726 by Paulo Medford LITTIE Mickey., RN Outcome: Progressing 07/23/2024 1725 by Paulo Medford LITTIE Mickey., RN Outcome: Progressing Goal: Will remain free from infection Outcome: Progressing Goal: Diagnostic test results will improve 07/23/2024 1726 by Paulo Medford LITTIE Mickey., RN Outcome: Progressing 07/23/2024 1725 by Paulo Medford LITTIE Mickey., RN Outcome: Progressing Goal: Respiratory complications will improve 07/23/2024 1726 by Paulo Medford LITTIE Mickey., RN Outcome: Progressing 07/23/2024 1725 by Paulo Medford LITTIE Mickey., RN Outcome: Progressing Goal: Cardiovascular complication will be avoided 07/23/2024 1726 by Paulo Medford LITTIE Mickey., RN Outcome: Progressing 07/23/2024 1725 by Paulo Medford LITTIE Mickey., RN Outcome: Progressing   Problem: Elimination: Goal: Will not experience complications related to bowel motility Outcome: Progressing Goal: Will not experience complications related to urinary retention Outcome: Progressing   Problem: Pain Managment: Goal: General experience of comfort will improve and/or be controlled Outcome: Progressing   Problem: Safety: Goal: Ability to remain free from injury will improve Outcome: Progressing

## 2024-07-23 NOTE — Progress Notes (Addendum)
 Palliative Care Progress Note, Assessment & Plan   Patient Name: Deborah Velazquez       Date: 07/23/2024 DOB: 1946/10/23  Age: 78 y.o. MRN#: [id number] Attending Physician: Josette Ade, MD Primary Care Physician: Marylynn Verneita CROME, MD Admit Date: 07/19/2024  Subjective: Patient is lying in bed, sleeping.  Noted dressed noted.  She does not awaken to my presence, physical likes stimuli, or verbal stimuli.  No family or friends present at bedside during my visit.  HPI: 78 y.o. female  with past medical history of anxiety, arthritis, hypercholesterolemia, hyperlipidemia, IBS, Parkinson disease, Lewy Body dementia, restless leg syndrome, TIA, and June 2024 hospitalization for UTI due to Klebsiella species admitted on 07/19/2024 with acute onset of generalized weakness and confusion outside of her baseline with decreased responsiveness.   Upon arrival to the ED, patient was hypertensive with hypokalemia.  IV fluids were given in addition to IV potassium chloride  and Rocephin .   UA was positive for UTI so urine culture was sent.   CT revealed no acute intracranial abnormalities.  Radiograph of chest revealed low lung volumes with no acute cardiopulmonary disease.   Patient is being treated for acute metabolic encephalopathy likely due to to lower UTI infection.   PMT was consulted to support patient and family care discussions.  Summary of counseling/coordination of care: Extensive chart review completed prior to meeting patient including labs, vital signs, imaging, progress notes, orders, and available advanced directive documents from current and previous encounters.   After reviewing the patient's chart and assessing the patient at bedside, I did speak with patient's family friend [name].  No answer.   Unable to leave a voicemail.  I am concerned the patient remains a full code.  Patient's family has expressed understanding of her incurable, irreversible disease that is progressing.  However, they have shared in the past that since she has a strong heart they do not see any recent to avoid's cardiopulmonary resuscitative efforts.  I will attempt to speak with family again today in order to discuss boundaries of care.  Otherwise, full code remains.   Counseled with attending.  Family is considering returning home with palliative or hospice services.  They plan to speak privately and then notify attending of their decision.  Addendum: Spoke with patient's HCPOA's daughter [name].  [name] plans to meet with patient's husband patient's/HCPOA Rock this evening.  She inquired about hospice services at home.  Hospice services and philosophy reviewed in detail.  Additionally, I discussed that comfort focused care can be initiated in the hospital.  Comfort measures discussed in detail.  [name] was given the opportunity and space to ask questions and voiced her concerns.  She was appreciative of the information and plans to speak with patient's husband and Rock at this evening.  [name] has PMT contact info and was advised to reach out to the palliative team for any acute needs arise.  [name] aware that I am off service after today but that my colleagues remain available to continue goals of care discussions when appropriate.  Physical Exam Vitals reviewed.  Constitutional:      Appearance: She is normal weight.  HENT:     Head: Normocephalic and atraumatic.  Mouth/Throat:     Mouth: Mucous membranes are moist.  Eyes:     Pupils: Pupils are equal, round, and reactive to light.  Cardiovascular:     Rate and Rhythm: Normal rate.  Pulmonary:     Effort: Pulmonary effort is normal.  Skin:    General: Skin is warm and dry.     Coloration: Skin is pale.             Total Time 50  minutes   Time spent includes: Detailed review of medical records (labs, imaging, vital signs), medically appropriate exam (mental status, respiratory, cardiac, skin), discussed with treatment team, counseling and educating patient, family and staff, documenting clinical information, medication management and coordination of care.  Lamarr L. Arvid, DNP, FNP-BC Palliative Medicine Team

## 2024-07-23 NOTE — Progress Notes (Signed)
 Patient made DNR with husband and [name]. Dr Josette

## 2024-07-24 DIAGNOSIS — E876 Hypokalemia: Secondary | ICD-10-CM | POA: Diagnosis not present

## 2024-07-24 DIAGNOSIS — Z789 Other specified health status: Secondary | ICD-10-CM | POA: Diagnosis not present

## 2024-07-24 DIAGNOSIS — Z66 Do not resuscitate: Secondary | ICD-10-CM

## 2024-07-24 DIAGNOSIS — Z515 Encounter for palliative care: Secondary | ICD-10-CM | POA: Diagnosis not present

## 2024-07-24 DIAGNOSIS — N3000 Acute cystitis without hematuria: Secondary | ICD-10-CM | POA: Diagnosis not present

## 2024-07-24 DIAGNOSIS — G9340 Encephalopathy, unspecified: Secondary | ICD-10-CM | POA: Diagnosis not present

## 2024-07-24 DIAGNOSIS — N390 Urinary tract infection, site not specified: Secondary | ICD-10-CM | POA: Diagnosis not present

## 2024-07-24 DIAGNOSIS — G9341 Metabolic encephalopathy: Secondary | ICD-10-CM | POA: Diagnosis not present

## 2024-07-24 LAB — GLUCOSE, CAPILLARY
Glucose-Capillary: 140 mg/dL — ABNORMAL HIGH (ref 70–99)
Glucose-Capillary: 114 mg/dL — ABNORMAL HIGH (ref 70–99)
Glucose-Capillary: 152 mg/dL — ABNORMAL HIGH (ref 70–99)
Glucose-Capillary: 119 mg/dL — ABNORMAL HIGH (ref 70–99)

## 2024-07-24 LAB — POCT CBG MONITORING
Glucose-Capillary: 115
Glucose-Capillary: 124
Glucose-Capillary: 129

## 2024-07-24 MED ORDER — KCL IN DEXTROSE-NACL 20-5-0.9 MEQ/L-%-% IV SOLN
INTRAVENOUS | Status: DC
  Filled 2024-07-24 (×2): qty 1000

## 2024-07-24 NOTE — Progress Notes (Signed)
 Patient is alert this morning. She is able to engage in small conversation with nurse and caregivers. She is pleasant and laughing at most things. She was able to eat chocolate pudding and some greek yogurt. She has had a few sips of green tea and ginger-ale as well.

## 2024-07-24 NOTE — Progress Notes (Addendum)
 Palliative Care Progress Note, Assessment & Plan   Patient Name: Deborah Velazquez       Date: 07/24/2024 DOB: 05-23-1946  Age: 78 y.o. MRN#: [id number] Attending Physician: Josette Ade, MD Primary Care Physician: Marylynn Verneita CROME, MD Admit Date: 07/19/2024  Subjective: Feels ok today but tired. Ate chocolate pudding and yogurt for breakfast today. Did not sleep well last night. Denies pain or SOB.   HPI: Per previous HPI: 78 y.o. female  with past medical history of anxiety, arthritis, hypercholesterolemia, hyperlipidemia, IBS, Parkinson disease, Lewy Body dementia, restless leg syndrome, TIA, and June 2024 hospitalization for UTI due to Klebsiella species admitted on 07/19/2024 with acute onset of generalized weakness and confusion outside of her baseline with decreased responsiveness.   Upon arrival to the ED, patient was hypertensive with hypokalemia.  IV fluids were given in addition to IV potassium chloride  and Rocephin .   UA was positive for UTI so urine culture was sent.   CT revealed no acute intracranial abnormalities.  Radiograph of chest revealed low lung volumes with no acute cardiopulmonary disease.   Patient is being treated for acute metabolic encephalopathy likely due to to lower UTI infection.   PMT was consulted to support patient and family care discussions.  8/29: Family agreed to DNR after meeting with Dr. Josette   Summary of counseling/coordination of care: Extensive chart review completed prior to meeting patient including labs, vital signs, imaging, progress notes, orders, and available advanced directive documents from current and previous encounters.   After reviewing the patient's chart and assessing the patient at bedside, I spoke with patient and family in regards to  symptom management and goals of care.   Ill-appearing female resting in bed with niece and friend at bedside. She is alert to self but is still confused at times. Patient is able to answer some questions with delayed response. Noted to be laughing with family. She denies pain or SOB. Respirations are even and unlabored. She is in no distress.   Deborah Velazquez, friend, at bedside shares that she feels patient is much better today after eating some pudding and yogurt this morning. Deborah Velazquez shares that she understands that even though she is better, she is reasonable and understands patient's poor prognosis due to progressive Parkinson's and dementia. Deborah Velazquez states that she and patient's husband are meeting with Dr. Josette have planned family meeting this morning. She is hopeful that meeting with help patient's husband come to terms with the fact that his wife will most likely not return to her normal function.   Therapeutic silence and active listening provided for patient and family to share their thoughts and emotions regarding current medical situation.  Emotional support provided.  Physical Exam Vitals reviewed.  Constitutional:      General: She is not in acute distress.    Appearance: She is ill-appearing.  HENT:     Head: Normocephalic and atraumatic.  Pulmonary:     Effort: Pulmonary effort is normal. No respiratory distress.  Abdominal:     General: There is no distension.     Palpations: Abdomen is soft.     Tenderness: There is no abdominal tenderness. There is no guarding.  Musculoskeletal:  Right lower leg: No edema.     Left lower leg: No edema.  Skin:    General: Skin is warm and dry.     Coloration: Skin is pale.  Neurological:     Mental Status: She is alert. She is disoriented.  Psychiatric:        Mood and Affect: Mood normal.        Behavior: Behavior normal.     Recommendations/Plan: Continue DNR/DNI  Continue current supportive interventions PMT will continue  to follow for needs        Total Time 50 minutes   Time spent includes: Detailed review of medical records (labs, imaging, vital signs), medically appropriate exam (mental status, respiratory, cardiac, skin), discussed with treatment team, counseling and educating patient, family and staff, documenting clinical information, medication management and coordination of care.     Devere Sacks, ELNITA- [facility] Palliative Medicine Team  07/24/2024 9:12 AM  Office [phone]  Pager [phone]

## 2024-07-24 NOTE — Evaluation (Signed)
 Physical Therapy Evaluation Patient Details Name: Deborah Velazquez MRN: [id number] DOB: 04-13-1946 Today's Date: 07/24/2024  History of Present Illness  presented to ER secondary to progressive weakness, AMS, decreased responsiveness; admitted for management of acute metabolic encephalopathy secondary to UTI.  Clinical Impression  Patient resting in bed upon arrival to room; multiple family members present at bedside.  Patient oriented to self, location as hospital; inconsistently follows simple, verbal commands with increased time for processing and task initiation.  Occasional facial grimacing with passive movement of UEs (L > R), resolves with rest and repositioning. Globally weak and deconditioned throughout all extremities, demonstrating very limited active movement efforts in any extremity throughout session (see details below).  Significant tone/rigidity throughout UEs/LEs (family reports missing carvidopa/levodopa  for several days).   Currently requiring total assist +2 for all bed mobility, repositioning and overall care needs; requiring hand-over-hand assist to facilitate active effort within ability.  Unsafe/unable to attempt OOB efforts this date. Did Transition to chair position in bed, bilat heels floated and LEs positioned in neutral rotation.  Pillows placed under R trunk/scapular complex/neck to promote neutral postural alignment and improved head positioning.  Patient tolerating position without difficulty; more engaged for participate with SLP assessment. Would benefit from skilled PT to address above deficits and promote optimal return to PLOF.; recommend post-acute PT follow up as indicated by interdisciplinary care team.   Will continue to monitor discussions re: goals of care and update therapy POC as appropriate.       If plan is discharge home, recommend the following: Two people to help with bathing/dressing/bathroom;Two people to help with walking and/or transfers;Assistance  with feeding;Direct supervision/assist for medications management;Direct supervision/assist for financial management;Help with stairs or ramp for entrance   Can travel by private vehicle        Equipment Recommendations    Recommendations for Other Services       Functional Status Assessment Patient has had a recent decline in their functional status and/or demonstrates limited ability to make significant improvements in function in a reasonable and predictable amount of time     Precautions / Restrictions Precautions Precautions: Fall Restrictions Weight Bearing Restrictions Per Provider Order: No      Mobility  Bed Mobility Overal bed mobility: Needs Assistance Bed Mobility: Rolling Rolling: +2 for physical assistance, Total assist         General bed mobility comments: rolling, scooting up, repositioning in bed, total assist +2    Transfers                   General transfer comment: deferred this date due to level of assist required, and SLP assessment prior to lunch    Ambulation/Gait               General Gait Details: unable/unsafe  Stairs            Wheelchair Mobility     Tilt Bed    Modified Rankin (Stroke Patients Only)       Balance                                             Pertinent Vitals/Pain Pain Assessment Pain Assessment: Faces Breathing: normal Negative Vocalization: none Facial Expression: sad, frightened, frown Body Language: relaxed Consolability: no need to console PAINAD Score: 1    Home Living Family/patient expects to be discharged to::  Private residence Living Arrangements: Spouse/significant other;Children;Non-relatives/Friends Available Help at Discharge: Family;Personal care attendant;Available 24 hours/day Type of Home: House Home Access: Ramped entrance       Home Layout: One level Home Equipment: Agricultural consultant (2 wheels);Rollator (4 wheels);BSC/3in1;Wheelchair -  manual;Shower seat;Hospital bed;Hand held shower head;Grab bars - tub/shower;Other (comment)      Prior Function Prior Level of Function : Needs assist             Mobility Comments: Has to ability to intermittently walk ~ 42ft with RW and 2+ assist for increased safety, dependent on good/bad day; other days, globally fatigued and max/total care for bed-level activities.       Extremity/Trunk Assessment   Upper Extremity Assessment Upper Extremity Assessment: Generalized weakness (grossly 2-/5 throughout; significant one/rigidity throughout. Minimal attempts at active movement, demonstrating only partial range with gross grasp and partial range with elbow flexion)    Lower Extremity Assessment Lower Extremity Assessment: Generalized weakness (significant one/rigidity throughout. Minimal attempts at active movement, demonstrating only partial range with wiggling toes and PF/DF ankles.  Limited tolerance (or available ROM) for bilat hip/knee flexion, R > L, requiring passive assist for all ROM)    Cervical / Trunk Assessment Cervical / Trunk Assessment:  (torticollis to R with excessive forward head posturing)  Communication   Communication Communication: Impaired Factors Affecting Communication: Difficulty expressing self    Cognition Arousal: Alert Behavior During Therapy: Flat affect   PT - Cognitive impairments: History of cognitive impairments                       PT - Cognition Comments: Oriented to self, inconsistently follows simple commands         Cueing       General Comments      Exercises  Transitioned to chair position in bed, bilat heels floated and LEs positioned in neutral rotation.  Pillows placed under R trunk/scapular complex/neck to promote neutral postural alignment and improved head positioning.  Patient tolerating position without difficulty; more engaged for participate with SLP assessment.   Assessment/Plan    PT Assessment  Patient needs continued PT services  PT Problem List Decreased strength;Decreased range of motion;Decreased activity tolerance;Decreased balance;Decreased mobility;Decreased coordination;Decreased cognition;Decreased knowledge of use of DME;Decreased safety awareness;Decreased knowledge of precautions;Cardiopulmonary status limiting activity;Impaired tone;Pain       PT Treatment Interventions DME instruction;Functional mobility training;Therapeutic exercise;Therapeutic activities;Balance training;Neuromuscular re-education;Cognitive remediation;Gait training;Patient/family education    PT Goals (Current goals can be found in the Care Plan section)  Acute Rehab PT Goals Patient Stated Goal: agreeable to session-that sounds like a good idea PT Goal Formulation: With patient/family Time For Goal Achievement: 08/07/24 Potential to Achieve Goals: Fair Additional Goals Additional Goal #1: Assess and establish goals for standing/OOB activities as appropriate    Frequency Min 2X/week     Co-evaluation               AM-PAC PT 6 Clicks Mobility  Outcome Measure Help needed turning from your back to your side while in a flat bed without using bedrails?: Total Help needed moving from lying on your back to sitting on the side of a flat bed without using bedrails?: Total Help needed moving to and from a bed to a chair (including a wheelchair)?: Total Help needed standing up from a chair using your arms (e.g., wheelchair or bedside chair)?: Total Help needed to walk in hospital room?: Total Help needed climbing 3-5 steps with a railing? : Total  6 Click Score: 6    End of Session   Activity Tolerance: Patient limited by fatigue Patient left: in bed;with bed alarm set;with call Weiler/phone within reach;with family/visitor present (SLP at bedside)   PT Visit Diagnosis: Muscle weakness (generalized) (M62.81);Difficulty in walking, not elsewhere classified (R26.2);Other symptoms and signs  involving the nervous system (R29.898)    Time: 8798-8774 PT Time Calculation (min) (ACUTE ONLY): 24 min   Charges:   PT Evaluation $PT Eval Moderate Complexity: 1 Mod   PT General Charges $$ ACUTE PT VISIT: 1 Visit        Zeeva Courser H. Delores, PT, DPT, NCS 07/24/24, 2:37 PM [phone]

## 2024-07-24 NOTE — Evaluation (Signed)
 Clinical/Bedside Swallow Evaluation Patient Details  Name: Deborah Velazquez MRN: [id number] Date of Birth: 08-Dec-1945  Today's Date: 07/24/2024 Time: SLP Start Time (ACUTE ONLY): 1126 SLP Stop Time (ACUTE ONLY): 1146 SLP Time Calculation (min) (ACUTE ONLY): 20 min  Past Medical History:  Past Medical History:  Diagnosis Date   Anxiety    Arthritis    Hypercholesterolemia    Hyperlipemia    Irritable bowel syndrome    Parkinson disease (HCC)    Pneumonia due to human metapneumovirus 03/18/2023   Restless leg syndrome    RSD (reflex sympathetic dystrophy)    pt denies   Stroke [facility])    TIA's   UTI due to Klebsiella species 05/10/2023   Past Surgical History:  Past Surgical History:  Procedure Laterality Date   APPENDECTOMY     BACK SURGERY     BREAST BIOPSY Left 08/16/2013   neg   CATARACT EXTRACTION Left    CATARACT EXTRACTION W/PHACO Right 06/01/2020   Procedure: CATARACT EXTRACTION PHACO AND INTRAOCULAR LENS PLACEMENT (IOC) RIGHT;  Surgeon: Mittie Gaskin, MD;  Location: [facility] ORS;  Service: Ophthalmology;  Laterality: Right;  [id number] [id number] ap8.0   COLONOSCOPY     COLONOSCOPY WITH PROPOFOL  N/A 06/20/2015   Procedure: COLONOSCOPY WITH PROPOFOL ;  Surgeon: Gladis RAYMOND Mariner, MD;  Location: [facility] ENDOSCOPY;  Service: Endoscopy;  Laterality: N/A;   EYE SURGERY     Cataract in one eye   FRACTURE SURGERY     wrist - right and right leg. (? if any metal)   HIP ARTHROPLASTY Left 05/08/2023   Procedure: ARTHROPLASTY BIPOLAR HIP (HEMIARTHROPLASTY);  Surgeon: Lorelle Hussar, MD;  Location: [facility] ORS;  Service: Orthopedics;  Laterality: Left;   PARS PLANA VITRECTOMY Left 2010   HPI:  Per MD Progress Note, 78 y.o. female with medical history significant for anxiety, dyslipidemia, osteoarthritis, RLS, CVA, seizure disorder and Parkinson's disease, who presented to the emergency room with acute onset of generalized weakness and confusion more than her baseline with decreased  responsiveness.  She was admitted in June for Klebsiella UTI and bacteremia that was sensitive to ceftriaxone .  No reported fever or chills.  No reported nausea or vomiting or abdominal pain.  No reported chest pain or palpitations or cough or wheezing or dyspnea.  The patient was very somnolent and therefore not historian. CT Head: Stable head CT. No acute intracranial abnormality.  2. Generalized age-related cerebral atrophy with moderate chronic  microvascular ischemic disease. DG Chest: Low lung volumes with no active disease.    Assessment / Plan / Recommendation  Clinical Impression  Pt seen for bedside swallow evaluation in the setting of acute metabolic encephalopathy. MD/nursing reporting increased alertness this AM motivating SLP consult. At baseline, pt with Parkinson's disease and dementia. Family reports pt's ability to self feed approximately 50% of time. PTA, pt on a regular solids and thin liquids diet. Last bedside swallow evaluation completed on 05/09/24 with recommendation for mech soft and thin liquid.   Today, pt seen directly following PT session to facilitate upright, optimal positioning for safe PO intake. Pt alert, though endurance is significantly reduced. Generalized oral weakness, with inconsistent command following for complete oral motor exam. Adequate dentition. Of note, pt with interesting breath pattern, ?nasal emission, with gasp like quality. Family reporting pt indicated inability to catch breath for speech leading to onset of gasp/emission. Family denied presence at baseline. Emission occurring in isolation and with speech. Will continue to monitor.   Trials completed from lunch tray, consisting of  thin liquid, soft solids, and regular solids. Significant assist/support provided to facilitate self feeding (hand over hand to hold cup and utensil). No overt or subtle s/sx pharyngeal dysphagia noted. No change to vocal quality across trials.   Oral phase impacted by  generalized weakness, acute on chronic cognitive impairment, and limited endurance, leading to significantly prolonged mastication and clearance of more complex solids. Liquid wash utilized for oral clearance. Pt with resistance for cued visual inspection for oral clearance.   Given acute deconditioning, AMS, and limited alertness with impact of chronic, progressive diagnoses, pt at significant risk for aspiration. Education shared with family regarding concern for endurance for PO intake and diet recommendation. All reported understanding. Recommend dys 2 (chopped) and thin liquids with STRICT aspiration precautions, slow rate, small bites, elevated HOB, and alert for PO intake. Full supervision for PO intake to continually assess endurance/alertness for PO and provided assist for feeding. Encourage pt to hold cup to increase awareness/attention to task. Medications crushed in puree. Medical team updated regarding recommendations. SLP will continue to follow to determine safety with PO intake.    SLP Visit Diagnosis: Dysphagia, oropharyngeal phase (R13.12)    Aspiration Risk  Moderate aspiration risk    Diet Recommendation   Dysphagia 2 (chopped);Thin  Medication Administration: Crushed with puree    Other  Recommendations Oral Care Recommendations: Oral care before and after PO;Staff/trained caregiver to provide oral care     Assistance Recommended at Discharge  Assist with PO intake.   Functional Status Assessment Patient has had a recent decline in their functional status and/or demonstrates limited ability to make significant improvements in function in a reasonable and predictable amount of time (question prognosis in the setting of chronic, progressive nature of diagnosis)  Frequency and Duration min 2x/week  2 weeks       Prognosis Prognosis for improved oropharyngeal function: Fair Barriers to Reach Goals: Cognitive deficits;Time post onset;Severity of deficits      Swallow  Study   General Date of Onset: 07/24/24 HPI: Per MD Progress Note, 78 y.o. female with medical history significant for anxiety, dyslipidemia, osteoarthritis, RLS, CVA, seizure disorder and Parkinson's disease, who presented to the emergency room with acute onset of generalized weakness and confusion more than her baseline with decreased responsiveness.  She was admitted in June for Klebsiella UTI and bacteremia that was sensitive to ceftriaxone .  No reported fever or chills.  No reported nausea or vomiting or abdominal pain.  No reported chest pain or palpitations or cough or wheezing or dyspnea.  The patient was very somnolent and therefore not historian. CT Head: Stable head CT. No acute intracranial abnormality.  2. Generalized age-related cerebral atrophy with moderate chronic  microvascular ischemic disease. DG Chest: Low lung volumes with no active disease. Type of Study: Bedside Swallow Evaluation Previous Swallow Assessment: last BSE completed on 05/09/24 with recommendation for mech soft and thin liquids Diet Prior to this Study: Regular;Thin liquids (Level 0) Temperature Spikes Noted: No Respiratory Status: Room air History of Recent Intubation: No Behavior/Cognition: Alert;Distractible;Requires cueing Oral Cavity Assessment: Within Functional Limits Oral Care Completed by SLP: Recent completion by staff Oral Cavity - Dentition: Adequate natural dentition Vision: Functional for self-feeding Self-Feeding Abilities: Needs set up;Needs assist Patient Positioning: Upright in bed Baseline Vocal Quality: Low vocal intensity Volitional Cough: Cognitively unable to elicit Volitional Swallow: Unable to elicit    Oral/Motor/Sensory Function Overall Oral Motor/Sensory Function: Generalized oral weakness   Ice Chips Ice chips: Not tested   Thin Liquid  Thin Liquid: Within functional limits Presentation: Straw    Nectar Thick Nectar Thick Liquid: Not tested   Honey Thick Honey Thick Liquid:  Not tested   Puree Puree: Not tested   Solid     Solid: Impaired Presentation: Spoon Oral Phase Impairments: Poor awareness of bolus;Impaired mastication;Reduced lingual movement/coordination Oral Phase Functional Implications: Impaired mastication;Prolonged oral transit Pharyngeal Phase Impairments:  (none)     [place] Tadeo Besecker Clapp, MS, CCC-SLP Speech Language Pathologist Rehab Services; [facility] [place] [phone] (ascom)   [place] J Clapp 07/24/2024,1:25 PM

## 2024-07-24 NOTE — Progress Notes (Signed)
 Progress Note   Patient: Deborah Velazquez FMW:[id number] DOB: Dec 20, 1945 DOA: 07/19/2024     4 DOS: the patient was seen and examined on 07/24/2024   Brief hospital course: 78 y.o. female with medical history significant for anxiety, dyslipidemia, osteoarthritis, RLS, CVA, seizure disorder and Parkinson's disease, who presented to the emergency room with acute onset of generalized weakness and confusion more than her baseline with decreased responsiveness.  She was admitted in June for Klebsiella UTI and bacteremia that was sensitive to ceftriaxone .  No reported fever or chills.  No reported nausea or vomiting or abdominal pain.  No reported chest pain or palpitations or cough or wheezing or dyspnea.  The patient was very somnolent and therefore not historian.   ED Course: When she came to the ER, BP was 146/103 with otherwise normal vital signs.  Labs revealed hypokalemia of 3.3 and CO2 21 with a -60 in the liter close and CBC was unremarkable.  Respiratory panel came back negative.  UA was positive for UTI.  Urine culture was sent. EKG as reviewed by me : EKG showed sinus rhythm with a rate of 88 with first-degree AV block Imaging: Noncontrasted CT scan showed no acute intracranial normalities chest x-ray showed low lung volumes with no acute cardiopulmonary disease.   The patient was given 2 L bolus of IV lactated ringer  and chemical IV potassium chloride  in addition to IV Rocephin .  She will be admitted to a medical telemetry observation bed for further evaluation and management.  8/27.  Patient still lethargic.  Unable to take medications or eat. 8/28.  Patient lethargic.  Replacing magnesium  and potassium 8/29.  Patient still lethargic.  Replacing potassium.  Patient made a DO NOT RESUSCITATE. 8/30.  Patient awake and answering questions and able to hold her arms up off the bed.  Will get speech therapy evaluation.  Continue fluids and antibiotics today.  Assessment and Plan: * Acute  metabolic encephalopathy Today is the first day patient answering some questions.  Hopefully will be able to eat something today.  Continue antibiotics.  Repeat CT scan of the head negative.  Acute lower UTI Unfortunately initial urine culture growing multiple species.  Repeat urine analysis negative.  Rocephin  probably working since repeat urine analysis was negative.  Continue Rocephin  for now.  Hypomagnesemia Replaced  Hypokalemia Replace with IV fluid  Parkinson's disease (HCC) Sinemet  if able to take  Depression Zoloft  and Cymbalta   Diarrhea Stool studies negative  Peripheral neuropathy Holding Lyrica   Lewy body dementia with mood disturbance (HCC) Mental status better today than since admission.        Subjective: Patient able to answer some questions and hold her arms briefly up off the bed.  Admitted with altered mental status and urinary infection  Physical Exam: Vitals:   07/23/24 2003 07/24/24 0137 07/24/24 0445 07/24/24 0804  BP: (!) 150/81 (!) 157/98 (!) 151/92 (!) 149/82  Pulse: 73 75 77 74  Resp: 16 18 18 18   Temp: 98.8 F (37.1 C) 98.2 F (36.8 C) 98.2 F (36.8 C) 98.4 F (36.9 C)  TempSrc:  Oral    SpO2: 95% 95% 96% 98%  Weight:       Physical Exam HENT:     Head: Normocephalic.  Eyes:     General: Lids are normal.     Conjunctiva/sclera: Conjunctivae normal.  Cardiovascular:     Rate and Rhythm: Normal rate and regular rhythm.     Heart sounds: S1 normal and S2 normal. Murmur heard.  Systolic murmur is present with a grade of 2/6.  Pulmonary:     Breath sounds: No decreased breath sounds, wheezing, rhonchi or rales.  Abdominal:     Palpations: Abdomen is soft.     Tenderness: There is no abdominal tenderness.  Musculoskeletal:     Right lower leg: No swelling.     Left lower leg: No swelling.  Skin:    General: Skin is warm.     Findings: No rash.  Neurological:     Mental Status: She is lethargic.     Data Reviewed: Last  potassium 3.4 last creatinine 0.44 last hemoglobin 12.4.  Repeat CT scan negative Family Communication: Family at bedside  Disposition: Status is: Inpatient Remains inpatient appropriate because: Since today is the first day where she has been awake we will see if she is able to eat.  Just because she is more awake today does not mean that she will be able to keep up with her nutritional needs.  Planned Discharge Destination: Home    Time spent: 28 minutes  Author: Charlie Patterson, MD 07/24/2024 12:58 PM  For on call review [email or web address].

## 2024-07-25 DIAGNOSIS — Z789 Other specified health status: Secondary | ICD-10-CM | POA: Diagnosis not present

## 2024-07-25 DIAGNOSIS — Z66 Do not resuscitate: Secondary | ICD-10-CM | POA: Diagnosis not present

## 2024-07-25 DIAGNOSIS — E876 Hypokalemia: Secondary | ICD-10-CM | POA: Diagnosis not present

## 2024-07-25 DIAGNOSIS — N390 Urinary tract infection, site not specified: Secondary | ICD-10-CM | POA: Diagnosis not present

## 2024-07-25 DIAGNOSIS — N3000 Acute cystitis without hematuria: Secondary | ICD-10-CM | POA: Diagnosis not present

## 2024-07-25 DIAGNOSIS — G9341 Metabolic encephalopathy: Secondary | ICD-10-CM | POA: Diagnosis not present

## 2024-07-25 DIAGNOSIS — Z515 Encounter for palliative care: Secondary | ICD-10-CM | POA: Diagnosis not present

## 2024-07-25 LAB — BASIC METABOLIC PANEL WITH GFR
Anion gap: 9 (ref 5–15)
BUN: <5 mg/dL — ABNORMAL LOW (ref 8–23)
CO2: 24 mmol/L (ref 22–32)
Calcium: 8.7 mg/dL — ABNORMAL LOW (ref 8.9–10.3)
Chloride: 110 mmol/L (ref 98–111)
Creatinine, Ser: 0.65 mg/dL (ref 0.44–1.00)
GFR, Estimated: >60 mL/min (ref >60)
Glucose, Bld: 127 mg/dL — ABNORMAL HIGH (ref 70–99)
Potassium: 3.2 mmol/L — ABNORMAL LOW (ref 3.5–5.1)
Sodium: 143 mmol/L (ref 135–145)

## 2024-07-25 LAB — MAGNESIUM: Magnesium: 2.1 mg/dL (ref 1.7–2.4)

## 2024-07-25 LAB — GLUCOSE, CAPILLARY
Glucose-Capillary: 119 mg/dL — ABNORMAL HIGH (ref 70–99)
Glucose-Capillary: 140 mg/dL — ABNORMAL HIGH (ref 70–99)

## 2024-07-25 MED ORDER — POTASSIUM CHLORIDE 20 MEQ PO PACK
20.0000 meq | PACK | Freq: Once | ORAL | Status: AC
  Administered 2024-07-25: 20 meq via ORAL
  Filled 2024-07-25: qty 1

## 2024-07-25 MED ORDER — QUETIAPINE FUMARATE 25 MG PO TABS
25.0000 mg | ORAL_TABLET | Freq: Every day | ORAL | Status: DC
  Administered 2024-07-25 – 2024-07-26 (×2): 25 mg via ORAL
  Filled 2024-07-25 (×2): qty 1

## 2024-07-25 MED ORDER — CARBIDOPA-LEVODOPA 25-250 MG PO TABS
1.0000 | ORAL_TABLET | Freq: Four times a day (QID) | ORAL | Status: DC
  Administered 2024-07-25 – 2024-07-27 (×5): 1 via ORAL
  Filled 2024-07-25 (×9): qty 1

## 2024-07-25 MED ORDER — KCL IN DEXTROSE-NACL 20-5-0.9 MEQ/L-%-% IV SOLN
INTRAVENOUS | Status: DC
  Filled 2024-07-25 (×2): qty 1000

## 2024-07-25 NOTE — Progress Notes (Signed)
 Speech Language Pathology Treatment: Dysphagia  Patient Details Name: Deborah Velazquez MRN: [id number] DOB: [birth year] Today's Date: 07/25/2024 Time: 0940-0950 SLP Time Calculation (min) (ACUTE ONLY): 10 min  Assessment / Plan / Recommendation Clinical Impression  Pt seen for ongoing diet toleration. Pt's friend present and reports more normalized breathing patterns but decreased vocal intensity this morning. Pt is verbal throughout the session but speech intelligibility to reduced to ~ 60% at the sentence level d/t decreased volume and subjective increased level of confusion. Pt was free of overt s/s of aspiration when consuming yogurt and thin liquids via straw. Education provided on aspiration precautions especially in the event that more abnormal breathing patterns return. All questions were answered to satisfaction. ST will follow briefly for continued diet toleration.    HPI HPI: Per MD Progress Note, 78 y.o. female with medical history significant for anxiety, dyslipidemia, osteoarthritis, RLS, CVA, seizure disorder and Parkinson's disease, who presented to the emergency room with acute onset of generalized weakness and confusion more than her baseline with decreased responsiveness.  She was admitted in June for Klebsiella UTI and bacteremia that was sensitive to ceftriaxone .  No reported fever or chills.  No reported nausea or vomiting or abdominal pain.  No reported chest pain or palpitations or cough or wheezing or dyspnea.  The patient was very somnolent and therefore not historian. CT Head: Stable head CT. No acute intracranial abnormality.  2. Generalized age-related cerebral atrophy with moderate chronic  microvascular ischemic disease. DG Chest: Low lung volumes with no active disease.      SLP Plan  Continue with current plan of care          Recommendations  Diet recommendations: Dysphagia 2 (fine chop);Thin liquid Liquids provided via: Straw Medication Administration:  Crushed with puree Supervision: Full supervision/cueing for compensatory strategies;Staff to assist with self feeding;Trained caregiver to feed patient Compensations: Minimize environmental distractions;Slow rate;Small sips/bites Postural Changes and/or Swallow Maneuvers: Seated upright 90 degrees;Upright 30-60 min after meal;Out of bed for meals                  Oral care before and after PO;Staff/trained caregiver to provide oral care   Frequent or constant Supervision/Assistance Dysphagia, oropharyngeal phase (R13.12)     Continue with current plan of care    Berda Shelvin B. Rubbie, M.S., CCC-SLP, Tree surgeon Certified Brain Injury Specialist [facility]  [facility] Rehabilitation Services Office [phone] Ascom [phone] Fax [phone]

## 2024-07-25 NOTE — Progress Notes (Signed)
 Palliative Care Progress Note, Assessment & Plan   Patient Name: Deborah Velazquez       Date: 07/25/2024 DOB: [birth year]  Age: 78 y.o. MRN#: [id number] Attending Physician: Josette Ade, MD Primary Care Physician: Marylynn Verneita CROME, MD Admit Date: 07/19/2024  Subjective: Feels better today. She denies pain. Had Ensure and applesauce for breakfast. Slept well last night. Denies CP/SOB.  HPI: Per previous HPI: 78 y.o. female  with past medical history of anxiety, arthritis, hypercholesterolemia, hyperlipidemia, IBS, Parkinson disease, Lewy Body dementia, restless leg syndrome, TIA, and June 2024 hospitalization for UTI due to Klebsiella species admitted on 07/19/2024 with acute onset of generalized weakness and confusion outside of her baseline with decreased responsiveness.   Upon arrival to the ED, patient was hypertensive with hypokalemia.  IV fluids were given in addition to IV potassium chloride  and Rocephin .   UA was positive for UTI so urine culture was sent.   CT revealed no acute intracranial abnormalities.  Radiograph of chest revealed low lung volumes with no acute cardiopulmonary disease.   Patient is being treated for acute metabolic encephalopathy likely due to to lower UTI infection.   PMT was consulted to support patient and family care discussions.   8/29: Family agreed to DNR after meeting with Dr. Josette  Summary of counseling/coordination of care: Extensive chart review completed prior to meeting patient including labs, vital signs, imaging, progress notes, orders, and available advanced directive documents from current and previous encounters.   After reviewing the patient's chart and assessing the patient at bedside, I spoke with patient and husband in regards to symptom management  and goals of care.   Ill-appearing, elderly female resting in bed with husband at bedside. She participates minimally in conversation. She is alert to self but unable to correctly answer orientation questions. For location, she responds [employer], year 2002. She is not able to provide current president name.   Discussed with husband patient is much more interactive than before. Discussed with spouse the importance of eating.   Therapeutic silence and active listening provided for husband to share his thoughts and emotions regarding current medical situation.  Emotional support provided.  Physical Exam Vitals reviewed.  Constitutional:      General: She is not in acute distress.    Appearance: She is ill-appearing.  HENT:     Head: Normocephalic and atraumatic.     Mouth/Throat:     Mouth: Mucous membranes are dry.  Pulmonary:     Effort: Pulmonary effort is normal. No respiratory distress.  Musculoskeletal:     Right lower leg: No edema.     Left lower leg: No edema.  Skin:    General: Skin is warm and dry.     Coloration: Skin is pale.  Neurological:     Mental Status: She is alert. She is disoriented.   Recommendations/Plan: Continue DNR/DNI  Continue current supportive interventions PMT will continue to follow for needs                Total Time 50 minutes   Time spent includes: Detailed review of medical records (labs, imaging, vital signs), medically appropriate exam (mental status, respiratory, cardiac, skin), discussed with treatment team,  counseling and educating patient, family and staff, documenting clinical information, medication management and coordination of care.     Devere Sacks, AMANDA [facility] Palliative Medicine Team  07/25/2024 8:41 AM  Office [phone]  Pager [phone]

## 2024-07-25 NOTE — Progress Notes (Signed)
 Progress Note   Patient: Deborah Velazquez FMW:[id number] DOB: [birth year] DOA: 07/19/2024     5 DOS: the patient was seen and examined on 07/25/2024   Brief hospital course: 78 y.o. female with medical history significant for anxiety, dyslipidemia, osteoarthritis, RLS, CVA, seizure disorder and Parkinson's disease, who presented to the emergency room with acute onset of generalized weakness and confusion more than her baseline with decreased responsiveness.  She was admitted in June for Klebsiella UTI and bacteremia that was sensitive to ceftriaxone .  No reported fever or chills.  No reported nausea or vomiting or abdominal pain.  No reported chest pain or palpitations or cough or wheezing or dyspnea.  The patient was very somnolent and therefore not historian.   ED Course: When she came to the ER, BP was 146/103 with otherwise normal vital signs.  Labs revealed hypokalemia of 3.3 and CO2 21 with a -60 in the liter close and CBC was unremarkable.  Respiratory panel came back negative.  UA was positive for UTI.  Urine culture was sent. EKG as reviewed by me : EKG showed sinus rhythm with a rate of 88 with first-degree AV block Imaging: Noncontrasted CT scan showed no acute intracranial normalities chest x-ray showed low lung volumes with no acute cardiopulmonary disease.   The patient was given 2 L bolus of IV lactated ringer  and chemical IV potassium chloride  in addition to IV Rocephin .  She will be admitted to a medical telemetry observation bed for further evaluation and management.  8/27.  Patient still lethargic.  Unable to take medications or eat. 8/28.  Patient lethargic.  Replacing magnesium  and potassium 8/29.  Patient still lethargic.  Replacing potassium.  Patient made a DO NOT RESUSCITATE. 8/30.  Patient awake and answering questions and able to hold her arms up off the bed.  Will get speech therapy evaluation.  Continue fluids and antibiotics today.  Assessment and Plan: * Acute  metabolic encephalopathy Yesterday and today patient's mental status better than previous.  Patient having hallucinations seeing deer outside.  Having trouble swallowing the extended release Sinemet  but took her other medications crushed in applesauce.  Patient on dysphagia 2 diet.  Will see how she eats today.  Overall prognosis will be based on how much she can eat.  Acute lower UTI Unfortunately initial urine culture growing multiple species.  Repeat urine analysis negative.  Rocephin  probably working since repeat urine analysis was negative.  Completed Rocephin .  Hypomagnesemia Replaced  Hypokalemia Replace with IV fluid and oral K-Lor  Parkinson's disease (HCC) Sinemet  if able to take  Depression Zoloft  and Cymbalta   Diarrhea Stool studies negative  Peripheral neuropathy Holding Lyrica   Lewy body dementia with mood disturbance (HCC) Mental status better past 2 days than since admission.  Patient having hallucinations.  She does have this as an outpatient also.  Will restart Seroquel  at night        Subjective: Patient with some hallucinations and seeing some deer outside.  Patient having trouble swallowing her extended release Sinemet .  Multiple attempts.  Patient admitted with altered mental status and UTI.  Physical Exam: Vitals:   07/24/24 1627 07/24/24 1957 07/25/24 0318 07/25/24 0745  BP: 123/85 128/74 124/73 (!) 147/82  Pulse: 87 76 67 68  Resp: 19 16 18 16   Temp: 98.4 F (36.9 C) 99.7 F (37.6 C)  98.6 F (37 C)  TempSrc:  Axillary  Oral  SpO2: 98% 95% 92% 93%  Weight:       Physical Exam HENT:  Head: Normocephalic.  Eyes:     General: Lids are normal.     Conjunctiva/sclera: Conjunctivae normal.  Cardiovascular:     Rate and Rhythm: Normal rate and regular rhythm.     Heart sounds: S1 normal and S2 normal. Murmur heard.     Systolic murmur is present with a grade of 2/6.  Pulmonary:     Breath sounds: No decreased breath sounds, wheezing,  rhonchi or rales.  Abdominal:     Palpations: Abdomen is soft.     Tenderness: There is no abdominal tenderness.  Musculoskeletal:     Right lower leg: No swelling.     Left lower leg: No swelling.  Skin:    General: Skin is warm.     Findings: No rash.  Neurological:     Mental Status: She is alert.     Data Reviewed: Potassium 3.2, creatinine 0.65  Family Communication: Family at bedside  Disposition: Status is: Inpatient Remains inpatient appropriate because: See how she does eating today.  Planned Discharge Destination: Family would like to take home when ready for discharge    Time spent: 28 minutes  Author: Charlie Patterson, MD 07/25/2024 11:11 AM  For on call review [email or web address].

## 2024-07-25 NOTE — Progress Notes (Signed)
 Initial Nutrition Assessment  DOCUMENTATION CODES:   Not applicable  INTERVENTION:  Continue oral diet as recommended per SLP Currently on dysphagia 2, thin liquids Full supervision and feeding with assistance Magic cup TID with meals, each supplement provides 290 kcal and 9 grams of protein Continue Ensure Plus High Protein po BID, each supplement provides 350 kcal and 20 grams of protein.  NUTRITION DIAGNOSIS:   Inadequate oral intake related to lethargy/confusion, acute illness as evidenced by per patient/family report, meal completion < 25%.  GOAL:   Patient will meet greater than or equal to 90% of their needs  MONITOR:   PO intake, Supplement acceptance, Labs, Weight trends  REASON FOR ASSESSMENT:   Consult Assessment of nutrition requirement/status  ASSESSMENT:   Pt admitted with acute onset generalized weakness and confusion likely r/t UTI. PMH significant for anxiety, dyslipidemia, osteoarthritis, RLS, CVA, seizure disorder, dementia and Parkinson's disease.  RD working remotely. Chart reviewed for significant information as RD unable to reach pt via phone call to room.  Palliative care team following for ongoing GOC discussion.   Per review of chart, pt has remained too lethargic to take medications or eat since admission. Yesterday she was more awake and able to answer some questions therefore SLP was consulted.  SLP recommend dysphagia 2 diet with thin liquids. Provide full supervision and assist with feeding.   Unfortunately, there is limited weight history on file to review over the last year and since date of admission.  It appears over the last year, her weight has gradually declined however unable to assess significance d/t limited history.   Medications: D5 and NaCl with KCl @ 87ml/hr  Labs:   Potassium 3.2 BUN <5 CBG's 119-152 x24 hours  NUTRITION - FOCUSED PHYSICAL EXAM: RD working remotely. Deferred to follow up.   Diet Order:   Diet Order              DIET DYS 2 Room service appropriate? No; Fluid consistency: Thin  Diet effective now                   EDUCATION NEEDS:   No education needs have been identified at this time  Skin:  Skin Assessment: Reviewed RN Assessment  Last BM:  8/30 type 7 x2 (medium/large)  Height:   Ht Readings from Last 1 Encounters:  05/08/24 5' 5 (1.651 m)    Weight:   Wt Readings from Last 1 Encounters:  07/20/24 66 kg   BMI:  Body mass index is 24.21 kg/m.  Estimated Nutritional Needs:   Kcal:  1500-1700  Protein:  80-90g  Fluid:  >/=1.5L  Royce Maris, RDN, LDN Clinical Nutrition See [facility] for contact information.

## 2024-07-26 DIAGNOSIS — N390 Urinary tract infection, site not specified: Secondary | ICD-10-CM | POA: Diagnosis not present

## 2024-07-26 DIAGNOSIS — G9341 Metabolic encephalopathy: Secondary | ICD-10-CM | POA: Diagnosis not present

## 2024-07-26 DIAGNOSIS — Z515 Encounter for palliative care: Secondary | ICD-10-CM | POA: Diagnosis not present

## 2024-07-26 DIAGNOSIS — Z66 Do not resuscitate: Secondary | ICD-10-CM | POA: Diagnosis not present

## 2024-07-26 DIAGNOSIS — E876 Hypokalemia: Secondary | ICD-10-CM | POA: Diagnosis not present

## 2024-07-26 DIAGNOSIS — N3000 Acute cystitis without hematuria: Secondary | ICD-10-CM | POA: Diagnosis not present

## 2024-07-26 DIAGNOSIS — G20B2 Parkinson's disease with dyskinesia, with fluctuations: Secondary | ICD-10-CM | POA: Diagnosis not present

## 2024-07-26 LAB — BASIC METABOLIC PANEL WITH GFR
Anion gap: 10 (ref 5–15)
BUN: <5 mg/dL — ABNORMAL LOW (ref 8–23)
CO2: 26 mmol/L (ref 22–32)
Calcium: 9.2 mg/dL (ref 8.9–10.3)
Chloride: 106 mmol/L (ref 98–111)
Creatinine, Ser: 0.43 mg/dL — ABNORMAL LOW (ref 0.44–1.00)
GFR, Estimated: >60 mL/min (ref >60)
Glucose, Bld: 107 mg/dL — ABNORMAL HIGH (ref 70–99)
Potassium: 4.1 mmol/L (ref 3.5–5.1)
Sodium: 142 mmol/L (ref 135–145)

## 2024-07-26 LAB — GLUCOSE, CAPILLARY
Glucose-Capillary: 115 mg/dL — ABNORMAL HIGH (ref 70–99)
Glucose-Capillary: 118 mg/dL — ABNORMAL HIGH (ref 70–99)
Glucose-Capillary: 124 mg/dL — ABNORMAL HIGH (ref 70–99)
Glucose-Capillary: 129 mg/dL — ABNORMAL HIGH (ref 70–99)

## 2024-07-26 MED ORDER — ZINC OXIDE 40 % EX OINT
TOPICAL_OINTMENT | Freq: Two times a day (BID) | CUTANEOUS | Status: DC
  Filled 2024-07-26: qty 113

## 2024-07-26 NOTE — Progress Notes (Signed)
 SLP Cancellation Note  Patient Details Name: Deborah Velazquez MRN: [id number] DOB: Aug 26, 1946   Cancelled treatment:       Reason Eval/Treat Not Completed: Fatigue/lethargy limiting ability to participate;Patient not medically ready (chart reviewed; consulted NSG re: pt's status today)  Per NSG discussion, pt is less responsive today; more lethargic. She has not taken any po's. Per MD note, Family interested in Hospice evaluation, hoping to take pt home with Hospice tomorrow.   Discussed when to offer po's and when to Hold on po's and general aspiration precautions w/ NSG - based on pt's cues/presentation and a request for something only.  ST services will f/u w/ pt's status tomorrow. NSG agreed. Will Hold on dysphagia tx today per pt's lethargy and POC discussion re: Hospice d/c. NSG agreed.     Comer Portugal, MS, CCC-SLP Speech Language Pathologist Rehab Services; [facility] [phone] (ascom) Samie Reasons 07/26/2024, 2:33 PM

## 2024-07-26 NOTE — TOC Progression Note (Signed)
 Transition of Care [facility]) - Progression Note    Patient Details  Name: Deborah Velazquez MRN: [id number] Date of Birth: [birth year]  [facility]) CM/SW Contact  [place] L Latajah Thuman, [place] Phone Number: 07/26/2024, 10:05 AM  Clinical Narrative:     Secure chat received. Family is now agreeable to hospice services being discussed. CSW contacted the HCPOA, Ms. Wise, and she confirmed this information. She was agreeable to [facility] discussing services. She also advised that she would be moving forward with [facility] if appropriate.   [facility] notified of patient choice.                     Expected Discharge Plan and Services                                               Social Drivers of Health (SDOH) Interventions SDOH Screenings   Food Insecurity: No Food Insecurity (07/20/2024)  Housing: Low Risk  (07/20/2024)  Transportation Needs: No Transportation Needs (07/20/2024)  Utilities: Not At Risk (07/20/2024)  Alcohol Screen: Low Risk  (11/11/2023)  Depression (PHQ2-9): High Risk (11/11/2023)  Financial Resource Strain: Low Risk  (11/11/2023)  Physical Activity: Inactive (11/11/2023)  Social Connections: Unknown (07/20/2024)  Stress: No Stress Concern Present (11/11/2023)  Tobacco Use: Low Risk  (07/19/2024)  Health Literacy: Inadequate Health Literacy (11/11/2023)    Readmission Risk Interventions     No data to display

## 2024-07-26 NOTE — Progress Notes (Signed)
 [facility], [address] Liaison Note  Received request from [place],LCSW , Transitions of Care Manager, for hospice services at home after discharge.  Spoke with Linda Wise HCPOA, to initiate education related to hospice philosophy, services, and team approach to care.  Ms Delsie  verbalized understanding of information given.  Per discussion, the plan is for discharge home tomorrow by [employer] transport.    DME needs discussed.  Patient has the following equipment in the home: hospital bed, over bed table, WC, BSC, Shower chair, hoyer lift.    Patient/family requests the following equipment in the home: None  The address has been verified and is correct in the chart.  Please send signed and completed DNR home with the patient/family.  Please provide prescriptions at discharge as needed to ensure ongoing symptom management.   [facility] information and contact numbers given to Ms Delsie.   Above information shared with Transitions of Care Manager.     Please call with any Hospice related questions or concerns.  Thank you for the opportunity to participate in this patient's care.  Marinell Nova, [facility] Liaison [phone]

## 2024-07-26 NOTE — Plan of Care (Signed)
  Problem: Education: Goal: Knowledge of General Education information will improve Description: Including pain rating scale, medication(s)/side effects and non-pharmacologic comfort measures Outcome: Progressing   Problem: Clinical Measurements: Goal: Ability to maintain clinical measurements within normal limits will improve Outcome: Progressing   Problem: Clinical Measurements: Goal: Will remain free from infection Outcome: Progressing   Problem: Clinical Measurements: Goal: Diagnostic test results will improve Outcome: Progressing   Problem: Activity: Goal: Risk for activity intolerance will decrease Outcome: Progressing   Problem: Nutrition: Goal: Adequate nutrition will be maintained Outcome: Progressing   Problem: Safety: Goal: Ability to remain free from injury will improve Outcome: Progressing   Problem: Skin Integrity: Goal: Risk for impaired skin integrity will decrease Outcome: Progressing

## 2024-07-26 NOTE — Progress Notes (Signed)
 Progress Note   Patient: Deborah Velazquez FMW:[id number] DOB: [birth year] DOA: 07/19/2024     6 DOS: the patient was seen and examined on 07/26/2024   Brief hospital course: 78 y.o. female with medical history significant for anxiety, dyslipidemia, osteoarthritis, RLS, CVA, seizure disorder and Parkinson's disease, who presented to the emergency room with acute onset of generalized weakness and confusion more than her baseline with decreased responsiveness.  She was admitted in June for Klebsiella UTI and bacteremia that was sensitive to ceftriaxone .  No reported fever or chills.  No reported nausea or vomiting or abdominal pain.  No reported chest pain or palpitations or cough or wheezing or dyspnea.  The patient was very somnolent and therefore not historian.   ED Course: When she came to the ER, BP was 146/103 with otherwise normal vital signs.  Labs revealed hypokalemia of 3.3 and CO2 21 with a -60 in the liter close and CBC was unremarkable.  Respiratory panel came back negative.  UA was positive for UTI.  Urine culture was sent. EKG as reviewed by me : EKG showed sinus rhythm with a rate of 88 with first-degree AV block Imaging: Noncontrasted CT scan showed no acute intracranial normalities chest x-ray showed low lung volumes with no acute cardiopulmonary disease.   The patient was given 2 L bolus of IV lactated ringer  and chemical IV potassium chloride  in addition to IV Rocephin .  She will be admitted to a medical telemetry observation bed for further evaluation and management.  8/27.  Patient still lethargic.  Unable to take medications or eat. 8/28.  Patient lethargic.  Replacing magnesium  and potassium 8/29.  Patient still lethargic.  Replacing potassium.  Patient made a DO NOT RESUSCITATE. 8/30.  Patient awake and answering questions and able to hold her arms up off the bed.  Will get speech therapy evaluation.  Continue fluids and antibiotics today. 9/1.  Family interested in hospice  evaluation.  Less responsive today.  Went to bed at 3 AM.  Assessment and Plan: * Acute metabolic encephalopathy Patient unresponsive upon coming into the hospital.  Had 2 days of better responsiveness and answering some questions and able to eat a little bit.  Today less responsive again.  Family interested in hospice conversation.  They would like to take home with hospice tomorrow.  Patient is a DNR.  Acute lower UTI Unfortunately initial urine culture growing multiple species.  Repeat urine analysis negative.  Rocephin  probably worked since repeat urine analysis was negative.  Completed Rocephin .  Hypomagnesemia Replaced  Hypokalemia Replaced  Parkinson's disease (HCC) Sinemet  if able to take.  Changed to short acting because the short acting is able to be crushed in the long-acting is not.  She was pocketing her pills yesterday.  Depression Zoloft  and Cymbalta  if able to take  Diarrhea Stool studies negative  Peripheral neuropathy Holding Lyrica   Lewy body dementia with mood disturbance (HCC) Mental status impaired again today.  Was good for the last 2 days but likely unable to keep up with nutritional needs.        Subjective: Only able to get patient to open up her eyes today.  She moved her arms on her own.  Admitted with altered mental status history of dementia  Physical Exam: Vitals:   07/25/24 1442 07/25/24 2016 07/26/24 0551 07/26/24 0818  BP: (!) 153/74 (!) 154/81 (!) 145/86 (!) 121/53  Pulse: 70 64 66 65  Resp: 16 16 16 18   Temp: 98.7 F (37.1 C) 98  F (36.7 C) 99.2 F (37.3 C) 98.8 F (37.1 C)  TempSrc: Oral Axillary Axillary   SpO2: 100% 94% 94% 94%  Weight:       Physical Exam HENT:     Head: Normocephalic.  Eyes:     General: Lids are normal.     Conjunctiva/sclera: Conjunctivae normal.  Cardiovascular:     Rate and Rhythm: Normal rate and regular rhythm.     Heart sounds: S1 normal and S2 normal. Murmur heard.     Systolic murmur is  present with a grade of 2/6.  Pulmonary:     Breath sounds: No decreased breath sounds, wheezing, rhonchi or rales.  Abdominal:     Palpations: Abdomen is soft.     Tenderness: There is no abdominal tenderness.  Musculoskeletal:     Right lower leg: No swelling.     Left lower leg: No swelling.  Skin:    General: Skin is warm.     Findings: No rash.  Neurological:     Mental Status: She is lethargic.     Data Reviewed: No new data  Family Communication: Family at bedside  Disposition: Status is: Inpatient Remains inpatient appropriate because: Family open for hospice evaluation since less responsive today.  Family okay stopping IV fluids.  Planned Discharge Destination: Potentially home with hospice tomorrow    Time spent: 27 minutes  Author: Charlie Patterson, MD 07/26/2024 10:51 AM  For on call review [email or web address].

## 2024-07-26 NOTE — Progress Notes (Signed)
 Palliative Care Progress Note, Assessment & Plan   Patient Name: Deborah Velazquez       Date: 07/26/2024 DOB: 21-Apr-1946  Age: 78 y.o. MRN#: [id number] Attending Physician: Josette Ade, MD Primary Care Physician: Marylynn Verneita CROME, MD Admit Date: 07/19/2024  Subjective: Unable to assess  HPI: Per previous HPI: 78 y.o. female  with past medical history of anxiety, arthritis, hypercholesterolemia, hyperlipidemia, IBS, Parkinson disease, Lewy Body dementia, restless leg syndrome, TIA, and June 2024 hospitalization for UTI due to Klebsiella species admitted on 07/19/2024 with acute onset of generalized weakness and confusion outside of her baseline with decreased responsiveness.   Upon arrival to the ED, patient was hypertensive with hypokalemia.  IV fluids were given in addition to IV potassium chloride  and Rocephin .   UA was positive for UTI so urine culture was sent.   CT revealed no acute intracranial abnormalities.  Radiograph of chest revealed low lung volumes with no acute cardiopulmonary disease.   Patient is being treated for acute metabolic encephalopathy likely due to to lower UTI infection.   PMT was consulted to support patient and family care discussions.   8/29: Family agreed to DNR after meeting with Dr. Josette  Summary of counseling/coordination of care: Extensive chart review completed prior to meeting patient including labs, vital signs, imaging, progress notes, orders, and available advanced directive documents from current and previous encounters.   After reviewing the patient's chart and assessing the patient at bedside, I spoke with patient caregiver in regards to symptom management and goals of care.   Ill-appearing, elderly female resting in bed. She does not awaken to verbal or  tactile stimuli. Patient slept through phlebotomy blood draw at bedside. Respirations even and unlabored. She is in no distress.   [name], friend and caregiver at bedside shares that being awake for a couple of days and then sleeping deep for days is her normal over the past several years. [name] reports most recently, her sleeping throughout the day has increased and PO intake decreased. [name] advises that Dr. Josette has consulted [facility] liaison to speak with them around 2pm today as they have made the decision to go home with hospice. [name] adds that she does not want to keep bringing patient back to the hospital. The goal at this time is for her to comfortable in her home.    Therapeutic silence and active listening provided for caregiver to share their thoughts and emotions regarding current medical situation.  Emotional support provided.  Physical Exam Vitals reviewed.  Constitutional:      General: She is not in acute distress.    Appearance: She is ill-appearing.  HENT:     Head: Normocephalic and atraumatic.  Pulmonary:     Effort: Pulmonary effort is normal. No respiratory distress.  Musculoskeletal:     Right lower leg: No edema.     Left lower leg: No edema.  Skin:    General: Skin is warm and dry.     Recommendations/Plan: Continue DNR/DNI  Continue current supportive interventions Home with hospice 9/2            Total Time 50 minutes   Time spent includes: Detailed review of medical records (labs, imaging,  vital signs), medically appropriate exam (mental status, respiratory, cardiac, skin), discussed with treatment team, counseling and educating patient, family and staff, documenting clinical information, medication management and coordination of care.     Devere Sacks, [facility] Palliative Medicine Team  07/26/2024 9:44 AM  Office [phone]  Pager [phone]

## 2024-07-27 DIAGNOSIS — N390 Urinary tract infection, site not specified: Secondary | ICD-10-CM | POA: Diagnosis not present

## 2024-07-27 DIAGNOSIS — Z515 Encounter for palliative care: Secondary | ICD-10-CM | POA: Diagnosis not present

## 2024-07-27 DIAGNOSIS — G9341 Metabolic encephalopathy: Secondary | ICD-10-CM | POA: Diagnosis not present

## 2024-07-27 LAB — MAGNESIUM: Magnesium: 2.4 mg/dL (ref 1.7–2.4)

## 2024-07-27 MED ORDER — CARBIDOPA-LEVODOPA 25-250 MG PO TABS: 1.0000 | ORAL_TABLET | Freq: Four times a day (QID) | ORAL | 0 refills | Status: AC

## 2024-07-27 MED ORDER — MORPHINE SULFATE (CONCENTRATE) 20 MG/ML PO SOLN: 5.0000 mg | ORAL | 0 refills | Status: AC | PRN

## 2024-07-27 MED ORDER — ACETAMINOPHEN 325 MG PO TABS: 650.0000 mg | ORAL_TABLET | Freq: Four times a day (QID) | ORAL | Status: AC | PRN

## 2024-07-27 MED ORDER — QUETIAPINE FUMARATE 25 MG PO TABS: 25.0000 mg | ORAL_TABLET | Freq: Every day | ORAL | Status: AC

## 2024-07-27 MED ORDER — ZINC OXIDE 40 % EX OINT: TOPICAL_OINTMENT | Freq: Two times a day (BID) | CUTANEOUS | 0 refills | Status: AC

## 2024-07-27 NOTE — Discharge Summary (Signed)
 Physician Discharge Summary   Patient: Deborah Velazquez MRN: [id number] DOB: 1946-09-30  Admit date:     07/19/2024  Discharge date: 07/27/24  Discharge Physician: Charlie Patterson   PCP: Marylynn Verneita CROME, MD   Recommendations at discharge:   Discharge home with hospice Follow-up PCP 5 days (virtual visit if possible)  Discharge Diagnoses: Principal Problem:   End of life care Active Problems:   Acute metabolic encephalopathy   Acute lower UTI   Hypomagnesemia   Hypokalemia   Parkinson's disease (HCC)   Depression   Lewy body dementia with mood disturbance (HCC)   Peripheral neuropathy   Diarrhea    Hospital Course: 78 y.o. female with medical history significant for anxiety, dyslipidemia, osteoarthritis, RLS, CVA, seizure disorder and Parkinson's disease, who presented to the emergency room with acute onset of generalized weakness and confusion more than her baseline with decreased responsiveness.  She was admitted in June for Klebsiella UTI and bacteremia that was sensitive to ceftriaxone .  No reported fever or chills.  No reported nausea or vomiting or abdominal pain.  No reported chest pain or palpitations or cough or wheezing or dyspnea.  The patient was very somnolent and therefore not historian.   ED Course: When she came to the ER, BP was 146/103 with otherwise normal vital signs.  Labs revealed hypokalemia of 3.3 and CO2 21 with a -60 in the liter close and CBC was unremarkable.  Respiratory panel came back negative.  UA was positive for UTI.  Urine culture was sent. EKG as reviewed by me : EKG showed sinus rhythm with a rate of 88 with first-degree AV block Imaging: Noncontrasted CT scan showed no acute intracranial normalities chest x-ray showed low lung volumes with no acute cardiopulmonary disease.   The patient was given 2 L bolus of IV lactated ringer  and chemical IV potassium chloride  in addition to IV Rocephin .  She will be admitted to a medical telemetry  observation bed for further evaluation and management.  8/27.  Patient still lethargic.  Unable to take medications or eat. 8/28.  Patient lethargic.  Replacing magnesium  and potassium 8/29.  Patient still lethargic.  Replacing potassium.  Patient made a DO NOT RESUSCITATE. 8/30.  Patient awake and answering questions and able to hold her arms up off the bed.  Will get speech therapy evaluation.  Continue fluids and antibiotics today. 9/1.  Family interested in hospice evaluation.  Less responsive today.  Went to bed at 3 AM. 9/2.  Patient discharged home with hospice.  Roxanol if only gasping for breath.  Unclear how much she will eat and how much of her medication she will take.  Dysphagia 2 diet.  Assessment and Plan: * End of life care Patient will discharge home with hospice care today.  Unclear on how much she will be able to keep up with her nutritional needs.  Mental status was very impaired upon coming into the hospital.  We had a better mental status for 2 days but the last 2 days not as good.  Patient is a DO NOT RESUSCITATE.  Overall prognosis poor.  Patient will go home with hospice care.  Roxanol as needed for pain or gasping for breath.  Acute metabolic encephalopathy Patient unresponsive upon coming into the hospital.  Had 2 days of better responsiveness and answering some questions and able to eat a little bit.  Yesterday and this morning little less responsive.  Family interested in hospice at her home.  Patient is a DNR.  Unlikely to keep up with her nutritional needs.  Acute lower UTI Unfortunately initial urine culture growing multiple species.  Repeat urine analysis negative.  Rocephin  probably worked since repeat urine analysis was negative.  Completed Rocephin .  Hypomagnesemia Replaced  Hypokalemia Replaced  Parkinson's disease (HCC) Sinemet  if able to take.  Changed to short acting because the short acting is able to be crushed in the long-acting is not.  She was  pocketing her long-acting Sinemet  the other day.  Depression Zoloft  and Cymbalta  if able to take  Diarrhea Stool studies negative  Peripheral neuropathy Holding Lyrica   Lewy body dementia with mood disturbance (HCC) Mental status impaired again today.  Was good for 2 days but likely unable to keep up with nutritional needs.  Overall prognosis poor.  Patient is a DO NOT RESUSCITATE.  Setting up with home hospice at home.         Consultants: Palliative care, hospice liaison Procedures performed: None Disposition: Home with hospice care Diet recommendation:  Dysphagia 2 diet with thin liquids as tolerated DISCHARGE MEDICATION: Allergies as of 07/27/2024       Reactions   Pseudoephedrine Hcl Anxiety, Other (See Comments)   Nervous   Sudafed [pseudoephedrine] Anxiety, Other (See Comments)   Nervous        Medication List     STOP taking these medications    atorvastatin  10 MG tablet Commonly known as: LIPITOR   carbidopa -levodopa  50-200 MG tablet Commonly known as: SINEMET  CR Replaced by: carbidopa -levodopa  25-250 MG tablet   Multi-Vitamin tablet   pregabalin  150 MG capsule Commonly known as: LYRICA        TAKE these medications    acetaminophen  325 MG tablet Commonly known as: TYLENOL  Take 2 tablets (650 mg total) by mouth every 6 (six) hours as needed for mild pain (pain score 1-3) or fever (or Fever >/= 101).   aspirin  EC 81 MG tablet Take 81 mg by mouth daily. Swallow whole.   carbidopa -levodopa  25-250 MG tablet Commonly known as: SINEMET  IR Take 1 tablet by mouth 4 (four) times daily. Replaces: carbidopa -levodopa  50-200 MG tablet   DULoxetine  60 MG capsule Commonly known as: CYMBALTA  Take 60 mg by mouth daily.   feeding supplement Liqd Take 237 mLs by mouth 2 (two) times daily between meals.   lamoTRIgine  100 MG tablet Commonly known as: LAMICTAL  Take 1 tablet by mouth 2 (two) times daily.   liver oil-zinc  oxide 40 % ointment Commonly  known as: DESITIN Apply topically 2 (two) times daily.   morphine  20 MG/ML concentrated solution Commonly known as: ROXANOL Take 0.25 mLs (5 mg total) by mouth every 2 (two) hours as needed for severe pain (pain score 7-10) or shortness of breath (gasping for breath).   OXYGEN  Inhale 2 L/min into the lungs at bedtime.   QUEtiapine  25 MG tablet Commonly known as: SEROQUEL  Take 1 tablet (25 mg total) by mouth at bedtime. What changed: how much to take   sertraline  100 MG tablet Commonly known as: ZOLOFT  Take 200 mg by mouth daily.        Follow-up Information     Marylynn Verneita CROME, MD. Go in 5 day(s).   Specialty: Internal Medicine Why: virtual visit if possible  appt. on 08/06/24 @5 :00pm, video conference will be available for login at 4:45pm Contact information: [address] [place] [address] [phone]                Discharge Exam: Fredricka Weights   07/20/24 0004  Weight: 66 kg   Physical Exam HENT:     Head: Normocephalic.  Eyes:     General: Lids are normal.     Conjunctiva/sclera: Conjunctivae normal.  Cardiovascular:     Rate and Rhythm: Normal rate and regular rhythm.     Heart sounds: S1 normal and S2 normal. Murmur heard.     Systolic murmur is present with a grade of 2/6.  Pulmonary:     Breath sounds: No decreased breath sounds, wheezing, rhonchi or rales.  Abdominal:     Palpations: Abdomen is soft.     Tenderness: There is no abdominal tenderness.  Musculoskeletal:     Right lower leg: No swelling.     Left lower leg: No swelling.  Skin:    General: Skin is warm.     Findings: No rash.  Neurological:     Mental Status: She is lethargic.     Comments: Today patient open dry eyes but did not speak with me.  Able to move her arms on her own.      Condition at discharge: fair  The results of significant diagnostics from this hospitalization (including imaging, microbiology, ancillary and laboratory) are listed  below for reference.   Imaging Studies: CT HEAD WO CONTRAST ( ) Result Date: 07/23/2024 CLINICAL DATA:  Initial evaluation for acute mental status change, unknown cause. EXAM: CT HEAD WITHOUT CONTRAST TECHNIQUE: Contiguous axial images were obtained from the base of the skull through the vertex without intravenous contrast. RADIATION DOSE REDUCTION: This exam was performed according to the departmental dose-optimization program which includes automated exposure control, adjustment of the mA and/or kV according to patient size and/or use of iterative reconstruction technique. COMPARISON:  CT from 07/19/2024 FINDINGS: Brain: Generalized age-related cerebral atrophy with moderate chronic microvascular ischemic disease, stable. No acute intracranial hemorrhage. No acute large vessel territory infarct. No mass lesion, midline shift or mass effect. Ventricular prominence related global parenchymal volume loss without hydrocephalus, unchanged. No extra-axial fluid collection. Vascular: No abnormal hyperdense vessel. Scattered vascular calcifications noted within the carotid siphons. Skull: Scalp soft tissues within normal limits.  Calvarium intact. Sinuses/Orbits: Globes orbital soft tissues within normal limits. Visualized paranasal sinuses are clear. No significant mastoid effusion. Other: None. IMPRESSION: 1. Stable head CT. No acute intracranial abnormality. 2. Generalized age-related cerebral atrophy with moderate chronic microvascular ischemic disease. Electronically Signed   By: Morene Hoard M.D.   On: 07/23/2024 19:54   CT HEAD WO CONTRAST ( ) Result Date: 07/19/2024 CLINICAL DATA:  poor responsiveness superimposed on dementia and parkinson's, eval ich, cva EXAM: CT HEAD WITHOUT CONTRAST TECHNIQUE: Contiguous axial images were obtained from the base of the skull through the vertex without intravenous contrast. RADIATION DOSE REDUCTION: This exam was performed according to the departmental  dose-optimization program which includes automated exposure control, adjustment of the mA and/or kV according to patient size and/or use of iterative reconstruction technique. COMPARISON:  CT head 05/08/2024 FINDINGS: Brain: Cerebral ventricle sizes are concordant with the degree of cerebral volume loss-stable. Patchy and confluent areas of decreased attenuation are noted throughout the deep and periventricular white matter of the cerebral hemispheres bilaterally, compatible with chronic microvascular ischemic disease. No evidence of large-territorial acute infarction. No parenchymal hemorrhage. No mass lesion. No extra-axial collection. No mass effect or midline shift. No hydrocephalus. Basilar cisterns are patent. Vascular: No hyperdense vessel. Atherosclerotic calcifications are present within the cavernous internal carotid arteries. Skull: No acute fracture or focal lesion. Sinuses/Orbits: Paranasal sinuses and mastoid air cells are clear.  Bilateral lens replacement. Otherwise the orbits are unremarkable. Other: None. IMPRESSION: No acute intracranial abnormality. Electronically Signed   By: Morgane  Naveau M.D.   On: 07/19/2024 19:25   DG Chest Portable 1 View Result Date: 07/19/2024 CLINICAL DATA:  poorly responsive, eval infiltrate EXAM: PORTABLE CHEST 1 VIEW COMPARISON:  Chest x-ray 05/07/2024 FINDINGS: Patient is rotated. The heart and mediastinal contours are unchanged. Limited evaluation of the right apex due to overlying mandible. Low lung volumes. No focal consolidation. No pulmonary edema. No pleural effusion. No pneumothorax. No acute osseous abnormality. IMPRESSION: 1. Low lung volumes with no active disease. 2. Limited evaluation of the right apex due to overlying mandible. 3. Electronically Signed   By: Morgane  Naveau M.D.   On: 07/19/2024 19:24    Microbiology: Results for orders placed or performed during the hospital encounter of 07/19/24  Resp panel by RT-PCR (RSV, Flu A&B, Covid)  Anterior Nasal Swab     Status: None   Collection Time: 07/19/24  8:05 PM   Specimen: Anterior Nasal Swab  Result Value Ref Range Status   SARS Coronavirus 2 by RT PCR NEGATIVE NEGATIVE Final    Comment: (NOTE) SARS-CoV-2 target nucleic acids are NOT DETECTED.  The SARS-CoV-2 RNA is generally detectable in upper respiratory specimens during the acute phase of infection. The lowest concentration of SARS-CoV-2 viral copies this assay can detect is 138 copies/mL. A negative result does not preclude SARS-Cov-2 infection and should not be used as the sole basis for treatment or other patient management decisions. A negative result may occur with  improper specimen collection/handling, submission of specimen other than nasopharyngeal swab, presence of viral mutation(s) within the areas targeted by this assay, and inadequate number of viral copies(<138 copies/mL). A negative result must be combined with clinical observations, patient history, and epidemiological information. The expected result is Negative.  Fact Sheet for Patients:  BloggerCourse.com  Fact Sheet for Healthcare Providers:  SeriousBroker.it  This test is no t yet approved or cleared by the [place]  FDA and  has been authorized for detection and/or diagnosis of SARS-CoV-2 by FDA under an Emergency Use Authorization (EUA). This EUA will remain  in effect (meaning this test can be used) for the duration of the COVID-19 declaration under Section 564(b)(1) of the Act, 21 U.S.C.section 360bbb-3(b)(1), unless the authorization is terminated  or revoked sooner.       Influenza A by PCR NEGATIVE NEGATIVE Final   Influenza B by PCR NEGATIVE NEGATIVE Final    Comment: (NOTE) The Xpert Xpress SARS-CoV-2/FLU/RSV plus assay is intended as an aid in the diagnosis of influenza from Nasopharyngeal swab specimens and should not be used as a sole basis for treatment. Nasal washings  and aspirates are unacceptable for Xpert Xpress SARS-CoV-2/FLU/RSV testing.  Fact Sheet for Patients: BloggerCourse.com  Fact Sheet for Healthcare Providers: SeriousBroker.it  This test is not yet approved or cleared by the [place]  FDA and has been authorized for detection and/or diagnosis of SARS-CoV-2 by FDA under an Emergency Use Authorization (EUA). This EUA will remain in effect (meaning this test can be used) for the duration of the COVID-19 declaration under Section 564(b)(1) of the Act, 21 U.S.C. section 360bbb-3(b)(1), unless the authorization is terminated or revoked.     Resp Syncytial Virus by PCR NEGATIVE NEGATIVE Final    Comment: (NOTE) Fact Sheet for Patients: BloggerCourse.com  Fact Sheet for Healthcare Providers: SeriousBroker.it  This test is not yet approved or cleared by the [place]  FDA and has  been authorized for detection and/or diagnosis of SARS-CoV-2 by FDA under an Emergency Use Authorization (EUA). This EUA will remain in effect (meaning this test can be used) for the duration of the COVID-19 declaration under Section 564(b)(1) of the Act, 21 U.S.C. section 360bbb-3(b)(1), unless the authorization is terminated or revoked.  Performed at [facility], [address], [place], [place] [address]   Urine Culture     Status: Abnormal   Collection Time: 07/19/24 11:24 PM   Specimen: Urine, Random  Result Value Ref Range Status   Specimen Description   Final    URINE, RANDOM Performed at [facility], [address] [place], [place] [address]    Special Requests   Final    NONE Performed at [facility], [address] [place], [place] [address]    Culture MULTIPLE SPECIES PRESENT, SUGGEST RECOLLECTION (A)  Final   Report Status 07/21/2024 FINAL  Final  C Difficile Quick Screen w PCR reflex     Status:  None   Collection Time: 07/21/24  9:15 AM   Specimen: Stool  Result Value Ref Range Status   C Diff antigen NEGATIVE NEGATIVE Final   C Diff toxin NEGATIVE NEGATIVE Final   C Diff interpretation No C. difficile detected.  Final    Comment: NEGATIVE Performed at [facility], [address] [place], [place] [address]   Gastrointestinal Panel by PCR , Stool     Status: None   Collection Time: 07/21/24  9:15 AM   Specimen: Stool  Result Value Ref Range Status   Campylobacter species NOT DETECTED NOT DETECTED Final   Plesimonas shigelloides NOT DETECTED NOT DETECTED Final   Salmonella species NOT DETECTED NOT DETECTED Final   Yersinia enterocolitica NOT DETECTED NOT DETECTED Final   Vibrio species NOT DETECTED NOT DETECTED Final   Vibrio cholerae NOT DETECTED NOT DETECTED Final   Enteroaggregative E coli (EAEC) NOT DETECTED NOT DETECTED Final   Enteropathogenic E coli (EPEC) NOT DETECTED NOT DETECTED Final   Enterotoxigenic E coli (ETEC) NOT DETECTED NOT DETECTED Final   Shiga like toxin producing E coli (STEC) NOT DETECTED NOT DETECTED Final   Shigella/Enteroinvasive E coli (EIEC) NOT DETECTED NOT DETECTED Final   Cryptosporidium NOT DETECTED NOT DETECTED Final   Cyclospora cayetanensis NOT DETECTED NOT DETECTED Final   Entamoeba histolytica NOT DETECTED NOT DETECTED Final   Giardia lamblia NOT DETECTED NOT DETECTED Final   Adenovirus F40/41 NOT DETECTED NOT DETECTED Final   Astrovirus NOT DETECTED NOT DETECTED Final   Norovirus GI/GII NOT DETECTED NOT DETECTED Final   Rotavirus A NOT DETECTED NOT DETECTED Final   Sapovirus (I, II, IV, and V) NOT DETECTED NOT DETECTED Final    Comment: Performed at [facility], [address] [place], [place] [address]    Labs: CBC: Recent Labs  Lab 07/23/24 0504  WBC 7.4  HGB 12.4  HCT 38.4  MCV 84.2  PLT 310   Basic Metabolic Panel: Recent Labs  Lab 07/22/24 0645 07/23/24 0504 07/25/24 0728 07/26/24 1107  07/27/24 0353  NA 141 139 143 142  --   K 3.3* 3.4* 3.2* 4.1  --   CL 111 108 110 106  --   CO2 19* 20* 24 26  --   GLUCOSE 95 126* 127* 107*  --   BUN 5* <5* <5* <5*  --   CREATININE 0.64 0.44 0.65 0.43*  --   CALCIUM  9.1 8.8* 8.7* 9.2  --  MG 1.8 2.2 2.1  --  2.4  PHOS 2.9  --   --   --   --    Liver Function Tests: No results for input(s): AST, ALT, ALKPHOS, BILITOT, PROT, ALBUMIN  in the last 168 hours. CBG: Recent Labs  Lab 07/24/24 1959 07/24/24 2328 07/25/24 0316 07/25/24 0741 07/25/24 1142  GLUCAP 129*  129 119* 119* 118* 140*    Discharge time spent: greater than 30 minutes. Case discussed with nursing staff and transitional care team Signed: Charlie Patterson, MD [facility] 07/27/2024

## 2024-07-27 NOTE — TOC Transition Note (Signed)
 Transition of Care [facility]) - Discharge Note   Patient Details  Name: DANNAH RYLES MRN: [id number] Date of Birth: Nov 04, 1946  [facility] CM/SW Contact:  Corean ONEIDA Haddock, RN Phone Number: 07/27/2024, 9:50 AM   Clinical Narrative:    Patient to discharge today with hospice [place] with [facility] aware [employer] [place] [employer], she states that friend rosaline is in the room and will follow EMS home, and caregivers will be available when she arrives home  [facility] transport called          Patient Goals and CMS Choice            Discharge Placement                       Discharge Plan and Services Additional resources added to the After Visit Summary for                                       Social Drivers of Health (SDOH) Interventions SDOH Screenings   Food Insecurity: No Food Insecurity (07/20/2024)  Housing: Low Risk  (07/20/2024)  Transportation Needs: No Transportation Needs (07/20/2024)  Utilities: Not At Risk (07/20/2024)  Alcohol Screen: Low Risk  (11/11/2023)  Depression (PHQ2-9): High Risk (11/11/2023)  Financial Resource Strain: Low Risk  (11/11/2023)  Physical Activity: Inactive (11/11/2023)  Social Connections: Unknown (07/20/2024)  Stress: No Stress Concern Present (11/11/2023)  Tobacco Use: Low Risk  (07/19/2024)  Health Literacy: Inadequate Health Literacy (11/11/2023)     Readmission Risk Interventions     No data to display

## 2024-07-27 NOTE — Plan of Care (Signed)
  Problem: Health Behavior/Discharge Planning: Goal: Ability to manage health-related needs will improve Outcome: Not Progressing   Problem: Clinical Measurements: Goal: Ability to maintain clinical measurements within normal limits will improve Outcome: Not Progressing   

## 2024-07-27 NOTE — Assessment & Plan Note (Signed)
 Patient will discharge home with hospice care today.  Unclear on how much she will be able to keep up with her nutritional needs.  Mental status was very impaired upon coming into the hospital.  We had a better mental status for 2 days but the last 2 days not as good.  Patient is a DO NOT RESUSCITATE.  Overall prognosis poor.  Patient will go home with hospice care.  Roxanol as needed for pain or gasping for breath.

## 2024-07-28 ENCOUNTER — Telehealth: Payer: Self-pay | Admitting: *Deleted

## 2024-07-28 ENCOUNTER — Ambulatory Visit: Admitting: Urology

## 2024-07-28 NOTE — Transitions of Care (Post Inpatient/ED Visit) (Signed)
 07/28/2024  Name: Deborah Velazquez MRN: [id number] DOB: 1946-04-10  Today's TOC FU Call Status: Today's TOC FU Call Status:: Successful TOC FU Call Completed TOC FU Call Complete Date: 07/28/24 Patient's Name and Date of Birth confirmed.  Transition Care Management Follow-up Telephone Call Date of Discharge: 07/27/24 Discharge Facility: [facility]) Type of Discharge: Inpatient Admission Primary Inpatient Discharge Diagnosis:: acute metaboloic encephalopathy How have you been since you were released from the hospital?: Same Any questions or concerns?: Yes Patient Questions/Concerns:: What do we do if we decline hospice and she dies. Patient Questions/Concerns Addressed: Other: (RN discussed all options that are available.)  Items Reviewed: Did you receive and understand the discharge instructions provided?: Yes Medications obtained,verified, and reconciled?: Yes (Medications Reviewed) Any new allergies since your discharge?: No Dietary orders reviewed?: No Do you have support at home?: Yes People in Home [RPT]: spouse Name of Support/Comfort Primary Source: round the clock care givers. Christopher spouse/ Linda POA  Medications Reviewed Today: Medications Reviewed Today     Reviewed by Kennieth Cathlean DEL, RN (Case Manager) on 07/28/24 at 1155  Med List Status: <None>   Medication Order Taking? Sig Documenting Provider Last Dose Status Informant  acetaminophen  (TYLENOL ) 325 MG tablet [id number] Yes Take 2 tablets (650 mg total) by mouth every 6 (six) hours as needed for mild pain (pain score 1-3) or fever (or Fever >/= 101). Josette Ade, MD  Active   aspirin  EC 81 MG tablet [id number] Yes Take 81 mg by mouth daily. Swallow whole. [provider]  Active Family Member  carbidopa -levodopa  (SINEMET  IR) 25-250 MG tablet [id number] Yes Take 1 tablet by mouth 4 (four) times daily. Josette Ade, MD  Active   DULoxetine  (CYMBALTA ) 60 MG capsule  [id number] Yes Take 60 mg by mouth daily.  [provider]  Active Family Member  feeding supplement (ENSURE ENLIVE / ENSURE PLUS) LIQD [id number] Yes Take 237 mLs by mouth 2 (two) times daily between meals. Fausto Burnard LABOR, DO  Active Family Member  lamoTRIgine  (LAMICTAL ) 100 MG tablet [id number] Yes Take 1 tablet by mouth 2 (two) times daily. [provider]  Active Family Member  liver oil-zinc  oxide (DESITIN) 40 % ointment [id number] Yes Apply topically 2 (two) times daily. Josette Ade, MD  Active   morphine  (ROXANOL) 20 MG/ML concentrated solution [id number] Yes Take 0.25 mLs (5 mg total) by mouth every 2 (two) hours as needed for severe pain (pain score 7-10) or shortness of breath (gasping for breath). Josette Ade, MD  Active            Med Note LORICE, CATHLEAN DEL   Wed Jul 28, 2024 11:55 AM) Hasn't started taking but is available  OXYGEN  [id number] Yes Inhale 2 L/min into the lungs at bedtime. [provider]  Active Family Member  QUEtiapine  (SEROQUEL ) 25 MG tablet [id number]  Take 1 tablet (25 mg total) by mouth at bedtime. Josette Ade, MD  Active   sertraline  (ZOLOFT ) 100 MG tablet [id number] Yes Take 200 mg by mouth daily.  [provider]  Active Family Member            Home Care and Equipment/Supplies: Were Home Health Services Ordered?: Yes Name of Home Health Agency:: [facility] Has Agency set up a time to come to your home?: Yes First Home Health Visit Date: 07/30/24 Any new equipment or medical supplies ordered?: NA  Functional Questionnaire: Do you need assistance with bathing/showering or dressing?: Yes Do you need assistance  with meal preparation?: Yes Do you need assistance with eating?: Yes Do you have difficulty maintaining continence: Yes Do you need assistance with getting out of bed/getting out of a chair/moving?: Yes Do you have difficulty managing or taking your medications?: Yes  Follow up  appointments reviewed: PCP Follow-up appointment confirmed?: Yes Date of PCP follow-up appointment?: 08/06/24 Follow-up Provider: Dr Verneita [facility] Follow-up appointment confirmed?: NA Do you need transportation to your follow-up appointment?: No Do you understand care options if your condition(s) worsen?: Yes-patient verbalized understanding (discussed with POA)  SDOH Interventions Today    Flowsheet Row Most Recent Value  SDOH Interventions   Food Insecurity Interventions Intervention Not Indicated  Housing Interventions Intervention Not Indicated  Transportation Interventions Intervention Not Indicated  Utilities Interventions Intervention Not Indicated   POA had several questions. Per POA they had not decided to definitely go with Hospice services. They have her room set up with a bed, hoyer lift, around the clock nurse care, adult briefs, turning pad and everything to meet her personal needs.  POA wants to know what to do if they decline Hospice and she dies.  RN discussed the benefits of hospice able to come out and pronounce the patient. They are there if they need them.  If they decline hospice then she needs to check and see if the RN retired Haematologist she has hired have an active Engineering geologist in [place]. Then a license RN can pronounce a patient deceased. She needs to make sure the physician will sign the death certificate. Then call the funeral home and they will pick the body up from the home.    Cathlean Headland BSN RN [facility] Care Management Coordinator [email or web address] Direct Dial: [phone]  Fax: [phone] Website: [email or web address]

## 2024-08-06 ENCOUNTER — Telehealth (INDEPENDENT_AMBULATORY_CARE_PROVIDER_SITE_OTHER): Admitting: Internal Medicine

## 2024-08-06 ENCOUNTER — Encounter: Payer: Self-pay | Admitting: Internal Medicine

## 2024-08-06 VITALS — BP 124/64 | HR 74 | Temp 97.9°F | Ht 65.0 in | Wt 145.0 lb

## 2024-08-06 DIAGNOSIS — G3183 Dementia with Lewy bodies: Secondary | ICD-10-CM | POA: Diagnosis not present

## 2024-08-06 DIAGNOSIS — F02B3 Dementia in other diseases classified elsewhere, moderate, with mood disturbance: Secondary | ICD-10-CM | POA: Diagnosis not present

## 2024-08-06 DIAGNOSIS — Z515 Encounter for palliative care: Secondary | ICD-10-CM | POA: Diagnosis not present

## 2024-08-06 DIAGNOSIS — Z09 Encounter for follow-up examination after completed treatment for conditions other than malignant neoplasm: Secondary | ICD-10-CM

## 2024-08-06 DIAGNOSIS — Z66 Do not resuscitate: Secondary | ICD-10-CM | POA: Diagnosis not present

## 2024-08-06 NOTE — Progress Notes (Unsigned)
 Virtual Visit via Caregility   Note   This format is felt to be most appropriate for this patient at this time.  All issues noted in this document were discussed and addressed.  No physical exam was performed (except for noted visual exam findings with Video Visits).   I connected with Deborah Velazquez on 08/06/24 at  5:00 PM EDT by a video enabled telemedicine application and verified that I am speaking with the correct person using two identifiers. Location patient: home Location provider: work or home office Persons participating in the virtual visit: patient, provider  I discussed the limitations, risks, security and privacy concerns of performing an evaluation and management service by telephone and the availability of in person appointments. I also discussed with the patient that there may be a patient responsible charge related to this service. The patient expressed understanding and agreed to proceed.  Reason for visit: hospital follow up  HPI:  Deborah Velazquez was recently seen in the ER for the third time and admitted for the second time this summer .  She is  an 78 yr old female with PD , lewy Body disease, presents for HFU after 2nd admission this summer.  Admitted in June with pyelonephritis and sepsis, from Klebsiella and discharged to home. She was admitted again on  August 25 with metabolic encephalopathy and , UTI.   She was treated in house for UTI.  She was verbally unresponsive for several days and discharged home with Hospice on Sept 2  in improved condition  Since coming home she has had  had more good days but continues to require 24 hour care with 2 aides per shift. , DNR is place .She is awake and verbally communicative today and denies pain, shortness of breath, and has a good appetite today    ROS: See pertinent positives and negatives per HPI.  Past Medical History:  Diagnosis Date   Anxiety    Arthritis    Aspiration pneumonia of right lower lobe (HCC) 03/17/2023    Hypercholesterolemia    Hyperlipemia    Irritable bowel syndrome    Parkinson disease (HCC)    Pneumonia due to human metapneumovirus 03/18/2023   Pyelonephritis 05/09/2024   Restless leg syndrome    RSD (reflex sympathetic dystrophy)    pt denies   Sepsis (HCC) 05/08/2024   Sepsis due to urinary tract infection (HCC) 01/10/2024   Stroke (HCC)    TIA's   UTI due to Klebsiella species 05/10/2023    Past Surgical History:  Procedure Laterality Date   APPENDECTOMY     BACK SURGERY     BREAST BIOPSY Left 08/16/2013   neg   CATARACT EXTRACTION Left    CATARACT EXTRACTION W/PHACO Right 06/01/2020   Procedure: CATARACT EXTRACTION PHACO AND INTRAOCULAR LENS PLACEMENT (IOC) RIGHT;  Surgeon: Mittie Gaskin, MD;  Location: [facility] ORS;  Service: Ophthalmology;  Laterality: Right;  cde5.26 us01:05.7 ap8.0   COLONOSCOPY     COLONOSCOPY WITH PROPOFOL  N/A 06/20/2015   Procedure: COLONOSCOPY WITH PROPOFOL ;  Surgeon: Gladis RAYMOND Mariner, MD;  Location: [facility] END[facility]COPY;  Service: Endoscopy;  Laterality: N/A;   EYE SURGERY     Cataract in one eye   FRACTURE SURGERY     wrist - right and right leg. (? if any metal)   HIP ARTHROPLASTY Left 05/08/2023   Procedure: ARTHROPLASTY BIPOLAR HIP (HEMIARTHROPLASTY);  Surgeon: Lorelle Hussar, MD;  Location: [facility] ORS;  Service: Orthopedics;  Laterality: Left;   PARS PLANA VITRECTOMY Left 2010  Family History  Problem Relation Age of Onset   Breast cancer Mother 50   Diabetes Mother    Cancer Mother    Heart disease Mother    Alzheimer's disease Father    Heart disease Maternal Aunt     SOCIAL HX:  reports that she has never smoked. She has never used smokeless tobacco. She reports current alcohol use. She reports that she does not use drugs.    Current Outpatient Medications:    acetaminophen  (TYLENOL ) 325 MG tablet, Take 2 tablets (650 mg total) by mouth every 6 (six) hours as needed for mild pain (pain score 1-3) or fever (or Fever >/= 101).,  Disp: , Rfl:    aspirin  EC 81 MG tablet, Take 81 mg by mouth daily. Swallow whole., Disp: , Rfl:    carbidopa -levodopa  (SINEMET  IR) 25-250 MG tablet, Take 1 tablet by mouth 4 (four) times daily., Disp: 120 tablet, Rfl: 0   DULoxetine  (CYMBALTA ) 60 MG capsule, Take 60 mg by mouth daily. , Disp: , Rfl:    feeding supplement (ENSURE ENLIVE / ENSURE PLUS) LIQD, Take 237 mLs by mouth 2 (two) times daily between meals., Disp: , Rfl:    lamoTRIgine  (LAMICTAL ) 100 MG tablet, Take 1 tablet by mouth 2 (two) times daily., Disp: , Rfl:    liver oil-zinc  oxide (DESITIN) 40 % ointment, Apply topically 2 (two) times daily., Disp: 56.7 g, Rfl: 0   morphine  (ROXANOL) 20 MG/ML concentrated solution, Take 0.25 mLs (5 mg total) by mouth every 2 (two) hours as needed for severe pain (pain score 7-10) or shortness of breath (gasping for breath)., Disp: 15 mL, Rfl: 0   OXYGEN , Inhale 2 L/min into the lungs at bedtime., Disp: , Rfl:    QUEtiapine  (SEROQUEL ) 25 MG tablet, Take 1 tablet (25 mg total) by mouth at bedtime. (Patient taking differently: Take 25 mg by mouth at bedtime. 12.5 mg in the morning and 25 mg at bedtime.), Disp: , Rfl:    sertraline  (ZOLOFT ) 100 MG tablet, Take 200 mg by mouth daily. , Disp: , Rfl:   EXAM:  VITALS per patient if applicable:  GENERAL: alert, oriented, appears well and in no acute distress  HEENT: atraumatic, conjunttiva clear, no obvious abnormalities on inspection of external nose and ears  NECK: normal movements of the head and neck  LUNGS: on inspection no signs of respiratory distress, breathing rate appears normal, no obvious gross SOB, gasping or wheezing  CV: no obvious cyanosis  MS: moves all visible extremities without noticeable abnormality  PSYCH/NEURO: pleasant and cooperative, no obvious depression or anxiety, speech and thought processing grossly intact  ASSESSMENT AND PLAN: Hospital discharge follow-up Assessment & Plan: Patient is stable post discharge and  has no new issues or questions about discharge plans at the visit today for hospital follow up.  I have reviewed the records from the hospital admission in detail with patient today.    End of life care Assessment & Plan: She has been discharged with home hospice and DNR order due to the progressive nature of her disease  and her desire to remain out of the hospital as much as possible   Do not resuscitate status Assessment & Plan: DNR order was signed during last hospitalization .   Moderate Lewy body dementia with mood disturbance (HCC) Assessment & Plan: Her visual hallucinations have beem reduced with use of lamictal  and seroquel        I discussed the assessment and treatment plan with the patient. The  patient was provided an opportunity to ask questions and all were answered. The patient agreed with the plan and demonstrated an understanding of the instructions.   The patient was advised to call back or seek an in-person evaluation if the symptoms worsen or if the condition fails to improve as anticipated.   I spent 30 minutes dedicated to the care of this patient on the date of this encounter to include pre-visit review of patient's medical history,  including recent ER visit, imaging studies and labs, face-to-face time with the patient , and post visit ordering of testing and therapeutics.    Verneita LITTIE Kettering, MD

## 2024-08-08 ENCOUNTER — Encounter: Payer: Self-pay | Admitting: Internal Medicine

## 2024-08-08 DIAGNOSIS — Z66 Do not resuscitate: Secondary | ICD-10-CM | POA: Insufficient documentation

## 2024-08-08 DIAGNOSIS — Z09 Encounter for follow-up examination after completed treatment for conditions other than malignant neoplasm: Secondary | ICD-10-CM | POA: Insufficient documentation

## 2024-08-08 NOTE — Assessment & Plan Note (Signed)
Patient is stable post discharge and has no new issues or questions about discharge plans at the visit today for hospital follow up.  I have reviewed the records from the hospital admission in detail with patient today. 

## 2024-08-08 NOTE — Assessment & Plan Note (Signed)
 DNR order was signed during last hospitalization .

## 2024-08-08 NOTE — Assessment & Plan Note (Addendum)
 Her visual hallucinations have beem reduced with use of lamictal  and seroquel 

## 2024-08-08 NOTE — Assessment & Plan Note (Signed)
 She has been discharged with home hospice and DNR order due to the progressive nature of her disease  and her desire to remain out of the hospital as much as possible

## 2024-08-15 DIAGNOSIS — G20A1 Parkinson's disease without dyskinesia, without mention of fluctuations: Secondary | ICD-10-CM | POA: Diagnosis not present

## 2024-08-15 DIAGNOSIS — J9601 Acute respiratory failure with hypoxia: Secondary | ICD-10-CM | POA: Diagnosis not present

## 2024-09-08 DIAGNOSIS — R569 Unspecified convulsions: Secondary | ICD-10-CM | POA: Diagnosis not present

## 2024-09-08 DIAGNOSIS — G4752 REM sleep behavior disorder: Secondary | ICD-10-CM | POA: Diagnosis not present

## 2024-09-08 DIAGNOSIS — G20A1 Parkinson's disease without dyskinesia, without mention of fluctuations: Secondary | ICD-10-CM | POA: Diagnosis not present

## 2024-09-14 DIAGNOSIS — J9601 Acute respiratory failure with hypoxia: Secondary | ICD-10-CM | POA: Diagnosis not present

## 2024-09-14 DIAGNOSIS — G20A1 Parkinson's disease without dyskinesia, without mention of fluctuations: Secondary | ICD-10-CM | POA: Diagnosis not present

## 2024-10-15 DIAGNOSIS — J9601 Acute respiratory failure with hypoxia: Secondary | ICD-10-CM | POA: Diagnosis not present

## 2024-10-15 DIAGNOSIS — G20A1 Parkinson's disease without dyskinesia, without mention of fluctuations: Secondary | ICD-10-CM | POA: Diagnosis not present

## 2024-11-15 ENCOUNTER — Telehealth: Payer: Self-pay

## 2024-11-15 DIAGNOSIS — N390 Urinary tract infection, site not specified: Secondary | ICD-10-CM

## 2024-11-15 DIAGNOSIS — Z0000 Encounter for general adult medical examination without abnormal findings: Secondary | ICD-10-CM

## 2024-11-15 NOTE — Telephone Encounter (Signed)
 How long has the family noted change in urination? We need to get urine and urine culture given her history of previous urinary tract infection.  In regards to oxygen  use, I would recommend patient use oxygen  and schedule a follow up appointment with Dr. Marylynn to discuss chronic conditions.   Dr. Coolidge (I am covering for my colleague Dr. Marylynn).  Luke Shade, MD

## 2024-11-15 NOTE — Progress Notes (Signed)
 "  No chief complaint on file.    Subjective:   Deborah Velazquez is a 78 y.o. female who presents for a Medicare Annual Wellness Visit.  Visit info / Clinical Intake: Medicare Wellness Visit Type:: Subsequent Annual Wellness Visit Persons participating in visit and providing information:: caregiver (with patient present) Deborah Velazquez/ Deborah Velazquez) Medicare Wellness Visit Mode:: Telephone If telephone:: video declined Since this visit was completed virtually, some vitals may be partially provided or unavailable. Missing vitals are due to the limitations of the virtual format.: Unable to obtain vitals - no equipment If Telephone or Video please confirm:: I connected with patient using audio/video enable telemedicine. I verified patient identity with two identifiers, discussed telehealth limitations, and patient agreed to proceed. Patient Location:: home Provider Location:: home Interpreter Needed?: No Pre-visit prep was completed: yes AWV questionnaire completed by patient prior to visit?: no Living arrangements:: with family/others Patient's Overall Health Status Rating: good Typical amount of pain: none Does pain affect daily life?: no Are you currently prescribed opioids?: (!) yes (Not taking the morphine  at this time)  Dietary Habits and Nutritional Risks How many meals a day?: 3 (supplement with ensures/snacks-varies on some days) Eats fruit and vegetables daily?: yes Most meals are obtained by: having others provide food In the last 2 weeks, have you had any of the following?: none Diabetic:: no  Functional Status Activities of Daily Living (to include ambulation/medication): (!) Dependent Feeding: Needs assistance Dressing/Grooming: Dependent Bathing: Dependent Toileting: Dependent Transfer: Dependent Ambulation: Dependent Medication Administration: Dependent Is this a change from baseline?: Pre-admission baseline Home Management (perform basic housework or laundry):  Dependent Manage your own finances?: (!) no Primary transportation is: family / friends Concerns about vision?: no *vision screening is required for WTM* Concerns about hearing?: no  Fall Screening Falls in the past year?: 0 Number of falls in past year: 0 Was there an injury with Fall?: 0 Fall Risk Category Calculator: 0 Patient Fall Risk Level: Low Fall Risk  Fall Risk Patient at Risk for Falls Due to: No Fall Risks Fall risk Follow up: Falls evaluation completed; Education provided; Falls prevention discussed  Home and Transportation Safety: All rugs have non-skid backing?: N/A, no rugs All stairs or steps have railings?: N/A, no stairs Grab bars in the bathtub or shower?: yes Have non-skid surface in bathtub or shower?: yes Good home lighting?: yes Regular seat belt use?: yes Hospital stays in the last year:: (!) yes How many hospital stays:: 3 Reason: sepsis, uti, uti  Cognitive Assessment Difficulty concentrating, remembering, or making decisions? : yes Will 6CIT or Mini Cog be Completed: no 6CIT or Mini Cog Declined: patient has a diagnosis of dementia or cognitive impairment  Advance Directives (For Healthcare) Does Patient Have a Medical Advance Directive?: Yes Does patient want to make changes to medical advance directive?: No - Guardian declined Type of Advance Directive: Healthcare Power of [place]; Living will; Out of facility DNR (pink MOST or yellow form) Copy of Healthcare Power of Attorney in Chart?: Yes - validated most recent copy scanned in chart (See row information) Copy of Living Will in Chart?: Yes - validated most recent copy scanned in chart (See row information) Out of facility DNR (pink MOST or yellow form) in Chart? (Ambulatory ONLY): Yes - validated most recent copy scanned in chart Would patient like information on creating a medical advance directive?: No - Guardian declined  Reviewed/Updated  Reviewed/Updated: Reviewed All (Medical,  Surgical, Family, Medications, Allergies, Care Teams, Patient Goals)  Allergies (verified) Pseudoephedrine hcl and Sudafed [pseudoephedrine]   Current Medications (verified) Outpatient Encounter Medications as of 11/15/2024  Medication Sig   acetaminophen  (TYLENOL ) 325 MG tablet Take 2 tablets (650 mg total) by mouth every 6 (six) hours as needed for mild pain (pain score 1-3) or fever (or Fever >/= 101).   atorvastatin  (LIPITOR) 10 MG tablet Take 10 mg by mouth daily.   carbidopa -levodopa  (SINEMET  IR) 25-250 MG tablet Take 1 tablet by mouth 4 (four) times daily.   DULoxetine  (CYMBALTA ) 60 MG capsule Take 60 mg by mouth daily.    feeding supplement (ENSURE ENLIVE / ENSURE PLUS) LIQD Take 237 mLs by mouth 2 (two) times daily between meals.   lamoTRIgine  (LAMICTAL ) 100 MG tablet Take 1 tablet by mouth 2 (two) times daily.   liver oil-zinc  oxide (DESITIN) 40 % ointment Apply topically 2 (two) times daily.   QUEtiapine  (SEROQUEL ) 25 MG tablet Take 1 tablet (25 mg total) by mouth at bedtime. (Patient taking differently: 25 mg in am and 50 mg in pm)   sertraline  (ZOLOFT ) 100 MG tablet Take 200 mg by mouth daily.    aspirin  EC 81 MG tablet Take 81 mg by mouth daily. Swallow whole.   morphine  (ROXANOL) 20 MG/ML concentrated solution Take 0.25 mLs (5 mg total) by mouth every 2 (two) hours as needed for severe pain (pain score 7-10) or shortness of breath (gasping for breath). (Patient not taking: Reported on 11/15/2024)   OXYGEN  Inhale 2 L/min into the lungs at bedtime. (Patient not taking: Reported on 11/15/2024)   No facility-administered encounter medications on file as of 11/15/2024.    History: Past Medical History:  Diagnosis Date   Anxiety    Arthritis    Aspiration pneumonia of right lower lobe (HCC) 03/17/2023   Hypercholesterolemia    Hyperlipemia    Irritable bowel syndrome    Parkinson disease (HCC)    Pneumonia due to human metapneumovirus 03/18/2023   Pyelonephritis  05/09/2024   Restless leg syndrome    RSD (reflex sympathetic dystrophy)    pt denies   Sepsis (HCC) 05/08/2024   Sepsis due to urinary tract infection (HCC) 01/10/2024   Stroke (HCC)    TIA's   UTI due to Klebsiella species 05/10/2023   Past Surgical History:  Procedure Laterality Date   APPENDECTOMY     BACK SURGERY     BREAST BIOPSY Left 08/16/2013   neg   CATARACT EXTRACTION Left    CATARACT EXTRACTION W/PHACO Right 06/01/2020   Procedure: CATARACT EXTRACTION PHACO AND INTRAOCULAR LENS PLACEMENT (IOC) RIGHT;  Surgeon: Mittie Gaskin, MD;  Location: [facility] ORS;  Service: Ophthalmology;  Laterality: Right;  [id number] ap8.0   COLONOSCOPY     COLONOSCOPY WITH PROPOFOL  N/A 06/20/2015   Procedure: COLONOSCOPY WITH PROPOFOL ;  Surgeon: Gladis RAYMOND Mariner, MD;  Location[facility] ENDOSCOPY;  Service: Endoscopy;  Laterality: N/A;   EYE SURGERY     Cataract in one eye   FRACTURE SURGERY     wrist - right and right leg. (? if any metal)   HIP ARTHROPLASTY Left 05/08/2023   Procedure: ARTHROPLASTY BIPOLAR HIP (HEMIARTHROPLASTY);  Surgeon: Lorelle Hussar, MD;  Location: [facility] ORS;  Service: Orthopedics;  Laterality: Left;   PARS PLANA VITRECTOMY Left 2010   Family History  Problem Relation Age of Onset   Breast cancer Mother 47   Diabetes Mother    Cancer Mother    Heart disease Mother    Alzheimer's disease Father    Heart disease  Maternal Aunt    Social History   Occupational History   Occupation: retired    Comment: airline pilot  Tobacco Use   Smoking status: Never   Smokeless tobacco: Never  Vaping Use   Vaping status: Never Used  Substance and Sexual Activity   Alcohol use: Yes    Comment: rare   Drug use: No   Sexual activity: Not Currently   Tobacco Counseling Counseling given: Not Answered  SDOH Screenings   Food Insecurity: No Food Insecurity (11/15/2024)  Housing: Low Risk (11/15/2024)  Transportation Needs: No Transportation Needs (11/15/2024)  Utilities:  Not At Risk (11/15/2024)  Alcohol Screen: Low Risk (11/11/2023)  Depression (PHQ2-9): High Risk (08/06/2024)  Financial Resource Strain: Low Risk (11/11/2023)  Physical Activity: Inactive (11/15/2024)  Social Connections: Patient Unable To Answer (11/15/2024)  Stress: Patient Unable To Answer (11/15/2024)  Tobacco Use: Low Risk  (11/03/2024)   Received from [facility]  Health Literacy: Inadequate Health Literacy (11/15/2024)   See flowsheets for full screening details  Depression Screen Depression Screening Exception Documentation Depression Screening Exception:: Other- indicate reason in comment box Depression Screening Exception Comment:: unable to asses due to dementia  PHQ 2 & 9 Depression Scale- Over the past 2 weeks, how often have you been bothered by any of the following problems? Little interest or pleasure in doing things: -- (dementia) Feeling down, depressed, or hopeless (PHQ Adolescent also includes...irritable): 2 PHQ-2 Total Score: 4 Trouble falling or staying asleep, or sleeping too much: 3 Feeling tired or having little energy: 3 Poor appetite or overeating (PHQ Adolescent also includes...weight loss): 0 Feeling bad about yourself - or that you are a failure or have let yourself or your family down: 1 Trouble concentrating on things, such as reading the newspaper or watching television (PHQ Adolescent also includes...like school work): 1 Moving or speaking so slowly that other people could have noticed. Or the opposite - being so fidgety or restless that you have been moving around a lot more than usual: 2 Thoughts that you would be better off dead, or of hurting yourself in some way: 0 PHQ-9 Total Score: 14 If you checked off any problems, how difficult have these problems made it for you to do your work, take care of things at home, or get along with other people?: Not difficult at all  Depression Treatment Depression Interventions/Treatment :  Currently on Treatment     Goals Addressed   None          Objective:    There were no vitals filed for this visit. There is no height or weight on file to calculate BMI.  Hearing/Vision screen No results found. Immunizations and Health Maintenance Health Maintenance  Topic Date Due   Zoster Vaccines- Shingrix (1 of 2) Never done   Bone Density Scan  Never done   Pneumococcal Vaccine: 50+ Years (2 of 2 - PCV20 or PCV21) 07/26/2016   Mammogram  12/24/2017   COVID-19 Vaccine (3 - Moderna risk series) 03/18/2020   DTaP/Tdap/Td (2 - Td or Tdap) 06/24/2024   Influenza Vaccine  02/22/2025 (Originally 06/25/2024)   Meningococcal B Vaccine  Aged Out   Colonoscopy  Discontinued        Assessment/Plan:  This is a routine wellness examination for Deborah Velazquez.  Patient Care Team: Marylynn Verneita CROME, MD as PCP - General (Internal Medicine) Ermalinda Lenn HERO, [place] as Social Worker Maree Jannett POUR, MD as Consulting Physician (Neurology) Chipper Viviane POUR, MD as Referring Physician (Psychiatry)  I have personally reviewed and noted the following in the patients chart:   Medical and social history Use of alcohol, tobacco or illicit drugs  Current medications and supplements including opioid prescriptions. Functional ability and status Nutritional status Physical activity Advanced directives List of other physicians Hospitalizations, surgeries, and ER visits in previous 12 months Vitals Screenings to include cognitive, depression, and falls Referrals and appointments  No orders of the defined types were placed in this encounter.  In addition, I have reviewed and discussed with patient certain preventive protocols, quality metrics, and best practice recommendations. A written personalized care plan for preventive services as well as general preventive health recommendations were provided to patient.   Arnette LOISE Hoots, CMA   11/15/2024   No follow-ups on file.  After Visit Summary:  (Declined) Due to this being a telephonic visit, with patients personalized plan was offered to patient but patient Declined AVS at this time   Nurse Notes: Patient is not doing well in a mental capacity. She is about 95% bed ridden. Visit done with her two caretakers. Patient was present but unable to answer questions. She is no longer using oxygen  O2 is at 92% most of the day. They would like ot know if it can be returned message sent to provider.  "

## 2024-11-15 NOTE — Patient Instructions (Signed)
 Deborah Velazquez,  Thank you for taking the time for your Medicare Wellness Visit. I appreciate your continued commitment to your health goals. Please review the care plan we discussed, and feel free to reach out if I can assist you further.  Please note that Annual Wellness Visits do not include a physical exam. Some assessments may be limited, especially if the visit was conducted virtually. If needed, we may recommend an in-person follow-up with your provider.  Ongoing Care Seeing your primary care provider every 3 to 6 months helps us  monitor your health and provide consistent, personalized care.   Referrals If a referral was made during today's visit and you haven't received any updates within two weeks, please contact the referred provider directly to check on the status.  Recommended Screenings:  Health Maintenance  Topic Date Due   Zoster (Shingles) Vaccine (1 of 2) Never done   Osteoporosis screening with Bone Density Scan  Never done   Pneumococcal Vaccine for age over 67 (2 of 2 - PCV20 or PCV21) 07/26/2016   Breast Cancer Screening  12/24/2017   COVID-19 Vaccine (3 - Moderna risk series) 03/18/2020   DTaP/Tdap/Td vaccine (2 - Td or Tdap) 06/24/2024   Flu Shot  02/22/2025*   Meningitis B Vaccine  Aged Out   Colon Cancer Screening  Discontinued  *Topic was postponed. The date shown is not the original due date.       11/15/2024   10:15 AM  Advanced Directives  Does Patient Have a Medical Advance Directive? Yes  Type of Estate Agent of [place];Living will;Out of facility DNR (pink MOST or yellow form)  Does patient want to make changes to medical advance directive? No - Guardian declined  Copy of Healthcare Power of Attorney in Chart? Yes - validated most recent copy scanned in chart (See row information)    Vision: Annual vision screenings are recommended for early detection of glaucoma, cataracts, and diabetic retinopathy. These exams can also reveal  signs of chronic conditions such as diabetes and high blood pressure.  Dental: Annual dental screenings help detect early signs of oral cancer, gum disease, and other conditions linked to overall health, including heart disease and diabetes.  Please see the attached documents for additional preventive care recommendations.

## 2024-11-15 NOTE — Telephone Encounter (Signed)
Message has been sent to patient via mychart  

## 2024-11-15 NOTE — Telephone Encounter (Signed)
 Patients caregivers state that the patient is no longer using her oxygen . Her O2 stays above 92% and they would like an order to return if ok. They also state that the patient has dark urine and a foul smell but no fever at this time. They are worried about a UTI but unable to bring in urine. Please advise.

## 2024-11-16 NOTE — Telephone Encounter (Signed)
 Attempted to call Linda(HCPOA). No answer. Mailbox full.

## 2024-11-16 NOTE — Telephone Encounter (Signed)
 I signed lab order for urine. If patient's current status is changed from her baseline, unable to provide urine sample, fever, nausea, vomiting, abdominal, back pain, I recommend ED evaluation as well.  Luke Shade, MD

## 2024-11-16 NOTE — Telephone Encounter (Signed)
 Follow up from yesterday note:    Spoke with Mrs. Wise(HCPOA). She stated that pt has been in one of her deep sleeps that you can't wake her up from since yesterday afternoon so she is not sure if she will be able to collect a urine specimen. She stated if they do get one it will probably later in the week and hopefully be able to drop one off on Friday. [place] is also aware that they will need to schedule an appointment to discuss the need for the letter either discontinue or continue with the oxygen . She stated that they don't use the O2 often at all. The only time they ever have to use the O2 is when she get a bad UTI and ends up in the hospital so they are sure if they need to keep it on hand or return it to [facility].    Orders have been placed incase they are able to drop off a urine sample.

## 2024-11-16 NOTE — Addendum Note (Signed)
 Addended by: HARRIETTE RAISIN on: 11/16/2024 08:57 AM   Modules accepted: Orders

## 2024-11-22 NOTE — Telephone Encounter (Signed)
 See mychart message that was routed to Dr. Marylynn.
